# Patient Record
Sex: Male | Born: 1935 | Race: White | Hispanic: No | Smoking: Former smoker
Health system: Southern US, Community
[De-identification: ages and names within clinical notes are randomized; demographics above are authoritative.]

## PROBLEM LIST (undated history)

## (undated) DIAGNOSIS — R3129 Other microscopic hematuria: Secondary | ICD-10-CM

## (undated) DIAGNOSIS — B351 Tinea unguium: Secondary | ICD-10-CM

## (undated) DIAGNOSIS — N402 Nodular prostate without lower urinary tract symptoms: Secondary | ICD-10-CM

## (undated) DIAGNOSIS — N390 Urinary tract infection, site not specified: Secondary | ICD-10-CM

## (undated) DIAGNOSIS — I471 Supraventricular tachycardia, unspecified: Secondary | ICD-10-CM

## (undated) DIAGNOSIS — J189 Pneumonia, unspecified organism: Secondary | ICD-10-CM

## (undated) DIAGNOSIS — M1990 Unspecified osteoarthritis, unspecified site: Secondary | ICD-10-CM

## (undated) DIAGNOSIS — T300 Burn of unspecified body region, unspecified degree: Secondary | ICD-10-CM

## (undated) DIAGNOSIS — H16139 Photokeratitis, unspecified eye: Secondary | ICD-10-CM

## (undated) DIAGNOSIS — E538 Deficiency of other specified B group vitamins: Secondary | ICD-10-CM

## (undated) DIAGNOSIS — I739 Peripheral vascular disease, unspecified: Secondary | ICD-10-CM

## (undated) DIAGNOSIS — K5790 Diverticulosis of intestine, part unspecified, without perforation or abscess without bleeding: Secondary | ICD-10-CM

## (undated) DIAGNOSIS — N289 Disorder of kidney and ureter, unspecified: Secondary | ICD-10-CM

## (undated) DIAGNOSIS — I10 Essential (primary) hypertension: Secondary | ICD-10-CM

## (undated) DIAGNOSIS — I509 Heart failure, unspecified: Secondary | ICD-10-CM

## (undated) DIAGNOSIS — I2510 Atherosclerotic heart disease of native coronary artery without angina pectoris: Secondary | ICD-10-CM

## (undated) DIAGNOSIS — E559 Vitamin D deficiency, unspecified: Secondary | ICD-10-CM

## (undated) HISTORY — DX: Vitamin D deficiency, unspecified: E55.9

## (undated) HISTORY — PX: CORONARY ARTERY BYPASS GRAFT: SHX141

## (undated) HISTORY — DX: Nodular prostate without lower urinary tract symptoms: N40.2

## (undated) HISTORY — DX: Deficiency of other specified B group vitamins: E53.8

## (undated) HISTORY — DX: Supraventricular tachycardia, unspecified: I47.10

## (undated) HISTORY — DX: Diverticulosis of intestine, part unspecified, without perforation or abscess without bleeding: K57.90

## (undated) HISTORY — DX: Other microscopic hematuria: R31.29

## (undated) HISTORY — DX: Urinary tract infection, site not specified: N39.0

## (undated) HISTORY — PX: APPENDECTOMY: SHX54

## (undated) HISTORY — DX: Supraventricular tachycardia: I47.1

## (undated) HISTORY — DX: Tinea unguium: B35.1

## (undated) HISTORY — DX: Photokeratitis, unspecified eye: H16.139

## (undated) HISTORY — DX: Pneumonia, unspecified organism: J18.9

## (undated) HISTORY — DX: Peripheral vascular disease, unspecified: I73.9

## (undated) HISTORY — DX: Unspecified osteoarthritis, unspecified site: M19.90

---

## 1998-02-26 ENCOUNTER — Encounter (HOSPITAL_COMMUNITY): Admission: RE | Admit: 1998-02-26 | Discharge: 1998-05-27 | Payer: Self-pay | Admitting: Interventional Cardiology

## 2000-12-15 ENCOUNTER — Ambulatory Visit (HOSPITAL_COMMUNITY): Admission: RE | Admit: 2000-12-15 | Discharge: 2000-12-15 | Payer: Self-pay | Admitting: Interventional Cardiology

## 2001-03-24 ENCOUNTER — Ambulatory Visit (HOSPITAL_COMMUNITY): Admission: RE | Admit: 2001-03-24 | Discharge: 2001-03-24 | Payer: Self-pay | Admitting: Gastroenterology

## 2002-08-09 ENCOUNTER — Encounter: Payer: Self-pay | Admitting: Emergency Medicine

## 2002-08-09 ENCOUNTER — Emergency Department (HOSPITAL_COMMUNITY): Admission: EM | Admit: 2002-08-09 | Discharge: 2002-08-09 | Payer: Self-pay | Admitting: Emergency Medicine

## 2003-09-02 ENCOUNTER — Emergency Department (HOSPITAL_COMMUNITY): Admission: EM | Admit: 2003-09-02 | Discharge: 2003-09-02 | Payer: Self-pay | Admitting: Emergency Medicine

## 2004-09-15 ENCOUNTER — Emergency Department (HOSPITAL_COMMUNITY): Admission: EM | Admit: 2004-09-15 | Discharge: 2004-09-15 | Payer: Self-pay | Admitting: Family Medicine

## 2004-09-15 ENCOUNTER — Encounter (INDEPENDENT_AMBULATORY_CARE_PROVIDER_SITE_OTHER): Payer: Self-pay | Admitting: Specialist

## 2004-09-29 ENCOUNTER — Emergency Department (HOSPITAL_COMMUNITY): Admission: EM | Admit: 2004-09-29 | Discharge: 2004-09-29 | Payer: Self-pay | Admitting: Family Medicine

## 2005-05-28 ENCOUNTER — Emergency Department (HOSPITAL_COMMUNITY): Admission: EM | Admit: 2005-05-28 | Discharge: 2005-05-28 | Payer: Self-pay | Admitting: Emergency Medicine

## 2008-06-20 ENCOUNTER — Emergency Department (HOSPITAL_COMMUNITY): Admission: EM | Admit: 2008-06-20 | Discharge: 2008-06-20 | Payer: Self-pay | Admitting: Emergency Medicine

## 2008-06-28 ENCOUNTER — Ambulatory Visit (HOSPITAL_COMMUNITY): Admission: RE | Admit: 2008-06-28 | Discharge: 2008-06-28 | Payer: Self-pay | Admitting: Urology

## 2009-12-31 ENCOUNTER — Inpatient Hospital Stay (HOSPITAL_COMMUNITY): Admission: EM | Admit: 2009-12-31 | Discharge: 2010-01-03 | Payer: Self-pay | Admitting: Emergency Medicine

## 2010-01-01 ENCOUNTER — Encounter (INDEPENDENT_AMBULATORY_CARE_PROVIDER_SITE_OTHER): Payer: Self-pay | Admitting: Interventional Cardiology

## 2011-02-04 LAB — POCT I-STAT 3, VENOUS BLOOD GAS (G3P V)
Bicarbonate: 26.2 mEq/L — ABNORMAL HIGH (ref 20.0–24.0)
TCO2: 28 mmol/L (ref 0–100)
pCO2, Ven: 45.8 mmHg (ref 45.0–50.0)
pH, Ven: 7.364 — ABNORMAL HIGH (ref 7.250–7.300)

## 2011-02-04 LAB — COMPREHENSIVE METABOLIC PANEL
ALT: 27 U/L (ref 0–53)
ALT: 39 U/L (ref 0–53)
AST: 61 U/L — ABNORMAL HIGH (ref 0–37)
Albumin: 3 g/dL — ABNORMAL LOW (ref 3.5–5.2)
Albumin: 3.4 g/dL — ABNORMAL LOW (ref 3.5–5.2)
Alkaline Phosphatase: 44 U/L (ref 39–117)
Alkaline Phosphatase: 50 U/L (ref 39–117)
BUN: 19 mg/dL (ref 6–23)
Chloride: 104 mEq/L (ref 96–112)
Chloride: 104 mEq/L (ref 96–112)
Glucose, Bld: 95 mg/dL (ref 70–99)
Potassium: 3.1 mEq/L — ABNORMAL LOW (ref 3.5–5.1)
Potassium: 3.5 mEq/L (ref 3.5–5.1)
Sodium: 137 mEq/L (ref 135–145)
Sodium: 140 mEq/L (ref 135–145)
Total Bilirubin: 0.9 mg/dL (ref 0.3–1.2)
Total Bilirubin: 0.9 mg/dL (ref 0.3–1.2)
Total Protein: 5.7 g/dL — ABNORMAL LOW (ref 6.0–8.3)
Total Protein: 6.5 g/dL (ref 6.0–8.3)

## 2011-02-04 LAB — GLUCOSE, CAPILLARY
Glucose-Capillary: 115 mg/dL — ABNORMAL HIGH (ref 70–99)
Glucose-Capillary: 124 mg/dL — ABNORMAL HIGH (ref 70–99)
Glucose-Capillary: 127 mg/dL — ABNORMAL HIGH (ref 70–99)
Glucose-Capillary: 136 mg/dL — ABNORMAL HIGH (ref 70–99)
Glucose-Capillary: 146 mg/dL — ABNORMAL HIGH (ref 70–99)
Glucose-Capillary: 162 mg/dL — ABNORMAL HIGH (ref 70–99)
Glucose-Capillary: 99 mg/dL (ref 70–99)

## 2011-02-04 LAB — POCT I-STAT 3, ART BLOOD GAS (G3+)
Acid-Base Excess: 5 mmol/L — ABNORMAL HIGH (ref 0.0–2.0)
Bicarbonate: 30 mEq/L — ABNORMAL HIGH (ref 20.0–24.0)
O2 Saturation: 90 %
TCO2: 31 mmol/L (ref 0–100)
pCO2 arterial: 37.1 mmHg (ref 35.0–45.0)
pO2, Arterial: 57 mmHg — ABNORMAL LOW (ref 80.0–100.0)
pO2, Arterial: 81 mmHg (ref 80.0–100.0)

## 2011-02-04 LAB — CBC
HCT: 31.2 % — ABNORMAL LOW (ref 39.0–52.0)
HCT: 33.7 % — ABNORMAL LOW (ref 39.0–52.0)
HCT: 34.3 % — ABNORMAL LOW (ref 39.0–52.0)
Hemoglobin: 10.8 g/dL — ABNORMAL LOW (ref 13.0–17.0)
MCHC: 34.6 g/dL (ref 30.0–36.0)
MCV: 91.4 fL (ref 78.0–100.0)
Platelets: 176 10*3/uL (ref 150–400)
Platelets: 191 10*3/uL (ref 150–400)
RBC: 3.44 MIL/uL — ABNORMAL LOW (ref 4.22–5.81)
RDW: 13.5 % (ref 11.5–15.5)
WBC: 7.2 10*3/uL (ref 4.0–10.5)
WBC: 8.8 10*3/uL (ref 4.0–10.5)

## 2011-02-04 LAB — DIFFERENTIAL
Basophils Absolute: 0.1 10*3/uL (ref 0.0–0.1)
Basophils Relative: 1 % (ref 0–1)
Basophils Relative: 1 % (ref 0–1)
Eosinophils Absolute: 0 10*3/uL (ref 0.0–0.7)
Eosinophils Absolute: 0.1 10*3/uL (ref 0.0–0.7)
Eosinophils Relative: 1 % (ref 0–5)
Monocytes Absolute: 0.6 10*3/uL (ref 0.1–1.0)
Monocytes Absolute: 0.9 10*3/uL (ref 0.1–1.0)
Monocytes Relative: 12 % (ref 3–12)
Monocytes Relative: 8 % (ref 3–12)

## 2011-02-04 LAB — CK TOTAL AND CKMB (NOT AT ARMC)
CK, MB: 1 ng/mL (ref 0.3–4.0)
CK, MB: 1.3 ng/mL (ref 0.3–4.0)
Relative Index: INVALID (ref 0.0–2.5)
Total CK: 36 U/L (ref 7–232)
Total CK: 47 U/L (ref 7–232)

## 2011-02-04 LAB — LIPID PANEL
HDL: 44 mg/dL (ref >39)
Total CHOL/HDL Ratio: 2.5 RATIO
Triglycerides: 107 mg/dL (ref <150)
VLDL: 21 mg/dL (ref 0–40)

## 2011-02-04 LAB — BASIC METABOLIC PANEL WITH GFR
BUN: 21 mg/dL (ref 6–23)
CO2: 30 meq/L (ref 19–32)
Calcium: 8.9 mg/dL (ref 8.4–10.5)
Chloride: 104 meq/L (ref 96–112)
Creatinine, Ser: 1.03 mg/dL (ref 0.4–1.5)
GFR calc non Af Amer: >60 mL/min
Glucose, Bld: 122 mg/dL — ABNORMAL HIGH (ref 70–99)
Potassium: 4.7 meq/L (ref 3.5–5.1)
Sodium: 139 meq/L (ref 135–145)

## 2011-02-04 LAB — TROPONIN I: Troponin I: 0.02 ng/mL (ref 0.00–0.06)

## 2011-02-04 LAB — POCT CARDIAC MARKERS: CKMB, poc: <1 ng/mL — ABNORMAL LOW (ref 1.0–8.0)

## 2011-02-04 LAB — BASIC METABOLIC PANEL
BUN: 18 mg/dL (ref 6–23)
Chloride: 104 mEq/L (ref 96–112)
Glucose, Bld: 125 mg/dL — ABNORMAL HIGH (ref 70–99)
Potassium: 4.1 mEq/L (ref 3.5–5.1)

## 2011-02-04 LAB — BRAIN NATRIURETIC PEPTIDE: Pro B Natriuretic peptide (BNP): 438 pg/mL — ABNORMAL HIGH (ref 0.0–100.0)

## 2011-02-04 LAB — HEMOGLOBIN A1C: Hgb A1c MFr Bld: 6.5 % — ABNORMAL HIGH (ref 4.6–6.1)

## 2011-02-04 LAB — TSH: TSH: 1.177 u[IU]/mL (ref 0.350–4.500)

## 2011-04-03 NOTE — Procedures (Signed)
[place]  Patient:    Cole [name], Cole [name]                          MRN: [id number] Proc. Date: 03/24/01 Attending:  Mickeal Skinner, M.D. CC:         Henrine Screws, M.D., [facility]   Procedure Report  REFERRING PHYSICIAN:  Henrine Screws, M.D.  PROCEDURE:  Colonoscopy and polypectomy.  INDICATION:  Cole [name] (date of birth [birth year]) is a 77 year old  male.  He submitted six stool samples for hemoccult testing; two of six stool slides were positive for blood.  I discussed with Mr. Nivens the complications associated with colonoscopy and polypectomy including a 1:1000 risk of bleeding and 4:1000 risk of colon rupture requiring emergency surgery.  Mr. Vallier has signed the operative permit.  ENDOSCOPIST:  Mickeal Skinner, M.D.  PREMEDICATION:  Fentanyl 50 mg, Versed 5 mg.  ENDOSCOPE:  Olympus pediatric colonoscope.  DESCRIPTION OF PROCEDURE:  After obtaining informed consent, the patient was placed in the left lateral decubitus position.  I administered intravenous fentanyl and intravenous Versed to achieve conscious sedation for the procedure.  The patients blood pressure, oxygen saturation, and cardiac rhythm were monitored throughout the procedure and documented in the medical record.  Anal inspection was normal.  Digital rectal exam revealed a non-nodular prostate.  The Olympus pediatric colonoscope was introduced into the rectum and, under direct vision, advanced to the cecum.  Colonic preparation for the exam today was excellent.  Rectum: Normal.  Sigmoid Colon and Descending Colon: Left colonic diverticulosis.  Splenic Flexure: Normal.  Transverse Colon: Normal.  Hepatic Flexure: Normal.  Ascending Colon: Normal.  Cecum and Ileocecal Valve: Normal.  ASSESSMENT: 1. Left colonic diverticulosis. 2. Otherwise normal proctocolonoscopy to the cecum. DD:  03/24/01 TD:   03/24/01 Job: [id number]

## 2011-08-14 LAB — DIFFERENTIAL
Basophils Absolute: 0
Basophils Relative: 0
Eosinophils Absolute: 0.1
Eosinophils Relative: 0
Lymphocytes Relative: 6 — ABNORMAL LOW
Lymphs Abs: 0.9
Monocytes Absolute: 0.5
Monocytes Relative: 3
Neutro Abs: 14.9 — ABNORMAL HIGH
Neutrophils Relative %: 91 — ABNORMAL HIGH

## 2011-08-14 LAB — COMPREHENSIVE METABOLIC PANEL WITH GFR
ALT: 15
AST: 19
Albumin: 3.7
Alkaline Phosphatase: 45
Chloride: 102
GFR calc Af Amer: >60
Potassium: 3.9
Sodium: 137
Total Protein: 7.1

## 2011-08-14 LAB — URINALYSIS, ROUTINE W REFLEX MICROSCOPIC
Bilirubin Urine: NEGATIVE
Glucose, UA: 100 — AB
Ketones, ur: NEGATIVE
Leukocytes, UA: NEGATIVE
Nitrite: NEGATIVE
Protein, ur: NEGATIVE
Specific Gravity, Urine: 1.016
Urobilinogen, UA: 1
pH: 6.5

## 2011-08-14 LAB — COMPREHENSIVE METABOLIC PANEL
BUN: 21
CO2: 25
Calcium: 9
Creatinine, Ser: 0.97
GFR calc non Af Amer: >60
Glucose, Bld: 171 — ABNORMAL HIGH
Total Bilirubin: 1.1

## 2011-08-14 LAB — SAMPLE TO BLOOD BANK

## 2011-08-14 LAB — CBC
HCT: 39.9
Hemoglobin: 13.4
MCHC: 33.6
MCV: 89
Platelets: 206
RBC: 4.48
RDW: 13.5
WBC: 16.3 — ABNORMAL HIGH

## 2011-08-14 LAB — URINE CULTURE
Colony Count: NO GROWTH
Culture: NO GROWTH

## 2011-08-14 LAB — URINE MICROSCOPIC-ADD ON

## 2011-08-14 LAB — TROPONIN I: Troponin I: <0.01

## 2011-08-14 LAB — LIPASE, BLOOD: Lipase: 19

## 2012-01-19 ENCOUNTER — Emergency Department (HOSPITAL_COMMUNITY): Payer: No Typology Code available for payment source

## 2012-01-19 ENCOUNTER — Other Ambulatory Visit: Payer: Self-pay

## 2012-01-19 ENCOUNTER — Encounter (HOSPITAL_COMMUNITY): Payer: Self-pay | Admitting: Emergency Medicine

## 2012-01-19 ENCOUNTER — Emergency Department (HOSPITAL_COMMUNITY)
Admission: EM | Admit: 2012-01-19 | Discharge: 2012-01-20 | Disposition: A | Discharge status: Home / Self Care | Payer: No Typology Code available for payment source | Attending: Emergency Medicine | Admitting: Emergency Medicine

## 2012-01-19 DIAGNOSIS — I10 Essential (primary) hypertension: Secondary | ICD-10-CM | POA: Insufficient documentation

## 2012-01-19 DIAGNOSIS — E119 Type 2 diabetes mellitus without complications: Secondary | ICD-10-CM | POA: Insufficient documentation

## 2012-01-19 DIAGNOSIS — R51 Headache: Secondary | ICD-10-CM | POA: Insufficient documentation

## 2012-01-19 DIAGNOSIS — R002 Palpitations: Secondary | ICD-10-CM | POA: Insufficient documentation

## 2012-01-19 DIAGNOSIS — I2510 Atherosclerotic heart disease of native coronary artery without angina pectoris: Secondary | ICD-10-CM | POA: Insufficient documentation

## 2012-01-19 DIAGNOSIS — R42 Dizziness and giddiness: Secondary | ICD-10-CM | POA: Insufficient documentation

## 2012-01-19 DIAGNOSIS — G319 Degenerative disease of nervous system, unspecified: Secondary | ICD-10-CM | POA: Insufficient documentation

## 2012-01-19 DIAGNOSIS — Z79899 Other long term (current) drug therapy: Secondary | ICD-10-CM | POA: Insufficient documentation

## 2012-01-19 HISTORY — DX: Atherosclerotic heart disease of native coronary artery without angina pectoris: I25.10

## 2012-01-19 HISTORY — DX: Heart failure, unspecified: I50.9

## 2012-01-19 HISTORY — DX: Essential (primary) hypertension: I10

## 2012-01-19 LAB — DIFFERENTIAL
Basophils Relative: 1 % (ref 0–1)
Eosinophils Absolute: 0.1 10*3/uL (ref 0.0–0.7)
Eosinophils Relative: 1 % (ref 0–5)
Lymphs Abs: 1.8 10*3/uL (ref 0.7–4.0)
Monocytes Relative: 9 % (ref 3–12)

## 2012-01-19 LAB — BASIC METABOLIC PANEL
BUN: 20 mg/dL (ref 6–23)
Calcium: 9.5 mg/dL (ref 8.4–10.5)
GFR calc Af Amer: 67 mL/min — ABNORMAL LOW (ref >90)
GFR calc non Af Amer: 58 mL/min — ABNORMAL LOW (ref >90)
Glucose, Bld: 97 mg/dL (ref 70–99)

## 2012-01-19 LAB — CBC
Hemoglobin: 12.2 g/dL — ABNORMAL LOW (ref 13.0–17.0)
MCH: 29.5 pg (ref 26.0–34.0)
MCHC: 33.7 g/dL (ref 30.0–36.0)
MCV: 87.4 fL (ref 78.0–100.0)
Platelets: 217 10*3/uL (ref 150–400)
RBC: 4.14 MIL/uL — ABNORMAL LOW (ref 4.22–5.81)

## 2012-01-19 NOTE — ED Notes (Signed)
Patient returned from CT

## 2012-01-19 NOTE — ED Notes (Signed)
[id number] Expected date:01/19/12<BR> Expected time: 7:27 PM<BR> Means of arrival:Ambulance<BR> Comments:<BR> EMS 63 GC. 78 y/o male with only complaint of unusual feeling in head. He is c/a, vitals wnl. He is ambulatory

## 2012-01-19 NOTE — ED Notes (Signed)
Pt states his head feels better now. States he never had a headache, but was sort of dizzy. Pt denies, nausea, blurred vision, dizziness at present. Remains a/o x 3. Pt states he was afraid to drive earlier due to his symptoms.

## 2012-01-19 NOTE — ED Notes (Signed)
Pt states he has an abnormal sensation in head. Denies headache. Pt thinks his blood pressure may be high. Denies nausea and weakness. Pt ambulated into ED.

## 2012-01-19 NOTE — ED Provider Notes (Signed)
History     CSN: [id number]  Arrival date & time 01/19/12  1944   First MD Initiated Contact with Patient 01/19/12 2044      Chief Complaint  Patient presents with  . Headache    Abnormal sensation in head     HPI  History provided by the patient. Patient is a 78-year-old male with history of hypertension, diabetes, coronary artery disease and CHF who presents with complaints of vague headache and unusual feeling in head. Patient states he does have significant pains but feels a light headedness sensation in his head with unusual feeling. Patient also reports having a slight episode of shortness of breath and heart racing earlier today that lasted less than 30 minutes. Patient states his head symptoms have continued. He denies feeling dizziness or vertigo. He denies any vision changes, speech changes, confusion or focal neurologic deficits and extremities. He denies having similar symptoms previously. He denies any aggravating or alleviating symptoms. Symptoms are described as mild.     Past Medical History  Diagnosis Date  . Hypertension   . CHF (congestive heart failure)   . Diabetes mellitus   . Coronary artery disease     History reviewed. No pertinent past surgical history.  No family history on file.  History  Substance Use Topics  . Smoking status: Not on file  . Smokeless tobacco: Not on file  . Alcohol Use:       Review of Systems  Constitutional: Negative for fever, chills, diaphoresis, appetite change and fatigue.  HENT: Negative for tinnitus.   Respiratory: Negative for cough.   Cardiovascular: Positive for palpitations. Negative for chest pain.  Gastrointestinal: Negative for abdominal pain.  Neurological: Positive for light-headedness. Negative for dizziness, syncope, facial asymmetry, speech difficulty, weakness, numbness and headaches.  All other systems reviewed and are negative.    Allergies  Review of patient's allergies indicates no known  allergies.  Home Medications   Current Outpatient Rx  Name Route Sig Dispense Refill  . AMLODIPINE BESYLATE 5 MG PO TABS Oral Take 5 mg by mouth daily.    . ASPIRIN 325 MG PO TBEC Oral Take 325 mg by mouth daily.    Marland Kitchen VITAMIN D 1000 UNITS PO TABS Oral Take 1,000 Units by mouth daily.    Marland Kitchen EZETIMIBE-SIMVASTATIN 10-40 MG PO TABS Oral Take 1 tablet by mouth at bedtime.    . FUROSEMIDE 40 MG PO TABS Oral Take 40 mg by mouth daily.    . GLYBURIDE-METFORMIN 2.5-500 MG PO TABS Oral Take 1 tablet by mouth 2 (two) times daily with a meal.    . LATANOPROST 0.005 % OP SOLN Both Eyes Place 1 drop into both eyes at bedtime.    Marland Kitchen LISINOPRIL 10 MG PO TABS Oral Take 20 mg by mouth daily.    Marland Kitchen METOPROLOL TARTRATE 50 MG PO TABS Oral Take 50 mg by mouth 2 (two) times daily.    Marland Kitchen TAMSULOSIN HCL 0.4 MG PO CAPS Oral Take 0.4 mg by mouth at bedtime.    Marland Kitchen VITAMIN B-12 1000 MCG PO TABS Oral Take 1,000 mcg by mouth daily.      BP 153/71  Pulse 92  Temp(Src) 99.1 F (37.3 C) (Oral)  Resp 16  SpO2 97%  Physical Exam  Nursing note and vitals reviewed. Constitutional: He is oriented to person, place, and time. He appears well-developed and well-nourished. No distress.  HENT:  Head: Normocephalic and atraumatic.  Mouth/Throat: Oropharynx is clear and moist.  Eyes: Conjunctivae  and EOM are normal. Pupils are equal, round, and reactive to light.  Neck: Normal range of motion. Neck supple. No tracheal deviation present.  Cardiovascular: Normal rate and regular rhythm.   Pulmonary/Chest: Effort normal and breath sounds normal. No respiratory distress. He has no wheezes. He has no rales.  Abdominal: Soft. There is no tenderness. There is no rebound and no guarding.  Neurological: He is alert and oriented to person, place, and time. He has normal strength. No cranial nerve deficit or sensory deficit. Coordination and gait normal.  Skin: Skin is warm.  Psychiatric: He has a normal mood and affect. His behavior is  normal.    ED Course  Procedures   Results for orders placed during the hospital encounter of 01/19/12  CBC      Component Value Range   WBC 8.5  4.0 - 10.5 (K/uL)   RBC 4.14 (*) 4.22 - 5.81 (MIL/uL)   Hemoglobin 12.2 (*) 13.0 - 17.0 (g/dL)   HCT 36.2 (*) 39.0 - 52.0 (%)   MCV 87.4  78.0 - 100.0 (fL)   MCH 29.5  26.0 - 34.0 (pg)   MCHC 33.7  30.0 - 36.0 (g/dL)   RDW 13.0  11.5 - 15.5 (%)   Platelets 217  150 - 400 (K/uL)  DIFFERENTIAL      Component Value Range   Neutrophils Relative 68  43 - 77 (%)   Neutro Abs 5.8  1.7 - 7.7 (K/uL)   Lymphocytes Relative 22  12 - 46 (%)   Lymphs Abs 1.8  0.7 - 4.0 (K/uL)   Monocytes Relative 9  3 - 12 (%)   Monocytes Absolute 0.7  0.1 - 1.0 (K/uL)   Eosinophils Relative 1  0 - 5 (%)   Eosinophils Absolute 0.1  0.0 - 0.7 (K/uL)   Basophils Relative 1  0 - 1 (%)   Basophils Absolute 0.1  0.0 - 0.1 (K/uL)  BASIC METABOLIC PANEL      Component Value Range   Sodium 141  135 - 145 (mEq/L)   Potassium 3.5  3.5 - 5.1 (mEq/L)   Chloride 102  96 - 112 (mEq/L)   CO2 29  19 - 32 (mEq/L)   Glucose, Bld 97  70 - 99 (mg/dL)   BUN 20  6 - 23 (mg/dL)   Creatinine, Ser 1.09  0.50 - 1.35 (mg/dL)   Calcium 9.5  8.4 - 10.5 (mg/dL)   GFR calc non Af Amer 58 (*) >90 (mL/min)   GFR calc Af Amer 67 (*) >90 (mL/min)  GLUCOSE, CAPILLARY      Component Value Range   Glucose-Capillary 106 (*) 70 - 99 (mg/dL)   Comment 1 Notify RN     Comment 2 Documented in Chart    TROPONIN I      Component Value Range   Troponin I <0.30  <0.30 (ng/mL)      Ct Head Wo Contrast  01/19/2012  *RADIOLOGY REPORT*  Clinical Data: Headache, dizziness  CT HEAD WITHOUT CONTRAST  Technique:  Contiguous axial images were obtained from the base of the skull through the vertex without contrast.  Comparison: None.  Findings: Diffuse brain atrophy evident with periventricular white matter chronic ischemic changes throughout the cerebrum.  No acute intracranial hemorrhage, mass lesion,  definite infarction, focal edema, midline shift, herniation, hydrocephalus, or extra-axial fluid collection.  Gray-white matter differentiation maintained. Cisterns patent.  No cerebellar abnormality.  Orbits are symmetric. The mastoids and sinuses are clear.  IMPRESSION: Atrophy  and microvascular ischemic changes.  No acute finding by noncontrast CT.  Original Report Authenticated By: Jerilynn Mages. Daryll Brod, M.D.     1. Lightheadedness   2. Headache       MDM  8:45PM patient seen and evaluated. Patient in no acute distress. Patient is well-appearing.  Patient seen and discussed with attending physician. She agrees with workup and treatment plan. Plan to refer patient to cardiologist for EKG. She agrees with this plan.    Date: 01/20/2012  Rate: 88  Rhythm: Atrial fibrillation  QRS Axis: indeterminate  Intervals: normal  ST/T Wave abnormalities: normal  Conduction Disutrbances:none  Narrative Interpretation: LVH with secondary repolarization abnormality  Old EKG Reviewed: unchanged from 01/01/2010     Martie Lee, PA 01/20/12 [phone]

## 2012-01-20 NOTE — ED Provider Notes (Signed)
Medical screening examination/treatment/procedure(s) were conducted as a shared visit with non-physician practitioner(s) and myself.  I personally evaluated the patient during the encounter  Patient c/o vague lightheadedness "vacuum like sensation" in his head. No other complaints incl no slurred speech/numbness/tingling/weakness of extremities. Neuro exam unremarkable. Denies cp/sob. No known h/o afib. Rate controlled, Will refer to cardiology. Do not suspect stroke related sx related to afib/flutter. Given strict precautions for return.    Blair Heys, MD 01/20/12 1510

## 2012-01-20 NOTE — Discharge Instructions (Signed)
Your lab tests, x-rays and CAT scan have not shown any concerning findings to explain your symptoms. At this time your providers feel you're safe to return home and followup to primary care provider. If you develop any worsening symptoms please return to the emergency room.  Headache, General, Unknown Cause The specific cause of your headache may not have been found today. There are many causes and types of headache. A few common ones are:  Tension headache.   Migraine.   Infections (examples: dental and sinus infections).   Bone and/or joint problems in the neck or jaw.   Depression.   Eye problems.  These headaches are not life threatening.  Headaches can sometimes be diagnosed by a patient history and a physical exam. Sometimes, lab and imaging studies (such as x-ray and/or CT scan) are used to rule out more serious problems. In some cases, a spinal tap (lumbar puncture) may be requested. There are many times when your exam and tests may be normal on the first visit even when there is a serious problem causing your headaches. Because of that, it is very important to follow up with your doctor or local clinic for further evaluation. FINDING OUT THE RESULTS OF TESTS  If a radiology test was performed, a radiologist will review your results.   You will be contacted by the emergency department or your physician if any test results require a change in your treatment plan.   Not all test results may be available during your visit. If your test results are not back during the visit, make an appointment with your caregiver to find out the results. Do not assume everything is normal if you have not heard from your caregiver or the medical facility. It is important for you to follow up on all of your test results.  HOME CARE INSTRUCTIONS   Keep follow-up appointments with your caregiver, or any specialist referral.   Only take over-the-counter or prescription medicines for pain, discomfort, or  fever as directed by your caregiver.   Biofeedback, massage, or other relaxation techniques may be helpful.   Ice packs or heat applied to the head and neck can be used. Do this three to four times per day, or as needed.   Call your doctor if you have any questions or concerns.   If you smoke, you should quit.  SEEK MEDICAL CARE IF:   You develop problems with medications prescribed.   You do not respond to or obtain relief from medications.   You have a change from the usual headache.   You develop nausea or vomiting.  SEEK IMMEDIATE MEDICAL CARE IF:   If your headache becomes severe.   You have an unexplained oral temperature above 102 F (38.9 C), or as your caregiver suggests.   You have a stiff neck.   You have loss of vision.   You have muscular weakness.   You have loss of muscular control.   You develop severe symptoms different from your first symptoms.   You start losing your balance or have trouble walking.   You feel faint or pass out.  MAKE SURE YOU:   Understand these instructions.   Will watch your condition.   Will get help right away if you are not doing well or get worse.  Document Released: 11/02/2005 Document Revised: 10/22/2011 Document Reviewed: 06/21/2008 [facility] Patient Information 2012 [place].

## 2013-09-25 ENCOUNTER — Encounter: Payer: Self-pay | Admitting: Interventional Cardiology

## 2013-12-12 ENCOUNTER — Encounter: Payer: Self-pay | Admitting: *Deleted

## 2013-12-12 ENCOUNTER — Encounter: Payer: Self-pay | Admitting: Interventional Cardiology

## 2013-12-12 DIAGNOSIS — H16139 Photokeratitis, unspecified eye: Secondary | ICD-10-CM | POA: Insufficient documentation

## 2013-12-12 DIAGNOSIS — K5790 Diverticulosis of intestine, part unspecified, without perforation or abscess without bleeding: Secondary | ICD-10-CM | POA: Insufficient documentation

## 2013-12-12 DIAGNOSIS — I5042 Chronic combined systolic (congestive) and diastolic (congestive) heart failure: Secondary | ICD-10-CM | POA: Insufficient documentation

## 2013-12-12 DIAGNOSIS — E538 Deficiency of other specified B group vitamins: Secondary | ICD-10-CM | POA: Insufficient documentation

## 2013-12-12 DIAGNOSIS — M1990 Unspecified osteoarthritis, unspecified site: Secondary | ICD-10-CM | POA: Insufficient documentation

## 2013-12-12 DIAGNOSIS — I739 Peripheral vascular disease, unspecified: Secondary | ICD-10-CM | POA: Insufficient documentation

## 2013-12-12 DIAGNOSIS — I471 Supraventricular tachycardia: Secondary | ICD-10-CM | POA: Insufficient documentation

## 2013-12-12 DIAGNOSIS — N402 Nodular prostate without lower urinary tract symptoms: Secondary | ICD-10-CM | POA: Insufficient documentation

## 2013-12-12 DIAGNOSIS — E559 Vitamin D deficiency, unspecified: Secondary | ICD-10-CM | POA: Insufficient documentation

## 2013-12-12 DIAGNOSIS — B351 Tinea unguium: Secondary | ICD-10-CM | POA: Insufficient documentation

## 2013-12-12 DIAGNOSIS — R3129 Other microscopic hematuria: Secondary | ICD-10-CM | POA: Insufficient documentation

## 2013-12-12 DIAGNOSIS — I25709 Atherosclerosis of coronary artery bypass graft(s), unspecified, with unspecified angina pectoris: Secondary | ICD-10-CM | POA: Insufficient documentation

## 2013-12-20 ENCOUNTER — Ambulatory Visit: Payer: No Typology Code available for payment source | Admitting: Interventional Cardiology

## 2013-12-21 ENCOUNTER — Encounter: Payer: Self-pay | Admitting: Interventional Cardiology

## 2013-12-21 ENCOUNTER — Ambulatory Visit (INDEPENDENT_AMBULATORY_CARE_PROVIDER_SITE_OTHER): Payer: No Typology Code available for payment source | Admitting: Interventional Cardiology

## 2013-12-21 VITALS — BP 178/74 | HR 86 | Ht 69.5 in | Wt 190.0 lb

## 2013-12-21 DIAGNOSIS — I10 Essential (primary) hypertension: Secondary | ICD-10-CM | POA: Insufficient documentation

## 2013-12-21 DIAGNOSIS — I5032 Chronic diastolic (congestive) heart failure: Secondary | ICD-10-CM

## 2013-12-21 DIAGNOSIS — I2510 Atherosclerotic heart disease of native coronary artery without angina pectoris: Secondary | ICD-10-CM

## 2013-12-21 MED ORDER — ROSUVASTATIN CALCIUM 5 MG PO TABS: 5.0000 mg | ORAL_TABLET | ORAL | Status: DC

## 2013-12-21 NOTE — Patient Instructions (Signed)
Your physician recommends that you continue on your current medications as directed. Please refer to the Current Medication list given to you today.  Your physician wants you to follow-up in: 1 year. You will receive a reminder letter in the mail two months in advance. If you don't receive a letter, please call our office to schedule the follow-up appointment.  

## 2013-12-21 NOTE — Progress Notes (Signed)
Patient ID: ANA KLEBE, male   DOB: January 24, 1920, 80 y.o.   MRN: [id number]    [address] [place], [place]  [address] Phone: [phone] Fax:  [phone]  Date:  12/21/2013   ID:  Elwin, Hollands 1920-04-25, MRN [id number]  PCP:  No primary provider on file.   ASSESSMENT:  1. Coronary artery disease, stable without angina 2. Hypertension, controlled 3. Diastolic heart failure, controlled  PLAN:  1. clinical followup in one year 2. Currently taking Crestor 5 mg Monday Wednesday and Friday for cholesterol to   SUBJECTIVE: HILERY WALDBILLIG is a 80 y.o. male as orthopedic complaints but otherwise doing well. He has not had angina, palpitations, dyspnea, or syncope to   Wt Readings from Last 3 Encounters:  12/21/13 190 lb (86.183 kg)     Past Medical History  Diagnosis Date  . Hypertension     hypertensive syndrome with dyspnea -[facility], February, 2011 - EF 50% and cardiac bypass grafts all patent - responded to diuretics and blood pressure control - [name]  . CHF (congestive heart failure)   . Diabetes mellitus   . Coronary artery disease     status post CABG, 1999  . Vitamin D deficiency   . Actinic keratitis   . Onychomycosis   . Vitamin B12 deficiency   . Claudication   . Prostate nodule   . PSVT (paroxysmal supraventricular tachycardia)   . Microscopic hematuria   . Diverticulosis   . Prostate nodule   . DJD (degenerative joint disease)     Current Outpatient Prescriptions  Medication Sig Dispense Refill  . amLODipine (NORVASC) 5 MG tablet Take 5 mg by mouth daily.      Marland Kitchen aspirin 325 MG EC tablet Take 325 mg by mouth daily.      . cholecalciferol (VITAMIN D) 1000 UNITS tablet Take 1,000 Units by mouth daily.      Marland Kitchen ezetimibe-simvastatin (VYTORIN) 10-40 MG per tablet Take 1 tablet by mouth at bedtime.      . furosemide (LASIX) 40 MG tablet Take 40 mg by mouth daily.      Marland Kitchen glyBURIDE-metformin (GLUCOVANCE)  2.5-500 MG per tablet Take 1 tablet by mouth 2 (two) times daily with a meal.      . latanoprost (XALATAN) 0.005 % ophthalmic solution Place 1 drop into both eyes at bedtime.      Marland Kitchen lisinopril (PRINIVIL,ZESTRIL) 10 MG tablet Take 20 mg by mouth daily.      . metoprolol (LOPRESSOR) 50 MG tablet Take 50 mg by mouth 2 (two) times daily.      . Tamsulosin HCl (FLOMAX) 0.4 MG CAPS Take 0.4 mg by mouth at bedtime.      . vitamin B-12 (CYANOCOBALAMIN) 1000 MCG tablet Take 1,000 mcg by mouth daily.       No current facility-administered medications for this visit.    Allergies:   No Known Allergies  Social History:  The patient  reports that he has quit smoking. He does not have any smokeless tobacco history on file.   ROS:  Please see the history of present illness.      All other systems reviewed and negative.   OBJECTIVE: VS:  BP 178/74  Pulse 86  Ht 5' 9.5" (1.765 m)  Wt 190 lb (86.183 kg)  BMI 27.67 kg/m2 Well nourished, well developed, in no acute distress, elderly HEENT: normal Neck: JVD flat. Carotid bruit absent  Cardiac:  normal S1, S2; RRR; no murmur Lungs:  clear to auscultation bilaterally, no wheezing, rhonchi or rales Abd: soft, nontender, no hepatomegaly Ext: Edema absent. Pulses 2+ and symmetric Skin: warm and dry Neuro:  CNs 2-12 intact, no focal abnormalities noted  EKG:  Normal sinus rhythm with first-degree AV block and old inferior infarct       Signed, Illene Labrador III, MD 12/21/2013 1:30 PM

## 2014-01-19 ENCOUNTER — Other Ambulatory Visit: Payer: Self-pay

## 2014-01-19 MED ORDER — METOPROLOL TARTRATE 50 MG PO TABS: 50.0000 mg | ORAL_TABLET | Freq: Two times a day (BID) | ORAL | Status: DC

## 2014-03-30 ENCOUNTER — Emergency Department (HOSPITAL_COMMUNITY)
Admission: EM | Admit: 2014-03-30 | Discharge: 2014-03-30 | Disposition: A | Discharge status: Home / Self Care | Payer: Medicare Other | Source: Home / Self Care

## 2014-03-30 ENCOUNTER — Encounter (HOSPITAL_COMMUNITY): Payer: Self-pay | Admitting: Emergency Medicine

## 2014-03-30 DIAGNOSIS — N390 Urinary tract infection, site not specified: Secondary | ICD-10-CM

## 2014-03-30 LAB — POCT URINALYSIS DIP (DEVICE)
Bilirubin Urine: NEGATIVE
GLUCOSE, UA: NEGATIVE mg/dL
Ketones, ur: NEGATIVE mg/dL
NITRITE: POSITIVE — AB
PROTEIN: 100 mg/dL — AB
Specific Gravity, Urine: 1.02 (ref 1.005–1.030)
UROBILINOGEN UA: 0.2 mg/dL (ref 0.0–1.0)
pH: 5.5 (ref 5.0–8.0)

## 2014-03-30 MED ORDER — CEPHALEXIN 500 MG PO CAPS: 500.0000 mg | ORAL_CAPSULE | Freq: Four times a day (QID) | ORAL | Status: DC

## 2014-03-30 NOTE — Discharge Instructions (Signed)
Urinary Tract Infection Get AZO Standard at drug store Urinary tract infections (UTIs) can develop anywhere along your urinary tract. Your urinary tract is your body's drainage system for removing wastes and extra water. Your urinary tract includes two kidneys, two ureters, a bladder, and a urethra. Your kidneys are a pair of bean-shaped organs. Each kidney is about the size of your fist. They are located below your ribs, one on each side of your spine. CAUSES Infections are caused by microbes, which are microscopic organisms, including fungi, viruses, and bacteria. These organisms are so small that they can only be seen through a microscope. Bacteria are the microbes that most commonly cause UTIs. SYMPTOMS  Symptoms of UTIs may vary by age and gender of the patient and by the location of the infection. Symptoms in young women typically include a frequent and intense urge to urinate and a painful, burning feeling in the bladder or urethra during urination. Older women and men are more likely to be tired, shaky, and weak and have muscle aches and abdominal pain. A fever may mean the infection is in your kidneys. Other symptoms of a kidney infection include pain in your back or sides below the ribs, nausea, and vomiting. DIAGNOSIS To diagnose a UTI, your caregiver will ask you about your symptoms. Your caregiver also will ask to provide a urine sample. The urine sample will be tested for bacteria and white blood cells. White blood cells are made by your body to help fight infection. TREATMENT  Typically, UTIs can be treated with medication. Because most UTIs are caused by a bacterial infection, they usually can be treated with the use of antibiotics. The choice of antibiotic and length of treatment depend on your symptoms and the type of bacteria causing your infection. HOME CARE INSTRUCTIONS  If you were prescribed antibiotics, take them exactly as your caregiver instructs you. Finish the medication even  if you feel better after you have only taken some of the medication.  Drink enough water and fluids to keep your urine clear or pale yellow.  Avoid caffeine, tea, and carbonated beverages. They tend to irritate your bladder.  Empty your bladder often. Avoid holding urine for long periods of time.  Empty your bladder before and after sexual intercourse.  After a bowel movement, women should cleanse from front to back. Use each tissue only once. SEEK MEDICAL CARE IF:   You have back pain.  You develop a fever.  Your symptoms do not begin to resolve within 3 days. SEEK IMMEDIATE MEDICAL CARE IF:   You have severe back pain or lower abdominal pain.  You develop chills.  You have nausea or vomiting.  You have continued burning or discomfort with urination. MAKE SURE YOU:   Understand these instructions.  Will watch your condition.  Will get help right away if you are not doing well or get worse. Document Released: 08/12/2005 Document Revised: 05/03/2012 Document Reviewed: 12/11/2011 [facility] Patient Information 2014 [place].

## 2014-03-30 NOTE — ED Provider Notes (Signed)
CSN: [id number]     Arrival date & time 03/30/14  1250 History   First MD Initiated Contact with Patient 03/30/14 1445     Chief Complaint  Patient presents with  . Hematuria   (Consider location/radiation/quality/duration/timing/severity/associated sxs/prior Treatment) HPI Comments: 80 year old male complaining of trouble with urination. He endorses urinary urgency, leaking after voiding and gross hematuria.  Patient is a 80 y.o. male presenting with hematuria.  Hematuria    Past Medical History  Diagnosis Date  . Hypertension     hypertensive syndrome with dyspnea -[facility], February, 2011 - EF 50% and cardiac bypass grafts all patent - responded to diuretics and blood pressure control - [name]  . CHF (congestive heart failure)   . Diabetes mellitus   . Coronary artery disease     status post CABG, 1999  . Vitamin D deficiency   . Actinic keratitis   . Onychomycosis   . Vitamin B12 deficiency   . Claudication   . Prostate nodule   . PSVT (paroxysmal supraventricular tachycardia)   . Microscopic hematuria   . Diverticulosis   . Prostate nodule   . DJD (degenerative joint disease)    Past Surgical History  Procedure Laterality Date  . Coronary artery bypass graft    . Appendectomy     History reviewed. No pertinent family history. History  Substance Use Topics  . Smoking status: Former Research scientist (life sciences)  . Smokeless tobacco: Not on file  . Alcohol Use: No    Review of Systems  Constitutional: Negative.   Genitourinary: Positive for dysuria and hematuria. Negative for discharge, scrotal swelling and testicular pain.    Allergies  Review of patient's allergies indicates no known allergies.  Home Medications   Prior to Admission medications   Medication Sig Start Date End Date Taking? Authorizing Provider  amLODipine (NORVASC) 5 MG tablet Take 5 mg by mouth daily.   Yes Historical Provider, MD  aspirin 325 MG EC tablet Take 325 mg by mouth  daily.   Yes Historical Provider, MD  cholecalciferol (VITAMIN D) 1000 UNITS tablet Take 1,000 Units by mouth daily.   Yes Historical Provider, MD  ezetimibe-simvastatin (VYTORIN) 10-40 MG per tablet Take 1 tablet by mouth at bedtime.   Yes Historical Provider, MD  furosemide (LASIX) 40 MG tablet Take 40 mg by mouth daily.   Yes Historical Provider, MD  glyBURIDE-metformin (GLUCOVANCE) 2.5-500 MG per tablet Take 1 tablet by mouth 2 (two) times daily with a meal.   Yes Historical Provider, MD  latanoprost (XALATAN) 0.005 % ophthalmic solution Place 1 drop into both eyes at bedtime.   Yes Historical Provider, MD  lisinopril (PRINIVIL,ZESTRIL) 10 MG tablet Take 20 mg by mouth daily.   Yes Historical Provider, MD  metoprolol (LOPRESSOR) 50 MG tablet Take 1 tablet (50 mg total) by mouth 2 (two) times daily. 01/19/14  Yes Belva Crome III, MD  rosuvastatin (CRESTOR) 5 MG tablet Take 1 tablet (5 mg total) by mouth 3 (three) times a week. Take 1 tablet Mon, Wed, Fri 12/21/13  Yes Belva Crome III, MD  Tamsulosin HCl (FLOMAX) 0.4 MG CAPS Take 0.4 mg by mouth at bedtime.   Yes Historical Provider, MD  vitamin B-12 (CYANOCOBALAMIN) 1000 MCG tablet Take 1,000 mcg by mouth daily.   Yes Historical Provider, MD  cephALEXin (KEFLEX) 500 MG capsule Take 1 capsule (500 mg total) by mouth 4 (four) times daily. 03/30/14   Janne Napoleon, NP   BP 125/56  Pulse 68  Temp(Src) 98.1 F (36.7 C) (Oral)  Resp 12  SpO2 100% Physical Exam  Nursing note and vitals reviewed. Constitutional: He is oriented to person, place, and time. He appears well-developed and well-nourished. No distress.  Neck: Normal range of motion. Neck supple.  Pulmonary/Chest: He is in respiratory distress.  Neurological: He is alert and oriented to person, place, and time.  Skin: Skin is warm and dry.    ED Course  Procedures (including critical care time) Labs Review Labs Reviewed  POCT URINALYSIS DIP (DEVICE) - Abnormal; Notable for the  following:    Hgb urine dipstick MODERATE (*)    Protein, ur 100 (*)    Nitrite POSITIVE (*)    Leukocytes, UA MODERATE (*)    All other components within normal limits  URINE CULTURE    Imaging Review No results found.   MDM   1. UTI (lower urinary tract infection)    Keflex and pyridium Lots of water Urine cult pending    Janne Napoleon, NP 03/30/14 1557

## 2014-03-30 NOTE — ED Notes (Signed)
Reports hematuria x 2 days.  Bladder pressure.   Denies fever, n/v/d   No otc meds taken.

## 2014-04-01 NOTE — ED Provider Notes (Signed)
Medical screening examination/treatment/procedure(s) were performed by resident physician or non-physician practitioner and as supervising physician I was immediately available for consultation/collaboration.   Pauline Good MD.   Billy Fischer, MD 04/01/14 [phone]

## 2014-08-29 ENCOUNTER — Other Ambulatory Visit: Payer: Self-pay | Admitting: Interventional Cardiology

## 2014-11-16 DIAGNOSIS — T300 Burn of unspecified body region, unspecified degree: Secondary | ICD-10-CM

## 2014-11-16 HISTORY — DX: Burn of unspecified body region, unspecified degree: T30.0

## 2015-01-15 ENCOUNTER — Ambulatory Visit (INDEPENDENT_AMBULATORY_CARE_PROVIDER_SITE_OTHER): Payer: Medicare Other | Admitting: Interventional Cardiology

## 2015-01-15 ENCOUNTER — Encounter: Payer: Self-pay | Admitting: Interventional Cardiology

## 2015-01-15 VITALS — BP 152/76 | HR 85 | Ht 69.5 in | Wt 185.0 lb

## 2015-01-15 DIAGNOSIS — I25700 Atherosclerosis of coronary artery bypass graft(s), unspecified, with unstable angina pectoris: Secondary | ICD-10-CM

## 2015-01-15 DIAGNOSIS — I5032 Chronic diastolic (congestive) heart failure: Secondary | ICD-10-CM

## 2015-01-15 DIAGNOSIS — I471 Supraventricular tachycardia: Secondary | ICD-10-CM

## 2015-01-15 DIAGNOSIS — I10 Essential (primary) hypertension: Secondary | ICD-10-CM

## 2015-01-15 DIAGNOSIS — I739 Peripheral vascular disease, unspecified: Secondary | ICD-10-CM

## 2015-01-15 NOTE — Patient Instructions (Signed)
Your physician recommends that you continue on your current medications as directed. Please refer to the Current Medication list given to you today.  Your physician wants you to follow-up in: 1 year with Dr.Smith You will receive a reminder letter in the mail two months in advance. If you don't receive a letter, please call our office to schedule the follow-up appointment.  

## 2015-01-15 NOTE — Progress Notes (Signed)
Cardiology Office Note   Date:  01/15/2015   ID:  Cole [name], DOB May 22, 1920, MRN [id number]  PCP:  Henrine Screws, MD  Cardiologist:   Sinclair Grooms, MD   Chief Complaint  Patient presents with  . Coronary Artery Disease      History of Present Illness: Cole [name] is a 81 y.o. male who presents for CAD and CABG f/u. He denies palpitations and syncope. He has no orthopnea.    Past Medical History  Diagnosis Date  . Hypertension     hypertensive syndrome with dyspnea -[name], February, 2011 - EF 50% and cardiac bypass grafts all patent - responded to diuretics and blood pressure control - [name]  . CHF (congestive heart failure)   . Diabetes mellitus   . Coronary artery disease     status post CABG, 1999  . Vitamin D deficiency   . Actinic keratitis   . Onychomycosis   . Vitamin B12 deficiency   . Claudication   . Prostate nodule   . PSVT (paroxysmal supraventricular tachycardia)   . Microscopic hematuria   . Diverticulosis   . Prostate nodule   . DJD (degenerative joint disease)     Past Surgical History  Procedure Laterality Date  . Coronary artery bypass graft    . Appendectomy       Current Outpatient Prescriptions  Medication Sig Dispense Refill  . amLODipine (NORVASC) 5 MG tablet Take 5 mg by mouth daily.    [place] Kitchen aspirin 325 MG EC tablet Take 325 mg by mouth daily.    . cholecalciferol (VITAMIN D) 1000 UNITS tablet Take 1,000 Units by mouth daily.    [place] Kitchen ezetimibe-simvastatin (VYTORIN) 10-40 MG per tablet Take 1 tablet by mouth at bedtime.    . furosemide (LASIX) 40 MG tablet Take 40 mg by mouth daily.    [place] Kitchen glipiZIDE-metformin (METAGLIP) 2.5-500 MG per tablet Take 1 tablet by mouth 2 (two) times daily with a meal.   11  . latanoprost (XALATAN) 0.005 % ophthalmic solution Place 1 drop into both eyes at bedtime.    [place] Kitchen lisinopril (PRINIVIL,ZESTRIL) 10 MG tablet Take 20 mg by mouth daily.    . metoprolol  (LOPRESSOR) 50 MG tablet TAKE 1 TABLET (50 MG TOTAL) BY MOUTH 2 (TWO) TIMES DAILY. 60 tablet 11  . rosuvastatin (CRESTOR) 5 MG tablet Take 1 tablet (5 mg total) by mouth 3 (three) times a week. Take 1 tablet Mon, Wed, Fri    . Tamsulosin HCl (FLOMAX) 0.4 MG CAPS Take 0.4 mg by mouth at bedtime.    . vitamin B-12 (CYANOCOBALAMIN) 1000 MCG tablet Take 1,000 mcg by mouth daily.     No current facility-administered medications for this visit.    Allergies:   Review of patient's allergies indicates no known allergies.    Social History:  The patient  reports that he has quit smoking. He does not have any smokeless tobacco history on file. He reports that he does not drink alcohol.   Family History:  The patient's family history is not on file.    ROS:  Please see the history of present illness.   Otherwise, review of systems are positive for none.   All other systems are reviewed and negative.    PHYSICAL EXAM: VS:  BP 152/76 mmHg  Pulse 85  Ht 5' 9.5" (1.765 m)  Wt 185 lb (83.915 kg)  BMI 26.94 kg/m2 , BMI Body mass index is 26.94  kg/(m^2). 148/70 mmHg GEN: Well nourished, well developed, in no acute distress HEENT: normal Neck: no JVD, carotid bruits, or masses Cardiac: RRR; no murmurs, rubs, or gallops,no edema  Respiratory:  clear to auscultation bilaterally, normal work of breathing GI: soft, nontender, nondistended, + BS MS: no deformity or atrophy Skin: warm and dry, no rash Neuro:  Strength and sensation are intact Psych: euthymic mood, full affect   EKG:  EKG is ordered today. The ekg ordered today demonstrates normal sinus rhythm with inferior Q waves and nonspecific ST abnormality. No change compared to prior. First-degree AV block   Recent Labs: No results found for requested labs within last 365 days.    Lipid Panel    Component Value Date/Time   CHOL  01/01/2010 0346    111        ATP III CLASSIFICATION:  <200     mg/dL   Desirable  200-239  mg/dL    Borderline High  >=240    mg/dL   High          TRIG 107 01/01/2010 0346   HDL 44 01/01/2010 0346   CHOLHDL 2.5 01/01/2010 0346   VLDL 21 01/01/2010 0346   LDLCALC  01/01/2010 0346    46        Total Cholesterol/HDL:CHD Risk Coronary Heart Disease Risk Table                     Men   Women  1/2 Average Risk   3.4   3.3  Average Risk       5.0   4.4  2 X Average Risk   9.6   7.1  3 X Average Risk  23.4   11.0        Use the calculated Patient Ratio above and the CHD Risk Table to determine the patient's CHD Risk.        ATP III CLASSIFICATION (LDL):  <100     mg/dL   Optimal  100-129  mg/dL   Near or Above                    Optimal  130-159  mg/dL   Borderline  160-189  mg/dL   High  >190     mg/dL   Very High      Wt Readings from Last 3 Encounters:  01/15/15 185 lb (83.915 kg)  12/21/13 190 lb (86.183 kg)      Other studies Reviewed: Additional studies/ records that were reviewed today include.    ASSESSMENT AND PLAN:  Chronic diastolic heart failure: Chronic and stable. He has dyspnea on exertion at times.  Claudication: Denies claudication that limits activity.  PSVT (paroxysmal supraventricular tachycardia): Asymptomatic  Coronary artery disease involving coronary bypass graft of native heart with unstable angina pectoris: No angina or nitroglycerin use  Essential hypertension: Poor systolic control. Decrease salt in diet.     Current medicines are reviewed at length with the patient today.  The patient does not have concerns regarding medicines.  The following changes have been made:  no change  Labs/ tests ordered today include:  No orders of the defined types were placed in this encounter.     Disposition:   FU with Linard Millers in 1 Year   Signed, Sinclair Grooms, MD  01/15/2015 4:28 PM    [address], [place], [place]  [address] Phone: [phone]; Fax: [phone]

## 2015-07-28 ENCOUNTER — Encounter (HOSPITAL_COMMUNITY): Payer: Self-pay | Admitting: Emergency Medicine

## 2015-07-28 ENCOUNTER — Emergency Department (HOSPITAL_COMMUNITY)
Admission: EM | Admit: 2015-07-28 | Discharge: 2015-07-28 | Disposition: A | Discharge status: Home / Self Care | Payer: Medicare Other | Attending: Emergency Medicine | Admitting: Emergency Medicine

## 2015-07-28 DIAGNOSIS — Y998 Other external cause status: Secondary | ICD-10-CM | POA: Insufficient documentation

## 2015-07-28 DIAGNOSIS — X12XXXA Contact with other hot fluids, initial encounter: Secondary | ICD-10-CM | POA: Insufficient documentation

## 2015-07-28 DIAGNOSIS — Z23 Encounter for immunization: Secondary | ICD-10-CM | POA: Insufficient documentation

## 2015-07-28 DIAGNOSIS — Y9289 Other specified places as the place of occurrence of the external cause: Secondary | ICD-10-CM | POA: Insufficient documentation

## 2015-07-28 DIAGNOSIS — I2510 Atherosclerotic heart disease of native coronary artery without angina pectoris: Secondary | ICD-10-CM | POA: Insufficient documentation

## 2015-07-28 DIAGNOSIS — Y9389 Activity, other specified: Secondary | ICD-10-CM | POA: Insufficient documentation

## 2015-07-28 DIAGNOSIS — E119 Type 2 diabetes mellitus without complications: Secondary | ICD-10-CM | POA: Insufficient documentation

## 2015-07-28 DIAGNOSIS — T25222A Burn of second degree of left foot, initial encounter: Secondary | ICD-10-CM | POA: Diagnosis not present

## 2015-07-28 DIAGNOSIS — I10 Essential (primary) hypertension: Secondary | ICD-10-CM | POA: Diagnosis not present

## 2015-07-28 DIAGNOSIS — Z87891 Personal history of nicotine dependence: Secondary | ICD-10-CM | POA: Diagnosis not present

## 2015-07-28 DIAGNOSIS — T25221A Burn of second degree of right foot, initial encounter: Secondary | ICD-10-CM | POA: Diagnosis not present

## 2015-07-28 DIAGNOSIS — T25022A Burn of unspecified degree of left foot, initial encounter: Secondary | ICD-10-CM | POA: Diagnosis present

## 2015-07-28 DIAGNOSIS — I509 Heart failure, unspecified: Secondary | ICD-10-CM | POA: Insufficient documentation

## 2015-07-28 DIAGNOSIS — Z79899 Other long term (current) drug therapy: Secondary | ICD-10-CM | POA: Insufficient documentation

## 2015-07-28 DIAGNOSIS — Z7982 Long term (current) use of aspirin: Secondary | ICD-10-CM | POA: Insufficient documentation

## 2015-07-28 MED ORDER — TETANUS-DIPHTH-ACELL PERTUSSIS 5-2.5-18.5 LF-MCG/0.5 IM SUSP
0.5000 mL | Freq: Once | INTRAMUSCULAR | Status: AC
  Administered 2015-07-28: 0.5 mL via INTRAMUSCULAR
  Filled 2015-07-28: qty 0.5

## 2015-07-28 MED ORDER — SILVER SULFADIAZINE 1 % EX CREA: TOPICAL_CREAM | Freq: Every day | CUTANEOUS | Status: DC

## 2015-07-28 MED ORDER — SILVER SULFADIAZINE 1 % EX CREA
TOPICAL_CREAM | Freq: Once | CUTANEOUS | Status: AC
  Administered 2015-07-28: 13:00:00 via TOPICAL
  Filled 2015-07-28 (×2): qty 85

## 2015-07-28 MED ORDER — CEPHALEXIN 500 MG PO CAPS
500.0000 mg | ORAL_CAPSULE | Freq: Two times a day (BID) | ORAL | Status: DC
Start: 2015-07-28 — End: 2015-08-08
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
## 2015-07-28 NOTE — Discharge Instructions (Signed)
Burn Care Your skin is a natural barrier to infection. It is the largest organ of your body. Burns damage this natural protection. To help prevent infection, it is very important to follow your caregiver's instructions in the care of your burn. Burns are classified as:  First degree. There is only redness of the skin (erythema). No scarring is expected.  Second degree. There is blistering of the skin. Scarring may occur with deeper burns.  Third degree. All layers of the skin are injured, and scarring is expected. HOME CARE INSTRUCTIONS   Wash your hands well before changing your bandage.  Change your bandage as often as directed by your caregiver.  Remove the old bandage. If the bandage sticks, you may soak it off with cool, clean water.  Cleanse the burn thoroughly but gently with mild soap and water.  Pat the area dry with a clean, dry cloth.  Apply a thin layer of antibacterial cream to the burn.  Apply a clean bandage as instructed by your caregiver.  Keep the bandage as clean and dry as possible.  Elevate the affected area for the first 24 hours, then as instructed by your caregiver.  Only take over-the-counter or prescription medicines for pain, discomfort, or fever as directed by your caregiver. SEEK IMMEDIATE MEDICAL CARE IF:   You develop excessive pain.  You develop redness, tenderness, swelling, or red streaks near the burn.  The burned area develops yellowish-white fluid (pus) or a bad smell.  You have a fever. MAKE SURE YOU:   Understand these instructions.  Will watch your condition.  Will get help right away if you are not doing well or get worse. Document Released: 11/02/2005 Document Revised: 01/25/2012 Document Reviewed: 03/25/2011 ExitCare Patient Information 2015 [employer]. This information is not intended to replace advice given to you by your health care provider. Make sure you discuss any questions you have with your health care  provider.  

## 2015-07-28 NOTE — Care Management Note (Signed)
Case Management Note  Patient Details  Name: Cole [name] MRN: [id number] Date of Birth: 03/15/1920  Subjective/Objective:                    Action/Plan:   Expected Discharge Date:                  Expected Discharge Plan:     In-House Referral:     Discharge planning Services  CM Consult  Post Acute Care Choice:    Choice offered to:  Patient  DME Arranged:    DME Agency:     HH Arranged:    [place] Agency:     Status of Service:  Completed, signed off  Medicare Important Message Given:    Date Medicare IM Given:    Medicare IM give by:    Date Additional Medicare IM Given:    Additional Medicare Important Message give by:     If discussed at [place] of Stay Meetings, dates discussed:    Additional Comments:  Delrae Sawyers, RN 07/28/2015, 11:28 AM

## 2015-07-28 NOTE — Progress Notes (Signed)
MD and CM spoke with pt and insisted that pt choose between admission to [name] or allowing Cole [name] in to home on 07/29/15/ pt agreed to have [name] visit for dressing changes on front porch. Does not want to allow "anyone" into his home. Made Tiffany aware. She states she will have to clear this with home office.

## 2015-07-28 NOTE — ED Notes (Signed)
Pt's left foot red/raw looking over toes, with fluid filled blisters on side of foot and reddened area on sole of foot, right foot has peeled skin over toes , top of foot-- blisters present over sides of foot and bottom of foot.
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
## 2015-07-28 NOTE — ED Notes (Signed)
Spoke with pt at length about follow up care, does not want any home health care. Has agreed to come back here to get feet checked and bandage changed tomorrow morning. Has a pre arranged appt with Dr. Mertha Finders on Tuesday.
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
## 2015-07-29 ENCOUNTER — Emergency Department (HOSPITAL_COMMUNITY)
Admission: EM | Admit: 2015-07-29 | Discharge: 2015-07-29 | Disposition: A | Discharge status: Home / Self Care | Payer: Medicare Other | Source: Home / Self Care | Attending: Family Medicine | Admitting: Family Medicine

## 2015-07-29 ENCOUNTER — Encounter (HOSPITAL_COMMUNITY): Payer: Self-pay | Admitting: Emergency Medicine

## 2015-07-29 DIAGNOSIS — Z09 Encounter for follow-up examination after completed treatment for conditions other than malignant neoplasm: Secondary | ICD-10-CM | POA: Diagnosis not present
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
## 2015-07-29 NOTE — Discharge Instructions (Signed)
Leave bandages in place until seen by your doctor on tues.

## 2015-07-30 ENCOUNTER — Inpatient Hospital Stay (HOSPITAL_COMMUNITY): Admission: AD | Admit: 2015-07-30 | Payer: Medicare Other | Source: Ambulatory Visit | Admitting: Internal Medicine

## 2015-07-30 ENCOUNTER — Encounter (HOSPITAL_COMMUNITY): Payer: Self-pay | Admitting: Emergency Medicine

## 2015-07-30 ENCOUNTER — Emergency Department (HOSPITAL_COMMUNITY)
Admission: EM | Admit: 2015-07-30 | Discharge: 2015-07-30 | Disposition: A | Discharge status: Other Acute Inpatient Hospital | Payer: Medicare Other | Attending: Emergency Medicine | Admitting: Emergency Medicine

## 2015-07-30 DIAGNOSIS — E119 Type 2 diabetes mellitus without complications: Secondary | ICD-10-CM | POA: Diagnosis not present

## 2015-07-30 DIAGNOSIS — I10 Essential (primary) hypertension: Secondary | ICD-10-CM | POA: Insufficient documentation

## 2015-07-30 DIAGNOSIS — Z951 Presence of aortocoronary bypass graft: Secondary | ICD-10-CM | POA: Insufficient documentation

## 2015-07-30 DIAGNOSIS — E538 Deficiency of other specified B group vitamins: Secondary | ICD-10-CM | POA: Insufficient documentation

## 2015-07-30 DIAGNOSIS — Z8719 Personal history of other diseases of the digestive system: Secondary | ICD-10-CM | POA: Diagnosis not present

## 2015-07-30 DIAGNOSIS — Z8669 Personal history of other diseases of the nervous system and sense organs: Secondary | ICD-10-CM | POA: Diagnosis not present

## 2015-07-30 DIAGNOSIS — I509 Heart failure, unspecified: Secondary | ICD-10-CM | POA: Insufficient documentation

## 2015-07-30 DIAGNOSIS — T24232S Burn of second degree of left lower leg, sequela: Secondary | ICD-10-CM | POA: Diagnosis not present

## 2015-07-30 DIAGNOSIS — T24209S Burn of second degree of unspecified site of unspecified lower limb, except ankle and foot, sequela: Secondary | ICD-10-CM

## 2015-07-30 DIAGNOSIS — Z7982 Long term (current) use of aspirin: Secondary | ICD-10-CM | POA: Diagnosis not present

## 2015-07-30 DIAGNOSIS — Z87891 Personal history of nicotine dependence: Secondary | ICD-10-CM | POA: Insufficient documentation

## 2015-07-30 DIAGNOSIS — Z79899 Other long term (current) drug therapy: Secondary | ICD-10-CM | POA: Diagnosis not present

## 2015-07-30 DIAGNOSIS — T24231S Burn of second degree of right lower leg, sequela: Secondary | ICD-10-CM | POA: Diagnosis present

## 2015-07-30 DIAGNOSIS — I2510 Atherosclerotic heart disease of native coronary artery without angina pectoris: Secondary | ICD-10-CM | POA: Insufficient documentation

## 2015-07-30 DIAGNOSIS — X118XXS Contact with other hot tap-water, sequela: Secondary | ICD-10-CM | POA: Diagnosis not present

## 2015-07-30 LAB — I-STAT CHEM 8, ED
BUN: 40 mg/dL — AB (ref 6–20)
CHLORIDE: 101 mmol/L (ref 101–111)
Calcium, Ion: 1.08 mmol/L — ABNORMAL LOW (ref 1.13–1.30)
Creatinine, Ser: 1.5 mg/dL — ABNORMAL HIGH (ref 0.61–1.24)
GLUCOSE: 134 mg/dL — AB (ref 65–99)
HCT: 39 % (ref 39.0–52.0)
Hemoglobin: 13.3 g/dL (ref 13.0–17.0)
POTASSIUM: 3.7 mmol/L (ref 3.5–5.1)
SODIUM: 139 mmol/L (ref 135–145)
TCO2: 27 mmol/L (ref 0–100)

## 2015-07-30 LAB — CBC WITH DIFFERENTIAL/PLATELET
Basophils Absolute: 0 10*3/uL (ref 0.0–0.1)
Basophils Relative: 0 % (ref 0–1)
Eosinophils Absolute: 0 10*3/uL (ref 0.0–0.7)
Eosinophils Relative: 0 % (ref 0–5)
HEMATOCRIT: 35.7 % — AB (ref 39.0–52.0)
HEMOGLOBIN: 12.1 g/dL — AB (ref 13.0–17.0)
LYMPHS ABS: 1.5 10*3/uL (ref 0.7–4.0)
LYMPHS PCT: 10 % — AB (ref 12–46)
MCH: 30.2 pg (ref 26.0–34.0)
MCHC: 33.9 g/dL (ref 30.0–36.0)
MCV: 89 fL (ref 78.0–100.0)
MONOS PCT: 10 % (ref 3–12)
Monocytes Absolute: 1.6 10*3/uL — ABNORMAL HIGH (ref 0.1–1.0)
NEUTROS PCT: 80 % — AB (ref 43–77)
Neutro Abs: 12.4 10*3/uL — ABNORMAL HIGH (ref 1.7–7.7)
Platelets: 187 10*3/uL (ref 150–400)
RBC: 4.01 MIL/uL — AB (ref 4.22–5.81)
RDW: 13.2 % (ref 11.5–15.5)
WBC: 15.6 10*3/uL — AB (ref 4.0–10.5)

## 2015-07-30 LAB — BASIC METABOLIC PANEL
Anion gap: 11 (ref 5–15)
BUN: 33 mg/dL — AB (ref 6–20)
CHLORIDE: 102 mmol/L (ref 101–111)
CO2: 25 mmol/L (ref 22–32)
Calcium: 9 mg/dL (ref 8.9–10.3)
Creatinine, Ser: 1.52 mg/dL — ABNORMAL HIGH (ref 0.61–1.24)
GFR calc Af Amer: 44 mL/min — ABNORMAL LOW (ref >60)
GFR calc non Af Amer: 38 mL/min — ABNORMAL LOW (ref >60)
GLUCOSE: 140 mg/dL — AB (ref 65–99)
POTASSIUM: 3.5 mmol/L (ref 3.5–5.1)
Sodium: 138 mmol/L (ref 135–145)

## 2015-07-30 MED ORDER — CLINDAMYCIN PHOSPHATE 900 MG/50ML IV SOLN
900.0000 mg | Freq: Once | INTRAVENOUS | Status: AC
  Administered 2015-07-30: 900 mg via INTRAVENOUS
  Filled 2015-07-30: qty 50

## 2015-07-30 MED ORDER — SILVER SULFADIAZINE 1 % EX CREA
TOPICAL_CREAM | Freq: Two times a day (BID) | CUTANEOUS | Status: DC
  Administered 2015-07-30: 19:00:00 via TOPICAL
  Filled 2015-07-30: qty 85
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
## 2015-07-30 NOTE — ED Notes (Signed)
Called for heart healthy diet for patient.

## 2015-07-31 DIAGNOSIS — G8911 Acute pain due to trauma: Secondary | ICD-10-CM | POA: Insufficient documentation

## 2015-07-31 DIAGNOSIS — T310 Burns involving less than 10% of body surface: Secondary | ICD-10-CM | POA: Insufficient documentation

## 2015-08-05 DIAGNOSIS — E663 Overweight: Secondary | ICD-10-CM | POA: Insufficient documentation

## 2015-08-07 ENCOUNTER — Encounter: Payer: Self-pay | Admitting: Internal Medicine

## 2015-08-07 ENCOUNTER — Non-Acute Institutional Stay (SKILLED_NURSING_FACILITY): Payer: Medicare Other | Admitting: Internal Medicine

## 2015-08-07 DIAGNOSIS — G8911 Acute pain due to trauma: Secondary | ICD-10-CM

## 2015-08-07 DIAGNOSIS — I5032 Chronic diastolic (congestive) heart failure: Secondary | ICD-10-CM | POA: Diagnosis not present

## 2015-08-07 DIAGNOSIS — I25700 Atherosclerosis of coronary artery bypass graft(s), unspecified, with unstable angina pectoris: Secondary | ICD-10-CM

## 2015-08-07 DIAGNOSIS — E1169 Type 2 diabetes mellitus with other specified complication: Secondary | ICD-10-CM | POA: Diagnosis not present

## 2015-08-07 DIAGNOSIS — I10 Essential (primary) hypertension: Secondary | ICD-10-CM

## 2015-08-07 DIAGNOSIS — N400 Enlarged prostate without lower urinary tract symptoms: Secondary | ICD-10-CM | POA: Diagnosis not present

## 2015-08-07 DIAGNOSIS — E785 Hyperlipidemia, unspecified: Secondary | ICD-10-CM

## 2015-08-07 DIAGNOSIS — T310 Burns involving less than 10% of body surface: Secondary | ICD-10-CM
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
## 2015-08-08 DIAGNOSIS — E119 Type 2 diabetes mellitus without complications: Secondary | ICD-10-CM | POA: Insufficient documentation

## 2015-08-08 DIAGNOSIS — E785 Hyperlipidemia, unspecified: Secondary | ICD-10-CM | POA: Insufficient documentation

## 2015-08-08 DIAGNOSIS — N400 Enlarged prostate without lower urinary tract symptoms: Secondary | ICD-10-CM | POA: Insufficient documentation

## 2015-08-08 DIAGNOSIS — I2510 Atherosclerotic heart disease of native coronary artery without angina pectoris: Secondary | ICD-10-CM | POA: Insufficient documentation

## 2015-08-10 ENCOUNTER — Encounter (HOSPITAL_COMMUNITY): Payer: Self-pay

## 2015-08-10 ENCOUNTER — Emergency Department (HOSPITAL_COMMUNITY)
Admission: EM | Admit: 2015-08-10 | Discharge: 2015-08-11 | Disposition: A | Discharge status: Home / Self Care | Payer: Medicare Other | Attending: Emergency Medicine | Admitting: Emergency Medicine

## 2015-08-10 ENCOUNTER — Emergency Department (HOSPITAL_COMMUNITY): Payer: Medicare Other

## 2015-08-10 DIAGNOSIS — I509 Heart failure, unspecified: Secondary | ICD-10-CM | POA: Insufficient documentation

## 2015-08-10 DIAGNOSIS — I10 Essential (primary) hypertension: Secondary | ICD-10-CM | POA: Insufficient documentation

## 2015-08-10 DIAGNOSIS — R0789 Other chest pain: Secondary | ICD-10-CM | POA: Diagnosis not present

## 2015-08-10 DIAGNOSIS — E119 Type 2 diabetes mellitus without complications: Secondary | ICD-10-CM | POA: Diagnosis not present

## 2015-08-10 DIAGNOSIS — Z87891 Personal history of nicotine dependence: Secondary | ICD-10-CM | POA: Diagnosis not present

## 2015-08-10 DIAGNOSIS — Z79899 Other long term (current) drug therapy: Secondary | ICD-10-CM | POA: Insufficient documentation

## 2015-08-10 DIAGNOSIS — R079 Chest pain, unspecified: Secondary | ICD-10-CM | POA: Diagnosis present

## 2015-08-10 DIAGNOSIS — Z7982 Long term (current) use of aspirin: Secondary | ICD-10-CM | POA: Diagnosis not present

## 2015-08-10 LAB — BASIC METABOLIC PANEL
Anion gap: 13 (ref 5–15)
BUN: 38 mg/dL — AB (ref 6–20)
CALCIUM: 9.2 mg/dL (ref 8.9–10.3)
CO2: 24 mmol/L (ref 22–32)
CREATININE: 1.6 mg/dL — AB (ref 0.61–1.24)
Chloride: 95 mmol/L — ABNORMAL LOW (ref 101–111)
GFR calc Af Amer: 41 mL/min — ABNORMAL LOW (ref >60)
GFR, EST NON AFRICAN AMERICAN: 36 mL/min — AB (ref >60)
GLUCOSE: 250 mg/dL — AB (ref 65–99)
POTASSIUM: 4.4 mmol/L (ref 3.5–5.1)
SODIUM: 132 mmol/L — AB (ref 135–145)

## 2015-08-10 LAB — PROTIME-INR
INR: 1.01 (ref 0.00–1.49)
PROTHROMBIN TIME: 13.5 s (ref 11.6–15.2)

## 2015-08-10 LAB — CBC
HEMATOCRIT: 31.7 % — AB (ref 39.0–52.0)
Hemoglobin: 10.5 g/dL — ABNORMAL LOW (ref 13.0–17.0)
MCH: 28.9 pg (ref 26.0–34.0)
MCHC: 33.1 g/dL (ref 30.0–36.0)
MCV: 87.3 fL (ref 78.0–100.0)
PLATELETS: 385 10*3/uL (ref 150–400)
RBC: 3.63 MIL/uL — ABNORMAL LOW (ref 4.22–5.81)
RDW: 12.9 % (ref 11.5–15.5)
WBC: 15.1 10*3/uL — AB (ref 4.0–10.5)

## 2015-08-10 LAB — I-STAT TROPONIN, ED: TROPONIN I, POC: 0 ng/mL (ref 0.00–0.08)

## 2015-08-10 MED ORDER — COLLAGENASE 250 UNIT/GM EX OINT
TOPICAL_OINTMENT | Freq: Once | CUTANEOUS | Status: AC
  Administered 2015-08-10: 2 via TOPICAL
  Filled 2015-08-10: qty 30

## 2015-08-10 MED ORDER — ACETAMINOPHEN 325 MG PO TABS
650.0000 mg | ORAL_TABLET | Freq: Once | ORAL | Status: AC
  Administered 2015-08-10: 650 mg via ORAL
  Filled 2015-08-10: qty 2

## 2015-08-10 NOTE — ED Notes (Signed)
Pt given fluid to drink.
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
## 2015-08-12 ENCOUNTER — Other Ambulatory Visit: Payer: Self-pay | Admitting: Interventional Cardiology

## 2015-08-19 ENCOUNTER — Encounter: Payer: Self-pay | Admitting: Adult Health

## 2015-08-19 ENCOUNTER — Non-Acute Institutional Stay (SKILLED_NURSING_FACILITY): Payer: Medicare Other | Admitting: Adult Health

## 2015-08-19 DIAGNOSIS — I10 Essential (primary) hypertension: Secondary | ICD-10-CM | POA: Diagnosis not present

## 2015-08-19 DIAGNOSIS — T310 Burns involving less than 10% of body surface: Secondary | ICD-10-CM

## 2015-08-19 DIAGNOSIS — I5032 Chronic diastolic (congestive) heart failure: Secondary | ICD-10-CM | POA: Diagnosis not present
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
## 2015-09-23 ENCOUNTER — Encounter: Payer: Self-pay | Admitting: Internal Medicine

## 2015-09-23 ENCOUNTER — Non-Acute Institutional Stay (SKILLED_NURSING_FACILITY): Payer: Medicare Other | Admitting: Internal Medicine

## 2015-09-23 DIAGNOSIS — T310 Burns involving less than 10% of body surface: Secondary | ICD-10-CM | POA: Diagnosis not present

## 2015-09-23 DIAGNOSIS — E785 Hyperlipidemia, unspecified: Secondary | ICD-10-CM

## 2015-09-23 DIAGNOSIS — I5032 Chronic diastolic (congestive) heart failure: Secondary | ICD-10-CM | POA: Diagnosis not present

## 2015-09-23 DIAGNOSIS — I10 Essential (primary) hypertension: Secondary | ICD-10-CM | POA: Diagnosis not present

## 2015-09-23 DIAGNOSIS — I25700 Atherosclerosis of coronary artery bypass graft(s), unspecified, with unstable angina pectoris: Secondary | ICD-10-CM | POA: Diagnosis not present

## 2015-09-23 DIAGNOSIS — E1169 Type 2 diabetes mellitus with other specified complication: Secondary | ICD-10-CM
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
## 2015-10-14 ENCOUNTER — Encounter: Payer: Self-pay | Admitting: Internal Medicine

## 2015-10-14 ENCOUNTER — Non-Acute Institutional Stay (SKILLED_NURSING_FACILITY): Payer: Medicare Other | Admitting: Internal Medicine

## 2015-10-14 DIAGNOSIS — I5032 Chronic diastolic (congestive) heart failure: Secondary | ICD-10-CM

## 2015-10-14 DIAGNOSIS — N400 Enlarged prostate without lower urinary tract symptoms: Secondary | ICD-10-CM | POA: Diagnosis not present

## 2015-10-14 DIAGNOSIS — I25700 Atherosclerosis of coronary artery bypass graft(s), unspecified, with unstable angina pectoris: Secondary | ICD-10-CM | POA: Diagnosis not present

## 2015-10-14 DIAGNOSIS — E785 Hyperlipidemia, unspecified: Secondary | ICD-10-CM | POA: Diagnosis not present

## 2015-10-14 DIAGNOSIS — E1169 Type 2 diabetes mellitus with other specified complication: Secondary | ICD-10-CM | POA: Diagnosis not present

## 2015-10-14 DIAGNOSIS — I10 Essential (primary) hypertension: Secondary | ICD-10-CM | POA: Diagnosis not present

## 2015-10-14 DIAGNOSIS — T310 Burns involving less than 10% of body surface: Secondary | ICD-10-CM
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
## 2015-11-04 ENCOUNTER — Encounter: Payer: Self-pay | Admitting: Internal Medicine

## 2015-11-04 ENCOUNTER — Non-Acute Institutional Stay (SKILLED_NURSING_FACILITY): Payer: Medicare Other | Admitting: Internal Medicine

## 2015-11-04 DIAGNOSIS — E785 Hyperlipidemia, unspecified: Secondary | ICD-10-CM

## 2015-11-04 DIAGNOSIS — E1169 Type 2 diabetes mellitus with other specified complication: Secondary | ICD-10-CM

## 2015-11-04 DIAGNOSIS — I10 Essential (primary) hypertension: Secondary | ICD-10-CM | POA: Diagnosis not present

## 2015-11-04 DIAGNOSIS — I5032 Chronic diastolic (congestive) heart failure: Secondary | ICD-10-CM

## 2015-11-04 DIAGNOSIS — T310 Burns involving less than 10% of body surface: Secondary | ICD-10-CM | POA: Diagnosis not present

## 2015-11-04 MED ORDER — ALBUTEROL SULFATE (2.5 MG/3ML) 0.083% IN NEBU: 2.5000 mg | INHALATION_SOLUTION | Freq: Four times a day (QID) | RESPIRATORY_TRACT | Status: DC | PRN
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
## 2015-11-25 DIAGNOSIS — L299 Pruritus, unspecified: Secondary | ICD-10-CM | POA: Insufficient documentation

## 2015-11-25 DIAGNOSIS — T300 Burn of unspecified body region, unspecified degree: Secondary | ICD-10-CM | POA: Insufficient documentation

## 2015-12-06 ENCOUNTER — Encounter: Payer: Self-pay | Admitting: Nurse Practitioner

## 2015-12-06 ENCOUNTER — Non-Acute Institutional Stay: Payer: Medicare Other | Admitting: Nurse Practitioner

## 2015-12-06 DIAGNOSIS — E1169 Type 2 diabetes mellitus with other specified complication: Secondary | ICD-10-CM

## 2015-12-06 DIAGNOSIS — I10 Essential (primary) hypertension: Secondary | ICD-10-CM | POA: Diagnosis not present

## 2015-12-06 DIAGNOSIS — I5032 Chronic diastolic (congestive) heart failure: Secondary | ICD-10-CM

## 2015-12-06 DIAGNOSIS — I25700 Atherosclerosis of coronary artery bypass graft(s), unspecified, with unstable angina pectoris: Secondary | ICD-10-CM | POA: Diagnosis not present

## 2015-12-06 DIAGNOSIS — E785 Hyperlipidemia, unspecified: Secondary | ICD-10-CM

## 2015-12-06 DIAGNOSIS — T310 Burns involving less than 10% of body surface: Secondary | ICD-10-CM
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
## 2015-12-11 LAB — LIPID PANEL
CHOLESTEROL: 181 mg/dL (ref 0–200)
Cholesterol: 173 mg/dL (ref 0–200)
HDL: 47 mg/dL (ref 35–70)
HDL: 50 mg/dL (ref 35–70)
LDL CALC: 79 mg/dL
LDL CALC: 86 mg/dL
TRIGLYCERIDES: 259 mg/dL — AB (ref 40–160)
Triglycerides: 200 mg/dL — AB (ref 40–160)

## 2015-12-11 LAB — BASIC METABOLIC PANEL
BUN: 36 mg/dL — AB (ref 4–21)
BUN: 39 mg/dL — AB (ref 4–21)
Creatinine: 1.6 mg/dL — AB (ref 0.6–1.3)
Creatinine: 1.6 mg/dL — AB (ref 0.6–1.3)
GLUCOSE: 135 mg/dL
Glucose: 113 mg/dL
POTASSIUM: 4.5 mmol/L (ref 3.4–5.3)
Potassium: 4.3 mmol/L (ref 3.4–5.3)
Sodium: 136 mmol/L — AB (ref 137–147)
Sodium: 140 mmol/L (ref 137–147)

## 2015-12-11 LAB — HEMOGLOBIN A1C
HEMOGLOBIN A1C: 6.9
Hemoglobin A1C: 6.8

## 2015-12-11 LAB — CBC AND DIFFERENTIAL
HCT: 36 % — AB (ref 41–53)
HEMATOCRIT: 37 % — AB (ref 41–53)
Hemoglobin: 12.1 g/dL — AB (ref 13.5–17.5)
Hemoglobin: 12.1 g/dL — AB (ref 13.5–17.5)
PLATELETS: 242 10*3/uL (ref 150–399)
Platelets: 230 10*3/uL (ref 150–399)
WBC: 10.8 10^3/mL
WBC: 9.2 10^3/mL

## 2015-12-11 LAB — HEPATIC FUNCTION PANEL
ALT: 7 U/L — AB (ref 10–40)
AST: 11 U/L — AB (ref 14–40)
Alkaline Phosphatase: 57 U/L (ref 25–125)
Bilirubin, Total: 0.5 mg/dL

## 2015-12-11 LAB — MICROALBUMIN, URINE: MICROALB UR: 5.88

## 2015-12-20 ENCOUNTER — Non-Acute Institutional Stay: Payer: Medicare Other | Admitting: Nurse Practitioner

## 2015-12-20 ENCOUNTER — Encounter: Payer: Self-pay | Admitting: Nurse Practitioner

## 2015-12-20 DIAGNOSIS — T310 Burns involving less than 10% of body surface: Secondary | ICD-10-CM | POA: Diagnosis not present

## 2015-12-20 DIAGNOSIS — E785 Hyperlipidemia, unspecified: Secondary | ICD-10-CM

## 2015-12-20 DIAGNOSIS — I25700 Atherosclerosis of coronary artery bypass graft(s), unspecified, with unstable angina pectoris: Secondary | ICD-10-CM

## 2015-12-20 DIAGNOSIS — E1169 Type 2 diabetes mellitus with other specified complication: Secondary | ICD-10-CM | POA: Diagnosis not present

## 2015-12-20 DIAGNOSIS — I5032 Chronic diastolic (congestive) heart failure: Secondary | ICD-10-CM | POA: Diagnosis not present

## 2015-12-20 DIAGNOSIS — I10 Essential (primary) hypertension: Secondary | ICD-10-CM | POA: Diagnosis not present
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
## 2016-01-17 ENCOUNTER — Non-Acute Institutional Stay: Payer: Medicare Other | Admitting: Nurse Practitioner

## 2016-01-17 ENCOUNTER — Encounter: Payer: Self-pay | Admitting: Nurse Practitioner

## 2016-01-17 DIAGNOSIS — T310 Burns involving less than 10% of body surface: Secondary | ICD-10-CM | POA: Diagnosis not present

## 2016-01-17 DIAGNOSIS — E1169 Type 2 diabetes mellitus with other specified complication: Secondary | ICD-10-CM | POA: Diagnosis not present

## 2016-01-17 DIAGNOSIS — I10 Essential (primary) hypertension: Secondary | ICD-10-CM | POA: Diagnosis not present

## 2016-01-17 DIAGNOSIS — I5032 Chronic diastolic (congestive) heart failure: Secondary | ICD-10-CM | POA: Diagnosis not present
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
## 2016-01-21 LAB — BASIC METABOLIC PANEL
BUN: 38 mg/dL — AB (ref 4–21)
CREATININE: 1.6 mg/dL — AB (ref 0.6–1.3)
GLUCOSE: 187 mg/dL
Potassium: 3.7 mmol/L (ref 3.4–5.3)
Sodium: 140 mmol/L (ref 137–147)

## 2016-02-26 ENCOUNTER — Non-Acute Institutional Stay: Payer: Medicare Other | Admitting: Adult Health

## 2016-02-26 ENCOUNTER — Encounter: Payer: Self-pay | Admitting: Adult Health

## 2016-02-26 DIAGNOSIS — T310 Burns involving less than 10% of body surface: Secondary | ICD-10-CM

## 2016-02-26 DIAGNOSIS — I10 Essential (primary) hypertension: Secondary | ICD-10-CM | POA: Diagnosis not present

## 2016-02-26 DIAGNOSIS — I5032 Chronic diastolic (congestive) heart failure: Secondary | ICD-10-CM

## 2016-02-26 DIAGNOSIS — E538 Deficiency of other specified B group vitamins: Secondary | ICD-10-CM | POA: Diagnosis not present

## 2016-02-26 DIAGNOSIS — E1169 Type 2 diabetes mellitus with other specified complication: Secondary | ICD-10-CM | POA: Diagnosis not present
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
## 2016-03-06 LAB — HM DIABETES FOOT EXAM

## 2016-03-24 LAB — HEPATIC FUNCTION PANEL
ALK PHOS: 40 U/L (ref 25–125)
ALT: 6 U/L — AB (ref 10–40)
AST: 10 U/L — AB (ref 14–40)
BILIRUBIN DIRECT: 0.1 mg/dL (ref 0.01–0.4)
Bilirubin, Total: 0.5 mg/dL

## 2016-03-24 LAB — HEMOGLOBIN A1C: Hemoglobin A1C: 6.7

## 2016-03-24 LAB — CBC AND DIFFERENTIAL
HCT: 32 % — AB (ref 41–53)
Hemoglobin: 10.9 g/dL — AB (ref 13.5–17.5)
PLATELETS: 204 10*3/uL (ref 150–399)
WBC: 9.3 10*3/mL

## 2016-03-24 LAB — BASIC METABOLIC PANEL
BUN: 38 mg/dL — AB (ref 4–21)
Creatinine: 1.6 mg/dL — AB (ref 0.6–1.3)
Glucose: 87 mg/dL
POTASSIUM: 4 mmol/L (ref 3.4–5.3)
SODIUM: 137 mmol/L (ref 137–147)

## 2016-03-24 LAB — LIPID PANEL
Cholesterol: 137 mg/dL (ref 0–200)
HDL: 47 mg/dL (ref 35–70)
LDL Cholesterol: 72 mg/dL
Triglycerides: 89 mg/dL (ref 40–160)

## 2016-04-02 ENCOUNTER — Non-Acute Institutional Stay: Payer: Medicare Other | Admitting: Adult Health

## 2016-04-02 ENCOUNTER — Encounter: Payer: Self-pay | Admitting: Adult Health

## 2016-04-02 DIAGNOSIS — I70209 Unspecified atherosclerosis of native arteries of extremities, unspecified extremity: Secondary | ICD-10-CM

## 2016-04-02 DIAGNOSIS — E1169 Type 2 diabetes mellitus with other specified complication: Secondary | ICD-10-CM | POA: Diagnosis not present

## 2016-04-02 DIAGNOSIS — E785 Hyperlipidemia, unspecified: Secondary | ICD-10-CM | POA: Diagnosis not present

## 2016-04-02 DIAGNOSIS — I110 Hypertensive heart disease with heart failure: Secondary | ICD-10-CM | POA: Insufficient documentation

## 2016-04-02 DIAGNOSIS — N400 Enlarged prostate without lower urinary tract symptoms: Secondary | ICD-10-CM

## 2016-04-02 DIAGNOSIS — I471 Supraventricular tachycardia: Secondary | ICD-10-CM

## 2016-04-02 DIAGNOSIS — I5032 Chronic diastolic (congestive) heart failure: Secondary | ICD-10-CM

## 2016-04-02 DIAGNOSIS — E1151 Type 2 diabetes mellitus with diabetic peripheral angiopathy without gangrene: Secondary | ICD-10-CM | POA: Insufficient documentation

## 2016-04-02 DIAGNOSIS — T310 Burns involving less than 10% of body surface: Secondary | ICD-10-CM

## 2016-04-02 DIAGNOSIS — E1159 Type 2 diabetes mellitus with other circulatory complications: Secondary | ICD-10-CM | POA: Diagnosis not present
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
## 2016-05-06 ENCOUNTER — Non-Acute Institutional Stay: Payer: Medicare Other | Admitting: Nurse Practitioner

## 2016-05-06 DIAGNOSIS — E1169 Type 2 diabetes mellitus with other specified complication: Secondary | ICD-10-CM | POA: Diagnosis not present

## 2016-05-06 DIAGNOSIS — I110 Hypertensive heart disease with heart failure: Secondary | ICD-10-CM | POA: Diagnosis not present

## 2016-05-06 DIAGNOSIS — T310 Burns involving less than 10% of body surface: Secondary | ICD-10-CM

## 2016-05-06 DIAGNOSIS — E785 Hyperlipidemia, unspecified: Secondary | ICD-10-CM | POA: Diagnosis not present

## 2016-05-06 DIAGNOSIS — I5032 Chronic diastolic (congestive) heart failure: Secondary | ICD-10-CM
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
## 2016-06-12 ENCOUNTER — Non-Acute Institutional Stay: Payer: Medicare Other | Admitting: Nurse Practitioner

## 2016-06-12 ENCOUNTER — Encounter: Payer: Self-pay | Admitting: Nurse Practitioner

## 2016-06-12 DIAGNOSIS — I2510 Atherosclerotic heart disease of native coronary artery without angina pectoris: Secondary | ICD-10-CM | POA: Diagnosis not present

## 2016-06-12 DIAGNOSIS — E785 Hyperlipidemia, unspecified: Secondary | ICD-10-CM | POA: Diagnosis not present

## 2016-06-12 DIAGNOSIS — T310 Burns involving less than 10% of body surface: Secondary | ICD-10-CM | POA: Diagnosis not present

## 2016-06-12 DIAGNOSIS — E1159 Type 2 diabetes mellitus with other circulatory complications: Secondary | ICD-10-CM

## 2016-06-12 DIAGNOSIS — I5032 Chronic diastolic (congestive) heart failure: Secondary | ICD-10-CM

## 2016-06-12 DIAGNOSIS — J309 Allergic rhinitis, unspecified: Secondary | ICD-10-CM

## 2016-06-12 DIAGNOSIS — I10 Essential (primary) hypertension: Secondary | ICD-10-CM | POA: Diagnosis not present
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
## 2016-06-25 ENCOUNTER — Encounter: Payer: Self-pay | Admitting: Internal Medicine

## 2016-06-25 ENCOUNTER — Non-Acute Institutional Stay (SKILLED_NURSING_FACILITY): Payer: Medicare Other | Admitting: Internal Medicine

## 2016-06-25 DIAGNOSIS — E1122 Type 2 diabetes mellitus with diabetic chronic kidney disease: Secondary | ICD-10-CM | POA: Diagnosis not present

## 2016-06-25 DIAGNOSIS — N184 Chronic kidney disease, stage 4 (severe): Secondary | ICD-10-CM

## 2016-06-25 DIAGNOSIS — I70209 Unspecified atherosclerosis of native arteries of extremities, unspecified extremity: Secondary | ICD-10-CM | POA: Diagnosis not present

## 2016-06-25 DIAGNOSIS — T310 Burns involving less than 10% of body surface: Secondary | ICD-10-CM | POA: Diagnosis not present

## 2016-06-25 NOTE — Assessment & Plan Note (Signed)
Continue present therapy Follow-up appointment October 2017 with Dr. Vertell Limber
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
## 2016-06-25 NOTE — Patient Instructions (Signed)
New orders for [facility] entry 8/14. BMET  A1c

## 2016-06-25 NOTE — Assessment & Plan Note (Signed)
Recheck A1c and BMET to assess present dual diabetic therapy

## 2016-06-30 LAB — BASIC METABOLIC PANEL
BUN: 31 mg/dL — AB (ref 4–21)
CREATININE: 1.6 mg/dL — AB (ref 0.6–1.3)
Glucose: 85 mg/dL
POTASSIUM: 3.8 mmol/L (ref 3.4–5.3)
SODIUM: 140 mmol/L (ref 137–147)

## 2016-06-30 LAB — HEMOGLOBIN A1C
HEMOGLOBIN A1C: 8.7
Hemoglobin A1C: 6.2

## 2016-07-01 LAB — HEMOGLOBIN A1C: Hemoglobin A1C: 6.2

## 2016-07-08 ENCOUNTER — Encounter: Payer: Self-pay | Admitting: Nurse Practitioner

## 2016-07-08 ENCOUNTER — Non-Acute Institutional Stay: Payer: Medicare Other | Admitting: Nurse Practitioner

## 2016-07-08 DIAGNOSIS — T310 Burns involving less than 10% of body surface: Secondary | ICD-10-CM

## 2016-07-08 DIAGNOSIS — N400 Enlarged prostate without lower urinary tract symptoms: Secondary | ICD-10-CM

## 2016-07-08 DIAGNOSIS — E1122 Type 2 diabetes mellitus with diabetic chronic kidney disease: Secondary | ICD-10-CM | POA: Diagnosis not present

## 2016-07-08 DIAGNOSIS — I5032 Chronic diastolic (congestive) heart failure: Secondary | ICD-10-CM

## 2016-07-08 DIAGNOSIS — I25810 Atherosclerosis of coronary artery bypass graft(s) without angina pectoris: Secondary | ICD-10-CM

## 2016-07-08 DIAGNOSIS — I10 Essential (primary) hypertension: Secondary | ICD-10-CM
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
## 2016-07-29 ENCOUNTER — Encounter: Payer: Self-pay | Admitting: Nurse Practitioner

## 2016-07-29 ENCOUNTER — Non-Acute Institutional Stay: Payer: Medicare Other | Admitting: Nurse Practitioner

## 2016-07-29 DIAGNOSIS — E559 Vitamin D deficiency, unspecified: Secondary | ICD-10-CM

## 2016-07-29 DIAGNOSIS — I10 Essential (primary) hypertension: Secondary | ICD-10-CM | POA: Diagnosis not present

## 2016-07-29 DIAGNOSIS — T310 Burns involving less than 10% of body surface: Secondary | ICD-10-CM

## 2016-07-29 DIAGNOSIS — I5032 Chronic diastolic (congestive) heart failure: Secondary | ICD-10-CM | POA: Diagnosis not present

## 2016-07-29 DIAGNOSIS — I70209 Unspecified atherosclerosis of native arteries of extremities, unspecified extremity: Secondary | ICD-10-CM | POA: Diagnosis not present
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
## 2016-08-03 ENCOUNTER — Encounter: Payer: Self-pay | Admitting: Internal Medicine

## 2016-08-04 ENCOUNTER — Non-Acute Institutional Stay: Payer: Medicare Other | Admitting: Internal Medicine

## 2016-08-04 ENCOUNTER — Encounter: Payer: Self-pay | Admitting: Internal Medicine

## 2016-08-04 DIAGNOSIS — R938 Abnormal findings on diagnostic imaging of other specified body structures: Secondary | ICD-10-CM | POA: Diagnosis not present

## 2016-08-04 DIAGNOSIS — R9389 Abnormal findings on diagnostic imaging of other specified body structures: Secondary | ICD-10-CM

## 2016-08-04 DIAGNOSIS — R0600 Dyspnea, unspecified: Secondary | ICD-10-CM | POA: Diagnosis not present

## 2016-08-04 NOTE — Patient Instructions (Addendum)
08/10/15 [facility] portable chest x-ray compared to the 08/03/18 images   08/10/15: Increased interstitial markings and cardiomegaly. No active cardio pulmonary disease 08/03/16 marked increase in the interstitial markings extending to the lateral thorax bilaterally. Markings most strikingly increased in the left lower lobe retrocardiac area. Lateral film suggests possible lingular syndrome. There is no costophrenic angle blunting or definite effusions. Interstitial pneumonitis versus vascular accentuation. BNP will be performed D/C Levaquin

## 2016-08-04 NOTE — Progress Notes (Signed)
   This is a nursing facility follow up for specific acute issue of dyspnea with abnormal x-ray suggesting congestive heart failure and possible bilateral multifocal bronchopneumonia.  Interim medical record and care since last [facility] visit was updated with review of diagnostic studies and change in clinical status since last visit were documented.  HPI: X-ray was performed 08/03/16 for shortness of breath. He had some dyspnea at rest which he felt was related to some abdominal distention. He denies any other active GI symptoms. He denied any upper or lower respiratory tract infection symptoms.  He also had some mild exertional dyspnea. [facility] mobile imaging performed portable x-ray at the request of the on call physician; congestive heart failure and possible bilateral multifocal pneumonia was reported. The patient was placed on Levaquin 7 days.  Past history includes hypertension, diabetes, coronary artery disease and heart failure. He has had coronary artery bypass grafting  Review of systems: Constitutional: No fever,significant weight change, fatigue  Eyes: No redness, discharge, pain, vision change ENT/mouth: No nasal congestion,  purulent discharge, earache,change in hearing ,sore throat  Cardiovascular: No chest pain, palpitations,paroxysmal nocturnal dyspnea, claudication, edema  Respiratory: No cough, sputum production,hemoptysis, significant snoring,apnea   Gastrointestinal: No heartburn,dysphagia,abdominal pain, nausea / vomiting,rectal bleeding, melena,change in bowels Genitourinary: No dysuria,hematuria, pyuria,  incontinence, nocturia Musculoskeletal: No joint stiffness, joint swelling, weakness,pain Dermatologic: No new rash, pruritus, change in appearance of skin Endocrine: No change in hair/skin/ nails, excessive thirst, excessive hunger, excessive urination  Hematologic/lymphatic: No significant bruising, lymphadenopathy,abnormal bleeding Allergy/immunology:  No itchy/ watery eyes, significant sneezing, urticaria, angioedema  Physical exam:  Pertinent or positive findings: At the time of exam he was sitting comfortably on the edge of the bed completing a crossword puzzle. He has pattern alopecia. Dense arcus senilis present. He is completely edentulous. Heart rhythm and rate are regular has minimal symmetrical rales with no increased work of breathing. Abdomen is protuberant. He has one half-1+ edema of the right lower extremity. There is faint erythematous hyperpigmentation of the left lower extremity below the knee. The ankle is wrapped. Trace LLE edema. Pedal pulses are decreased. He has onycholytic scarring and pitting of the nails of the left hand. Cystic structures over the right index DIP and PIP joints. The proximal SQ lesion is slightly bluish . OA joint changes in the hands are present.  General appearance:Adequately nourished; no acute distress , increased work of breathing is present.   Lymphatic: No lymphadenopathy about the head, neck, axilla . Eyes: No conjunctival inflammation or lid edema is present. There is no scleral icterus. Ears:  External ear exam shows no significant lesions or deformities.   Nose:  External nasal examination shows no deformity or inflammation. Nasal mucosa are pink and moist without lesions ,exudates Oral exam: lips and gums are healthy appearing.There is no oropharyngeal erythema or exudate . Neck:  No thyromegaly, masses, tenderness noted.    Heart:  No gallop, murmur, click, rub .  Abdomen:Bowel sounds are normal. Abdomen is soft and nontender with no organomegaly, hernias,masses. GU: deferred  Extremities:  No cyanosis, clubbing  Neurologic exam : Strength equal  in upper & lower extremities Skin: Warm & dry w/o tenting. No significant rash.   #1 dyspnea, improved #2 abnormal chest x-ray suggesting congestive heart failure and bilateral multifocal bronchopneumonia . The history and physical are not  suggestive of bronchopneumonia.  Plan: Attempt will be made to visualize the actual images performed by [facility].

## 2016-08-07 ENCOUNTER — Encounter: Payer: Self-pay | Admitting: Nurse Practitioner

## 2016-08-07 ENCOUNTER — Non-Acute Institutional Stay: Payer: Medicare Other | Admitting: Nurse Practitioner

## 2016-08-07 DIAGNOSIS — I5032 Chronic diastolic (congestive) heart failure: Secondary | ICD-10-CM | POA: Diagnosis not present

## 2016-08-07 DIAGNOSIS — I10 Essential (primary) hypertension: Secondary | ICD-10-CM | POA: Diagnosis not present

## 2016-08-07 DIAGNOSIS — R600 Localized edema: Secondary | ICD-10-CM | POA: Diagnosis not present

## 2016-08-07 NOTE — Progress Notes (Signed)
This encounter was created in error - please disregard.
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
## 2016-08-08 ENCOUNTER — Emergency Department (HOSPITAL_COMMUNITY)
Admission: EM | Admit: 2016-08-08 | Discharge: 2016-08-08 | Discharge status: Absent without leave | Payer: Medicare Other

## 2016-08-08 ENCOUNTER — Other Ambulatory Visit: Payer: Self-pay | Admitting: Nurse Practitioner

## 2016-08-08 ENCOUNTER — Ambulatory Visit (HOSPITAL_COMMUNITY)
Admission: RE | Admit: 2016-08-08 | Discharge: 2016-08-08 | Disposition: A | Discharge status: Home / Self Care | Payer: Medicare Other | Source: Ambulatory Visit | Attending: Nurse Practitioner | Admitting: Nurse Practitioner

## 2016-08-08 ENCOUNTER — Ambulatory Visit (HOSPITAL_COMMUNITY)
Admission: RE | Admit: 2016-08-08 | Discharge: 2016-08-08 | Disposition: A | Discharge status: Home / Self Care | Payer: Medicare Other | Source: Ambulatory Visit | Attending: Radiology | Admitting: Radiology

## 2016-08-08 DIAGNOSIS — R0602 Shortness of breath: Secondary | ICD-10-CM

## 2016-08-08 DIAGNOSIS — I700 Atherosclerosis of aorta: Secondary | ICD-10-CM | POA: Diagnosis not present

## 2016-08-08 DIAGNOSIS — R918 Other nonspecific abnormal finding of lung field: Secondary | ICD-10-CM | POA: Diagnosis not present

## 2016-08-11 ENCOUNTER — Encounter: Payer: Self-pay | Admitting: Interventional Cardiology

## 2016-08-11 ENCOUNTER — Ambulatory Visit (INDEPENDENT_AMBULATORY_CARE_PROVIDER_SITE_OTHER): Payer: Medicare Other | Admitting: Interventional Cardiology

## 2016-08-11 VITALS — BP 174/70 | HR 75 | Ht 68.0 in | Wt 193.6 lb

## 2016-08-11 DIAGNOSIS — I25701 Atherosclerosis of coronary artery bypass graft(s), unspecified, with angina pectoris with documented spasm: Secondary | ICD-10-CM | POA: Diagnosis not present

## 2016-08-11 DIAGNOSIS — I10 Essential (primary) hypertension: Secondary | ICD-10-CM

## 2016-08-11 DIAGNOSIS — I480 Paroxysmal atrial fibrillation: Secondary | ICD-10-CM

## 2016-08-11 DIAGNOSIS — I70209 Unspecified atherosclerosis of native arteries of extremities, unspecified extremity: Secondary | ICD-10-CM

## 2016-08-11 DIAGNOSIS — I5032 Chronic diastolic (congestive) heart failure: Secondary | ICD-10-CM | POA: Diagnosis not present

## 2016-08-11 MED ORDER — ASPIRIN 81 MG PO TBEC: 81.0000 mg | DELAYED_RELEASE_TABLET | Freq: Every day | ORAL | Status: DC
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
## 2016-08-11 NOTE — Patient Instructions (Signed)
Medication Instructions:  Your physician has recommended you make the following change in your medication:  REDUCE Aspirin to 81mg  daily   Labwork: None ordered  Testing/Procedures: None ordered  Follow-Up: Your physician wants you to follow-up in: 1 year or as needed with Dr.Smith You will receive a reminder letter in the mail two months in advance. If you don't receive a letter, please call our office to schedule the follow-up appointment.   Any Other Special Instructions Will Be Listed Below (If Applicable).     If you need a refill on your cardiac medications before your next appointment, please call your pharmacy.

## 2016-08-24 ENCOUNTER — Non-Acute Institutional Stay: Payer: Medicare Other | Admitting: Nurse Practitioner

## 2016-08-24 ENCOUNTER — Encounter: Payer: Self-pay | Admitting: Nurse Practitioner

## 2016-08-24 DIAGNOSIS — R600 Localized edema: Secondary | ICD-10-CM | POA: Diagnosis not present

## 2016-08-24 DIAGNOSIS — I5032 Chronic diastolic (congestive) heart failure: Secondary | ICD-10-CM | POA: Diagnosis not present

## 2016-08-24 DIAGNOSIS — R350 Frequency of micturition: Secondary | ICD-10-CM | POA: Diagnosis not present

## 2016-08-24 NOTE — Progress Notes (Signed)
Patient ID: Cole [name], male   DOB: 03-06-1920, 82 y.o.   MRN: [id number]    Nursing Home Location:  [facility]  Place of Service: SNF (31)  PCP: Henrine Screws, MD  No Known Allergies  Chief Complaint  Patient presents with  . Acute Visit    Refusing lasix    HPI:  Patient is a 82 y.o. male seen today at [facility] at the request of nursing due to pt not taking lasix.  Pt with hx of htn, dm, CAD, CHF, bph. Recently BNP was obtained and found to be elevated. Discussed with pt compliance with diet and medication. Pt reports he has increase in urination and that is why he does not take diuretic. Denies shortness of breath, chest pains, increase edema and weight has been stable. Pt reports he has been cutting them in half sometimes as well.   Review of Systems:  Review of Systems  Constitutional: Negative for appetite change and fatigue.  HENT: Negative for congestion.   Respiratory: Negative for cough, chest tightness and shortness of breath.   Cardiovascular: Positive for leg swelling. Negative for chest pain and palpitations.  Gastrointestinal: Negative for abdominal pain, constipation, diarrhea and nausea.  Genitourinary: Positive for frequency (with incontience). Negative for difficulty urinating, dysuria and hematuria.  Skin:       Ongoing treatment to right foot due to burn  Neurological: Negative for dizziness.    Past Medical History:  Diagnosis Date  . Actinic keratitis   . CHF (congestive heart failure) ([place])   . Claudication ([place])   . Coronary artery disease    status post CABG, 1999  . Diabetes mellitus   . Diverticulosis   . DJD (degenerative joint disease)   . Hypertension    hypertensive syndrome with dyspnea -[facility], February, 2011 - EF 50% and cardiac bypass grafts all patent - responded to diuretics and blood pressure control - [name]  . Microscopic hematuria   . Onychomycosis   . Prostate nodule   .  PSVT (paroxysmal supraventricular tachycardia) ([place])   . Vitamin B12 deficiency   . Vitamin D deficiency    Past Surgical History:  Procedure Laterality Date  . APPENDECTOMY    . CORONARY ARTERY BYPASS GRAFT     Social History:   reports that he has quit smoking. His smoking use included Cigars. He has never used smokeless tobacco. He reports that he does not drink alcohol or use drugs.  Family History  Problem Relation Age of Onset  . Family history unknown: Yes    Medications: Patient's Medications  New Prescriptions   No medications on file  Previous Medications   ACETAMINOPHEN (TYLENOL) 325 MG TABLET    Take 650 mg by mouth every 6 (six) hours as needed for mild pain.    AMINO ACIDS-PROTEIN HYDROLYS (FEEDING SUPPLEMENT, PRO-STAT SUGAR FREE 64,) LIQD    Take 30 mLs by mouth 2 (two) times daily with a meal.   AMLODIPINE (NORVASC) 5 MG TABLET    Take 5 mg by mouth daily.    ASPIRIN 81 MG EC TABLET    Take 1 tablet (81 mg total) by mouth daily.   CHOLECALCIFEROL (VITAMIN D) 1000 UNITS TABLET    Take 1,000 Units by mouth daily.   FLUTICASONE (FLONASE) 50 MCG/ACT NASAL SPRAY    Place 1 spray into both nostrils daily.   FUROSEMIDE (LASIX) 80 MG TABLET    Take 80 mg by mouth every morning.  GLIPIZIDE-METFORMIN (METAGLIP) 2.5-500 MG TABLET    Take 1 tablet by mouth daily after breakfast.    LATANOPROST (XALATAN) 0.005 % OPHTHALMIC SOLUTION    Place 1 drop into both eyes at bedtime.   LISINOPRIL (PRINIVIL,ZESTRIL) 20 MG TABLET    Take 20 mg by mouth daily. Reported on 05/06/2016   LORATADINE (CLARITIN) 10 MG TABLET    Take 10 mg by mouth daily as needed for allergies.   METOPROLOL (LOPRESSOR) 50 MG TABLET    TAKE 1 TABLET (50 MG TOTAL) BY MOUTH 2 (TWO) TIMES DAILY.   MULTIPLE VITAMIN (MULTIVITAMIN) TABLET    Take 1 tablet by mouth daily.   ROSUVASTATIN (CRESTOR) 5 MG TABLET    Take 1 tablet (5 mg total) by mouth 3 (three) times a week. Take 1 tablet Mon, Wed, Fri   TAMSULOSIN (FLOMAX)  0.4 MG CAPS CAPSULE    Take 0.4 mg by mouth at bedtime.    VITAMIN B-12 (CYANOCOBALAMIN) 1000 MCG TABLET    Take 1,000 mcg by mouth daily.   VITAMIN C (ASCORBIC ACID) 500 MG TABLET    Take 500 mg by mouth 2 (two) times daily.   Modified Medications   No medications on file  Discontinued Medications   No medications on file     Physical Exam: Vitals:   08/24/16 0941  BP: 138/70  Pulse: 73  Resp: 18  Temp: 97.5 F (36.4 C)  SpO2: 98%  Weight: 193 lb (87.5 kg)  Height: 5\' 8"  (1.727 m)    Physical Exam  Constitutional: He is oriented to person, place, and time. He appears well-developed and well-nourished.  HENT:  Head: Normocephalic and atraumatic.  Mouth/Throat: Oropharynx is clear and moist.  Eyes: Pupils are equal, round, and reactive to light.  Neck: Neck supple. Carotid bruit is not present.  Cardiovascular: Normal rate, regular rhythm and normal heart sounds.   Pulmonary/Chest: Effort normal and breath sounds normal.  Abdominal: Soft. Bowel sounds are normal. He exhibits no abdominal bruit and no pulsatile midline mass.  Musculoskeletal: He exhibits edema (1+ bilaterally). He exhibits no tenderness.  Neurological: He is alert and oriented to person, place, and time.  Skin: Skin is warm and dry.  Dressing to right foot, CDI, no erythema or increase in drainage   Psychiatric: He has a normal mood and affect.    Labs reviewed: Basic Metabolic Panel:  Recent Labs  01/21/16 03/24/16 06/30/16  NA 140 137 140  K 3.7 4.0 3.8  BUN 38* 38* 31*  CREATININE 1.6* 1.6* 1.6*   Liver Function Tests:  Recent Labs  12/11/15 03/24/16  AST 11* 10*  ALT 7* 6*  ALKPHOS 57 40   No results for input(s): LIPASE, AMYLASE in the last 8760 hours. No results for input(s): AMMONIA in the last 8760 hours. CBC:  Recent Labs  12/11/15 03/24/16  WBC 9.2  10.8 9.3  HGB 12.1*  12.1* 10.9*  HCT 36*  37* 32*  PLT 230  242 204   TSH: No results for input(s): TSH in the last 8760  hours. A1C: Lab Results  Component Value Date   HGBA1C 8.7 06/30/2016   Lipid Panel:  Recent Labs  12/11/15 03/24/16  CHOL 173  181 137  HDL 47  50 47  LDLCALC 86  79 72  TRIG 200*  259* 89   BNP 08/06/16: 375.4  Assessment/Plan 1. Chronic diastolic heart failure ([place]) Education provided to pt on need to take diuretic due to heart failure and importance  of not having too much fluid. Pt reports major concern is wetting himself. Discussed risk of hospitalization, worsening heart failure, and death with pt due to non-complaince of medication and diet, pt voices understanding Pt reports he will start being complaint with medication, will follow up bmp in 2 weeks   2. Frequent urination Due to diuretics, talked to pt able using urinal at night and wearing depends to help keep clothes clean Also to use bathroom schedule.   3. Bilateral edema of lower extremity Remains stable, to cont diuretic   Sheresa Cullop K. [name]  [facility] [phone] 8 am - 5 pm) [phone] (after hours)

## 2016-09-09 ENCOUNTER — Encounter: Payer: Self-pay | Admitting: Nurse Practitioner

## 2016-09-09 ENCOUNTER — Non-Acute Institutional Stay: Payer: Medicare Other | Admitting: Nurse Practitioner

## 2016-09-09 DIAGNOSIS — T310 Burns involving less than 10% of body surface: Secondary | ICD-10-CM | POA: Diagnosis not present

## 2016-09-09 DIAGNOSIS — R600 Localized edema: Secondary | ICD-10-CM | POA: Diagnosis not present

## 2016-09-09 DIAGNOSIS — K5900 Constipation, unspecified: Secondary | ICD-10-CM | POA: Diagnosis not present

## 2016-09-09 DIAGNOSIS — E1151 Type 2 diabetes mellitus with diabetic peripheral angiopathy without gangrene: Secondary | ICD-10-CM

## 2016-09-09 DIAGNOSIS — I5032 Chronic diastolic (congestive) heart failure: Secondary | ICD-10-CM

## 2016-09-09 DIAGNOSIS — I70209 Unspecified atherosclerosis of native arteries of extremities, unspecified extremity: Secondary | ICD-10-CM | POA: Diagnosis not present
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
## 2016-09-10 LAB — BASIC METABOLIC PANEL
BUN: 29 mg/dL — AB (ref 4–21)
CREATININE: 1.4 mg/dL — AB (ref 0.6–1.3)
Glucose: 142 mg/dL
POTASSIUM: 3.8 mmol/L (ref 3.4–5.3)
SODIUM: 136 mmol/L — AB (ref 137–147)

## 2016-09-16 LAB — BASIC METABOLIC PANEL
BUN: 21 mg/dL (ref 4–21)
Creatinine: 1.4 mg/dL — AB (ref 0.6–1.3)
GLUCOSE: 122 mg/dL
POTASSIUM: 3.9 mmol/L (ref 3.4–5.3)
SODIUM: 137 mmol/L (ref 137–147)

## 2016-09-17 ENCOUNTER — Encounter: Payer: Self-pay | Admitting: *Deleted

## 2016-09-17 NOTE — Progress Notes (Unsigned)
Labs Abstracted from [facility]

## 2016-09-23 ENCOUNTER — Non-Acute Institutional Stay: Payer: Medicare Other | Admitting: Internal Medicine

## 2016-09-23 ENCOUNTER — Encounter: Payer: Self-pay | Admitting: Internal Medicine

## 2016-09-23 DIAGNOSIS — I5032 Chronic diastolic (congestive) heart failure: Secondary | ICD-10-CM | POA: Diagnosis not present

## 2016-09-23 DIAGNOSIS — I471 Supraventricular tachycardia: Secondary | ICD-10-CM

## 2016-09-23 DIAGNOSIS — R0600 Dyspnea, unspecified: Secondary | ICD-10-CM

## 2016-09-23 DIAGNOSIS — R0609 Other forms of dyspnea: Secondary | ICD-10-CM

## 2016-09-23 DIAGNOSIS — R0602 Shortness of breath: Secondary | ICD-10-CM | POA: Insufficient documentation

## 2016-09-23 NOTE — Assessment & Plan Note (Signed)
No evidence of decompensation at this time If symptoms persist,BNP will be checked
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
## 2016-09-23 NOTE — Patient Instructions (Signed)
See Current Assessment & Plan in Problem List under specific Diagnosis 

## 2016-09-23 NOTE — Assessment & Plan Note (Signed)
Symptoms intermittent and not active at this time. Imaging and labs will be performed if symptoms persist or progress.

## 2016-09-25 ENCOUNTER — Non-Acute Institutional Stay: Payer: Medicare Other | Admitting: Nurse Practitioner

## 2016-09-25 ENCOUNTER — Encounter: Payer: Self-pay | Admitting: Nurse Practitioner

## 2016-09-25 DIAGNOSIS — N400 Enlarged prostate without lower urinary tract symptoms: Secondary | ICD-10-CM

## 2016-09-25 DIAGNOSIS — R0602 Shortness of breath: Secondary | ICD-10-CM | POA: Diagnosis not present

## 2016-09-25 DIAGNOSIS — I5032 Chronic diastolic (congestive) heart failure: Secondary | ICD-10-CM | POA: Diagnosis not present

## 2016-09-25 DIAGNOSIS — K5900 Constipation, unspecified: Secondary | ICD-10-CM | POA: Diagnosis not present

## 2016-09-25 DIAGNOSIS — I10 Essential (primary) hypertension: Secondary | ICD-10-CM | POA: Diagnosis not present

## 2016-09-25 DIAGNOSIS — D649 Anemia, unspecified: Secondary | ICD-10-CM | POA: Diagnosis not present
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
## 2016-09-29 LAB — BASIC METABOLIC PANEL
BUN: 27 mg/dL — AB (ref 4–21)
Creatinine: 1.4 mg/dL — AB (ref 0.6–1.3)
Glucose: 125 mg/dL
POTASSIUM: 4.3 mmol/L (ref 3.4–5.3)
SODIUM: 136 mmol/L — AB (ref 137–147)

## 2016-09-29 LAB — CBC AND DIFFERENTIAL
HEMATOCRIT: 31 % — AB (ref 41–53)
Hemoglobin: 10.2 g/dL — AB (ref 13.5–17.5)
Neutrophils Absolute: 3312 /uL
Platelets: 194 10*3/uL (ref 150–399)
WBC: 4.8 10^3/mL

## 2016-10-05 ENCOUNTER — Emergency Department (HOSPITAL_COMMUNITY)
Admission: EM | Admit: 2016-10-05 | Discharge: 2016-10-05 | Disposition: A | Discharge status: Home / Self Care | Payer: Medicare Other | Attending: Emergency Medicine | Admitting: Emergency Medicine

## 2016-10-05 ENCOUNTER — Emergency Department (HOSPITAL_COMMUNITY): Payer: Medicare Other

## 2016-10-05 ENCOUNTER — Encounter (HOSPITAL_COMMUNITY): Payer: Self-pay | Admitting: Emergency Medicine

## 2016-10-05 DIAGNOSIS — I110 Hypertensive heart disease with heart failure: Secondary | ICD-10-CM | POA: Diagnosis not present

## 2016-10-05 DIAGNOSIS — Z7982 Long term (current) use of aspirin: Secondary | ICD-10-CM | POA: Diagnosis not present

## 2016-10-05 DIAGNOSIS — R0602 Shortness of breath: Secondary | ICD-10-CM | POA: Diagnosis present

## 2016-10-05 DIAGNOSIS — I2510 Atherosclerotic heart disease of native coronary artery without angina pectoris: Secondary | ICD-10-CM | POA: Insufficient documentation

## 2016-10-05 DIAGNOSIS — Z951 Presence of aortocoronary bypass graft: Secondary | ICD-10-CM | POA: Insufficient documentation

## 2016-10-05 DIAGNOSIS — E119 Type 2 diabetes mellitus without complications: Secondary | ICD-10-CM | POA: Diagnosis not present

## 2016-10-05 DIAGNOSIS — Z87891 Personal history of nicotine dependence: Secondary | ICD-10-CM | POA: Diagnosis not present

## 2016-10-05 DIAGNOSIS — I501 Left ventricular failure: Secondary | ICD-10-CM | POA: Insufficient documentation

## 2016-10-05 DIAGNOSIS — Z79899 Other long term (current) drug therapy: Secondary | ICD-10-CM | POA: Diagnosis not present

## 2016-10-05 DIAGNOSIS — J189 Pneumonia, unspecified organism: Secondary | ICD-10-CM | POA: Insufficient documentation

## 2016-10-05 LAB — BASIC METABOLIC PANEL
ANION GAP: 9 (ref 5–15)
BUN: 34 mg/dL — ABNORMAL HIGH (ref 6–20)
CALCIUM: 9.3 mg/dL (ref 8.9–10.3)
CO2: 24 mmol/L (ref 22–32)
Chloride: 103 mmol/L (ref 101–111)
Creatinine, Ser: 1.69 mg/dL — ABNORMAL HIGH (ref 0.61–1.24)
GFR, EST AFRICAN AMERICAN: 38 mL/min — AB (ref >60)
GFR, EST NON AFRICAN AMERICAN: 33 mL/min — AB (ref >60)
GLUCOSE: 241 mg/dL — AB (ref 65–99)
Potassium: 3.9 mmol/L (ref 3.5–5.1)
SODIUM: 136 mmol/L (ref 135–145)

## 2016-10-05 LAB — CBC WITH DIFFERENTIAL/PLATELET
BASOS ABS: 0.1 10*3/uL (ref 0.0–0.1)
BASOS PCT: 0 %
EOS ABS: 0.1 10*3/uL (ref 0.0–0.7)
EOS PCT: 0 %
HCT: 36.6 % — ABNORMAL LOW (ref 39.0–52.0)
Hemoglobin: 12.1 g/dL — ABNORMAL LOW (ref 13.0–17.0)
Lymphocytes Relative: 7 %
Lymphs Abs: 1.3 10*3/uL (ref 0.7–4.0)
MCH: 29.2 pg (ref 26.0–34.0)
MCHC: 33.1 g/dL (ref 30.0–36.0)
MCV: 88.2 fL (ref 78.0–100.0)
MONO ABS: 1.5 10*3/uL — AB (ref 0.1–1.0)
Monocytes Relative: 8 %
Neutro Abs: 16.7 10*3/uL — ABNORMAL HIGH (ref 1.7–7.7)
Neutrophils Relative %: 85 %
PLATELETS: 214 10*3/uL (ref 150–400)
RBC: 4.15 MIL/uL — ABNORMAL LOW (ref 4.22–5.81)
RDW: 13.3 % (ref 11.5–15.5)
WBC: 19.6 10*3/uL — ABNORMAL HIGH (ref 4.0–10.5)

## 2016-10-05 LAB — I-STAT TROPONIN, ED
Troponin i, poc: 0.01 ng/mL (ref 0.00–0.08)
Troponin i, poc: 0.01 ng/mL (ref 0.00–0.08)

## 2016-10-05 LAB — BRAIN NATRIURETIC PEPTIDE: B NATRIURETIC PEPTIDE 5: 568.6 pg/mL — AB (ref 0.0–100.0)

## 2016-10-05 MED ORDER — LEVOFLOXACIN 500 MG PO TABS: 500.0000 mg | ORAL_TABLET | Freq: Every day | ORAL | 0 refills | Status: DC

## 2016-10-05 MED ORDER — FUROSEMIDE 40 MG PO TABS: 40.0000 mg | ORAL_TABLET | Freq: Two times a day (BID) | ORAL | 0 refills | Status: DC

## 2016-10-05 NOTE — ED Notes (Signed)
sats went up while walking. Was between 94-96.

## 2016-10-05 NOTE — ED Triage Notes (Signed)
Pt transported by EMS from [facility] for c/o [facility].  Pt received Albuterol neb x 1 and Duoneb x 1. Unable to gain IV access. Pt received Lasix 80mg  IM before leaving facility. Pt states he ate dinner @ 1730 and became [facility] shortly after that. Audible exp wheezing on arrival.  Pt states he feels much better after neb tx

## 2016-10-05 NOTE — Discharge Instructions (Signed)
You were seen today for shortness of breath. This is likely a combination of your heart failure and early pneumonia. You do have an increase in your white blood cell count which could indicate infection. Your chest x-ray shows some evidence of edema. You were given 80 mg of Lasix prior to arrival and had improved upon my evaluation. Your workup is otherwise reassuring. You were able to ambulate and maintain oxygen saturations without supplemental oxygen. He will be discharged home with antibiotics. Increase in Lasix 2 days and follow-up with primary physician.

## 2016-10-05 NOTE — ED Notes (Signed)
Pt returned from xray, assisted to stand at bedside to use urinal

## 2016-10-05 NOTE — ED Notes (Signed)
Report called to [place] at [place].  [facility] called for transport back to SNF
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
## 2016-10-05 NOTE — ED Notes (Signed)
Pt transported to radiology via stretcher 

## 2016-10-19 ENCOUNTER — Inpatient Hospital Stay (HOSPITAL_COMMUNITY)
Admission: EM | Admit: 2016-10-19 | Discharge: 2016-10-21 | DRG: 291 | Disposition: A | Discharge status: Skilled Nursing Facility | Payer: Medicare Other | Attending: Internal Medicine | Admitting: Internal Medicine

## 2016-10-19 DIAGNOSIS — K810 Acute cholecystitis: Secondary | ICD-10-CM | POA: Diagnosis present

## 2016-10-19 DIAGNOSIS — E1122 Type 2 diabetes mellitus with diabetic chronic kidney disease: Secondary | ICD-10-CM | POA: Diagnosis present

## 2016-10-19 DIAGNOSIS — Z66 Do not resuscitate: Secondary | ICD-10-CM | POA: Diagnosis present

## 2016-10-19 DIAGNOSIS — J40 Bronchitis, not specified as acute or chronic: Secondary | ICD-10-CM | POA: Diagnosis present

## 2016-10-19 DIAGNOSIS — Z825 Family history of asthma and other chronic lower respiratory diseases: Secondary | ICD-10-CM

## 2016-10-19 DIAGNOSIS — R932 Abnormal findings on diagnostic imaging of liver and biliary tract: Secondary | ICD-10-CM | POA: Diagnosis present

## 2016-10-19 DIAGNOSIS — I25709 Atherosclerosis of coronary artery bypass graft(s), unspecified, with unspecified angina pectoris: Secondary | ICD-10-CM | POA: Diagnosis present

## 2016-10-19 DIAGNOSIS — X12XXXS Contact with other hot fluids, sequela: Secondary | ICD-10-CM | POA: Diagnosis present

## 2016-10-19 DIAGNOSIS — N184 Chronic kidney disease, stage 4 (severe): Secondary | ICD-10-CM | POA: Diagnosis present

## 2016-10-19 DIAGNOSIS — I5033 Acute on chronic diastolic (congestive) heart failure: Secondary | ICD-10-CM | POA: Diagnosis present

## 2016-10-19 DIAGNOSIS — I2510 Atherosclerotic heart disease of native coronary artery without angina pectoris: Secondary | ICD-10-CM | POA: Diagnosis not present

## 2016-10-19 DIAGNOSIS — D649 Anemia, unspecified: Secondary | ICD-10-CM | POA: Diagnosis present

## 2016-10-19 DIAGNOSIS — E559 Vitamin D deficiency, unspecified: Secondary | ICD-10-CM | POA: Diagnosis present

## 2016-10-19 DIAGNOSIS — Z7984 Long term (current) use of oral hypoglycemic drugs: Secondary | ICD-10-CM

## 2016-10-19 DIAGNOSIS — I10 Essential (primary) hypertension: Secondary | ICD-10-CM | POA: Diagnosis present

## 2016-10-19 DIAGNOSIS — E1151 Type 2 diabetes mellitus with diabetic peripheral angiopathy without gangrene: Secondary | ICD-10-CM | POA: Diagnosis present

## 2016-10-19 DIAGNOSIS — Z951 Presence of aortocoronary bypass graft: Secondary | ICD-10-CM

## 2016-10-19 DIAGNOSIS — Y92019 Unspecified place in single-family (private) house as the place of occurrence of the external cause: Secondary | ICD-10-CM

## 2016-10-19 DIAGNOSIS — R1011 Right upper quadrant pain: Secondary | ICD-10-CM

## 2016-10-19 DIAGNOSIS — I471 Supraventricular tachycardia: Secondary | ICD-10-CM | POA: Diagnosis present

## 2016-10-19 DIAGNOSIS — I130 Hypertensive heart and chronic kidney disease with heart failure and stage 1 through stage 4 chronic kidney disease, or unspecified chronic kidney disease: Principal | ICD-10-CM | POA: Diagnosis present

## 2016-10-19 DIAGNOSIS — Z9114 Patient's other noncompliance with medication regimen: Secondary | ICD-10-CM

## 2016-10-19 DIAGNOSIS — T310 Burns involving less than 10% of body surface: Secondary | ICD-10-CM | POA: Diagnosis present

## 2016-10-19 DIAGNOSIS — I509 Heart failure, unspecified: Secondary | ICD-10-CM

## 2016-10-19 DIAGNOSIS — R0602 Shortness of breath: Secondary | ICD-10-CM | POA: Diagnosis not present

## 2016-10-19 DIAGNOSIS — T24002A Burn of unspecified degree of unspecified site of left lower limb, except ankle and foot, initial encounter: Secondary | ICD-10-CM | POA: Diagnosis present

## 2016-10-19 DIAGNOSIS — E785 Hyperlipidemia, unspecified: Secondary | ICD-10-CM | POA: Diagnosis present

## 2016-10-19 DIAGNOSIS — Z7982 Long term (current) use of aspirin: Secondary | ICD-10-CM

## 2016-10-19 DIAGNOSIS — N400 Enlarged prostate without lower urinary tract symptoms: Secondary | ICD-10-CM | POA: Diagnosis present

## 2016-10-19 DIAGNOSIS — Z79899 Other long term (current) drug therapy: Secondary | ICD-10-CM

## 2016-10-19 DIAGNOSIS — K5790 Diverticulosis of intestine, part unspecified, without perforation or abscess without bleeding: Secondary | ICD-10-CM | POA: Diagnosis present

## 2016-10-19 DIAGNOSIS — Z87891 Personal history of nicotine dependence: Secondary | ICD-10-CM

## 2016-10-19 DIAGNOSIS — E538 Deficiency of other specified B group vitamins: Secondary | ICD-10-CM | POA: Diagnosis present

## 2016-10-20 ENCOUNTER — Emergency Department (HOSPITAL_COMMUNITY): Payer: Medicare Other

## 2016-10-20 ENCOUNTER — Inpatient Hospital Stay (HOSPITAL_COMMUNITY): Payer: Medicare Other

## 2016-10-20 ENCOUNTER — Encounter (HOSPITAL_COMMUNITY): Payer: Self-pay | Admitting: Internal Medicine

## 2016-10-20 DIAGNOSIS — Y92019 Unspecified place in single-family (private) house as the place of occurrence of the external cause: Secondary | ICD-10-CM | POA: Diagnosis not present

## 2016-10-20 DIAGNOSIS — I509 Heart failure, unspecified: Secondary | ICD-10-CM | POA: Diagnosis not present

## 2016-10-20 DIAGNOSIS — K5790 Diverticulosis of intestine, part unspecified, without perforation or abscess without bleeding: Secondary | ICD-10-CM | POA: Diagnosis present

## 2016-10-20 DIAGNOSIS — E0821 Diabetes mellitus due to underlying condition with diabetic nephropathy: Secondary | ICD-10-CM | POA: Diagnosis not present

## 2016-10-20 DIAGNOSIS — T24002A Burn of unspecified degree of unspecified site of left lower limb, except ankle and foot, initial encounter: Secondary | ICD-10-CM | POA: Diagnosis present

## 2016-10-20 DIAGNOSIS — I25701 Atherosclerosis of coronary artery bypass graft(s), unspecified, with angina pectoris with documented spasm: Secondary | ICD-10-CM | POA: Diagnosis not present

## 2016-10-20 DIAGNOSIS — Z7982 Long term (current) use of aspirin: Secondary | ICD-10-CM | POA: Diagnosis not present

## 2016-10-20 DIAGNOSIS — N400 Enlarged prostate without lower urinary tract symptoms: Secondary | ICD-10-CM | POA: Diagnosis present

## 2016-10-20 DIAGNOSIS — Z87891 Personal history of nicotine dependence: Secondary | ICD-10-CM | POA: Diagnosis not present

## 2016-10-20 DIAGNOSIS — J40 Bronchitis, not specified as acute or chronic: Secondary | ICD-10-CM | POA: Diagnosis present

## 2016-10-20 DIAGNOSIS — E785 Hyperlipidemia, unspecified: Secondary | ICD-10-CM | POA: Diagnosis present

## 2016-10-20 DIAGNOSIS — I471 Supraventricular tachycardia: Secondary | ICD-10-CM | POA: Diagnosis present

## 2016-10-20 DIAGNOSIS — R0602 Shortness of breath: Secondary | ICD-10-CM | POA: Diagnosis present

## 2016-10-20 DIAGNOSIS — I10 Essential (primary) hypertension: Secondary | ICD-10-CM

## 2016-10-20 DIAGNOSIS — I130 Hypertensive heart and chronic kidney disease with heart failure and stage 1 through stage 4 chronic kidney disease, or unspecified chronic kidney disease: Secondary | ICD-10-CM | POA: Diagnosis present

## 2016-10-20 DIAGNOSIS — I2510 Atherosclerotic heart disease of native coronary artery without angina pectoris: Secondary | ICD-10-CM | POA: Diagnosis present

## 2016-10-20 DIAGNOSIS — D649 Anemia, unspecified: Secondary | ICD-10-CM | POA: Diagnosis present

## 2016-10-20 DIAGNOSIS — Z951 Presence of aortocoronary bypass graft: Secondary | ICD-10-CM | POA: Diagnosis not present

## 2016-10-20 DIAGNOSIS — K810 Acute cholecystitis: Secondary | ICD-10-CM | POA: Diagnosis present

## 2016-10-20 DIAGNOSIS — N184 Chronic kidney disease, stage 4 (severe): Secondary | ICD-10-CM | POA: Diagnosis present

## 2016-10-20 DIAGNOSIS — Z7984 Long term (current) use of oral hypoglycemic drugs: Secondary | ICD-10-CM | POA: Diagnosis not present

## 2016-10-20 DIAGNOSIS — I5033 Acute on chronic diastolic (congestive) heart failure: Secondary | ICD-10-CM | POA: Diagnosis not present

## 2016-10-20 DIAGNOSIS — Z66 Do not resuscitate: Secondary | ICD-10-CM | POA: Diagnosis present

## 2016-10-20 DIAGNOSIS — R932 Abnormal findings on diagnostic imaging of liver and biliary tract: Secondary | ICD-10-CM | POA: Diagnosis present

## 2016-10-20 DIAGNOSIS — X12XXXS Contact with other hot fluids, sequela: Secondary | ICD-10-CM | POA: Diagnosis present

## 2016-10-20 DIAGNOSIS — Z9114 Patient's other noncompliance with medication regimen: Secondary | ICD-10-CM | POA: Diagnosis not present

## 2016-10-20 DIAGNOSIS — E1122 Type 2 diabetes mellitus with diabetic chronic kidney disease: Secondary | ICD-10-CM | POA: Diagnosis present

## 2016-10-20 DIAGNOSIS — T310 Burns involving less than 10% of body surface: Secondary | ICD-10-CM | POA: Diagnosis present

## 2016-10-20 DIAGNOSIS — Z825 Family history of asthma and other chronic lower respiratory diseases: Secondary | ICD-10-CM | POA: Diagnosis not present

## 2016-10-20 DIAGNOSIS — E1151 Type 2 diabetes mellitus with diabetic peripheral angiopathy without gangrene: Secondary | ICD-10-CM | POA: Diagnosis present

## 2016-10-20 DIAGNOSIS — Z79899 Other long term (current) drug therapy: Secondary | ICD-10-CM | POA: Diagnosis not present

## 2016-10-20 LAB — CBC WITH DIFFERENTIAL/PLATELET
BASOS ABS: 0 10*3/uL (ref 0.0–0.1)
BASOS ABS: 0 10*3/uL (ref 0.0–0.1)
BASOS PCT: 0 %
BASOS PCT: 0 %
EOS ABS: 0 10*3/uL (ref 0.0–0.7)
EOS PCT: 0 %
EOS PCT: 0 %
Eosinophils Absolute: 0 10*3/uL (ref 0.0–0.7)
HCT: 30.6 % — ABNORMAL LOW (ref 39.0–52.0)
HCT: 33.3 % — ABNORMAL LOW (ref 39.0–52.0)
Hemoglobin: 11.1 g/dL — ABNORMAL LOW (ref 13.0–17.0)
Hemoglobin: 9.9 g/dL — ABNORMAL LOW (ref 13.0–17.0)
LYMPHS PCT: 6 %
LYMPHS PCT: 9 %
Lymphs Abs: 1.2 10*3/uL (ref 0.7–4.0)
Lymphs Abs: 1.4 10*3/uL (ref 0.7–4.0)
MCH: 28.3 pg (ref 26.0–34.0)
MCH: 29.3 pg (ref 26.0–34.0)
MCHC: 32.4 g/dL (ref 30.0–36.0)
MCHC: 33.3 g/dL (ref 30.0–36.0)
MCV: 87.4 fL (ref 78.0–100.0)
MCV: 87.9 fL (ref 78.0–100.0)
MONO ABS: 1.2 10*3/uL — AB (ref 0.1–1.0)
MONO ABS: 1.4 10*3/uL — AB (ref 0.1–1.0)
MONOS PCT: 6 %
Monocytes Relative: 9 %
Neutro Abs: 11.4 10*3/uL — ABNORMAL HIGH (ref 1.7–7.7)
Neutro Abs: 18.6 10*3/uL — ABNORMAL HIGH (ref 1.7–7.7)
Neutrophils Relative %: 82 %
Neutrophils Relative %: 88 %
PLATELETS: 158 10*3/uL (ref 150–400)
PLATELETS: 181 10*3/uL (ref 150–400)
RBC: 3.5 MIL/uL — AB (ref 4.22–5.81)
RBC: 3.79 MIL/uL — ABNORMAL LOW (ref 4.22–5.81)
RDW: 13.2 % (ref 11.5–15.5)
RDW: 13.6 % (ref 11.5–15.5)
WBC: 13.9 10*3/uL — AB (ref 4.0–10.5)
WBC: 21.5 10*3/uL — ABNORMAL HIGH (ref 4.0–10.5)

## 2016-10-20 LAB — I-STAT TROPONIN, ED: TROPONIN I, POC: 0.01 ng/mL (ref 0.00–0.08)

## 2016-10-20 LAB — GLUCOSE, CAPILLARY
GLUCOSE-CAPILLARY: 232 mg/dL — AB (ref 65–99)
GLUCOSE-CAPILLARY: 213 mg/dL — AB (ref 65–99)
Glucose-Capillary: 239 mg/dL — ABNORMAL HIGH (ref 65–99)
Glucose-Capillary: 146 mg/dL — ABNORMAL HIGH (ref 65–99)

## 2016-10-20 LAB — HEPATIC FUNCTION PANEL
ALK PHOS: 77 U/L (ref 38–126)
ALT: 39 U/L (ref 17–63)
AST: 73 U/L — ABNORMAL HIGH (ref 15–41)
Albumin: 3.5 g/dL (ref 3.5–5.0)
BILIRUBIN DIRECT: 0.3 mg/dL (ref 0.1–0.5)
BILIRUBIN INDIRECT: 0.7 mg/dL (ref 0.3–0.9)
BILIRUBIN TOTAL: 1 mg/dL (ref 0.3–1.2)
Total Protein: 7.2 g/dL (ref 6.5–8.1)

## 2016-10-20 LAB — BRAIN NATRIURETIC PEPTIDE: B Natriuretic Peptide: 658.4 pg/mL — ABNORMAL HIGH (ref 0.0–100.0)

## 2016-10-20 LAB — COMPREHENSIVE METABOLIC PANEL
ALBUMIN: 3.3 g/dL — AB (ref 3.5–5.0)
ALK PHOS: 64 U/L (ref 38–126)
ALT: 33 U/L (ref 17–63)
AST: 41 U/L (ref 15–41)
Anion gap: 11 (ref 5–15)
BILIRUBIN TOTAL: 1.3 mg/dL — AB (ref 0.3–1.2)
BUN: 29 mg/dL — AB (ref 6–20)
CALCIUM: 8.8 mg/dL — AB (ref 8.9–10.3)
CO2: 26 mmol/L (ref 22–32)
CREATININE: 1.65 mg/dL — AB (ref 0.61–1.24)
Chloride: 99 mmol/L — ABNORMAL LOW (ref 101–111)
GFR calc Af Amer: 39 mL/min — ABNORMAL LOW (ref >60)
GFR, EST NON AFRICAN AMERICAN: 34 mL/min — AB (ref >60)
GLUCOSE: 231 mg/dL — AB (ref 65–99)
POTASSIUM: 3.7 mmol/L (ref 3.5–5.1)
Sodium: 136 mmol/L (ref 135–145)
TOTAL PROTEIN: 6.5 g/dL (ref 6.5–8.1)

## 2016-10-20 LAB — BASIC METABOLIC PANEL
Anion gap: 10 (ref 5–15)
BUN: 28 mg/dL — AB (ref 6–20)
CALCIUM: 9.1 mg/dL (ref 8.9–10.3)
CHLORIDE: 103 mmol/L (ref 101–111)
CO2: 23 mmol/L (ref 22–32)
CREATININE: 1.56 mg/dL — AB (ref 0.61–1.24)
GFR calc non Af Amer: 36 mL/min — ABNORMAL LOW (ref >60)
GFR, EST AFRICAN AMERICAN: 42 mL/min — AB (ref >60)
GLUCOSE: 233 mg/dL — AB (ref 65–99)
Potassium: 4.2 mmol/L (ref 3.5–5.1)
Sodium: 136 mmol/L (ref 135–145)

## 2016-10-20 LAB — ECHOCARDIOGRAM COMPLETE
Height: 68 in
WEIGHTICAEL: 3079.39 [oz_av]

## 2016-10-20 LAB — I-STAT CHEM 8, ED
BUN: 36 mg/dL — ABNORMAL HIGH (ref 6–20)
CALCIUM ION: 1.16 mmol/L (ref 1.15–1.40)
Chloride: 101 mmol/L (ref 101–111)
Creatinine, Ser: 1.6 mg/dL — ABNORMAL HIGH (ref 0.61–1.24)
Glucose, Bld: 240 mg/dL — ABNORMAL HIGH (ref 65–99)
HEMATOCRIT: 33 % — AB (ref 39.0–52.0)
HEMOGLOBIN: 11.2 g/dL — AB (ref 13.0–17.0)
Potassium: 4.2 mmol/L (ref 3.5–5.1)
SODIUM: 138 mmol/L (ref 135–145)
TCO2: 25 mmol/L (ref 0–100)

## 2016-10-20 LAB — TROPONIN I
TROPONIN I: 0.03 ng/mL — AB (ref <0.03)
TROPONIN I: 0.04 ng/mL — AB (ref <0.03)

## 2016-10-20 LAB — I-STAT CG4 LACTIC ACID, ED: Lactic Acid, Venous: 1.61 mmol/L (ref 0.5–1.9)

## 2016-10-20 LAB — MAGNESIUM: Magnesium: 1.8 mg/dL (ref 1.7–2.4)

## 2016-10-20 LAB — MRSA PCR SCREENING: MRSA BY PCR: POSITIVE — AB

## 2016-10-20 MED ORDER — FLUTICASONE PROPIONATE 50 MCG/ACT NA SUSP
1.0000 | Freq: Two times a day (BID) | NASAL | Status: DC
  Administered 2016-10-20 – 2016-10-21 (×3): 1 via NASAL
  Filled 2016-10-20: qty 16

## 2016-10-20 MED ORDER — VITAMIN B-12 1000 MCG PO TABS
1000.0000 ug | ORAL_TABLET | Freq: Every day | ORAL | Status: DC
  Administered 2016-10-20 – 2016-10-21 (×2): 1000 ug via ORAL
  Filled 2016-10-20 (×2): qty 1

## 2016-10-20 MED ORDER — NITROGLYCERIN 0.4 MG SL SUBL: 0.4000 mg | SUBLINGUAL_TABLET | SUBLINGUAL | Status: DC | PRN

## 2016-10-20 MED ORDER — FUROSEMIDE 10 MG/ML IJ SOLN
40.0000 mg | Freq: Every day | INTRAMUSCULAR | Status: DC
  Administered 2016-10-20: 40 mg via INTRAVENOUS
  Filled 2016-10-20: qty 4

## 2016-10-20 MED ORDER — NITROGLYCERIN 2 % TD OINT
0.5000 [in_us] | TOPICAL_OINTMENT | Freq: Four times a day (QID) | TRANSDERMAL | Status: DC
  Administered 2016-10-20 – 2016-10-21 (×4): 0.5 [in_us] via TOPICAL
  Filled 2016-10-20: qty 30

## 2016-10-20 MED ORDER — ASPIRIN EC 81 MG PO TBEC
81.0000 mg | DELAYED_RELEASE_TABLET | Freq: Every day | ORAL | Status: DC
  Administered 2016-10-20 – 2016-10-21 (×2): 81 mg via ORAL
  Filled 2016-10-20 (×2): qty 1

## 2016-10-20 MED ORDER — POTASSIUM CHLORIDE CRYS ER 20 MEQ PO TBCR
40.0000 meq | EXTENDED_RELEASE_TABLET | Freq: Once | ORAL | Status: AC
  Administered 2016-10-20: 40 meq via ORAL
  Filled 2016-10-20: qty 2

## 2016-10-20 MED ORDER — MUPIROCIN 2 % EX OINT
1.0000 "application " | TOPICAL_OINTMENT | Freq: Two times a day (BID) | CUTANEOUS | Status: DC
  Administered 2016-10-20 – 2016-10-21 (×2): 1 via NASAL
  Filled 2016-10-20: qty 22

## 2016-10-20 MED ORDER — MAGNESIUM SULFATE IN D5W 1-5 GM/100ML-% IV SOLN
1.0000 g | Freq: Once | INTRAVENOUS | Status: AC
  Administered 2016-10-20: 1 g via INTRAVENOUS
  Filled 2016-10-20: qty 100

## 2016-10-20 MED ORDER — ACETAMINOPHEN 650 MG RE SUPP
650.0000 mg | Freq: Four times a day (QID) | RECTAL | Status: DC | PRN
Start: 2016-10-20 — End: 2016-10-21

## 2016-10-20 MED ORDER — LATANOPROST 0.005 % OP SOLN
1.0000 [drp] | Freq: Every day | OPHTHALMIC | Status: DC
  Administered 2016-10-20: 1 [drp] via OPHTHALMIC
  Filled 2016-10-20: qty 2.5

## 2016-10-20 MED ORDER — PIPERACILLIN-TAZOBACTAM 3.375 G IVPB 30 MIN
3.3750 g | Freq: Once | INTRAVENOUS | Status: AC
  Administered 2016-10-20: 3.375 g via INTRAVENOUS
  Filled 2016-10-20: qty 50

## 2016-10-20 MED ORDER — METOPROLOL TARTRATE 50 MG PO TABS
50.0000 mg | ORAL_TABLET | Freq: Two times a day (BID) | ORAL | Status: DC
  Administered 2016-10-20 – 2016-10-21 (×3): 50 mg via ORAL
  Filled 2016-10-20 (×3): qty 1

## 2016-10-20 MED ORDER — LISINOPRIL 10 MG PO TABS
20.0000 mg | ORAL_TABLET | Freq: Every day | ORAL | Status: DC
  Administered 2016-10-20 – 2016-10-21 (×2): 20 mg via ORAL
  Filled 2016-10-20 (×2): qty 2

## 2016-10-20 MED ORDER — LORATADINE 10 MG PO TABS
10.0000 mg | ORAL_TABLET | Freq: Every day | ORAL | Status: DC
  Administered 2016-10-20: 10 mg via ORAL
  Filled 2016-10-20: qty 1

## 2016-10-20 MED ORDER — NOREPINEPHRINE BITARTRATE 1 MG/ML IV SOLN: 2.0000 ug/min | INTRAVENOUS | Status: DC

## 2016-10-20 MED ORDER — INSULIN ASPART 100 UNIT/ML ~~LOC~~ SOLN
0.0000 [IU] | Freq: Three times a day (TID) | SUBCUTANEOUS | Status: DC
Start: 2016-10-20 — End: 2016-10-21
  Administered 2016-10-20: 1 [IU] via SUBCUTANEOUS
  Administered 2016-10-20 (×2): 3 [IU] via SUBCUTANEOUS
  Administered 2016-10-21: 2 [IU] via SUBCUTANEOUS
  Administered 2016-10-21: 3 [IU] via SUBCUTANEOUS

## 2016-10-20 MED ORDER — TECHNETIUM TC 99M MEBROFENIN IV KIT
5.0000 | PACK | Freq: Once | INTRAVENOUS | Status: AC | PRN
  Administered 2016-10-20: 5 via INTRAVENOUS

## 2016-10-20 MED ORDER — ROSUVASTATIN CALCIUM 10 MG PO TABS: 5.0000 mg | ORAL_TABLET | ORAL | Status: DC

## 2016-10-20 MED ORDER — LORATADINE 10 MG PO TABS: 10.0000 mg | ORAL_TABLET | Freq: Every day | ORAL | Status: DC | PRN

## 2016-10-20 MED ORDER — PIPERACILLIN-TAZOBACTAM 3.375 G IVPB
3.3750 g | Freq: Three times a day (TID) | INTRAVENOUS | Status: DC
  Administered 2016-10-20: 3.375 g via INTRAVENOUS
  Filled 2016-10-20 (×2): qty 50

## 2016-10-20 MED ORDER — CHLORHEXIDINE GLUCONATE CLOTH 2 % EX PADS
6.0000 | MEDICATED_PAD | Freq: Every day | CUTANEOUS | Status: DC
  Administered 2016-10-21: 6 via TOPICAL

## 2016-10-20 MED ORDER — TAMSULOSIN HCL 0.4 MG PO CAPS
0.4000 mg | ORAL_CAPSULE | Freq: Every day | ORAL | Status: DC
  Administered 2016-10-20: 0.4 mg via ORAL
  Filled 2016-10-20: qty 1

## 2016-10-20 MED ORDER — HEPARIN SODIUM (PORCINE) 5000 UNIT/ML IJ SOLN
5000.0000 [IU] | Freq: Three times a day (TID) | INTRAMUSCULAR | Status: DC
  Administered 2016-10-20 – 2016-10-21 (×3): 5000 [IU] via SUBCUTANEOUS
  Filled 2016-10-20 (×3): qty 1

## 2016-10-20 MED ORDER — FUROSEMIDE 10 MG/ML IJ SOLN
80.0000 mg | Freq: Once | INTRAMUSCULAR | Status: AC
  Administered 2016-10-20: 80 mg via INTRAVENOUS
  Filled 2016-10-20: qty 8

## 2016-10-20 MED ORDER — SODIUM CHLORIDE 0.9 % IV BOLUS (SEPSIS): 1000.0000 mL | Freq: Once | INTRAVENOUS | Status: DC

## 2016-10-20 MED ORDER — ONDANSETRON HCL 4 MG/2ML IJ SOLN: 4.0000 mg | Freq: Four times a day (QID) | INTRAMUSCULAR | Status: DC | PRN

## 2016-10-20 MED ORDER — LEVOFLOXACIN 500 MG PO TABS
500.0000 mg | ORAL_TABLET | Freq: Every day | ORAL | Status: DC
  Administered 2016-10-20: 500 mg via ORAL
  Filled 2016-10-20: qty 1

## 2016-10-20 MED ORDER — AMLODIPINE BESYLATE 5 MG PO TABS
5.0000 mg | ORAL_TABLET | Freq: Every day | ORAL | Status: DC
  Administered 2016-10-20 – 2016-10-21 (×2): 5 mg via ORAL
  Filled 2016-10-20 (×2): qty 1

## 2016-10-20 MED ORDER — ACETAMINOPHEN 325 MG PO TABS
650.0000 mg | ORAL_TABLET | Freq: Four times a day (QID) | ORAL | Status: DC | PRN
Start: 2016-10-20 — End: 2016-10-21

## 2016-10-20 MED ORDER — ONDANSETRON HCL 4 MG PO TABS: 4.0000 mg | ORAL_TABLET | Freq: Four times a day (QID) | ORAL | Status: DC | PRN

## 2016-10-20 MED ORDER — FUROSEMIDE 10 MG/ML IJ SOLN
40.0000 mg | Freq: Two times a day (BID) | INTRAMUSCULAR | Status: DC
  Administered 2016-10-20 – 2016-10-21 (×2): 40 mg via INTRAVENOUS
  Filled 2016-10-20 (×2): qty 4

## 2016-10-20 NOTE — Progress Notes (Signed)
Pharmacy Antibiotic Note  Cole [name] is a 82 y.o. male admitted on 10/19/2016 with CHF exacerbation and possible acute cholecystitis.  Pharmacy has been consulted for Zosyn dosing.  Plan: Zosyn 3.375g IV q8h (4 hour infusion).  Height: 5\' 8"  (172.7 cm) Weight: 193 lb (87.5 kg) IBW/kg (Calculated) : 68.4  Temp (24hrs), Avg:98.4 F (36.9 C), Min:98.4 F (36.9 C), Max:98.4 F (36.9 C)   Recent Labs Lab 10/20/16 0007 10/20/16 0046 10/20/16 0047  WBC 21.5*  --   --   CREATININE 1.56* 1.60*  --   LATICACIDVEN  --   --  1.61    Estimated Creatinine Clearance: 30.3 mL/min (by C-G formula based on SCr of 1.6 mg/dL (H)).    No Known Allergies   Thank you for allowing pharmacy to be a part of this patient's care.  Wynona Neat, PharmD, BCPS  10/20/2016 6:12 AM
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
## 2016-10-20 NOTE — ED Triage Notes (Signed)
Pt from [place] via [place]. Pt c/o SHOB that started earlier tonight. Per EMS pt was in respiratory distress when they got to him. They stated he had diminished lung sounds. Pt was given 10 mg Albuterol 0.5mg  of Atrovent. Pt has hx of CHF. Denies pain @ this time.
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
## 2016-10-20 NOTE — Care Management Note (Addendum)
Case Management Note Marvetta Gibbons RN, BSN Unit 2W-Case Manager [phone]  Patient Details  Name: Cole [name] MRN: [id number] Date of Birth: 10/19/1920  Subjective/Objective:   Pt admitted with acute on chronic HF                 Action/Plan: PTA pt was at [place] consulted- pt has appointment at [facility] wound center for specialized wound care- will need to d/c as soon as possible in order to get to [facility] for appointment.   Expected Discharge Date:  10/21/16               Expected Discharge Plan:  Skilled Nursing Facility  In-House Referral:  Clinical Social Work  Discharge planning Services  CM Consult  Post Acute Care Choice:    Choice offered to:     DME Arranged:    DME Agency:     HH Arranged:    [place] Agency:     Status of Service:  In process, will continue to follow  If discussed at Long Length of Stay Meetings, dates discussed:    Additional Comments:  Dawayne Patricia, RN 10/20/2016, 11:04 AM
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
## 2016-10-20 NOTE — Progress Notes (Signed)
Pt arrived to 2w07 via stretcher. Pt AOx4, does not complain of pain of SOB at this time. Tele box placed, CCMD notified, verified x2. Pt updated on plan of care. Oriented to room. Call bell and phone within reach.  Jaymes Graff, RN

## 2016-10-20 NOTE — Progress Notes (Signed)
Pharmacy Antibiotic Note  Cole [name] is a 82 y.o. male admitted on 10/19/2016.  Pharmacy has been consulted for levaquin dosing for CAP.  WBC 13.9, creat 1.69. AF. Creat 1.65, Creat cl 33 ml/min. He has gotten 2 doses of zosyn.  Plan: levaquin 500 mg po q24 Pharmacy to sign off  Height: 5\' 8"  (172.7 cm) Weight: 192 lb 7.4 oz (87.3 kg) IBW/kg (Calculated) : 68.4  Temp (24hrs), Avg:98.6 F (37 C), Min:98.2 F (36.8 C), Max:99 F (37.2 C)   Recent Labs Lab 10/20/16 0007 10/20/16 0046 10/20/16 0047 10/20/16 0717  WBC 21.5*  --   --  13.9*  CREATININE 1.56* 1.60*  --  1.65*  LATICACIDVEN  --   --  1.61  --     Estimated Creatinine Clearance: 29.4 mL/min (by C-G formula based on SCr of 1.65 mg/dL (H)).    No Known Allergies   Antimicrobials this admission:  Zosyn 12/5 >>12/5 Lvq 12/5>>  Dose adjustments this admission:  N/A  Microbiology results:  12/5 BCx x2 -  Eudelia Bunch, Pharm.D. [phone] 10/20/2016 1:56 PM
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
## 2016-10-20 NOTE — Progress Notes (Signed)
CRITICAL VALUE ALERT  Critical value received:  Troponin 0.04  Date of notification:  10/20/16  Time of notification:  2000  Critical value read back:Yes.    Nurse who received alert:  [place]  MD notified (1st page):  [place]  Time of first page:  2004  MD notified (2nd page): [place]  Time of second page: 2104  Responding MD:    Time MD responded:

## 2016-10-20 NOTE — Progress Notes (Signed)
CRITICAL VALUE ALERT  Critical value received:  1630  Date of notification:  10/20/2016   Time of notification:  3174  Critical value read back yes  Nurse who received alert:  Kristin Bruins RN   MD notified (1st page):  Ronnie Derby MD  Time of first page:  1640  MD notified (2nd page):

## 2016-10-20 NOTE — Progress Notes (Signed)
CRITICAL VALUE ALERT  Critical value received:  troponin  Date of notification:  10/20/2016  Time of notification:  0830  Critical value read back: yes  Nurse who received alert:  Kristin Bruins RN  MD notified (1st page):  Ronnie Derby  Time of first page:  [phone]
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
## 2016-10-20 NOTE — Progress Notes (Signed)
  Echocardiogram 2D Echocardiogram has been performed.  Cole [name] L Androw 10/20/2016, 12:16 PM
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
## 2016-10-21 LAB — BASIC METABOLIC PANEL
ANION GAP: 9 (ref 5–15)
BUN: 30 mg/dL — ABNORMAL HIGH (ref 6–20)
CALCIUM: 8.6 mg/dL — AB (ref 8.9–10.3)
CO2: 28 mmol/L (ref 22–32)
Chloride: 101 mmol/L (ref 101–111)
Creatinine, Ser: 1.73 mg/dL — ABNORMAL HIGH (ref 0.61–1.24)
GFR, EST AFRICAN AMERICAN: 37 mL/min — AB (ref >60)
GFR, EST NON AFRICAN AMERICAN: 32 mL/min — AB (ref >60)
Glucose, Bld: 132 mg/dL — ABNORMAL HIGH (ref 65–99)
POTASSIUM: 4 mmol/L (ref 3.5–5.1)
SODIUM: 138 mmol/L (ref 135–145)

## 2016-10-21 LAB — CBC
HEMATOCRIT: 27.1 % — AB (ref 39.0–52.0)
HEMOGLOBIN: 8.9 g/dL — AB (ref 13.0–17.0)
MCH: 28.8 pg (ref 26.0–34.0)
MCHC: 32.8 g/dL (ref 30.0–36.0)
MCV: 87.7 fL (ref 78.0–100.0)
Platelets: 173 10*3/uL (ref 150–400)
RBC: 3.09 MIL/uL — ABNORMAL LOW (ref 4.22–5.81)
RDW: 13.6 % (ref 11.5–15.5)
WBC: 9 10*3/uL (ref 4.0–10.5)

## 2016-10-21 LAB — GLUCOSE, CAPILLARY
GLUCOSE-CAPILLARY: 160 mg/dL — AB (ref 65–99)
GLUCOSE-CAPILLARY: 245 mg/dL — AB (ref 65–99)

## 2016-10-21 LAB — MAGNESIUM: MAGNESIUM: 2.1 mg/dL (ref 1.7–2.4)

## 2016-10-21 MED ORDER — HYDRALAZINE HCL 10 MG PO TABS: 10.0000 mg | ORAL_TABLET | Freq: Two times a day (BID) | ORAL | Status: DC

## 2016-10-21 MED ORDER — FUROSEMIDE 40 MG PO TABS: 80.0000 mg | ORAL_TABLET | Freq: Every day | ORAL | 0 refills | Status: DC

## 2016-10-21 MED ORDER — LEVOFLOXACIN 500 MG PO TABS: 500.0000 mg | ORAL_TABLET | Freq: Every day | ORAL | 0 refills | Status: DC

## 2016-10-21 MED ORDER — HYDRALAZINE HCL 10 MG PO TABS: 10.0000 mg | ORAL_TABLET | Freq: Two times a day (BID) | ORAL | 0 refills | Status: DC

## 2016-10-21 MED ORDER — GLIPIZIDE 5 MG PO TABS: 2.5000 mg | ORAL_TABLET | Freq: Every day | ORAL | 0 refills | Status: DC
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
## 2016-10-21 NOTE — Consult Note (Signed)
WOC note in chart from 10/20/16. WOC contacted by hospitalist today for follow up on the TCC on the LLE.  I have explained to the MD the rationale for not removing the TCC, [facility] nurses have no saw and are not training to remove the cast.  I have contacted Orthopedic tech and [facility] to question ability to remove, they are not able to remove (ortho tech not trained). [place] does not provide inpatient services.  Met with MD on the department, contacted the ED MD's to see if they have ever removed TCC and Dr. Regenia Skeeter reports they are not, but to ask orthopedic techs who I have discussed with already.  Contacted the [facility] to notify them of the patient's DC today and they have worked patient in for an appt. Tomorrow at 12:45pm. Met with patient and notified him of the same and then met with his nephew to explain this as well.  Nephew verbalized appreciation for WOC's work on this.  MD has also contacted orthopedic service about removal however after patient and nephew notified of appointment with [facility] tomorrow they seem to be satisfied with current POC. MD will not ask for any further assistance at this time from orthopedic service.  [place] notified CM of appointment with [facility] and [facility] tomorrow at 12:45pm, this should be passed on to the SNF.  Nephew BW Malen Gauze is also aware of appointment and reports he will communicate with SNF and with the patient's son about the appointment and will make sure the patient gets to this appointment.  Discussed POC with patient and bedside nurse.  Re consult if needed, will not follow at this time. Thanks  Fani Rotondo [employer], RN,CWOCN, CNS [phone])

## 2016-10-21 NOTE — Progress Notes (Addendum)
RN page to MD about pt abdomen, abdomen rounded and slightly taunt. A little soft to touch.  LBM today, this morning, pt denies pain when Rn touches Abdmonen. MD called, MD to come assess

## 2016-10-21 NOTE — Progress Notes (Signed)
Clinical Social Worker met patient at bedside. Patient stated that he lives at [facility] and [facility] and would like to return home. Patient is concern about the cast on his left leg and would like it removed. CSW explained to patient unfortunately cast would have to stay on until patient would return to [facility]. Case manager Steffanie Dunn scheduled an earlier appointment for patient to return to [facility]. Appointment is scheduled for tomorrow 10/22/16 at 12:45pm. Helene Kelp is made aware of patients D/C and of him coming back as well as his appointment for [facility] MSW,  [place]

## 2016-10-21 NOTE — Progress Notes (Signed)
Report called to Gillermina Phy, LPN at [facility]. She was informed of patients appt tomorrow to get cast taken off and patient sent with 5 prescriptions, prescriptions given to family
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
## 2016-10-21 NOTE — Care Management Note (Signed)
Case Management Note Marvetta Gibbons RN, BSN Unit 2W-Case Manager [phone]  Patient Details  Name: Cole [name] MRN: [id number] Date of Birth: 1920/05/18  Subjective/Objective:   Pt admitted with acute on chronic HF                 Action/Plan: PTA pt was at [facility] consulted- pt has appointment at [facility] wound center for specialized wound care- will need to d/c as soon as possible in order to get to [facility] for appointment.   Expected Discharge Date:  10/21/16               Expected Discharge Plan:  Skilled Nursing Facility  In-House Referral:  Clinical Social Work  Discharge planning Services  CM Consult  Post Acute Care Choice:    Choice offered to:     DME Arranged:    DME Agency:     HH Arranged:    [place] Agency:     Status of Service:  Completed, signed off  If discussed at [employer], dates discussed:    Discharge Disposition: return to skilled facility   Additional Comments:  10/21/16- 1200- Marvetta Gibbons RN, CM- notified by MD that pt ready for d/c back to [facility] today- request to schedule pt an earlier appointment at [facility]- (pt has an appointment for 12/13)- call made to [facility] at [phone]- msg left for them regarding need for earlier appointment as pt missed his appointment this week on 12/5- received call from [facility]- regarding wound appointment- she was able to reach [facility] yesterday and made an appointment for 10/22/16 at 12:45- have placed this on AVS and alerted CSW to let SNF know of appointment.   Dawayne Patricia, RN 10/21/2016, 2:07 PM

## 2016-10-21 NOTE — Progress Notes (Signed)
Clinical Social Worker facilitated patient discharge including contacting patient family and facility to confirm patient discharge plans.  Clinical information faxed to facility and family agreeable with plan.  Patient stated to CSW that nephew B.W will drive him back to [phone]).  RN Ciara to call report prior to discharge.  Clinical Social Worker will sign off for now as social work intervention is no longer needed. Please consult [place] again if new need arises.  Rhea Pink, MSW, [place]

## 2016-10-22 ENCOUNTER — Non-Acute Institutional Stay: Payer: Medicare Other | Admitting: Internal Medicine

## 2016-10-22 ENCOUNTER — Encounter: Payer: Self-pay | Admitting: Internal Medicine

## 2016-10-22 DIAGNOSIS — R932 Abnormal findings on diagnostic imaging of liver and biliary tract: Secondary | ICD-10-CM | POA: Diagnosis not present

## 2016-10-22 DIAGNOSIS — I5032 Chronic diastolic (congestive) heart failure: Secondary | ICD-10-CM | POA: Diagnosis not present

## 2016-10-22 DIAGNOSIS — E0821 Diabetes mellitus due to underlying condition with diabetic nephropathy: Secondary | ICD-10-CM | POA: Diagnosis not present

## 2016-10-22 NOTE — Assessment & Plan Note (Addendum)
An  A1c of 8% or less  is the safest goal; it is critical to prevent hypoglycemia. Recheck A1c & BMET off metformin
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
## 2016-10-22 NOTE — Assessment & Plan Note (Signed)
Avoid high fat intake, discussed with patient

## 2016-10-22 NOTE — Patient Instructions (Signed)
See Current Assessment & Plan in Problem List under specific Diagnosis 

## 2016-10-22 NOTE — Assessment & Plan Note (Addendum)
The high-dose furosemide will be continued. Need for this dose of medication was explained to the patient. In the past he has requested that it be held. This is not an option with his diastolic failure. Additionally he had two supplies of table salt at the bedside. He maintains he is not eating salt, but he refused to have these removed from his room. I explained the role of sodium intake in exacerbation of heart failure. I asked him to give the salt to one of his visitors who does not have cardiac issues. No Salt substitute recommended

## 2016-10-25 LAB — CULTURE, BLOOD (ROUTINE X 2)
Culture: NO GROWTH
Culture: NO GROWTH

## 2016-10-26 LAB — CBC AND DIFFERENTIAL
HEMATOCRIT: 31 % — AB (ref 41–53)
HEMOGLOBIN: 10.2 g/dL — AB (ref 13.5–17.5)
Platelets: 213 10*3/uL (ref 150–399)
WBC: 8.9 10^3/mL

## 2016-10-26 LAB — HEMOGLOBIN A1C: HEMOGLOBIN A1C: 8

## 2016-10-26 LAB — BASIC METABOLIC PANEL
BUN: 35 mg/dL — AB (ref 4–21)
Creatinine: 1.6 mg/dL — AB (ref 0.6–1.3)
Glucose: 150 mg/dL
POTASSIUM: 4.3 mmol/L (ref 3.4–5.3)
SODIUM: 141 mmol/L (ref 137–147)

## 2016-10-30 ENCOUNTER — Ambulatory Visit: Payer: Medicare Other | Admitting: Cardiology

## 2016-11-04 ENCOUNTER — Encounter: Payer: Self-pay | Admitting: Physician Assistant

## 2016-11-04 ENCOUNTER — Ambulatory Visit (INDEPENDENT_AMBULATORY_CARE_PROVIDER_SITE_OTHER): Payer: Medicare Other | Admitting: Physician Assistant

## 2016-11-04 VITALS — BP 142/54 | HR 70 | Ht 67.0 in | Wt 196.8 lb

## 2016-11-04 DIAGNOSIS — I5032 Chronic diastolic (congestive) heart failure: Secondary | ICD-10-CM | POA: Diagnosis not present

## 2016-11-04 DIAGNOSIS — I2510 Atherosclerotic heart disease of native coronary artery without angina pectoris: Secondary | ICD-10-CM

## 2016-11-04 DIAGNOSIS — I480 Paroxysmal atrial fibrillation: Secondary | ICD-10-CM

## 2016-11-04 DIAGNOSIS — E7800 Pure hypercholesterolemia, unspecified: Secondary | ICD-10-CM | POA: Insufficient documentation

## 2016-11-04 DIAGNOSIS — I110 Hypertensive heart disease with heart failure: Secondary | ICD-10-CM | POA: Diagnosis not present

## 2016-11-04 MED ORDER — APIXABAN 2.5 MG PO TABS: 2.5000 mg | ORAL_TABLET | Freq: Two times a day (BID) | ORAL | Status: DC
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
## 2016-11-04 NOTE — Patient Instructions (Addendum)
Medication Instructions:  1. STOP ASPIRIN 2. START 11/05/16 ELIQUIS 2.5 MG DAILY 3. INCREASE LASIX TO 80 MG IN THE MORNING AND 40 MG IN THE PM FOR 1 WEEK THEN CHANGE TO LASIX 80 MG DAILY  Labwork: IN 1 WEEK YOU WILL NEED LAB WORK (BMET); PLEASE HAVE RESULTS FAXED TO [place], [place]  Testing/Procedures: NONE  Follow-Up: 12/07/16 @ 3:15 WITH SCOTT WEAVER, PAC   Any Other Special Instructions Will Be Listed Below (If Applicable).  If you need a refill on your cardiac medications before your next appointment, please call your pharmacy.

## 2016-11-11 LAB — BASIC METABOLIC PANEL
BUN: 40 mg/dL — AB (ref 4–21)
Creatinine: 1.8 mg/dL — AB (ref 0.6–1.3)
Glucose: 167 mg/dL
Potassium: 4.3 mmol/L (ref 3.4–5.3)
SODIUM: 141 mmol/L (ref 137–147)

## 2016-11-12 ENCOUNTER — Encounter: Payer: Self-pay | Admitting: *Deleted

## 2016-11-17 ENCOUNTER — Non-Acute Institutional Stay: Payer: Medicare Other | Admitting: Internal Medicine

## 2016-11-17 ENCOUNTER — Encounter: Payer: Self-pay | Admitting: Internal Medicine

## 2016-11-17 DIAGNOSIS — L97521 Non-pressure chronic ulcer of other part of left foot limited to breakdown of skin: Secondary | ICD-10-CM | POA: Diagnosis not present

## 2016-11-17 DIAGNOSIS — M86172 Other acute osteomyelitis, left ankle and foot: Secondary | ICD-10-CM | POA: Diagnosis not present

## 2016-11-17 DIAGNOSIS — R938 Abnormal findings on diagnostic imaging of other specified body structures: Secondary | ICD-10-CM

## 2016-11-17 DIAGNOSIS — I25810 Atherosclerosis of coronary artery bypass graft(s) without angina pectoris: Secondary | ICD-10-CM | POA: Diagnosis not present

## 2016-11-17 DIAGNOSIS — I5032 Chronic diastolic (congestive) heart failure: Secondary | ICD-10-CM | POA: Diagnosis not present

## 2016-11-17 DIAGNOSIS — R9389 Abnormal findings on diagnostic imaging of other specified body structures: Secondary | ICD-10-CM

## 2016-11-17 DIAGNOSIS — E11621 Type 2 diabetes mellitus with foot ulcer: Secondary | ICD-10-CM | POA: Diagnosis not present

## 2016-11-17 DIAGNOSIS — L97422 Non-pressure chronic ulcer of left heel and midfoot with fat layer exposed: Secondary | ICD-10-CM | POA: Diagnosis not present

## 2016-11-17 NOTE — Assessment & Plan Note (Signed)
See 11/17/16 chest pain resolved with nitroglycerin

## 2016-11-17 NOTE — Assessment & Plan Note (Signed)
Risk of sodium intake exacerbating heart failure discussed

## 2016-11-17 NOTE — Patient Instructions (Signed)
Please  blowup at least 5  balloons a day to enhance inflation of the lungs and prevent atelectasis as we discussed.

## 2016-11-17 NOTE — Progress Notes (Signed)
This is a nursing facility follow up for specific acute issues of chest pain and abnormal chest x-ray.  Interim medical record and care since last [place] visit was updated with review of diagnostic studies and change in clinical status since last visit were documented.  HPI: On 11/13/16 nitroglycerin every 5  minutes 3 doses and chest x-ray were ordered for chest pain. Multiple imaging suggested mild patchy left basilar density compatible with pneumonia or atelectasis ,new compared to prior x-ray .Also "mild prominence of heart size" was reported. Avelox 400 mg daily for 5 days was ordered along with Florastor. Also he was to receive DuoNeb nebs 3 times a day for 5 days. The films were personally reviewed. There is increased interstitial markings diffusely especially in the left lower lobe. The left costophrenic angle is slightly indistinct. A patchy pneumonitis cannot be ruled out. He can give me today's date but he does not remember when or how many nitroglycerin he took. He said the chest pain did resolve. Has occasional intermittent diffuse pain across the chest. It does not radiate and is not associated with nausea or diaphoresis. He describes chronic swelling in the lower extremities which is basically unchanged. He has no paroxysmal nocturnal dyspnea. The edema is worse when he is standing for a period of time and better when supine. He's had some gray sputum but is unable to quantitate the amount. He does believe that the neb treatments have been of some benefit.  He maintains that he is not ingesting salt, but he has 3 separate containers assault able.  Review of systems: He is followed for LLE wound care at it [facility]. On 11/07/16 his Bactrim was discontinued. Constitutional: No fever,significant weight change, fatigue  Eyes: No redness, discharge, pain, vision change ENT/mouth: No nasal congestion,  purulent discharge, earache,change in hearing ,sore throat   Cardiovascular: No palpitations,paroxysmal nocturnal dyspnea, claudication, edema  Respiratory: No hemoptysis, DOE  Dermatologic: No rash, pruritus, change in appearance of skin Hematologic/lymphatic: No significant bruising, lymphadenopathy,abnormal bleeding Allergy/immunology: No itchy/ watery eyes, significant sneezing, urticaria, angioedema  Physical exam:  Pertinent or positive findings: Pattern alopecia is present. He is completely edentulous. He has symmetric crackles and rales at the bases.His abdomen is markedly protuberant. Left lower extremity is in a boot. Wound VAC is present. Pedal pulses are not palpable. He has scattered deformities and pitting of the fingernails. Osteoarthritic changes are present in the hands.  General appearance:Adequately nourished; no acute distress , increased work of breathing is present.   Lymphatic: No lymphadenopathy about the head, neck, axilla . Eyes: No conjunctival inflammation or lid edema is present. There is no scleral icterus. Ears:  External ear exam shows no significant lesions or deformities. TMs normal  Nose:  External nasal examination shows no deformity or inflammation. Nasal mucosa are dry without lesions ,exudates Oral exam: lips and gums are healthy appearing.There is no oropharyngeal erythema or exudate . Neck:  No thyromegaly, masses, tenderness noted.    Heart:  Normal rate and regular rhythm. S1 and S2 normal without gallop, murmur, click, rub .  Abdomen:Bowel sounds are normal. Abdomen is soft and nontender with no organomegaly, hernias,masses. GU: deferred  Extremities:  No cyanosis, clubbing  Skin: Warm & dry w/o tenting. No significant lesions or rash.   #1 chest pain, resolved  #2 patchy pneumonia versus atelectasis left lower lobe. Clinically it would appear to be atelectasis. I discussed modified incentive spirometry with him.  #3 cardiomegaly. I discussed  the risk of exacerbating heart failure with sodium  intake

## 2016-11-20 DIAGNOSIS — E11621 Type 2 diabetes mellitus with foot ulcer: Secondary | ICD-10-CM | POA: Diagnosis not present

## 2016-11-20 DIAGNOSIS — I10 Essential (primary) hypertension: Secondary | ICD-10-CM | POA: Diagnosis not present

## 2016-11-20 DIAGNOSIS — Z951 Presence of aortocoronary bypass graft: Secondary | ICD-10-CM | POA: Diagnosis not present

## 2016-11-20 DIAGNOSIS — I739 Peripheral vascular disease, unspecified: Secondary | ICD-10-CM | POA: Diagnosis not present

## 2016-11-20 DIAGNOSIS — B9562 Methicillin resistant Staphylococcus aureus infection as the cause of diseases classified elsewhere: Secondary | ICD-10-CM | POA: Diagnosis not present

## 2016-11-20 DIAGNOSIS — I2510 Atherosclerotic heart disease of native coronary artery without angina pectoris: Secondary | ICD-10-CM | POA: Diagnosis not present

## 2016-11-20 DIAGNOSIS — E785 Hyperlipidemia, unspecified: Secondary | ICD-10-CM | POA: Diagnosis not present

## 2016-11-20 DIAGNOSIS — Z7982 Long term (current) use of aspirin: Secondary | ICD-10-CM | POA: Diagnosis not present

## 2016-11-20 DIAGNOSIS — E1151 Type 2 diabetes mellitus with diabetic peripheral angiopathy without gangrene: Secondary | ICD-10-CM | POA: Diagnosis not present

## 2016-11-20 DIAGNOSIS — L97528 Non-pressure chronic ulcer of other part of left foot with other specified severity: Secondary | ICD-10-CM | POA: Diagnosis not present

## 2016-11-20 DIAGNOSIS — T814XXA Infection following a procedure, initial encounter: Secondary | ICD-10-CM | POA: Diagnosis not present

## 2016-11-20 DIAGNOSIS — M869 Osteomyelitis, unspecified: Secondary | ICD-10-CM | POA: Diagnosis not present

## 2016-11-20 DIAGNOSIS — I25810 Atherosclerosis of coronary artery bypass graft(s) without angina pectoris: Secondary | ICD-10-CM | POA: Diagnosis not present

## 2016-11-20 DIAGNOSIS — E1169 Type 2 diabetes mellitus with other specified complication: Secondary | ICD-10-CM | POA: Diagnosis not present

## 2016-11-24 DIAGNOSIS — I739 Peripheral vascular disease, unspecified: Secondary | ICD-10-CM | POA: Diagnosis not present

## 2016-11-24 DIAGNOSIS — I2510 Atherosclerotic heart disease of native coronary artery without angina pectoris: Secondary | ICD-10-CM | POA: Diagnosis not present

## 2016-11-24 DIAGNOSIS — E1169 Type 2 diabetes mellitus with other specified complication: Secondary | ICD-10-CM | POA: Diagnosis not present

## 2016-11-24 DIAGNOSIS — I10 Essential (primary) hypertension: Secondary | ICD-10-CM | POA: Diagnosis not present

## 2016-11-24 DIAGNOSIS — L97422 Non-pressure chronic ulcer of left heel and midfoot with fat layer exposed: Secondary | ICD-10-CM | POA: Diagnosis not present

## 2016-11-24 DIAGNOSIS — E11621 Type 2 diabetes mellitus with foot ulcer: Secondary | ICD-10-CM | POA: Diagnosis not present

## 2016-11-24 DIAGNOSIS — M86172 Other acute osteomyelitis, left ankle and foot: Secondary | ICD-10-CM | POA: Diagnosis not present

## 2016-11-24 DIAGNOSIS — L97509 Non-pressure chronic ulcer of other part of unspecified foot with unspecified severity: Secondary | ICD-10-CM | POA: Diagnosis not present

## 2016-12-03 DIAGNOSIS — I10 Essential (primary) hypertension: Secondary | ICD-10-CM | POA: Diagnosis not present

## 2016-12-03 DIAGNOSIS — E1169 Type 2 diabetes mellitus with other specified complication: Secondary | ICD-10-CM | POA: Diagnosis not present

## 2016-12-03 DIAGNOSIS — I739 Peripheral vascular disease, unspecified: Secondary | ICD-10-CM | POA: Diagnosis not present

## 2016-12-03 DIAGNOSIS — E11621 Type 2 diabetes mellitus with foot ulcer: Secondary | ICD-10-CM | POA: Diagnosis not present

## 2016-12-03 DIAGNOSIS — I2510 Atherosclerotic heart disease of native coronary artery without angina pectoris: Secondary | ICD-10-CM | POA: Diagnosis not present

## 2016-12-03 DIAGNOSIS — L97522 Non-pressure chronic ulcer of other part of left foot with fat layer exposed: Secondary | ICD-10-CM | POA: Diagnosis not present

## 2016-12-03 DIAGNOSIS — M869 Osteomyelitis, unspecified: Secondary | ICD-10-CM | POA: Diagnosis not present

## 2016-12-03 DIAGNOSIS — M86172 Other acute osteomyelitis, left ankle and foot: Secondary | ICD-10-CM | POA: Diagnosis not present

## 2016-12-04 ENCOUNTER — Non-Acute Institutional Stay (SKILLED_NURSING_FACILITY): Payer: Medicare HMO | Admitting: Nurse Practitioner

## 2016-12-04 DIAGNOSIS — R112 Nausea with vomiting, unspecified: Secondary | ICD-10-CM | POA: Diagnosis not present

## 2016-12-04 DIAGNOSIS — I25810 Atherosclerosis of coronary artery bypass graft(s) without angina pectoris: Secondary | ICD-10-CM

## 2016-12-04 DIAGNOSIS — I5032 Chronic diastolic (congestive) heart failure: Secondary | ICD-10-CM | POA: Diagnosis not present

## 2016-12-04 DIAGNOSIS — R197 Diarrhea, unspecified: Secondary | ICD-10-CM | POA: Diagnosis not present

## 2016-12-04 DIAGNOSIS — R079 Chest pain, unspecified: Secondary | ICD-10-CM | POA: Diagnosis not present

## 2016-12-04 DIAGNOSIS — E0821 Diabetes mellitus due to underlying condition with diabetic nephropathy: Secondary | ICD-10-CM

## 2016-12-04 DIAGNOSIS — I509 Heart failure, unspecified: Secondary | ICD-10-CM | POA: Diagnosis not present

## 2016-12-04 LAB — CBC AND DIFFERENTIAL
HCT: 29 % — AB (ref 41–53)
Hemoglobin: 9.4 g/dL — AB (ref 13.5–17.5)
Platelets: 102 10*3/uL — AB (ref 150–399)
WBC: 6.8 10*3/mL

## 2016-12-04 LAB — BASIC METABOLIC PANEL
BUN: 48 mg/dL — AB (ref 4–21)
Creatinine: 1.7 mg/dL — AB (ref 0.6–1.3)
Glucose: 164 mg/dL
POTASSIUM: 4.7 mmol/L (ref 3.4–5.3)
Sodium: 141 mmol/L (ref 137–147)

## 2016-12-04 LAB — HEPATIC FUNCTION PANEL
ALT: 28 U/L (ref 10–40)
AST: 23 U/L (ref 14–40)
Alkaline Phosphatase: 44 U/L (ref 25–125)
Bilirubin, Total: 0.4 mg/dL
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
## 2016-12-07 ENCOUNTER — Ambulatory Visit: Payer: Medicare HMO | Admitting: Physician Assistant

## 2016-12-07 DIAGNOSIS — I4819 Other persistent atrial fibrillation: Secondary | ICD-10-CM | POA: Insufficient documentation
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
## 2016-12-10 DIAGNOSIS — E11621 Type 2 diabetes mellitus with foot ulcer: Secondary | ICD-10-CM | POA: Diagnosis not present

## 2016-12-10 DIAGNOSIS — I10 Essential (primary) hypertension: Secondary | ICD-10-CM | POA: Diagnosis not present

## 2016-12-10 DIAGNOSIS — L97529 Non-pressure chronic ulcer of other part of left foot with unspecified severity: Secondary | ICD-10-CM | POA: Diagnosis not present

## 2016-12-10 DIAGNOSIS — L089 Local infection of the skin and subcutaneous tissue, unspecified: Secondary | ICD-10-CM | POA: Diagnosis not present

## 2016-12-10 DIAGNOSIS — L97809 Non-pressure chronic ulcer of other part of unspecified lower leg with unspecified severity: Secondary | ICD-10-CM | POA: Diagnosis not present

## 2016-12-10 DIAGNOSIS — Z792 Long term (current) use of antibiotics: Secondary | ICD-10-CM | POA: Diagnosis not present

## 2016-12-10 DIAGNOSIS — T148XXA Other injury of unspecified body region, initial encounter: Secondary | ICD-10-CM | POA: Diagnosis not present

## 2016-12-10 DIAGNOSIS — X58XXXA Exposure to other specified factors, initial encounter: Secondary | ICD-10-CM | POA: Diagnosis not present

## 2016-12-10 DIAGNOSIS — I2510 Atherosclerotic heart disease of native coronary artery without angina pectoris: Secondary | ICD-10-CM | POA: Diagnosis not present

## 2016-12-10 DIAGNOSIS — Z0000 Encounter for general adult medical examination without abnormal findings: Secondary | ICD-10-CM | POA: Diagnosis not present

## 2016-12-10 DIAGNOSIS — Z5189 Encounter for other specified aftercare: Secondary | ICD-10-CM | POA: Diagnosis not present

## 2016-12-10 DIAGNOSIS — L97522 Non-pressure chronic ulcer of other part of left foot with fat layer exposed: Secondary | ICD-10-CM | POA: Diagnosis not present

## 2016-12-10 DIAGNOSIS — E785 Hyperlipidemia, unspecified: Secondary | ICD-10-CM | POA: Diagnosis not present

## 2016-12-10 DIAGNOSIS — E1169 Type 2 diabetes mellitus with other specified complication: Secondary | ICD-10-CM | POA: Diagnosis not present

## 2016-12-10 DIAGNOSIS — Z951 Presence of aortocoronary bypass graft: Secondary | ICD-10-CM | POA: Diagnosis not present

## 2016-12-10 DIAGNOSIS — I739 Peripheral vascular disease, unspecified: Secondary | ICD-10-CM | POA: Diagnosis not present

## 2016-12-10 DIAGNOSIS — I25810 Atherosclerosis of coronary artery bypass graft(s) without angina pectoris: Secondary | ICD-10-CM | POA: Diagnosis not present

## 2016-12-11 ENCOUNTER — Encounter: Payer: Self-pay | Admitting: Internal Medicine

## 2016-12-11 DIAGNOSIS — M869 Osteomyelitis, unspecified: Secondary | ICD-10-CM | POA: Insufficient documentation

## 2016-12-16 DIAGNOSIS — I2510 Atherosclerotic heart disease of native coronary artery without angina pectoris: Secondary | ICD-10-CM | POA: Diagnosis not present

## 2016-12-16 DIAGNOSIS — I739 Peripheral vascular disease, unspecified: Secondary | ICD-10-CM | POA: Diagnosis not present

## 2016-12-16 DIAGNOSIS — E11621 Type 2 diabetes mellitus with foot ulcer: Secondary | ICD-10-CM | POA: Diagnosis not present

## 2016-12-16 DIAGNOSIS — M869 Osteomyelitis, unspecified: Secondary | ICD-10-CM | POA: Diagnosis not present

## 2016-12-16 DIAGNOSIS — L97509 Non-pressure chronic ulcer of other part of unspecified foot with unspecified severity: Secondary | ICD-10-CM | POA: Diagnosis not present

## 2016-12-16 DIAGNOSIS — I10 Essential (primary) hypertension: Secondary | ICD-10-CM | POA: Diagnosis not present

## 2016-12-16 DIAGNOSIS — E1169 Type 2 diabetes mellitus with other specified complication: Secondary | ICD-10-CM | POA: Diagnosis not present

## 2016-12-18 DIAGNOSIS — D649 Anemia, unspecified: Secondary | ICD-10-CM | POA: Diagnosis not present

## 2016-12-18 LAB — CBC AND DIFFERENTIAL
HCT: 23 % — AB (ref 41–53)
HEMOGLOBIN: 7.3 g/dL — AB (ref 13.5–17.5)
PLATELETS: 143 10*3/uL — AB (ref 150–399)
WBC: 10.4 10^3/mL

## 2016-12-18 LAB — BASIC METABOLIC PANEL
BUN: 45 mg/dL — AB (ref 4–21)
Creatinine: 1.7 mg/dL — AB (ref 0.6–1.3)

## 2016-12-18 LAB — POCT ERYTHROCYTE SEDIMENTATION RATE, NON-AUTOMATED: SED RATE: 62 mm

## 2016-12-21 DIAGNOSIS — I509 Heart failure, unspecified: Secondary | ICD-10-CM | POA: Diagnosis not present

## 2016-12-21 DIAGNOSIS — D649 Anemia, unspecified: Secondary | ICD-10-CM | POA: Diagnosis not present

## 2016-12-21 DIAGNOSIS — I10 Essential (primary) hypertension: Secondary | ICD-10-CM | POA: Diagnosis not present

## 2016-12-21 DIAGNOSIS — E119 Type 2 diabetes mellitus without complications: Secondary | ICD-10-CM | POA: Diagnosis not present

## 2016-12-21 LAB — FECAL OCCULT BLOOD, GUAIAC: Fecal Occult Blood: POSITIVE

## 2016-12-21 LAB — CBC AND DIFFERENTIAL
HCT: 20 % — AB (ref 41–53)
Hemoglobin: 6.9 g/dL — AB (ref 13.5–17.5)
Platelets: 154 10*3/uL (ref 150–399)
WBC: 11.6 10*3/mL

## 2016-12-22 ENCOUNTER — Non-Acute Institutional Stay: Payer: Medicare Other | Admitting: Internal Medicine

## 2016-12-22 ENCOUNTER — Encounter: Payer: Self-pay | Admitting: Internal Medicine

## 2016-12-22 DIAGNOSIS — M86472 Chronic osteomyelitis with draining sinus, left ankle and foot: Secondary | ICD-10-CM

## 2016-12-22 DIAGNOSIS — I481 Persistent atrial fibrillation: Secondary | ICD-10-CM

## 2016-12-22 DIAGNOSIS — E1151 Type 2 diabetes mellitus with diabetic peripheral angiopathy without gangrene: Secondary | ICD-10-CM | POA: Diagnosis not present

## 2016-12-22 DIAGNOSIS — D649 Anemia, unspecified: Secondary | ICD-10-CM | POA: Diagnosis not present

## 2016-12-22 DIAGNOSIS — I10 Essential (primary) hypertension: Secondary | ICD-10-CM | POA: Diagnosis not present

## 2016-12-22 DIAGNOSIS — E119 Type 2 diabetes mellitus without complications: Secondary | ICD-10-CM | POA: Diagnosis not present

## 2016-12-22 DIAGNOSIS — I4819 Other persistent atrial fibrillation: Secondary | ICD-10-CM

## 2016-12-22 DIAGNOSIS — I509 Heart failure, unspecified: Secondary | ICD-10-CM | POA: Diagnosis not present

## 2016-12-22 DIAGNOSIS — I70209 Unspecified atherosclerosis of native arteries of extremities, unspecified extremity: Secondary | ICD-10-CM

## 2016-12-22 LAB — CBC AND DIFFERENTIAL
HEMATOCRIT: 23 % — AB (ref 41–53)
HEMOGLOBIN: 7.7 g/dL — AB (ref 13.5–17.5)
Platelets: 186 10*3/uL (ref 150–399)
WBC: 10.6 10*3/mL

## 2016-12-22 NOTE — Assessment & Plan Note (Signed)
12/22/16 rate is adequately controlled, risk of continuing the Eliquis with a documented positive FOB and progressive anemia outweighs  thrombotic risk with a fib. He has no history of CVA, DVT, or PTE.

## 2016-12-22 NOTE — Assessment & Plan Note (Addendum)
White blood count is not elevated and he is not febrile. Sedimentation rate is 62. Labs will be faxed to Dr Zigmund Daniel

## 2016-12-22 NOTE — Assessment & Plan Note (Signed)
12/22/16 dietary interventions discussed with the patient especially in reference to food/liquids containing HFCS sugar

## 2016-12-22 NOTE — Assessment & Plan Note (Addendum)
12/22/16 aggressive normochromic, normocytic anemia in context of positive FOB and history of B12 deficiency and chronic renal insufficiency. No B12 levels present in Epic Monitor H&H ;check B12 level

## 2016-12-22 NOTE — Progress Notes (Signed)
Facility Location: [facility]  Room Number: 754  Code Status: Full Code   This is a nursing facility follow up for specific acute issue of progressive anemia. Interim medical record and care since last [place] visit was updated with review of diagnostic studies and change in clinical status since last visit were documented.  HPI: Serial CBCs have demonstrated progressive anemia. On 01/18/17 hemoglobin was 6.9. He is on low-dose Eliquis for atrial fibrillation. He is on no aspirin or other nonsteroidal anti-inflammatory agents. He denies any bleeding dyscrasias except for self-limited epistaxis 2/4, in context of intranasal steroids He states he "doesn't feel right". He also describes exertional dyspnea. He denies any history of stroke or pulmonary thromboemboli. He has had "7 bypasses". The chart lists diverticulosis, but he denies any memory of EGD or colonoscopy. He is being treated at [facility] for osteomyelitis of left foot.Dr Zigmund Daniel Rxed doxycycline 100 milligram twice a day starting 2/3 to be continued for one month. Because of the risk of doxycycline affecting the gastroesophageal sphincter pressure in exacerbating reflux, PPI was initiated twice a day.  Review of systems:  Constitutional: No fever,significant weight change  Eyes: No redness, discharge, pain, vision change ENT/mouth: No nasal congestion,  purulent discharge, earache,change in hearing ,sore throat  Cardiovascular: No chest pain, palpitations,paroxysmal nocturnal dyspnea, claudication, edema  Respiratory: No cough, sputum production,hemoptysis, significant snoring,apnea   Gastrointestinal: No heartburn,dysphagia,abdominal pain, nausea / vomiting,rectal bleeding, melena,change in bowels Genitourinary: No dysuria,hematuria, pyuria,  incontinence, nocturia Dermatologic: No rash, pruritus, change in appearance of skin Neurologic: No dizziness,headache,syncope, seizures,  numbness , tingling Psychiatric: No significant anxiety , depression, insomnia, anorexia Endocrine: No change in hair/skin/ nails, excessive thirst, excessive hunger, excessive urination  Hematologic/lymphatic: No significant bruising, lymphadenopathy,abnormal bleeding Allergy/immunology: No itchy/ watery eyes, significant sneezing, urticaria, angioedema  Physical exam:  Pertinent or positive findings: pattern alopecia is present. He has arcus senilis. Conjunctiva are pale.He is edentulous. There is dried blood over the right nasal septum;nares are boggy. Heart sounds are distant and irregular. Rate is slow. Minor scattered rales. There is no increased work of breathing. Abdomen is protuberant. Bowel sounds are active. He has 1.5+ edema of the right lower extremity. Pedal pulses are decreased on the right. The left lower extremity is dressed. Hemovac is present @ LLE. DIP deformities are present. The nails of the left hand are markedly deformed and thickened.  He has a subcutaneous, movable lesion over the right index finger, possible lipoma vs venous anomaly.  Despite his history of chronic heart failure & diabetes, on the bedside table are cookies, sweet cola, salted peanut butter crackers, and multiple salt containers.   General appearance:Adequately nourished; no acute distress , increased work of breathing is present.   Lymphatic: No lymphadenopathy about the head, neck, axilla . Eyes: No conjunctival inflammation or lid edema is present. There is no scleral icterus. Ears:  External ear exam shows no significant lesions or deformities.   Nose:  External nasal examination shows no deformity or inflammation.  Oral exam: lips and gums are healthy appearing.There is no oropharyngeal erythema or exudate . Neck:  No thyromegaly, masses, tenderness noted.    Heart:  No gallop, murmur, click, rub .  Abdomen: Abdomen is soft and nontender with no organomegaly, hernias,masses. GU: deferred    Extremities:  No cyanosis Neurologic exam : Strength equal  in upper & lower extremities Balance,Rhomberg,finger to nose testing could not be completed due to clinical state Skin: Warm &  dry w/o tenting. No significant rash.  See summary under each active problem in the Problem List with associated updated therapeutic plan

## 2016-12-22 NOTE — Patient Instructions (Signed)
See assessment and plan under each diagnosis in the problem list and acutely for this visit 

## 2016-12-23 DIAGNOSIS — I10 Essential (primary) hypertension: Secondary | ICD-10-CM | POA: Diagnosis not present

## 2016-12-23 DIAGNOSIS — I509 Heart failure, unspecified: Secondary | ICD-10-CM | POA: Diagnosis not present

## 2016-12-23 DIAGNOSIS — E119 Type 2 diabetes mellitus without complications: Secondary | ICD-10-CM | POA: Diagnosis not present

## 2016-12-23 DIAGNOSIS — D649 Anemia, unspecified: Secondary | ICD-10-CM | POA: Diagnosis not present

## 2016-12-23 LAB — CBC AND DIFFERENTIAL
HCT: 23 % — AB (ref 41–53)
HCT: 23 % — AB (ref 41–53)
HEMOGLOBIN: 7.4 g/dL — AB (ref 13.5–17.5)

## 2016-12-23 LAB — VITAMIN B12: VITAMIN B 12: 1868

## 2016-12-23 LAB — HEMOGLOBIN A1C: HEMOGLOBIN A1C: 7.4

## 2016-12-24 ENCOUNTER — Encounter: Payer: Self-pay | Admitting: *Deleted

## 2016-12-24 DIAGNOSIS — E11621 Type 2 diabetes mellitus with foot ulcer: Secondary | ICD-10-CM | POA: Diagnosis not present

## 2016-12-24 DIAGNOSIS — I2510 Atherosclerotic heart disease of native coronary artery without angina pectoris: Secondary | ICD-10-CM | POA: Diagnosis not present

## 2016-12-24 DIAGNOSIS — S91101D Unspecified open wound of right great toe without damage to nail, subsequent encounter: Secondary | ICD-10-CM | POA: Diagnosis not present

## 2016-12-24 DIAGNOSIS — I872 Venous insufficiency (chronic) (peripheral): Secondary | ICD-10-CM | POA: Diagnosis not present

## 2016-12-24 DIAGNOSIS — E1151 Type 2 diabetes mellitus with diabetic peripheral angiopathy without gangrene: Secondary | ICD-10-CM | POA: Diagnosis not present

## 2016-12-24 DIAGNOSIS — L97422 Non-pressure chronic ulcer of left heel and midfoot with fat layer exposed: Secondary | ICD-10-CM | POA: Diagnosis not present

## 2016-12-24 DIAGNOSIS — I10 Essential (primary) hypertension: Secondary | ICD-10-CM | POA: Diagnosis not present

## 2016-12-24 DIAGNOSIS — X58XXXD Exposure to other specified factors, subsequent encounter: Secondary | ICD-10-CM | POA: Diagnosis not present

## 2016-12-28 LAB — BASIC METABOLIC PANEL
BUN: 52 — AB (ref 4–21)
Creatinine: 2.2 — AB (ref 0.6–1.3)
GLUCOSE: 162
Potassium: 4.5 (ref 3.4–5.3)
Sodium: 140 (ref 137–147)

## 2016-12-31 DIAGNOSIS — I739 Peripheral vascular disease, unspecified: Secondary | ICD-10-CM | POA: Diagnosis not present

## 2016-12-31 DIAGNOSIS — M868X7 Other osteomyelitis, ankle and foot: Secondary | ICD-10-CM | POA: Diagnosis not present

## 2016-12-31 DIAGNOSIS — E1151 Type 2 diabetes mellitus with diabetic peripheral angiopathy without gangrene: Secondary | ICD-10-CM | POA: Diagnosis not present

## 2016-12-31 DIAGNOSIS — L97521 Non-pressure chronic ulcer of other part of left foot limited to breakdown of skin: Secondary | ICD-10-CM | POA: Diagnosis not present

## 2016-12-31 DIAGNOSIS — I10 Essential (primary) hypertension: Secondary | ICD-10-CM | POA: Diagnosis not present

## 2016-12-31 DIAGNOSIS — I2510 Atherosclerotic heart disease of native coronary artery without angina pectoris: Secondary | ICD-10-CM | POA: Diagnosis not present

## 2016-12-31 DIAGNOSIS — E11621 Type 2 diabetes mellitus with foot ulcer: Secondary | ICD-10-CM | POA: Diagnosis not present

## 2017-01-05 ENCOUNTER — Non-Acute Institutional Stay (SKILLED_NURSING_FACILITY): Payer: Medicare HMO | Admitting: Nurse Practitioner

## 2017-01-05 DIAGNOSIS — I70209 Unspecified atherosclerosis of native arteries of extremities, unspecified extremity: Secondary | ICD-10-CM | POA: Diagnosis not present

## 2017-01-05 DIAGNOSIS — M86472 Chronic osteomyelitis with draining sinus, left ankle and foot: Secondary | ICD-10-CM | POA: Diagnosis not present

## 2017-01-05 DIAGNOSIS — D649 Anemia, unspecified: Secondary | ICD-10-CM

## 2017-01-05 DIAGNOSIS — E1151 Type 2 diabetes mellitus with diabetic peripheral angiopathy without gangrene: Secondary | ICD-10-CM

## 2017-01-05 DIAGNOSIS — I481 Persistent atrial fibrillation: Secondary | ICD-10-CM

## 2017-01-05 DIAGNOSIS — I4819 Other persistent atrial fibrillation: Secondary | ICD-10-CM

## 2017-01-05 DIAGNOSIS — I5032 Chronic diastolic (congestive) heart failure: Secondary | ICD-10-CM | POA: Diagnosis not present

## 2017-01-05 NOTE — Progress Notes (Signed)
Patient ID: Cole [name], male   DOB: July 23, 1920, 83 y.o.   MRN: [id number]    Nursing Home Location:  [facility]  Place of Service: SNF (31)  PCP: Henrine Screws, MD  No Known Allergies  Chief Complaint  Patient presents with  . Medical Management of Chronic Issues    Routine Visit    HPI:  Patient is a 83 y.o. male seen today at [facility] for routine visit and weight gain . Pt with hx of htn, dm, CAD, CHF, bph.   Pt currently being followed by wound care at [facility] due to nonhealing burn to left foot. Currently on minocycline until 2/25 for osteomyelitis.  Pt with severe anemia and was on eliquis due to afib, pt was also noted to be hem positive therefore eliquis has been stopped at this time. Cont on iron.  Blood sugars ranging between 102-225 Pt with increased LE edema, reports more easily fatigued and short of breath.  Denies adding salt to food yet still has it on his bedside table. Reports this is more about him keeping it there because he can.    Review of Systems:  Review of Systems  Constitutional: Negative for activity change, appetite change, fatigue and unexpected weight change.  HENT: Negative for congestion and hearing loss.   Eyes: Negative.   Respiratory: Positive for shortness of breath (with minimal activity). Negative for cough.   Cardiovascular: Positive for leg swelling. Negative for chest pain and palpitations.  Gastrointestinal: Negative for abdominal pain, constipation and diarrhea.  Genitourinary: Negative for difficulty urinating and dysuria.  Musculoskeletal: Negative for arthralgias and myalgias.  Skin: Positive for wound. Negative for color change.  Neurological: Negative for dizziness and weakness.  Psychiatric/Behavioral: Negative for agitation, behavioral problems and confusion.    Past Medical History:  Diagnosis Date  . Actinic keratitis   . CHF (congestive heart failure) ([place])   . Claudication ([place])   .  Coronary artery disease    status post CABG, 1999  . Diabetes mellitus   . Diverticulosis   . DJD (degenerative joint disease)   . Hypertension    hypertensive syndrome with dyspnea -[facility], February, 2011 - EF 50% and cardiac bypass grafts all patent - responded to diuretics and blood pressure control - [name]  . Microscopic hematuria   . Onychomycosis   . Prostate nodule   . PSVT (paroxysmal supraventricular tachycardia) ([place])   . Vitamin B12 deficiency   . Vitamin D deficiency    Past Surgical History:  Procedure Laterality Date  . APPENDECTOMY    . CORONARY ARTERY BYPASS GRAFT     Social History:   reports that he has quit smoking. His smoking use included Cigars. He has never used smokeless tobacco. He reports that he does not drink alcohol or use drugs.  Family History  Problem Relation Age of Onset  . Asthma Neg Hx   . COPD Neg Hx     Medications: Patient's Medications  New Prescriptions   No medications on file  Previous Medications   ACETAMINOPHEN (TYLENOL) 325 MG TABLET    Take 650 mg by mouth every 6 (six) hours as needed for mild pain.   AMINO ACIDS-PROTEIN HYDROLYS (FEEDING SUPPLEMENT, PRO-STAT SUGAR FREE 64,) LIQD    Take 30 mLs by mouth 2 (two) times daily with a meal.   AMLODIPINE (NORVASC) 5 MG TABLET    Take 5 mg by mouth daily.    BISACODYL (DULCOLAX) 10  MG SUPPOSITORY    Place 10 mg rectally as needed for moderate constipation.   CHOLECALCIFEROL (VITAMIN D) 1000 UNITS TABLET    Take 1,000 Units by mouth daily.   DOCUSATE SODIUM (COLACE) 100 MG CAPSULE    Take 100 mg by mouth daily.   FERROUS SULFATE 325 (65 FE) MG EC TABLET    Take 325 mg by mouth 2 (two) times daily after a meal.   FLUTICASONE (FLONASE) 50 MCG/ACT NASAL SPRAY    Place 1 spray into both nostrils 2 (two) times daily as needed.    FUROSEMIDE (LASIX) 80 MG TABLET    1 by mouth daily   GLIPIZIDE (GLUCOTROL) 5 MG TABLET    Take 2.5 mg by mouth daily before  breakfast.   HYDRALAZINE (APRESOLINE) 10 MG TABLET    Take 10 mg by mouth 2 (two) times daily.   ISOSORBIDE MONONITRATE (IMDUR) 30 MG 24 HR TABLET    Take 30 mg by mouth daily.   LATANOPROST (XALATAN) 0.005 % OPHTHALMIC SOLUTION    Place 1 drop into both eyes at bedtime.   LORATADINE (ALLERGY) 10 MG TABLET    Take 10 mg by mouth at bedtime. And as needed during the day   MAGNESIUM HYDROXIDE (MILK OF MAGNESIA) 400 MG/5ML SUSPENSION    Take 30 mLs by mouth daily as needed for mild constipation.   METOPROLOL (LOPRESSOR) 50 MG TABLET    TAKE 1 TABLET (50 MG TOTAL) BY MOUTH 2 (TWO) TIMES DAILY.   MINOCYCLINE (MINOCIN,DYNACIN) 100 MG CAPSULE    Take 100 mg by mouth 2 (two) times daily.   MULTIPLE VITAMIN (MULTIVITAMIN) TABLET    Take 1 tablet by mouth daily.   NITROGLYCERIN (NITROSTAT) 0.4 MG SL TABLET    Place 0.4 mg under the tongue every 5 (five) minutes as needed for chest pain. Up to 3 doses   PANTOPRAZOLE (PROTONIX) 40 MG TABLET    Take 40 mg by mouth 2 (two) times daily.   ROSUVASTATIN (CRESTOR) 5 MG TABLET    Take 1 tablet (5 mg total) by mouth 3 (three) times a week. Take 1 tablet Mon, Wed, Fri   SODIUM PHOSPHATES (RA SALINE ENEMA) 19-7 GM/118ML ENEM    Place 1 each rectally as needed (for constipation).   TAMSULOSIN (FLOMAX) 0.4 MG CAPS CAPSULE    Take 0.4 mg by mouth at bedtime.    VITAMIN B-12 (CYANOCOBALAMIN) 1000 MCG TABLET    Take 1,000 mcg by mouth daily.   VITAMIN C (ASCORBIC ACID) 500 MG TABLET    Take 500 mg by mouth 2 (two) times daily.   Modified Medications   No medications on file  Discontinued Medications   APIXABAN (ELIQUIS) 2.5 MG TABS TABLET    Take 1 tablet (2.5 mg total) by mouth 2 (two) times daily.   IPRATROPIUM-ALBUTEROL (DUONEB) 0.5-2.5 (3) MG/3ML SOLN    Take 3 mLs by nebulization 3 (three) times daily. End 11/20/16     Physical Exam: Vitals:   01/05/17 1421  BP: 140/66  Pulse: 80  Resp: 20  Temp: (!) 96.9 F (36.1 C)  SpO2: 98%  Weight: 189 lb (85.7 kg)    Height: 5\' 7"  (1.702 m)    Physical Exam  Constitutional: He is oriented to person, place, and time. He appears well-developed and well-nourished.  HENT:  Head: Normocephalic and atraumatic.  Mouth/Throat: Oropharynx is clear and moist.  Eyes: Pupils are equal, round, and reactive to light.  Neck: Neck supple. Carotid bruit is not  present.  Cardiovascular: Normal rate, regular rhythm and normal heart sounds.   Pulmonary/Chest: Effort normal.  diminished breath sounds  Abdominal: Soft. Bowel sounds are normal. He exhibits no distension, no abdominal bruit and no pulsatile midline mass. There is no tenderness. There is no rebound.  Musculoskeletal: He exhibits edema (2+). He exhibits no tenderness.  Neurological: He is alert and oriented to person, place, and time.  Skin: Skin is warm and dry.  Dressing to right foot, CDI, no erythema or increase in drainage   Psychiatric: He has a normal mood and affect.    Labs reviewed: Basic Metabolic Panel:  Recent Labs  10/20/16 0007 10/20/16 0046 10/20/16 0717 10/21/16 0316 10/26/16 11/11/16 12/04/16 12/18/16  NA 136 138 136 138 141 141 141  --   K 4.2 4.2 3.7 4.0 4.3 4.3 4.7  --   CL 103 101 99* 101  --   --   --   --   CO2 23  --  26 28  --   --   --   --   GLUCOSE 233* 240* 231* 132*  --   --   --   --   BUN 28* 36* 29* 30* 35* 40* 48* 45*  CREATININE 1.56* 1.60* 1.65* 1.73* 1.6* 1.8* 1.7* 1.7*  CALCIUM 9.1  --  8.8* 8.6*  --   --   --   --   MG 1.8  --   --  2.1  --   --   --   --    Liver Function Tests:  Recent Labs  10/20/16 0007 10/20/16 0717 12/04/16  AST 73* 41 23  ALT 39 33 28  ALKPHOS 77 64 44  BILITOT 1.0 1.3*  --   PROT 7.2 6.5  --   ALBUMIN 3.5 3.3*  --    No results for input(s): LIPASE, AMYLASE in the last 8760 hours. No results for input(s): AMMONIA in the last 8760 hours. CBC:  Recent Labs  10/05/16 0037 10/20/16 0007  10/20/16 0717 10/21/16 0316  12/04/16 12/18/16 12/21/16 12/23/16  WBC 19.6*  21.5*  --  13.9* 9.0  < > 6.8 10.4 11.6  --   NEUTROABS 16.7* 18.6*  --  11.4*  --   --   --   --   --   --   HGB 12.1* 11.1*  < > 9.9* 8.9*  < > 9.4* 7.3* 6.9*  --   HCT 36.6* 33.3*  < > 30.6* 27.1*  < > 29* 23* 20* 23*  MCV 88.2 87.9  --  87.4 87.7  --   --   --   --   --   PLT 214 158  --  181 173  < > 102* 143* 154  --   < > = values in this interval not displayed. TSH: No results for input(s): TSH in the last 8760 hours. A1C: Lab Results  Component Value Date   HGBA1C 7.4 12/23/2016    Lipid Panel:  Recent Labs  03/24/16  CHOL 137  HDL 47  LDLCALC 72  TRIG 89   BNP 08/06/16: 375.4  Assessment/Plan 1. Normochromic normocytic anemia Improved on recent labs, conts iron supplement. Will follow up CBC  2. Persistent atrial fibrillation (HCC) Rate controlled, Off eliquis due to hem positive stool with severe anemia.   3. Chronic osteomyelitis of left foot with draining sinus (HCC) conts to follow up with wound care, currently on minocycline until 01/10/17  4. 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 Will monitor  5. Chronic diastolic heart failure (HCC) Worsening heart failure at this time with edema, fatigue and DOE, will change lasix to demadex 40 mg daily with zaroxlyn 5 mg  X1. Will also start KCL 20 meq x 3 days and follow up BMP  Madysen Faircloth K. [name]  [facility] [phone] 8 am - 5 pm) [phone] (after hours)

## 2017-01-06 DIAGNOSIS — M6281 Muscle weakness (generalized): Secondary | ICD-10-CM | POA: Diagnosis not present

## 2017-01-06 LAB — BASIC METABOLIC PANEL
BUN: 28 mg/dL — AB (ref 4–21)
Creatinine: 1.4 mg/dL — AB (ref 0.6–1.3)
GLUCOSE: 130 mg/dL
POTASSIUM: 4.1 mmol/L (ref 3.4–5.3)
SODIUM: 139 mmol/L (ref 137–147)

## 2017-01-06 LAB — CBC AND DIFFERENTIAL
HCT: 32 % — AB (ref 41–53)
HEMOGLOBIN: 9.6 g/dL — AB (ref 13.5–17.5)
Platelets: 172 10*3/uL (ref 150–399)
WBC: 7.6 10^3/mL

## 2017-01-07 DIAGNOSIS — I2510 Atherosclerotic heart disease of native coronary artery without angina pectoris: Secondary | ICD-10-CM | POA: Diagnosis not present

## 2017-01-07 DIAGNOSIS — L97526 Non-pressure chronic ulcer of other part of left foot with bone involvement without evidence of necrosis: Secondary | ICD-10-CM | POA: Diagnosis not present

## 2017-01-07 DIAGNOSIS — L97511 Non-pressure chronic ulcer of other part of right foot limited to breakdown of skin: Secondary | ICD-10-CM | POA: Diagnosis not present

## 2017-01-07 DIAGNOSIS — Z4789 Encounter for other orthopedic aftercare: Secondary | ICD-10-CM | POA: Diagnosis not present

## 2017-01-07 DIAGNOSIS — L97529 Non-pressure chronic ulcer of other part of left foot with unspecified severity: Secondary | ICD-10-CM | POA: Diagnosis not present

## 2017-01-07 DIAGNOSIS — S91301D Unspecified open wound, right foot, subsequent encounter: Secondary | ICD-10-CM | POA: Diagnosis not present

## 2017-01-07 DIAGNOSIS — I10 Essential (primary) hypertension: Secondary | ICD-10-CM | POA: Diagnosis not present

## 2017-01-07 DIAGNOSIS — L97519 Non-pressure chronic ulcer of other part of right foot with unspecified severity: Secondary | ICD-10-CM | POA: Diagnosis not present

## 2017-01-07 DIAGNOSIS — E1151 Type 2 diabetes mellitus with diabetic peripheral angiopathy without gangrene: Secondary | ICD-10-CM | POA: Diagnosis not present

## 2017-01-07 DIAGNOSIS — L97522 Non-pressure chronic ulcer of other part of left foot with fat layer exposed: Secondary | ICD-10-CM | POA: Diagnosis not present

## 2017-01-07 DIAGNOSIS — L97422 Non-pressure chronic ulcer of left heel and midfoot with fat layer exposed: Secondary | ICD-10-CM | POA: Diagnosis not present

## 2017-01-07 DIAGNOSIS — E11621 Type 2 diabetes mellitus with foot ulcer: Secondary | ICD-10-CM | POA: Diagnosis not present

## 2017-01-13 DIAGNOSIS — I10 Essential (primary) hypertension: Secondary | ICD-10-CM | POA: Diagnosis not present

## 2017-01-14 DIAGNOSIS — L97526 Non-pressure chronic ulcer of other part of left foot with bone involvement without evidence of necrosis: Secondary | ICD-10-CM | POA: Diagnosis not present

## 2017-01-14 DIAGNOSIS — L97511 Non-pressure chronic ulcer of other part of right foot limited to breakdown of skin: Secondary | ICD-10-CM | POA: Diagnosis not present

## 2017-01-14 DIAGNOSIS — E11621 Type 2 diabetes mellitus with foot ulcer: Secondary | ICD-10-CM | POA: Diagnosis not present

## 2017-01-18 DIAGNOSIS — E119 Type 2 diabetes mellitus without complications: Secondary | ICD-10-CM | POA: Diagnosis not present

## 2017-01-18 DIAGNOSIS — D649 Anemia, unspecified: Secondary | ICD-10-CM | POA: Diagnosis not present

## 2017-01-18 DIAGNOSIS — I10 Essential (primary) hypertension: Secondary | ICD-10-CM | POA: Diagnosis not present

## 2017-01-18 DIAGNOSIS — I509 Heart failure, unspecified: Secondary | ICD-10-CM | POA: Diagnosis not present

## 2017-01-18 LAB — BASIC METABOLIC PANEL
BUN: 46 mg/dL — AB (ref 4–21)
Creatinine: 1.5 mg/dL — AB (ref 0.6–1.3)
Glucose: 239 mg/dL
POTASSIUM: 4.1 mmol/L (ref 3.4–5.3)
Sodium: 137 mmol/L (ref 137–147)

## 2017-01-20 ENCOUNTER — Non-Acute Institutional Stay (SKILLED_NURSING_FACILITY): Payer: Medicare HMO | Admitting: Nurse Practitioner

## 2017-01-20 ENCOUNTER — Encounter: Payer: Self-pay | Admitting: Nurse Practitioner

## 2017-01-20 DIAGNOSIS — I5042 Chronic combined systolic (congestive) and diastolic (congestive) heart failure: Secondary | ICD-10-CM | POA: Diagnosis not present

## 2017-01-20 NOTE — Progress Notes (Signed)
Patient ID: Cole [name], male   DOB: Sep 25, 1920, 83 y.o.   MRN: [id number]    Nursing Home Location:  [facility]  Place of Service: SNF (31)  PCP: Henrine Screws, MD  No Known Allergies  Chief Complaint  Patient presents with  . Follow-up    Resident is being seen for follow up on weight gain/CHF    HPI:  Patient is a 84 y.o. male seen today at [facility] for folow-up visit for mild CHF exacerbation, increased swelling, and noncompliance from 01/05/17 . Pt with hx of htn, dm, CAD, CHF, bph.pt was seen last week due to increased edema and weight gain. Lasix changed to Demadex however weight still cont to rise so zaroxlyn was added.  Pt reports breathing and swelling are much better than previous visit. He reports no increased SOB with exertion and decreased lower leg swelling today. Continues to keep salt at bedside table, however denies using this on his food. Pt discontent, no acute distress. Most recent weight is 184.2 which remains elevated.   Review of Systems:  Review of Systems  Constitutional: Negative for appetite change and fatigue.  HENT: Negative for congestion.   Respiratory: Negative for cough, chest tightness, shortness of breath and wheezing.   Cardiovascular: Negative for palpitations and leg swelling.  Genitourinary: Negative for difficulty urinating, dysuria and frequency.  Skin:       Ongoing treatment to right foot due to burn  Neurological: Negative for dizziness, weakness and light-headedness.    Past Medical History:  Diagnosis Date  . Actinic keratitis   . CHF (congestive heart failure) ([place])   . Claudication ([place])   . Coronary artery disease    status post CABG, 1999  . Diabetes mellitus   . Diverticulosis   . DJD (degenerative joint disease)   . Hypertension    hypertensive syndrome with dyspnea -[facility], February, 2011 - EF 50% and cardiac bypass grafts all patent - responded to diuretics and blood  pressure control - [name]  . Microscopic hematuria   . Onychomycosis   . Prostate nodule   . PSVT (paroxysmal supraventricular tachycardia) ([place])   . Vitamin B12 deficiency   . Vitamin D deficiency    Past Surgical History:  Procedure Laterality Date  . APPENDECTOMY    . CORONARY ARTERY BYPASS GRAFT     Social History:   reports that he has quit smoking. His smoking use included Cigars. He has never used smokeless tobacco. He reports that he does not drink alcohol or use drugs.  Family History  Problem Relation Age of Onset  . Asthma Neg Hx   . COPD Neg Hx     Medications: Patient's Medications  New Prescriptions   No medications on file  Previous Medications   ACETAMINOPHEN (TYLENOL) 325 MG TABLET    Take 650 mg by mouth every 6 (six) hours as needed for mild pain.   AMINO ACIDS-PROTEIN HYDROLYS (FEEDING SUPPLEMENT, PRO-STAT SUGAR FREE 64,) LIQD    Take 30 mLs by mouth 2 (two) times daily with a meal.   AMLODIPINE (NORVASC) 5 MG TABLET    Take 5 mg by mouth daily.    BISACODYL (DULCOLAX) 10 MG SUPPOSITORY    Place 10 mg rectally as needed for moderate constipation.   CHOLECALCIFEROL (VITAMIN D) 1000 UNITS TABLET    Take 1,000 Units by mouth daily.   DOCUSATE SODIUM (COLACE) 100 MG CAPSULE    Take 100 mg by mouth daily.  FERROUS SULFATE 325 (65 FE) MG EC TABLET    Take 325 mg by mouth 2 (two) times daily after a meal.   FLUTICASONE (FLONASE) 50 MCG/ACT NASAL SPRAY    Place 1 spray into both nostrils 2 (two) times daily as needed.    GLIPIZIDE (GLUCOTROL) 5 MG TABLET    Take 2.5 mg by mouth daily before breakfast.   HYDRALAZINE (APRESOLINE) 10 MG TABLET    Take 10 mg by mouth 2 (two) times daily.   ISOSORBIDE MONONITRATE (IMDUR) 30 MG 24 HR TABLET    Take 30 mg by mouth daily.   LATANOPROST (XALATAN) 0.005 % OPHTHALMIC SOLUTION    Place 1 drop into both eyes at bedtime.   MAGNESIUM HYDROXIDE (MILK OF MAGNESIA) 400 MG/5ML SUSPENSION    Take 30 mLs by mouth daily as  needed for mild constipation.   METOPROLOL (LOPRESSOR) 50 MG TABLET    TAKE 1 TABLET (50 MG TOTAL) BY MOUTH 2 (TWO) TIMES DAILY.   MULTIPLE VITAMIN (MULTIVITAMIN) TABLET    Take 1 tablet by mouth daily.   NITROGLYCERIN (NITROSTAT) 0.4 MG SL TABLET    Place 0.4 mg under the tongue every 5 (five) minutes as needed for chest pain. Up to 3 doses   PANTOPRAZOLE (PROTONIX) 40 MG TABLET    Take 40 mg by mouth 2 (two) times daily.   ROSUVASTATIN (CRESTOR) 5 MG TABLET    Take 1 tablet (5 mg total) by mouth 3 (three) times a week. Take 1 tablet Mon, Wed, Fri   SODIUM PHOSPHATES (RA SALINE ENEMA) 19-7 GM/118ML ENEM    Place 1 each rectally as needed (for constipation).   TAMSULOSIN (FLOMAX) 0.4 MG CAPS CAPSULE    Take 0.4 mg by mouth at bedtime.    VITAMIN B-12 (CYANOCOBALAMIN) 1000 MCG TABLET    Take 1,000 mcg by mouth daily.   VITAMIN C (ASCORBIC ACID) 500 MG TABLET    Take 500 mg by mouth 2 (two) times daily.   Modified Medications   No medications on file  Discontinued Medications   FUROSEMIDE (LASIX) 80 MG TABLET    1 by mouth daily   LORATADINE (ALLERGY) 10 MG TABLET    Take 10 mg by mouth at bedtime. And as needed during the day     Physical Exam: Vitals:   01/20/17 0909  BP: 113/60  Pulse: 60  Resp: 20  Temp: 98 F (36.7 C)  SpO2: 98%  Weight: 189 lb (85.7 kg)  Height: 5\' 7"  (1.702 m)    Physical Exam  Constitutional: He is oriented to person, place, and time. He appears well-developed and well-nourished. No distress.  HENT:  Head: Normocephalic and atraumatic.  Mouth/Throat: Oropharynx is clear and moist.  Eyes: Pupils are equal, round, and reactive to light.  Neck: Neck supple. No hepatojugular reflux and no JVD present. Carotid bruit is not present. Edema (1+ lower extremity edema in right leg. Pt has compression stocking in place) present.  Cardiovascular: Normal rate, regular rhythm, S1 normal, S2 normal, normal heart sounds and intact distal pulses.  Exam reveals decreased  pulses.   Pulses:      Popliteal pulses are 1+ on the right side.       Dorsalis pedis pulses are 1+ on the right side.  Pulmonary/Chest: Effort normal and breath sounds normal. No accessory muscle usage. No tachypnea. No respiratory distress.  Abdominal: Soft. Bowel sounds are normal. He exhibits no distension, no abdominal bruit and no pulsatile midline mass. There is  no tenderness. There is no rebound.  Musculoskeletal: He exhibits edema. He exhibits no tenderness.  1+ lower right leg edema.   Neurological: He is alert and oriented to person, place, and time.  Skin: Skin is warm and dry.  Dressing to right foot, CDI, no erythema or increase in drainage   Psychiatric: He has a normal mood and affect.    Labs reviewed: Basic Metabolic Panel:  Recent Labs  10/20/16 0007 10/20/16 0046 10/20/16 0717 10/21/16 0316  12/04/16 12/18/16 01/06/17 01/18/17  NA 136 138 136 138  < > 141  --  139 137  K 4.2 4.2 3.7 4.0  < > 4.7  --  4.1 4.1  CL 103 101 99* 101  --   --   --   --   --   CO2 23  --  26 28  --   --   --   --   --   GLUCOSE 233* 240* 231* 132*  --   --   --   --   --   BUN 28* 36* 29* 30*  < > 48* 45* 28* 46*  CREATININE 1.56* 1.60* 1.65* 1.73*  < > 1.7* 1.7* 1.4* 1.5*  CALCIUM 9.1  --  8.8* 8.6*  --   --   --   --   --   MG 1.8  --   --  2.1  --   --   --   --   --   < > = values in this interval not displayed. Liver Function Tests:  Recent Labs  10/20/16 0007 10/20/16 0717 12/04/16  AST 73* 41 23  ALT 39 33 28  ALKPHOS 77 64 44  BILITOT 1.0 1.3*  --   PROT 7.2 6.5  --   ALBUMIN 3.5 3.3*  --    No results for input(s): LIPASE, AMYLASE in the last 8760 hours. No results for input(s): AMMONIA in the last 8760 hours. CBC:  Recent Labs  10/05/16 0037 10/20/16 0007  10/20/16 0717 10/21/16 0316  12/21/16 12/22/16 12/23/16 01/06/17  WBC 19.6* 21.5*  --  13.9* 9.0  < > 11.6 10.6  --  7.6  NEUTROABS 16.7* 18.6*  --  11.4*  --   --   --   --   --   --   HGB 12.1*  11.1*  < > 9.9* 8.9*  < > 6.9* 7.7* 7.4* 9.6*  HCT 36.6* 33.3*  < > 30.6* 27.1*  < > 20* 23* 23*  23* 32*  MCV 88.2 87.9  --  87.4 87.7  --   --   --   --   --   PLT 214 158  --  181 173  < > 154 186  --  172  < > = values in this interval not displayed. TSH: No results for input(s): TSH in the last 8760 hours. A1C: Lab Results  Component Value Date   HGBA1C 7.4 12/23/2016    Lipid Panel:  Recent Labs  03/24/16  CHOL 137  HDL 47  LDLCALC 72  TRIG 89   BNP 08/06/16: 375.4  Assessment/Plan 1. Chronic combined systolic and diastolic CHF (congestive heart failure) (HCC) - Pt currently taking Demadex 20mg  PO Qd, Zaroxolyn 5 mg PO Qd. Swelling and SOB better.  -Weight cont to be up. 3/7-184.2, 3/2-179 -Will continue zaroxolyn x4 more days and increase demadex to 40 mg daily. -Will get BMP today -BMET 3/5 stable, BUN=46, Cr=1.5 as of  3/5. Previous results= 2/2 BUN=45, Cr=1.7 -K+=4.1  Ryin Ambrosius K. [name]  [facility] & [facility] [phone] 8 am - 5 pm) [phone] (after hours)

## 2017-01-21 DIAGNOSIS — I10 Essential (primary) hypertension: Secondary | ICD-10-CM | POA: Diagnosis not present

## 2017-01-21 DIAGNOSIS — L97422 Non-pressure chronic ulcer of left heel and midfoot with fat layer exposed: Secondary | ICD-10-CM | POA: Diagnosis not present

## 2017-01-21 DIAGNOSIS — E1169 Type 2 diabetes mellitus with other specified complication: Secondary | ICD-10-CM | POA: Diagnosis not present

## 2017-01-21 DIAGNOSIS — L97522 Non-pressure chronic ulcer of other part of left foot with fat layer exposed: Secondary | ICD-10-CM | POA: Diagnosis not present

## 2017-01-21 DIAGNOSIS — L97521 Non-pressure chronic ulcer of other part of left foot limited to breakdown of skin: Secondary | ICD-10-CM | POA: Diagnosis not present

## 2017-01-21 DIAGNOSIS — I2510 Atherosclerotic heart disease of native coronary artery without angina pectoris: Secondary | ICD-10-CM | POA: Diagnosis not present

## 2017-01-21 DIAGNOSIS — M869 Osteomyelitis, unspecified: Secondary | ICD-10-CM | POA: Diagnosis not present

## 2017-01-21 DIAGNOSIS — E1151 Type 2 diabetes mellitus with diabetic peripheral angiopathy without gangrene: Secondary | ICD-10-CM | POA: Diagnosis not present

## 2017-01-21 DIAGNOSIS — E11621 Type 2 diabetes mellitus with foot ulcer: Secondary | ICD-10-CM | POA: Diagnosis not present

## 2017-01-22 DIAGNOSIS — I10 Essential (primary) hypertension: Secondary | ICD-10-CM | POA: Diagnosis not present

## 2017-01-22 LAB — BASIC METABOLIC PANEL
BUN: 41 mg/dL — AB (ref 4–21)
Creatinine: 1.5 mg/dL — AB (ref 0.6–1.3)
Glucose: 174 mg/dL
Potassium: 4.2 mmol/L (ref 3.4–5.3)
SODIUM: 142 mmol/L (ref 137–147)

## 2017-01-28 DIAGNOSIS — L97511 Non-pressure chronic ulcer of other part of right foot limited to breakdown of skin: Secondary | ICD-10-CM | POA: Diagnosis not present

## 2017-01-28 DIAGNOSIS — L97522 Non-pressure chronic ulcer of other part of left foot with fat layer exposed: Secondary | ICD-10-CM | POA: Diagnosis not present

## 2017-01-28 DIAGNOSIS — I10 Essential (primary) hypertension: Secondary | ICD-10-CM | POA: Diagnosis not present

## 2017-01-28 DIAGNOSIS — I739 Peripheral vascular disease, unspecified: Secondary | ICD-10-CM | POA: Diagnosis not present

## 2017-01-28 DIAGNOSIS — E11621 Type 2 diabetes mellitus with foot ulcer: Secondary | ICD-10-CM | POA: Diagnosis not present

## 2017-02-04 DIAGNOSIS — E1169 Type 2 diabetes mellitus with other specified complication: Secondary | ICD-10-CM | POA: Diagnosis not present

## 2017-02-04 DIAGNOSIS — M869 Osteomyelitis, unspecified: Secondary | ICD-10-CM | POA: Diagnosis not present

## 2017-02-04 DIAGNOSIS — E11621 Type 2 diabetes mellitus with foot ulcer: Secondary | ICD-10-CM | POA: Diagnosis not present

## 2017-02-04 DIAGNOSIS — L97422 Non-pressure chronic ulcer of left heel and midfoot with fat layer exposed: Secondary | ICD-10-CM | POA: Diagnosis not present

## 2017-02-04 DIAGNOSIS — L97509 Non-pressure chronic ulcer of other part of unspecified foot with unspecified severity: Secondary | ICD-10-CM | POA: Diagnosis not present

## 2017-02-05 ENCOUNTER — Encounter: Payer: Self-pay | Admitting: Nurse Practitioner

## 2017-02-05 ENCOUNTER — Non-Acute Institutional Stay (SKILLED_NURSING_FACILITY): Payer: Medicare HMO | Admitting: Nurse Practitioner

## 2017-02-05 DIAGNOSIS — I4819 Other persistent atrial fibrillation: Secondary | ICD-10-CM

## 2017-02-05 DIAGNOSIS — E0821 Diabetes mellitus due to underlying condition with diabetic nephropathy: Secondary | ICD-10-CM

## 2017-02-05 DIAGNOSIS — M86472 Chronic osteomyelitis with draining sinus, left ankle and foot: Secondary | ICD-10-CM

## 2017-02-05 DIAGNOSIS — I10 Essential (primary) hypertension: Secondary | ICD-10-CM | POA: Diagnosis not present

## 2017-02-05 DIAGNOSIS — I481 Persistent atrial fibrillation: Secondary | ICD-10-CM | POA: Diagnosis not present

## 2017-02-05 DIAGNOSIS — I5042 Chronic combined systolic (congestive) and diastolic (congestive) heart failure: Secondary | ICD-10-CM
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
## 2017-02-09 ENCOUNTER — Non-Acute Institutional Stay (SKILLED_NURSING_FACILITY): Payer: Medicare HMO | Admitting: Internal Medicine

## 2017-02-09 ENCOUNTER — Encounter: Payer: Self-pay | Admitting: Internal Medicine

## 2017-02-09 DIAGNOSIS — I5042 Chronic combined systolic (congestive) and diastolic (congestive) heart failure: Secondary | ICD-10-CM

## 2017-02-09 DIAGNOSIS — Z91199 Patient's noncompliance with other medical treatment and regimen due to unspecified reason: Secondary | ICD-10-CM | POA: Insufficient documentation

## 2017-02-09 DIAGNOSIS — Z9119 Patient's noncompliance with other medical treatment and regimen: Secondary | ICD-10-CM | POA: Diagnosis not present

## 2017-02-09 LAB — BASIC METABOLIC PANEL
BUN: 43 — AB (ref 4–21)
Creatinine: 1.8 — AB (ref 0.6–1.3)
Glucose: 159
Potassium: 4.2 (ref 3.4–5.3)
Sodium: 138 (ref 137–147)

## 2017-02-09 NOTE — Patient Instructions (Signed)
See assessment and plan under each diagnosis acutely for this visit  

## 2017-02-09 NOTE — Progress Notes (Signed)
Facility Location: [facility]  Room Number: 326  Code Status: Full Code   This is a nursing facility follow up for specific acute issue of weight gain in the context of history of congestive heart failure.  Interim medical record and care since last [place] visit was updated with review of diagnostic studies and change in clinical status since last visit were documented.  HPI: From 3/21-3/27/18 the patient has gained from 175.8 pounds to 181.4. He denies any active cardio vascular symptoms He denies adding salt to his food, but he has several salt containers at the bedside and refuses to have them removed. He denies eating salted snacks such as nuts or crackers. He denies friends bringing in processed foods. Staff reports that he refuses his diuretic over the weekends and therefore will have elevated weights each Monday. When I showed him the 5.8 weight gain he began to discuss weight loss when he had diarrhea and his weight when he was admitted to the SNF. He has not seen Dr. Tamala Julian, his cardiologist for a prolonged period time. He states it's because he has difficulty getting to the office. That cardiologist's office is approximately 2  blocks down the street. He does travel to [facility] for his wound care ; his nephew or son will come from out of town and take him. He states it would be inconvenient for them to take him to the cardiologist's office.  Review of systems:  Constitutional: No fever, fatigue  Cardiovascular: No chest pain, palpitations,paroxysmal nocturnal dyspnea, claudication, edema  Respiratory: No cough, sputum production,hemoptysis, DOE , significant snoring,apnea  Gastrointestinal: No heartburn,dysphagia,abdominal pain, nausea / vomiting,rectal bleeding, melena,change in bowels Genitourinary: No dysuria,hematuria, pyuria,  incontinence, nocturia Musculoskeletal: No joint stiffness, joint swelling,  weakness,pain Dermatologic: No new rash, pruritus, change in appearance of skin Neurologic: No dizziness,headache,syncope, seizures, numbness , tingling Psychiatric: No significant anxiety , depression, insomnia, anorexia Endocrine: No change in hair/skin/ nails, excessive thirst, excessive hunger, excessive urination  Hematologic/lymphatic: No significant bruising, lymphadenopathy,abnormal bleeding Allergy/immunology: No itchy/ watery eyes, significant sneezing, urticaria, angioedema  Physical exam:  Pertinent or positive findings: He becomes very argumentative and defensive when sodium intake is discussed. Pattern alopecia is present. He is edentulous. He has minor rales at the bases. Breath sounds are distant. Heart sounds are markedly distant and irregular. Abdomen is protuberant. He has tense edema of the right lower extremity despite compression device. Pedal pulses are not palpable. He has extensive dressing of the left lower extremity with a wound VAC. He has osteoarthritic changes of the hands. Nails of the left hand are thickened and deformed.  General appearance:Adequately nourished; no acute distress , increased work of breathing is present.   Lymphatic: No lymphadenopathy about the head, neck, axilla . Eyes: No conjunctival inflammation or lid edema is present. There is no scleral icterus. Ears:  External ear exam shows no significant lesions or deformities.   Nose:  External nasal examination shows no deformity or inflammation. Nasal mucosa are pink and moist without lesions ,exudates Oral exam: lips and gums are healthy appearing.There is no oropharyngeal erythema or exudate . Neck:  No thyromegaly, masses, tenderness noted.    Heart:  No definite gallop, murmur, click, rub .  Abdomen:Bowel sounds are normal. Abdomen is soft and nontender with no organomegaly, hernias,masses. GU: deferred  Extremities:  No cyanosis, clubbing Skin: Warm & dry w/o tenting. No significant   rash.  See summary under each active problem in the Problem List  with associated updated therapeutic plan

## 2017-02-09 NOTE — Assessment & Plan Note (Addendum)
02/09/17 Metolazone  2.5 mg X 1 Risk of salt intake in reference to his congestive heart failure discussed Cardiology follow-up appointment

## 2017-02-09 NOTE — Assessment & Plan Note (Addendum)
Follow-up with Dr. Tamala Julian, cardiologist Verify with his cardiologist specific recommended sodium and fluid restrictions

## 2017-02-11 DIAGNOSIS — Z130 Encounter for screening for diseases of the blood and blood-forming organs and certain disorders involving the immune mechanism: Secondary | ICD-10-CM | POA: Diagnosis not present

## 2017-02-11 DIAGNOSIS — L97523 Non-pressure chronic ulcer of other part of left foot with necrosis of muscle: Secondary | ICD-10-CM | POA: Diagnosis not present

## 2017-02-11 DIAGNOSIS — E1169 Type 2 diabetes mellitus with other specified complication: Secondary | ICD-10-CM | POA: Diagnosis not present

## 2017-02-11 DIAGNOSIS — E11621 Type 2 diabetes mellitus with foot ulcer: Secondary | ICD-10-CM | POA: Diagnosis not present

## 2017-02-11 DIAGNOSIS — M869 Osteomyelitis, unspecified: Secondary | ICD-10-CM | POA: Diagnosis not present

## 2017-02-11 DIAGNOSIS — R6889 Other general symptoms and signs: Secondary | ICD-10-CM | POA: Diagnosis not present

## 2017-02-11 DIAGNOSIS — I10 Essential (primary) hypertension: Secondary | ICD-10-CM | POA: Diagnosis not present

## 2017-02-11 DIAGNOSIS — D649 Anemia, unspecified: Secondary | ICD-10-CM | POA: Diagnosis not present

## 2017-02-11 DIAGNOSIS — L97511 Non-pressure chronic ulcer of other part of right foot limited to breakdown of skin: Secondary | ICD-10-CM | POA: Diagnosis not present

## 2017-02-11 DIAGNOSIS — E039 Hypothyroidism, unspecified: Secondary | ICD-10-CM | POA: Diagnosis not present

## 2017-02-11 DIAGNOSIS — L97509 Non-pressure chronic ulcer of other part of unspecified foot with unspecified severity: Secondary | ICD-10-CM | POA: Diagnosis not present

## 2017-02-11 LAB — BASIC METABOLIC PANEL
BUN: 41 mg/dL — AB (ref 4–21)
CREATININE: 1.5 mg/dL — AB (ref 0.6–1.3)
Glucose: 174 mg/dL
Potassium: 3.9 mmol/L (ref 3.4–5.3)
Sodium: 138 mmol/L (ref 137–147)

## 2017-02-11 LAB — CBC AND DIFFERENTIAL
HCT: 37 % — AB (ref 41–53)
Hemoglobin: 12 g/dL — AB (ref 13.5–17.5)
Platelets: 161 10*3/uL (ref 150–399)
WBC: 10 10^3/mL

## 2017-02-11 LAB — TSH: TSH: 2.52 u[IU]/mL (ref 0.41–5.90)

## 2017-02-12 ENCOUNTER — Telehealth: Payer: Self-pay | Admitting: Interventional Cardiology

## 2017-02-12 ENCOUNTER — Encounter: Payer: Self-pay | Admitting: *Deleted

## 2017-02-12 NOTE — Telephone Encounter (Signed)
Faxed received from [facility].  Faxed over most recent weights.  Pt's weight on 02/05/17 was 175.8lbs.  Weight on 02/11/17 was 186.4lbs.  Will forward to Dr. Tamala Julian for review and advisement.  Fax states to see EPIC note from 02/09/17 with Dr. Linna Darner.  Per Dr. Clayborn Heron note, also requesting specific recommendations on sodium and fluid restrictions.

## 2017-02-13 NOTE — Telephone Encounter (Signed)
Increase Demadex to 40 mg BID until weight 176 lbs then back to 40 mg daily.

## 2017-02-16 NOTE — Telephone Encounter (Signed)
Spoke with Nira Conn at [facility] and made her aware of new orders per Dr. Tamala Julian.  Heather verbalized understanding.

## 2017-02-18 DIAGNOSIS — I2510 Atherosclerotic heart disease of native coronary artery without angina pectoris: Secondary | ICD-10-CM | POA: Diagnosis not present

## 2017-02-18 DIAGNOSIS — E1169 Type 2 diabetes mellitus with other specified complication: Secondary | ICD-10-CM | POA: Diagnosis not present

## 2017-02-18 DIAGNOSIS — L97522 Non-pressure chronic ulcer of other part of left foot with fat layer exposed: Secondary | ICD-10-CM | POA: Diagnosis not present

## 2017-02-18 DIAGNOSIS — E11621 Type 2 diabetes mellitus with foot ulcer: Secondary | ICD-10-CM | POA: Diagnosis not present

## 2017-02-18 DIAGNOSIS — M86172 Other acute osteomyelitis, left ankle and foot: Secondary | ICD-10-CM | POA: Diagnosis not present

## 2017-02-18 DIAGNOSIS — I10 Essential (primary) hypertension: Secondary | ICD-10-CM | POA: Diagnosis not present

## 2017-02-18 DIAGNOSIS — I739 Peripheral vascular disease, unspecified: Secondary | ICD-10-CM | POA: Diagnosis not present

## 2017-02-25 DIAGNOSIS — L97422 Non-pressure chronic ulcer of left heel and midfoot with fat layer exposed: Secondary | ICD-10-CM | POA: Diagnosis not present

## 2017-02-25 DIAGNOSIS — L97423 Non-pressure chronic ulcer of left heel and midfoot with necrosis of muscle: Secondary | ICD-10-CM | POA: Diagnosis not present

## 2017-02-25 DIAGNOSIS — I10 Essential (primary) hypertension: Secondary | ICD-10-CM | POA: Diagnosis not present

## 2017-02-25 DIAGNOSIS — E11621 Type 2 diabetes mellitus with foot ulcer: Secondary | ICD-10-CM | POA: Diagnosis not present

## 2017-02-25 DIAGNOSIS — E1151 Type 2 diabetes mellitus with diabetic peripheral angiopathy without gangrene: Secondary | ICD-10-CM | POA: Diagnosis not present

## 2017-02-25 DIAGNOSIS — I2510 Atherosclerotic heart disease of native coronary artery without angina pectoris: Secondary | ICD-10-CM | POA: Diagnosis not present
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
## 2017-03-01 ENCOUNTER — Encounter (INDEPENDENT_AMBULATORY_CARE_PROVIDER_SITE_OTHER): Payer: Self-pay

## 2017-03-01 ENCOUNTER — Ambulatory Visit (INDEPENDENT_AMBULATORY_CARE_PROVIDER_SITE_OTHER): Payer: Medicare HMO | Admitting: Interventional Cardiology

## 2017-03-01 ENCOUNTER — Encounter: Payer: Self-pay | Admitting: Interventional Cardiology

## 2017-03-01 VITALS — BP 132/82 | HR 72 | Ht 67.0 in | Wt 191.8 lb

## 2017-03-01 DIAGNOSIS — I70209 Unspecified atherosclerosis of native arteries of extremities, unspecified extremity: Secondary | ICD-10-CM

## 2017-03-01 DIAGNOSIS — I481 Persistent atrial fibrillation: Secondary | ICD-10-CM | POA: Diagnosis not present

## 2017-03-01 DIAGNOSIS — I25709 Atherosclerosis of coronary artery bypass graft(s), unspecified, with unspecified angina pectoris: Secondary | ICD-10-CM

## 2017-03-01 DIAGNOSIS — I5042 Chronic combined systolic (congestive) and diastolic (congestive) heart failure: Secondary | ICD-10-CM | POA: Diagnosis not present

## 2017-03-01 DIAGNOSIS — I4819 Other persistent atrial fibrillation: Secondary | ICD-10-CM

## 2017-03-01 NOTE — Patient Instructions (Signed)
Medication Instructions:  None  Labwork: None  Testing/Procedures: None  Follow-Up: Your physician wants you to follow-up in: 6-9 months with Dr. Tamala Julian.  You will receive a reminder letter in the mail two months in advance. If you don't receive a letter, please call our office to schedule the follow-up appointment.   Any Other Special Instructions Will Be Listed Below (If Applicable).     If you need a refill on your cardiac medications before your next appointment, please call your pharmacy.

## 2017-03-03 ENCOUNTER — Encounter: Payer: Self-pay | Admitting: Nurse Practitioner

## 2017-03-03 ENCOUNTER — Non-Acute Institutional Stay (SKILLED_NURSING_FACILITY): Payer: Medicare HMO | Admitting: Nurse Practitioner

## 2017-03-03 DIAGNOSIS — D649 Anemia, unspecified: Secondary | ICD-10-CM

## 2017-03-03 DIAGNOSIS — E0821 Diabetes mellitus due to underlying condition with diabetic nephropathy: Secondary | ICD-10-CM

## 2017-03-03 DIAGNOSIS — I481 Persistent atrial fibrillation: Secondary | ICD-10-CM

## 2017-03-03 DIAGNOSIS — I4819 Other persistent atrial fibrillation: Secondary | ICD-10-CM

## 2017-03-03 DIAGNOSIS — I5042 Chronic combined systolic (congestive) and diastolic (congestive) heart failure: Secondary | ICD-10-CM

## 2017-03-03 DIAGNOSIS — I10 Essential (primary) hypertension: Secondary | ICD-10-CM

## 2017-03-03 DIAGNOSIS — M86472 Chronic osteomyelitis with draining sinus, left ankle and foot: Secondary | ICD-10-CM
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
## 2017-03-04 DIAGNOSIS — I10 Essential (primary) hypertension: Secondary | ICD-10-CM | POA: Diagnosis not present

## 2017-03-04 DIAGNOSIS — L97422 Non-pressure chronic ulcer of left heel and midfoot with fat layer exposed: Secondary | ICD-10-CM | POA: Diagnosis not present

## 2017-03-04 DIAGNOSIS — E1169 Type 2 diabetes mellitus with other specified complication: Secondary | ICD-10-CM | POA: Diagnosis not present

## 2017-03-04 DIAGNOSIS — L97421 Non-pressure chronic ulcer of left heel and midfoot limited to breakdown of skin: Secondary | ICD-10-CM | POA: Diagnosis not present

## 2017-03-04 DIAGNOSIS — M869 Osteomyelitis, unspecified: Secondary | ICD-10-CM | POA: Diagnosis not present

## 2017-03-04 DIAGNOSIS — I2510 Atherosclerotic heart disease of native coronary artery without angina pectoris: Secondary | ICD-10-CM | POA: Diagnosis not present

## 2017-03-04 DIAGNOSIS — L97511 Non-pressure chronic ulcer of other part of right foot limited to breakdown of skin: Secondary | ICD-10-CM | POA: Diagnosis not present

## 2017-03-04 DIAGNOSIS — E11621 Type 2 diabetes mellitus with foot ulcer: Secondary | ICD-10-CM | POA: Diagnosis not present

## 2017-03-04 DIAGNOSIS — I739 Peripheral vascular disease, unspecified: Secondary | ICD-10-CM | POA: Diagnosis not present

## 2017-03-04 DIAGNOSIS — E1151 Type 2 diabetes mellitus with diabetic peripheral angiopathy without gangrene: Secondary | ICD-10-CM | POA: Diagnosis not present

## 2017-03-05 DIAGNOSIS — D649 Anemia, unspecified: Secondary | ICD-10-CM | POA: Diagnosis not present

## 2017-03-05 DIAGNOSIS — R6889 Other general symptoms and signs: Secondary | ICD-10-CM | POA: Diagnosis not present

## 2017-03-11 DIAGNOSIS — L97523 Non-pressure chronic ulcer of other part of left foot with necrosis of muscle: Secondary | ICD-10-CM | POA: Diagnosis not present

## 2017-03-11 DIAGNOSIS — E11621 Type 2 diabetes mellitus with foot ulcer: Secondary | ICD-10-CM | POA: Diagnosis not present

## 2017-03-18 DIAGNOSIS — E1142 Type 2 diabetes mellitus with diabetic polyneuropathy: Secondary | ICD-10-CM | POA: Insufficient documentation

## 2017-03-18 DIAGNOSIS — E1140 Type 2 diabetes mellitus with diabetic neuropathy, unspecified: Secondary | ICD-10-CM | POA: Diagnosis not present

## 2017-03-18 DIAGNOSIS — Z9889 Other specified postprocedural states: Secondary | ICD-10-CM | POA: Diagnosis not present

## 2017-03-18 DIAGNOSIS — E11621 Type 2 diabetes mellitus with foot ulcer: Secondary | ICD-10-CM | POA: Diagnosis not present

## 2017-03-18 DIAGNOSIS — I2510 Atherosclerotic heart disease of native coronary artery without angina pectoris: Secondary | ICD-10-CM | POA: Diagnosis not present

## 2017-03-18 DIAGNOSIS — E1151 Type 2 diabetes mellitus with diabetic peripheral angiopathy without gangrene: Secondary | ICD-10-CM | POA: Diagnosis not present

## 2017-03-18 DIAGNOSIS — L97511 Non-pressure chronic ulcer of other part of right foot limited to breakdown of skin: Secondary | ICD-10-CM | POA: Diagnosis not present

## 2017-03-18 DIAGNOSIS — L84 Corns and callosities: Secondary | ICD-10-CM | POA: Insufficient documentation

## 2017-03-18 DIAGNOSIS — L97523 Non-pressure chronic ulcer of other part of left foot with necrosis of muscle: Secondary | ICD-10-CM | POA: Diagnosis not present

## 2017-03-18 DIAGNOSIS — M21962 Unspecified acquired deformity of left lower leg: Secondary | ICD-10-CM | POA: Diagnosis not present

## 2017-03-18 DIAGNOSIS — I739 Peripheral vascular disease, unspecified: Secondary | ICD-10-CM | POA: Diagnosis not present

## 2017-03-18 DIAGNOSIS — I10 Essential (primary) hypertension: Secondary | ICD-10-CM | POA: Diagnosis not present

## 2017-03-22 DIAGNOSIS — I509 Heart failure, unspecified: Secondary | ICD-10-CM | POA: Diagnosis not present

## 2017-03-22 DIAGNOSIS — D649 Anemia, unspecified: Secondary | ICD-10-CM | POA: Diagnosis not present

## 2017-03-22 DIAGNOSIS — I10 Essential (primary) hypertension: Secondary | ICD-10-CM | POA: Diagnosis not present

## 2017-03-22 DIAGNOSIS — E119 Type 2 diabetes mellitus without complications: Secondary | ICD-10-CM | POA: Diagnosis not present

## 2017-03-22 LAB — HEMOGLOBIN A1C: Hemoglobin A1C: 9.2

## 2017-03-23 DIAGNOSIS — R062 Wheezing: Secondary | ICD-10-CM | POA: Diagnosis not present

## 2017-03-23 DIAGNOSIS — R0989 Other specified symptoms and signs involving the circulatory and respiratory systems: Secondary | ICD-10-CM | POA: Diagnosis not present

## 2017-03-25 DIAGNOSIS — I10 Essential (primary) hypertension: Secondary | ICD-10-CM | POA: Diagnosis not present

## 2017-03-25 DIAGNOSIS — E1142 Type 2 diabetes mellitus with diabetic polyneuropathy: Secondary | ICD-10-CM | POA: Diagnosis not present

## 2017-03-25 DIAGNOSIS — E1140 Type 2 diabetes mellitus with diabetic neuropathy, unspecified: Secondary | ICD-10-CM | POA: Diagnosis not present

## 2017-03-25 DIAGNOSIS — L97511 Non-pressure chronic ulcer of other part of right foot limited to breakdown of skin: Secondary | ICD-10-CM | POA: Diagnosis not present

## 2017-03-25 DIAGNOSIS — E11621 Type 2 diabetes mellitus with foot ulcer: Secondary | ICD-10-CM | POA: Diagnosis not present

## 2017-03-25 DIAGNOSIS — I2510 Atherosclerotic heart disease of native coronary artery without angina pectoris: Secondary | ICD-10-CM | POA: Diagnosis not present

## 2017-03-25 DIAGNOSIS — E1151 Type 2 diabetes mellitus with diabetic peripheral angiopathy without gangrene: Secondary | ICD-10-CM | POA: Diagnosis not present

## 2017-03-25 DIAGNOSIS — M21962 Unspecified acquired deformity of left lower leg: Secondary | ICD-10-CM | POA: Diagnosis not present

## 2017-03-25 DIAGNOSIS — Z9889 Other specified postprocedural states: Secondary | ICD-10-CM | POA: Diagnosis not present

## 2017-03-25 DIAGNOSIS — L97523 Non-pressure chronic ulcer of other part of left foot with necrosis of muscle: Secondary | ICD-10-CM | POA: Diagnosis not present

## 2017-03-25 DIAGNOSIS — I739 Peripheral vascular disease, unspecified: Secondary | ICD-10-CM | POA: Diagnosis not present

## 2017-04-05 DIAGNOSIS — I739 Peripheral vascular disease, unspecified: Secondary | ICD-10-CM | POA: Diagnosis not present

## 2017-04-05 DIAGNOSIS — L97519 Non-pressure chronic ulcer of other part of right foot with unspecified severity: Secondary | ICD-10-CM | POA: Diagnosis not present

## 2017-04-05 DIAGNOSIS — E1142 Type 2 diabetes mellitus with diabetic polyneuropathy: Secondary | ICD-10-CM | POA: Diagnosis not present

## 2017-04-05 DIAGNOSIS — L97422 Non-pressure chronic ulcer of left heel and midfoot with fat layer exposed: Secondary | ICD-10-CM | POA: Diagnosis not present

## 2017-04-05 DIAGNOSIS — L97511 Non-pressure chronic ulcer of other part of right foot limited to breakdown of skin: Secondary | ICD-10-CM | POA: Diagnosis not present

## 2017-04-05 DIAGNOSIS — I10 Essential (primary) hypertension: Secondary | ICD-10-CM | POA: Diagnosis not present

## 2017-04-05 DIAGNOSIS — L97421 Non-pressure chronic ulcer of left heel and midfoot limited to breakdown of skin: Secondary | ICD-10-CM | POA: Diagnosis not present

## 2017-04-05 DIAGNOSIS — E11621 Type 2 diabetes mellitus with foot ulcer: Secondary | ICD-10-CM | POA: Diagnosis not present

## 2017-04-15 ENCOUNTER — Encounter: Payer: Self-pay | Admitting: *Deleted

## 2017-04-15 DIAGNOSIS — E1142 Type 2 diabetes mellitus with diabetic polyneuropathy: Secondary | ICD-10-CM | POA: Diagnosis not present

## 2017-04-15 DIAGNOSIS — L84 Corns and callosities: Secondary | ICD-10-CM | POA: Diagnosis not present

## 2017-04-15 DIAGNOSIS — E559 Vitamin D deficiency, unspecified: Secondary | ICD-10-CM | POA: Diagnosis not present

## 2017-04-15 DIAGNOSIS — M21962 Unspecified acquired deformity of left lower leg: Secondary | ICD-10-CM | POA: Diagnosis not present

## 2017-04-15 DIAGNOSIS — E119 Type 2 diabetes mellitus without complications: Secondary | ICD-10-CM | POA: Diagnosis not present

## 2017-04-15 DIAGNOSIS — Z130 Encounter for screening for diseases of the blood and blood-forming organs and certain disorders involving the immune mechanism: Secondary | ICD-10-CM | POA: Diagnosis not present

## 2017-04-15 LAB — VITAMIN D 25 HYDROXY (VIT D DEFICIENCY, FRACTURES): Vit D, 25-Hydroxy: >60

## 2017-04-15 LAB — HM DIABETES FOOT EXAM

## 2017-04-16 ENCOUNTER — Encounter: Payer: Self-pay | Admitting: Nurse Practitioner

## 2017-04-16 ENCOUNTER — Non-Acute Institutional Stay (SKILLED_NURSING_FACILITY): Payer: Medicare HMO | Admitting: Nurse Practitioner

## 2017-04-16 DIAGNOSIS — M86472 Chronic osteomyelitis with draining sinus, left ankle and foot: Secondary | ICD-10-CM

## 2017-04-16 DIAGNOSIS — I5042 Chronic combined systolic (congestive) and diastolic (congestive) heart failure: Secondary | ICD-10-CM | POA: Diagnosis not present

## 2017-04-16 DIAGNOSIS — E785 Hyperlipidemia, unspecified: Secondary | ICD-10-CM | POA: Diagnosis not present

## 2017-04-16 DIAGNOSIS — E0821 Diabetes mellitus due to underlying condition with diabetic nephropathy: Secondary | ICD-10-CM

## 2017-04-16 DIAGNOSIS — I25709 Atherosclerosis of coronary artery bypass graft(s), unspecified, with unspecified angina pectoris: Secondary | ICD-10-CM | POA: Diagnosis not present

## 2017-04-16 NOTE — Progress Notes (Signed)
Patient ID: Cole [name], male   DOB: Mar 12, 1920, 83 y.o.   MRN: [id number]    Nursing Home Location:  [facility] Room: [place] of Service: SNF (31)  PCP: Josetta Huddle, MD  No Known Allergies  Chief Complaint  Patient presents with  . Medical Management of Chronic Issues    Resident is being seen for a routine visit.     HPI:  Patient is a 83 y.o. male seen today at [facility] for routine follow up . Pt with hx of htn, dm, CAD, CHF, bph. Pt has been doing well in the last month. No acute issues noted. A1c came back elevated and lantus added. No hypoglycemic episodes noted.  Pt was also discharged from wound center due to improvement in wound to left foot.  Pt denies increase in edema, shortness of breath. Denies use of sodium even though he has multiple salt containers to his bedside table.    Review of Systems:  Review of Systems  Constitutional: Negative for activity change, appetite change, fatigue and unexpected weight change.  HENT: Negative for congestion and hearing loss.   Eyes: Negative.   Respiratory: Negative for cough, chest tightness, shortness of breath and wheezing.   Cardiovascular: Positive for leg swelling. Negative for chest pain and palpitations.  Gastrointestinal: Negative for abdominal pain, constipation and diarrhea.  Genitourinary: Negative for difficulty urinating, dysuria and frequency.  Musculoskeletal: Negative for arthralgias and myalgias.  Skin: Positive for wound. Negative for color change.  Neurological: Negative for dizziness, weakness and light-headedness.  Psychiatric/Behavioral: Negative for agitation, behavioral problems and confusion.    Past Medical History:  Diagnosis Date  . Actinic keratitis   . CHF (congestive heart failure) ([place])   . Claudication ([place])   . Coronary artery disease    status post CABG, 1999  . Diabetes mellitus   . Diverticulosis   . DJD (degenerative joint disease)   .  Hypertension    hypertensive syndrome with dyspnea -[facility], February, 2011 - EF 50% and cardiac bypass grafts all patent - responded to diuretics and blood pressure control - [name]  . Microscopic hematuria   . Onychomycosis   . Prostate nodule   . PSVT (paroxysmal supraventricular tachycardia) ([place])   . Vitamin B12 deficiency   . Vitamin D deficiency    Past Surgical History:  Procedure Laterality Date  . APPENDECTOMY    . CORONARY ARTERY BYPASS GRAFT     Social History:   reports that he has quit smoking. His smoking use included Cigars. He has never used smokeless tobacco. He reports that he does not drink alcohol or use drugs.  Family History  Problem Relation Age of Onset  . Asthma Neg Hx   . COPD Neg Hx     Medications: Patient's Medications  New Prescriptions   No medications on file  Previous Medications   ACETAMINOPHEN (TYLENOL) 325 MG TABLET    Take 650 mg by mouth every 6 (six) hours as needed for mild pain.   AMLODIPINE (NORVASC) 5 MG TABLET    Take 5 mg by mouth daily.   CHOLECALCIFEROL (VITAMIN D) 1000 UNITS TABLET    Take 1,000 Units by mouth daily.   DOCUSATE SODIUM (COLACE) 100 MG CAPSULE    Take 100 mg by mouth daily.   FERROUS SULFATE 325 (65 FE) MG EC TABLET    Take 325 mg by mouth 2 (two) times daily after a meal.   FLUTICASONE (  FLONASE) 50 MCG/ACT NASAL SPRAY    Place 1 spray into both nostrils 2 (two) times daily as needed.    HYDRALAZINE (APRESOLINE) 10 MG TABLET    Take 10 mg by mouth 2 (two) times daily.   INSULIN GLARGINE (LANTUS SOLOSTAR) 100 UNIT/ML SOLOSTAR PEN    Inject 12 Units into the skin at bedtime.    ISOSORBIDE MONONITRATE (IMDUR) 30 MG 24 HR TABLET    Take 30 mg by mouth daily.   LATANOPROST (XALATAN) 0.005 % OPHTHALMIC SOLUTION    Place 1 drop into both eyes at bedtime.   LORATADINE (CLARITIN) 10 MG TABLET    Take 10 mg by mouth daily.   METOPROLOL (LOPRESSOR) 50 MG TABLET    TAKE 1 TABLET (50 MG TOTAL) BY  MOUTH 2 (TWO) TIMES DAILY.   MULTIPLE VITAMIN (MULTIVITAMIN) TABLET    Take 1 tablet by mouth daily.   NITROGLYCERIN (NITROSTAT) 0.4 MG SL TABLET    Place 0.4 mg under the tongue every 5 (five) minutes as needed for chest pain. Up to 3 doses   PANTOPRAZOLE (PROTONIX) 40 MG TABLET    Take 40 mg by mouth 2 (two) times daily.   POTASSIUM CHLORIDE (KLOR-CON) 20 MEQ PACKET    Take 20 mEq by mouth daily.   ROSUVASTATIN (CRESTOR) 5 MG TABLET    Take 1 tablet (5 mg total) by mouth 3 (three) times a week. Take 1 tablet Mon, Wed, Fri   TAMSULOSIN (FLOMAX) 0.4 MG CAPS CAPSULE    Take 0.4 mg by mouth at bedtime.    TORSEMIDE (DEMADEX) 20 MG TABLET    Take 40 mg by mouth daily.   VITAMIN B-12 (CYANOCOBALAMIN) 1000 MCG TABLET    Take 1,000 mcg by mouth daily.    VITAMIN C (ASCORBIC ACID) 500 MG TABLET    Take 500 mg by mouth 2 (two) times daily.   Modified Medications   No medications on file  Discontinued Medications   GLIPIZIDE (GLUCOTROL) 5 MG TABLET    Take 5 mg by mouth daily before breakfast.      Physical Exam: Vitals:   04/16/17 1427  BP: 128/63  Pulse: 65  Resp: 18  Temp: 97 F (36.1 C)  SpO2: 96%  Weight: 186 lb 12.8 oz (84.7 kg)  Height: 5\' 7"  (1.702 m)    Physical Exam  Constitutional: He is oriented to person, place, and time. He appears well-developed and well-nourished. No distress.  HENT:  Head: Normocephalic and atraumatic.  Mouth/Throat: Oropharynx is clear and moist.  Eyes: Pupils are equal, round, and reactive to light.  Neck: Neck supple. No hepatojugular reflux and no JVD present. Carotid bruit is not present.  Cardiovascular: Normal rate, regular rhythm, S1 normal, S2 normal, normal heart sounds and intact distal pulses.  Exam reveals decreased pulses.   Pulses:      Popliteal pulses are 1+ on the right side, and 1+ on the left side.       Dorsalis pedis pulses are 1+ on the right side, and 1+ on the left side.  Pulmonary/Chest: Effort normal and breath sounds  normal. No accessory muscle usage. No tachypnea. No respiratory distress.  Abdominal: Soft. Bowel sounds are normal. He exhibits no distension, no abdominal bruit and no pulsatile midline mass. There is no tenderness. There is no rebound.  Musculoskeletal: He exhibits edema (2+). He exhibits no tenderness.  Neurological: He is alert and oriented to person, place, and time.  Skin: Skin is warm and dry.  Eschar to plantar surface of base of left great toe  Psychiatric: He has a normal mood and affect.    Labs reviewed: Basic Metabolic Panel:  Recent Labs  10/20/16 0007 10/20/16 0046 10/20/16 0717 10/21/16 0316  01/18/17 01/22/17 02/11/17  NA 136 138 136 138  < > 137 142 138  K 4.2 4.2 3.7 4.0  < > 4.1 4.2 3.9  CL 103 101 99* 101  --   --   --   --   CO2 23  --  26 28  --   --   --   --   GLUCOSE 233* 240* 231* 132*  --   --   --   --   BUN 28* 36* 29* 30*  < > 46* 41* 41*  CREATININE 1.56* 1.60* 1.65* 1.73*  < > 1.5* 1.5* 1.5*  CALCIUM 9.1  --  8.8* 8.6*  --   --   --   --   MG 1.8  --   --  2.1  --   --   --   --   < > = values in this interval not displayed. Liver Function Tests:  Recent Labs  10/20/16 0007 10/20/16 0717 12/04/16  AST 73* 41 23  ALT 39 33 28  ALKPHOS 77 64 44  BILITOT 1.0 1.3*  --   PROT 7.2 6.5  --   ALBUMIN 3.5 3.3*  --    No results for input(s): LIPASE, AMYLASE in the last 8760 hours. No results for input(s): AMMONIA in the last 8760 hours. CBC:  Recent Labs  10/05/16 0037 10/20/16 0007  10/20/16 0717 10/21/16 0316  12/22/16 12/23/16 01/06/17 02/11/17  WBC 19.6* 21.5*  --  13.9* 9.0  < > 10.6  --  7.6 10.0  NEUTROABS 16.7* 18.6*  --  11.4*  --   --   --   --   --   --   HGB 12.1* 11.1*  < > 9.9* 8.9*  < > 7.7* 7.4* 9.6* 12.0*  HCT 36.6* 33.3*  < > 30.6* 27.1*  < > 23* 23*  23* 32* 37*  MCV 88.2 87.9  --  87.4 87.7  --   --   --   --   --   PLT 214 158  --  181 173  < > 186  --  172 161  < > = values in this interval not  displayed. TSH:  Recent Labs  02/11/17  TSH 2.52   A1C: Lab Results  Component Value Date   HGBA1C 9.2 03/22/2017    Lipid Panel: No results for input(s): CHOL, HDL, LDLCALC, TRIG, CHOLHDL, LDLDIRECT in the last 8760 hours. BNP 08/06/16: 375.4  Assessment/Plan 1. Chronic combined systolic and diastolic CHF (congestive heart failure) (HCC) Stable at this time. Again encouraged diet compliance. conts on Demadex and potassium supplement  2. Coronary artery disease involving coronary bypass graft of native heart with angina pectoris (HCC) Stable, no chest pain noted. conts on imdur  3. Diabetes mellitus due to underlying condition, controlled, with diabetic nephropathy, without long-term current use of insulin (HCC) A1c worse on recent labs, lantus started. Will cont to monitor blood sugars, may need adjustment of insulin.  Will get urine for micro,ophthalmology for diabetic eye exam and podiatry consult.    4. Chronic osteomyelitis of left foot with draining sinus (HCC) Improved, discharge from wound care clinic, to follow with facility wound care team  5. Dyslipidemia Will follow up fasting  lipids, currently on crestor 3 times weekly   Ressie Slevin K. [name]  [facility] & [facility] [phone] 8 am - 5 pm) [phone] (after hours)

## 2017-04-17 LAB — LIPID PANEL
CHOLESTEROL: 221 mg/dL — AB (ref 0–200)
HDL: 51 mg/dL (ref 35–70)
LDL Cholesterol: 135 mg/dL
LDl/HDL Ratio: 4.3
Triglycerides: 174 mg/dL — AB (ref 40–160)

## 2017-04-27 ENCOUNTER — Non-Acute Institutional Stay (SKILLED_NURSING_FACILITY): Payer: Medicare HMO | Admitting: Internal Medicine

## 2017-04-27 ENCOUNTER — Encounter: Payer: Self-pay | Admitting: Internal Medicine

## 2017-04-27 DIAGNOSIS — M86472 Chronic osteomyelitis with draining sinus, left ankle and foot: Secondary | ICD-10-CM

## 2017-04-27 DIAGNOSIS — I70209 Unspecified atherosclerosis of native arteries of extremities, unspecified extremity: Secondary | ICD-10-CM

## 2017-04-27 DIAGNOSIS — E1151 Type 2 diabetes mellitus with diabetic peripheral angiopathy without gangrene: Secondary | ICD-10-CM

## 2017-04-27 NOTE — Patient Instructions (Signed)
See assessment and plan under each diagnosis in the problem list and acutely for this visit 

## 2017-04-27 NOTE — Assessment & Plan Note (Signed)
04/27/17 released from [place] Wound care monitor at [facility]

## 2017-04-27 NOTE — Assessment & Plan Note (Addendum)
Poorly controlled diabetes with A1c of 9.2 %; risk of secondary complications, particularly infection and poor wound healing discussed with the patient Adjust basal insulin to 15 units  Ophthalmologic follow-up at next SNF visit Read all labels ; avoid foods & drinks with  High Fructose Corn Syrup as #1, 2 , 3 or # 4 on label Note : dividing the "grams of sugar" on label by 4 gives "teaspoons of sugar" content of food or drink. For example a 22 oz Coke has 68 grams of sugar or 17 tsp of sugar).

## 2017-04-27 NOTE — Progress Notes (Signed)
NURSING HOME LOCATION:  [facility] ROOM NUMBER:  [id number]  CODE STATUS:  Full Code  PCP:  Josetta Huddle, MD [address] [place] [address]  This is a nursing facility follow up for specific acute issue of uncontrolled glucoses.  Interim medical record and care since last [place] visit was updated with review of diagnostic studies and change in clinical status since last visit were documented.  HPI: Glucose has been noted to be as high as 371; basal insulin was initiated last month because of a hemoglobin A1c of 9.2%. He does describe frequency, polyuria, and nocturia. He is unable to define how may times he has nocturia. He has no other symptoms related to the diabetes.  Wound care at  [facility] has released him. Wound care nurse at SNF continues to follow him. He has noted no associated signs of cellulitis or abscess related to the leg wounds. If he stands he has progressive edema of lower extremities. It does resolve when he is supine. He is concerned as he has gained significant amount of weight centrally. He relates this to not being able to exercise. He states that having the cast on the left lower extremity has resulted in atrophy of those muscles with associated weakness.  Review of systems is also positive for occasional small stool but no other bowel changes.  Review of systems:  Constitutional: No fever, fatigue  Eyes: No redness, discharge, pain, vision change ENT/mouth: No nasal congestion,  purulent discharge, earache,change in hearing ,sore throat  Cardiovascular: No chest pain, palpitations,paroxysmal nocturnal dyspnea, claudication Respiratory: No cough, sputum production,hemoptysis, DOE , significant snoring,apnea  Gastrointestinal: No heartburn,dysphagia,abdominal pain, nausea / vomiting,rectal bleeding, melena,change in bowels Genitourinary: No dysuria,hematuria, pyuria,  incontinence Musculoskeletal: No joint stiffness, joint swelling,  pain Dermatologic: No new rash, pruritus, change in appearance of skin Neurologic: No dizziness,headache,syncope, seizures, numbness , tingling Psychiatric: No significant anxiety , depression, insomnia, anorexia Endocrine: No change in hair/skin/ nails, excessive thirst, excessive hunger Hematologic/lymphatic: No significant bruising, lymphadenopathy,abnormal bleeding Allergy/immunology: No itchy/ watery eyes, significant sneezing, urticaria, angioedema  Physical exam:  Pertinent or positive findings: pattern alopecia is present. Pupils are small. Dense arcus senilis is present. He is edentulous. Chest is surprisingly clear. Heart sounds are irregular and distant. Abdomen is massive. Pedal pulses are decreased. He has tense nonpitting edema of the left lower extremity. There is no cellulitis noted over the shin. He has 1+ edema at the right sock line.   degenerative joint changes in the hands are present. He has pitting of the nails of the left hand. Keratotic lesions are present particularly over the scalp and shin.   General appearance:Adequately nourished; no acute distress , increased work of breathing is present.   Lymphatic: No lymphadenopathy about the head, neck, axilla . Eyes: No conjunctival inflammation or lid edema is present. There is no scleral icterus. Ears:  External ear exam shows no significant lesions or deformities.   Nose:  External nasal examination shows no deformity or inflammation. Nasal mucosa are pink and moist without lesions ,exudates Oral exam: lips and gums are healthy appearing.There is no oropharyngeal erythema or exudate . Neck:  No thyromegaly, masses, tenderness noted.    Heart:  No gallop, murmur, click, rub .  Lungs: without wheezes, rhonchi,rales , rubs. Abdomen:Bowel sounds are normal. Abdomen is soft and nontender with no organomegaly, hernias,masses. GU: deferred  Extremities:  No cyanosis, clubbing  Neurologic exam : Strength equal  in upper &  lower  extremities Balance,Rhomberg,finger to nose testing could not be completed due to clinical state Skin: Warm & dry w/o tenting. No significant  rash.  See summary under each active problem in the Problem List with associated updated therapeutic plan

## 2017-04-28 ENCOUNTER — Encounter: Payer: Self-pay | Admitting: Internal Medicine

## 2017-05-04 ENCOUNTER — Non-Acute Institutional Stay (SKILLED_NURSING_FACILITY): Payer: Medicare HMO | Admitting: Adult Health

## 2017-05-04 ENCOUNTER — Encounter: Payer: Self-pay | Admitting: Adult Health

## 2017-05-04 DIAGNOSIS — K5901 Slow transit constipation: Secondary | ICD-10-CM

## 2017-05-04 DIAGNOSIS — E1151 Type 2 diabetes mellitus with diabetic peripheral angiopathy without gangrene: Secondary | ICD-10-CM | POA: Diagnosis not present

## 2017-05-04 DIAGNOSIS — N183 Chronic kidney disease, stage 3 unspecified: Secondary | ICD-10-CM

## 2017-05-04 DIAGNOSIS — D638 Anemia in other chronic diseases classified elsewhere: Secondary | ICD-10-CM

## 2017-05-04 DIAGNOSIS — I70209 Unspecified atherosclerosis of native arteries of extremities, unspecified extremity: Secondary | ICD-10-CM | POA: Diagnosis not present

## 2017-05-04 DIAGNOSIS — I5032 Chronic diastolic (congestive) heart failure: Secondary | ICD-10-CM

## 2017-05-04 NOTE — Progress Notes (Addendum)
DATE:  05/04/2017   MRN:  [id number]  BIRTHDAY: 1920/05/23  Facility:  Nursing Home Location:  [facility] Room Number: 983-J  LEVEL OF CARE:  SNF (31)  Contact Information    Name Relation Home Work Mobile   [place] Denman George [phone]     [place] [phone]         Code Status History    Date Active Date Inactive Code Status Order ID Comments User Context   10/20/2016  6:10 AM 10/21/2016  5:48 PM DNR [id number]  Rise Patience, MD Inpatient    Questions for Most Recent Historical Code Status (Order [id number])    Question Answer Comment   In the event of cardiac or respiratory ARREST Do not call a "code blue"    In the event of cardiac or respiratory ARREST Do not perform Intubation, CPR, defibrillation or ACLS    In the event of cardiac or respiratory ARREST Use medication by any route, position, wound care, and other measures to relive pain and suffering. May use oxygen, suction and manual treatment of airway obstruction as needed for comfort.        Chief Complaint  Patient presents with  . Acute Visit    Diabetes management    HISTORY OF PRESENT ILLNESS:  This is a 81-YO male seen for an acute visit for diabetes management. CBG reviewed and were noted to be consistently high -  271, 310, 296, 394, 244. Last hgbA1c is 9.2% . He has PMH of CHF, DM, CAD S/P CABG and HTN. He was seen in his room today. Left foot noted to have dry scab and 2+ edema. He complained of constipation X 2 days. Latest hgb is 12.0 and currently on FeSO4 BID.    PAST MEDICAL HISTORY:  Past Medical History:  Diagnosis Date  . Actinic keratitis   . CHF (congestive heart failure) ([place])   . Claudication ([place])   . Coronary artery disease    status post CABG, 1999  . Diabetes mellitus   . Diverticulosis   . DJD (degenerative joint disease)   . Hypertension    hypertensive syndrome with dyspnea -[facility], February, 2011 - EF 50%  and cardiac bypass grafts all patent - responded to diuretics and blood pressure control - [name]  . Microscopic hematuria   . Onychomycosis   . Prostate nodule   . PSVT (paroxysmal supraventricular tachycardia) ([place])   . Vitamin B12 deficiency   . Vitamin D deficiency      CURRENT MEDICATIONS: Reviewed  Patient's Medications  New Prescriptions   No medications on file  Previous Medications   ACETAMINOPHEN (TYLENOL) 325 MG TABLET    Take 650 mg by mouth every 6 (six) hours as needed for mild pain.   AMINO ACIDS-PROTEIN HYDROLYS (FEEDING SUPPLEMENT, PRO-STAT SUGAR FREE 64,) LIQD    Take 30 mLs by mouth 3 (three) times daily.   AMLODIPINE (NORVASC) 5 MG TABLET    Take 5 mg by mouth daily.   CHOLECALCIFEROL (VITAMIN D) 1000 UNITS TABLET    Take 1,000 Units by mouth daily.   DOCUSATE SODIUM (COLACE) 100 MG CAPSULE    Take 100 mg by mouth daily.   FERROUS SULFATE 325 (65 FE) MG EC TABLET    Take 325 mg by mouth 2 (two) times daily after a meal.   FLUTICASONE (FLONASE) 50 MCG/ACT NASAL SPRAY    Place 1 spray into both nostrils 2 (two) times  daily as needed.    HYDRALAZINE (APRESOLINE) 10 MG TABLET    Take 10 mg by mouth 2 (two) times daily.   INSULIN GLARGINE (LANTUS SOLOSTAR) 100 UNIT/ML SOLOSTAR PEN    Inject 15 Units into the skin at bedtime.    ISOSORBIDE MONONITRATE (IMDUR) 30 MG 24 HR TABLET    Take 30 mg by mouth daily.    LATANOPROST (XALATAN) 0.005 % OPHTHALMIC SOLUTION    Place 1 drop into both eyes at bedtime.    LORATADINE (CLARITIN) 10 MG TABLET    Take 10 mg by mouth daily.    METOPROLOL (LOPRESSOR) 50 MG TABLET    TAKE 1 TABLET (50 MG TOTAL) BY MOUTH 2 (TWO) TIMES DAILY.   MULTIPLE VITAMIN (MULTIVITAMIN) TABLET    Take 1 tablet by mouth daily.   NITROGLYCERIN (NITROSTAT) 0.4 MG SL TABLET    Place 0.4 mg under the tongue every 5 (five) minutes as needed for chest pain. Up to 3 doses   PANTOPRAZOLE (PROTONIX) 40 MG TABLET    Take 40 mg by mouth 2 (two) times daily.    POTASSIUM CHLORIDE (KLOR-CON) 20 MEQ PACKET    Take 20 mEq by mouth daily.   ROSUVASTATIN (CRESTOR) 5 MG TABLET    Take 1 tablet (5 mg total) by mouth 3 (three) times a week. Take 1 tablet Mon, Wed, Fri   TAMSULOSIN (FLOMAX) 0.4 MG CAPS CAPSULE    Take 0.4 mg by mouth at bedtime.    TORSEMIDE (DEMADEX) 20 MG TABLET    Take 40 mg by mouth daily. Take 2 tablets to = 40 mg BID until weight is 176 lbs and then decrease to 40 mg once a day   VITAMIN B-12 (CYANOCOBALAMIN) 1000 MCG TABLET    Take 1,000 mcg by mouth daily.    VITAMIN C (ASCORBIC ACID) 500 MG TABLET    Take 500 mg by mouth 2 (two) times daily.   Modified Medications   No medications on file  Discontinued Medications   No medications on file     No Known Allergies   REVIEW OF SYSTEMS:  GENERAL: no change in appetite, no fatigue, no weight changes, no fever, chills or weakness EYES: Denies change in vision, dry eyes, eye pain, itching or discharge EARS: Denies change in hearing, ringing in ears, or earache NOSE: Denies nasal congestion or epistaxis MOUTH and THROAT: Denies oral discomfort, gingival pain or bleeding, pain from teeth or hoarseness   RESPIRATORY: no cough, SOB, DOE, wheezing, hemoptysis CARDIAC: no chest pain, edema or palpitations GI: no abdominal pain, diarrhea, heart burn, nausea or vomiting, +constipation GU: Denies dysuria, frequency, hematuria, incontinence, or discharge PSYCHIATRIC: Denies feeling of depression or anxiety. No report of hallucinations, insomnia, paranoia, or agitation   PHYSICAL EXAMINATION  GENERAL APPEARANCE: Well nourished. In no acute distress. Normal body habitus SKIN:  Left foot with dry scab, no redness HEAD: Normal in size and contour. No evidence of trauma EYES: Lids open and close normally. No blepharitis, entropion or ectropion. PERRL. Conjunctivae are clear and sclerae are white. Lenses are without opacity EARS: Pinnae are normal. Patient hears normal voice tunes of the  examiner MOUTH and THROAT: Lips are without lesions. Oral mucosa is moist and without lesions. Tongue is normal in shape, size, and color and without lesions NECK: supple, trachea midline, no neck masses, no thyroid tenderness, no thyromegaly LYMPHATICS: no LAN in the neck, no supraclavicular LAN RESPIRATORY: breathing is even & unlabored, BS CTAB CARDIAC: RRR, no murmur,no  extra heart sounds, no edema GI: abdomen soft, normal BS, no masses, no tenderness, no hepatomegaly, no splenomegaly EXTREMITIES:  Able to move X 4 extremities PSYCHIATRIC: Alert and oriented X 3. Affect and behavior are appropriate  LABS/RADIOLOGY: Labs reviewed: Basic Metabolic Panel:  Recent Labs  10/20/16 0007 10/20/16 0046 10/20/16 0717 10/21/16 0316  01/18/17 01/22/17 02/11/17  NA 136 138 136 138  < > 137 142 138  K 4.2 4.2 3.7 4.0  < > 4.1 4.2 3.9  CL 103 101 99* 101  --   --   --   --   CO2 23  --  26 28  --   --   --   --   GLUCOSE 233* 240* 231* 132*  --   --   --   --   BUN 28* 36* 29* 30*  < > 46* 41* 41*  CREATININE 1.56* 1.60* 1.65* 1.73*  < > 1.5* 1.5* 1.5*  CALCIUM 9.1  --  8.8* 8.6*  --   --   --   --   MG 1.8  --   --  2.1  --   --   --   --   < > = values in this interval not displayed. Liver Function Tests:  Recent Labs  10/20/16 0007 10/20/16 0717 12/04/16  AST 73* 41 23  ALT 39 33 28  ALKPHOS 77 64 44  BILITOT 1.0 1.3*  --   PROT 7.2 6.5  --   ALBUMIN 3.5 3.3*  --    CBC:  Recent Labs  10/05/16 0037 10/20/16 0007  10/20/16 0717 10/21/16 0316  12/22/16 12/23/16 01/06/17 02/11/17  WBC 19.6* 21.5*  --  13.9* 9.0  < > 10.6  --  7.6 10.0  NEUTROABS 16.7* 18.6*  --  11.4*  --   --   --   --   --   --   HGB 12.1* 11.1*  < > 9.9* 8.9*  < > 7.7* 7.4* 9.6* 12.0*  HCT 36.6* 33.3*  < > 30.6* 27.1*  < > 23* 23*  23* 32* 37*  MCV 88.2 87.9  --  87.4 87.7  --   --   --   --   --   PLT 214 158  --  181 173  < > 186  --  172 161  < > = values in this interval not displayed. Lipid  Panel:  Recent Labs  04/17/17  HDL 51   Cardiac Enzymes:  Recent Labs  10/20/16 0717 10/20/16 1219 10/20/16 1825  TROPONINI 0.03* <0.03 0.04*   CBG:  Recent Labs  10/20/16 2108 10/21/16 0616 10/21/16 1138  GLUCAP 213* 160* 245*     ASSESSMENT/PLAN:  1. 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 Slow transit constipation - start senna S 8.6-50 mg 2 tabs by mouth daily at bedtime   3. Chronic diastolic heart failure (HCC) - no SOB; continue torsemide 20 mg daily 2 tabs = 40 mg by mouth twice a day until weight is 107. 6 lbs then decrease to 40 mg daily, current weight is 189.2 lbs   4. Chronic kidney disease, stage III (moderate) - GFR 40.15, re-check BMP Lab Results  Component Value Date   CREATININE 1.5 (A) 02/11/2017  5. Anemia of chronic disease - discontinue FeSO4; re-check CBC in 1 month     Donice Alperin C. [place] - NP    [employer] [phone]

## 2017-05-05 DIAGNOSIS — D649 Anemia, unspecified: Secondary | ICD-10-CM | POA: Diagnosis not present

## 2017-05-05 DIAGNOSIS — E119 Type 2 diabetes mellitus without complications: Secondary | ICD-10-CM | POA: Diagnosis not present

## 2017-05-05 DIAGNOSIS — I10 Essential (primary) hypertension: Secondary | ICD-10-CM | POA: Diagnosis not present

## 2017-05-05 LAB — BASIC METABOLIC PANEL
BUN: 49 — AB (ref 4–21)
CREATININE: 1.8 — AB (ref 0.6–1.3)
GLUCOSE: 255
POTASSIUM: 4.2 (ref 3.4–5.3)
SODIUM: 138 (ref 137–147)

## 2017-05-11 DIAGNOSIS — D649 Anemia, unspecified: Secondary | ICD-10-CM | POA: Diagnosis not present

## 2017-05-11 DIAGNOSIS — I10 Essential (primary) hypertension: Secondary | ICD-10-CM | POA: Diagnosis not present

## 2017-05-11 LAB — BASIC METABOLIC PANEL
BUN: 59 — AB (ref 4–21)
Creatinine: 2 — AB (ref 0.6–1.3)
Glucose: 295
Potassium: 3.6 (ref 3.4–5.3)
SODIUM: 135 — AB (ref 137–147)

## 2017-05-16 DIAGNOSIS — J189 Pneumonia, unspecified organism: Secondary | ICD-10-CM

## 2017-05-16 HISTORY — DX: Pneumonia, unspecified organism: J18.9

## 2017-05-17 ENCOUNTER — Non-Acute Institutional Stay (SKILLED_NURSING_FACILITY): Payer: Medicare HMO | Admitting: Adult Health

## 2017-05-17 ENCOUNTER — Encounter: Payer: Self-pay | Admitting: Adult Health

## 2017-05-17 DIAGNOSIS — K5900 Constipation, unspecified: Secondary | ICD-10-CM | POA: Diagnosis not present

## 2017-05-17 DIAGNOSIS — H409 Unspecified glaucoma: Secondary | ICD-10-CM

## 2017-05-17 DIAGNOSIS — N184 Chronic kidney disease, stage 4 (severe): Secondary | ICD-10-CM

## 2017-05-17 DIAGNOSIS — N400 Enlarged prostate without lower urinary tract symptoms: Secondary | ICD-10-CM | POA: Diagnosis not present

## 2017-05-17 DIAGNOSIS — E876 Hypokalemia: Secondary | ICD-10-CM | POA: Diagnosis not present

## 2017-05-17 DIAGNOSIS — I5032 Chronic diastolic (congestive) heart failure: Secondary | ICD-10-CM

## 2017-05-17 DIAGNOSIS — I10 Essential (primary) hypertension: Secondary | ICD-10-CM | POA: Diagnosis not present

## 2017-05-17 DIAGNOSIS — Z794 Long term (current) use of insulin: Secondary | ICD-10-CM

## 2017-05-17 DIAGNOSIS — E1122 Type 2 diabetes mellitus with diabetic chronic kidney disease: Secondary | ICD-10-CM | POA: Diagnosis not present

## 2017-05-17 DIAGNOSIS — J309 Allergic rhinitis, unspecified: Secondary | ICD-10-CM

## 2017-05-17 DIAGNOSIS — I25810 Atherosclerosis of coronary artery bypass graft(s) without angina pectoris: Secondary | ICD-10-CM | POA: Diagnosis not present

## 2017-05-17 DIAGNOSIS — E785 Hyperlipidemia, unspecified: Secondary | ICD-10-CM | POA: Diagnosis not present

## 2017-05-17 NOTE — Progress Notes (Signed)
DATE:  05/17/2017 *-  MRN:  [id number]  BIRTHDAY: 1920-02-21  Facility:  Nursing Home Location:  [facility] Room Number: [id number]  LEVEL OF CARE:  SNF (31)  Contact Information    Name Relation Home Work Mobile   [place] Denman George [phone]     [place] [phone]         Code Status History    Date Active Date Inactive Code Status Order ID Comments User Context   10/20/2016  6:10 AM 10/21/2016  5:48 PM DNR [id number]  Rise Patience, MD Inpatient    Questions for Most Recent Historical Code Status (Order [id number])    Question Answer Comment   In the event of cardiac or respiratory ARREST Do not call a "code blue"    In the event of cardiac or respiratory ARREST Do not perform Intubation, CPR, defibrillation or ACLS    In the event of cardiac or respiratory ARREST Use medication by any route, position, wound care, and other measures to relive pain and suffering. May use oxygen, suction and manual treatment of airway obstruction as needed for comfort.        Chief Complaint  Patient presents with  . Medical Management of Chronic Issues    RV    HISTORY OF PRESENT ILLNESS:  This is a 70-YO male seen for a routine visit.  He is a long-term care resident of [facility].  Lantus dosage was recently increased and referred to endocrinology due to uncontrolled diabetes mellitus. He has PMH of coronary artery disease S/P CABG. Hypertension, diabetes mellitus and hyperlipidemia.   PAST MEDICAL HISTORY:  Past Medical History:  Diagnosis Date  . Actinic keratitis   . CHF (congestive heart failure) ([place])   . Claudication ([place])   . Coronary artery disease    status post CABG, 1999  . Diabetes mellitus   . Diverticulosis   . DJD (degenerative joint disease)   . Hypertension    hypertensive syndrome with dyspnea -[facility], February, 2011 - EF 50% and cardiac bypass grafts all patent - responded  to diuretics and blood pressure control - [name]  . Microscopic hematuria   . Onychomycosis   . Prostate nodule   . PSVT (paroxysmal supraventricular tachycardia) ([place])   . Vitamin B12 deficiency   . Vitamin D deficiency      CURRENT MEDICATIONS: Reviewed  Patient's Medications  New Prescriptions   No medications on file  Previous Medications   ACETAMINOPHEN (TYLENOL) 325 MG TABLET    Take 650 mg by mouth every 6 (six) hours as needed for mild pain.   AMINO ACIDS-PROTEIN HYDROLYS (FEEDING SUPPLEMENT, PRO-STAT SUGAR FREE 64,) LIQD    Take 30 mLs by mouth 3 (three) times daily.   AMLODIPINE (NORVASC) 5 MG TABLET    Take 5 mg by mouth daily.   CHOLECALCIFEROL (VITAMIN D) 1000 UNITS TABLET    Take 1,000 Units by mouth daily.   DOCUSATE SODIUM (COLACE) 100 MG CAPSULE    Take 100 mg by mouth daily.   FLUTICASONE (FLONASE) 50 MCG/ACT NASAL SPRAY    Place 1 spray into both nostrils 2 (two) times daily as needed.    HYDRALAZINE (APRESOLINE) 10 MG TABLET    Take 10 mg by mouth 2 (two) times daily.   INSULIN ASPART (NOVOLOG) 100 UNIT/ML INJECTION    Inject 5 Units into the skin daily as needed for high blood sugar (if blood glucose >150).  INSULIN GLARGINE (LANTUS SOLOSTAR) 100 UNIT/ML SOLOSTAR PEN    Inject 34 Units into the skin at bedtime.    ISOSORBIDE MONONITRATE (IMDUR) 30 MG 24 HR TABLET    Take 30 mg by mouth daily.    LATANOPROST (XALATAN) 0.005 % OPHTHALMIC SOLUTION    Place 1 drop into both eyes at bedtime.    LORATADINE (CLARITIN) 10 MG TABLET    Take 10 mg by mouth daily.    METOPROLOL (LOPRESSOR) 50 MG TABLET    TAKE 1 TABLET (50 MG TOTAL) BY MOUTH 2 (TWO) TIMES DAILY.   MULTIPLE VITAMIN (MULTIVITAMIN) TABLET    Take 1 tablet by mouth daily.   NITROGLYCERIN (NITROSTAT) 0.4 MG SL TABLET    Place 0.4 mg under the tongue every 5 (five) minutes as needed for chest pain. Up to 3 doses   PANTOPRAZOLE (PROTONIX) 40 MG TABLET    Take 40 mg by mouth 2 (two) times daily.   POTASSIUM  CHLORIDE (KLOR-CON) 20 MEQ PACKET    Take 20 mEq by mouth daily.   ROSUVASTATIN (CRESTOR) 5 MG TABLET    Take 1 tablet (5 mg total) by mouth 3 (three) times a week. Take 1 tablet Mon, Wed, Fri   SENNOSIDES-DOCUSATE SODIUM (SENOKOT-S) 8.6-50 MG TABLET    Take 2 tablets by mouth at bedtime.   TAMSULOSIN (FLOMAX) 0.4 MG CAPS CAPSULE    Take 0.4 mg by mouth at bedtime.    TORSEMIDE (DEMADEX) 20 MG TABLET    Take 40 mg by mouth daily. Take 2 tablets to = 40 mg BID until weight is 176 lbs and then decrease to 40 mg once a day   VITAMIN B-12 (CYANOCOBALAMIN) 1000 MCG TABLET    Take 1,000 mcg by mouth daily.    VITAMIN C (ASCORBIC ACID) 500 MG TABLET    Take 500 mg by mouth 2 (two) times daily.   Modified Medications   No medications on file  Discontinued Medications   FERROUS SULFATE 325 (65 FE) MG EC TABLET    Take 325 mg by mouth 2 (two) times daily after a meal.     No Known Allergies   REVIEW OF SYSTEMS:  GENERAL: no change in appetite, no fatigue, no weight changes, no fever, chills or weakness EYES: Denies change in vision, dry eyes, eye pain, itching or discharge EARS: Denies change in hearing, ringing in ears, or earache NOSE: Denies nasal congestion or epistaxis MOUTH and THROAT: Denies oral discomfort, gingival pain or bleeding, pain from teeth or hoarseness   RESPIRATORY: no cough, SOB, DOE, wheezing, hemoptysis CARDIAC: no chest pain or palpitations GI: no abdominal pain, diarrhea, constipation, heart burn, nausea or vomiting GU: Denies dysuria, frequency, hematuria, incontinence, or discharge PSYCHIATRIC: Denies feeling of depression or anxiety. No report of hallucinations, insomnia, paranoia, or agitation    PHYSICAL EXAMINATION  GENERAL APPEARANCE: Well nourished. In no acute distress. Normal body habitus SKIN:  Skin is warm and dry.  HEAD: Normal in size and contour. No evidence of trauma EYES: Lids open and close normally. No blepharitis, entropion or ectropion. PERRL.  Conjunctivae are clear and sclerae are white. Lenses are without opacity EARS: Pinnae are normal. Patient hears normal voice tunes of the examiner MOUTH and THROAT: Lips are without lesions. Oral mucosa is moist and without lesions. Tongue is normal in shape, size, and color and without lesions NECK: supple, trachea midline, no neck masses, no thyroid tenderness, no thyromegaly LYMPHATICS: no LAN in the neck, no supraclavicular LAN RESPIRATORY:  breathing is even & unlabored, BS CTAB CARDIAC: RRR, no murmur,no extra heart sounds, BLE 2+ edema GI: abdomen soft, normal BS, no masses, no tenderness, no hepatomegaly, no splenomegaly EXTREMITIES:  Able to move X 4 extremities PSYCHIATRIC: Alert and oriented X 3. Affect and behavior are appropriate   LABS/RADIOLOGY: Labs reviewed: Basic Metabolic Panel:  Recent Labs  10/20/16 0007 10/20/16 0046 10/20/16 0717 10/21/16 0316  02/11/17 05/05/17 05/11/17  NA 136 138 136 138  < > 138 138 135*  K 4.2 4.2 3.7 4.0  < > 3.9 4.2 3.6  CL 103 101 99* 101  --   --   --   --   CO2 23  --  26 28  --   --   --   --   GLUCOSE 233* 240* 231* 132*  --   --   --   --   BUN 28* 36* 29* 30*  < > 41* 49* 59*  CREATININE 1.56* 1.60* 1.65* 1.73*  < > 1.5* 1.8* 2.0*  CALCIUM 9.1  --  8.8* 8.6*  --   --   --   --   MG 1.8  --   --  2.1  --   --   --   --   < > = values in this interval not displayed. Liver Function Tests:  Recent Labs  10/20/16 0007 10/20/16 0717 12/04/16  AST 73* 41 23  ALT 39 33 28  ALKPHOS 77 64 44  BILITOT 1.0 1.3*  --   PROT 7.2 6.5  --   ALBUMIN 3.5 3.3*  --    CBC:  Recent Labs  10/05/16 0037 10/20/16 0007  10/20/16 0717 10/21/16 0316  12/22/16 12/23/16 01/06/17 02/11/17  WBC 19.6* 21.5*  --  13.9* 9.0  < > 10.6  --  7.6 10.0  NEUTROABS 16.7* 18.6*  --  11.4*  --   --   --   --   --   --   HGB 12.1* 11.1*  < > 9.9* 8.9*  < > 7.7* 7.4* 9.6* 12.0*  HCT 36.6* 33.3*  < > 30.6* 27.1*  < > 23* 23*  23* 32* 37*  MCV 88.2 87.9   --  87.4 87.7  --   --   --   --   --   PLT 214 158  --  181 173  < > 186  --  172 161  < > = values in this interval not displayed. Lipid Panel:  Recent Labs  04/17/17  HDL 51   Cardiac Enzymes:  Recent Labs  10/20/16 0717 10/20/16 1219 10/20/16 1825  TROPONINI 0.03* <0.03 0.04*   CBG:  Recent Labs  10/20/16 2108 10/21/16 0616 10/21/16 1138  GLUCAP 213* 160* 245*    ASSESSMENT/PLAN:  1. Coronary artery disease involving coronary bypass graft of native heart, angina presence unspecified - no complaints of chest pains, continue NTG when necessary and Isosorbide MN ER 30 mg 1 tab PO Q D   2. Chronic diastolic heart failure (HCC) - no SOB; continue Hydralazine 10 mg 1 tab PO BID, Metoprolol tartrate 50 mg 1 tab PO BID and Torsemide 20 mg 2 tabs = 40 mg PO Q D; weigh Q MWF, referred to [name], cardiology   3. Type 2 diabetes mellitus with stage 4 chronic kidney disease (HCC) - continue Lantus 100 units/ml 34 units SQ Q HS and Novolog 100 units/ml give 5 units SQ Q ACHS for CBG >  150; nephrology consult Lab Results  Component Value Date   HGBA1C 9.2 03/22/2017     4. Allergic rhinitis, unspecified seasonality, unspecified trigger - stable; continue fluticasone 50 g 1 spray in each nostril twice a day and Allergy 10 mg 1 tab PO Q D   5. Essential hypertension - well controlled; continue hydralazine 10 mg 1 tab by mouth twice a day, Hydralazine 10 mg 1 tab PO BID and metoprolol tartrate 50 mg 1 tab by mouth twice a day   6. Hypokalemia - continue KCl ER 20 meq 1 tab by mouth daily Lab Results  Component Value Date   K 3.6 05/11/2017     7. Constipation, unspecified constipation type - continue Colace 100 mg 1 capsule PO Q D and Senexon-S 2 tabs PO Q HS   8. Glaucoma of both eyes, unspecified glaucoma type - no complaints of eye pain; continue Latanoprost 0.005% instill 1 gtt in each eye Q evening   9. Dyslipidemia - continue Rosuvastatin 5 mg 1 tab PO  3X/week (MWF) Lab Results  Component Value Date   CHOL 221 (A) 04/17/2017   HDL 51 04/17/2017   LDLCALC 135 04/17/2017   TRIG 174 (A) 04/17/2017   CHOLHDL 2.5 01/01/2010     10. Benign prostatic hyperplasia, unspecified whether lower urinary tract symptoms present - denies difficulty in urinating, continue Flomax 0.4 mg 1 capsule PO Q HS     Goals of care:  Long-term care    Monina C. Toom[place] - NP    [employer] [phone]

## 2017-05-20 ENCOUNTER — Encounter: Payer: Self-pay | Admitting: Adult Health

## 2017-05-20 ENCOUNTER — Non-Acute Institutional Stay (SKILLED_NURSING_FACILITY): Payer: Medicare HMO | Admitting: Adult Health

## 2017-05-20 DIAGNOSIS — R600 Localized edema: Secondary | ICD-10-CM | POA: Diagnosis not present

## 2017-05-20 NOTE — Progress Notes (Signed)
DATE:  05/20/2017   MRN:  [id number]  BIRTHDAY: Mar 18, 1920  Facility:  Nursing Home Location:  [facility] Room Number: 536-R  LEVEL OF CARE:  SNF (31)  Contact Information    Name Relation Home Work Mobile   [place] Denman George [phone]     [place] [phone]         Code Status History    Date Active Date Inactive Code Status Order ID Comments User Context   10/20/2016  6:10 AM 10/21/2016  5:48 PM DNR [id number]  Rise Patience, MD Inpatient    Questions for Most Recent Historical Code Status (Order [id number])    Question Answer Comment   In the event of cardiac or respiratory ARREST Do not call a "code blue"    In the event of cardiac or respiratory ARREST Do not perform Intubation, CPR, defibrillation or ACLS    In the event of cardiac or respiratory ARREST Use medication by any route, position, wound care, and other measures to relive pain and suffering. May use oxygen, suction and manual treatment of airway obstruction as needed for comfort.        Chief Complaint  Patient presents with  . Acute Visit    Bilateral lower extremity edema    HISTORY OF PRESENT ILLNESS:  This is a 73-YO male seen for an acute visit secondary to bilateral lower extremity edema. He was seen in his room today. He reports not being able to use his new orthopedic shoes due to his feet being swollen. He has never worn his new orthopedic shoes @ all. He has BLE edema 2+, no erythema noted. He has PMH of coronary artery disease S/P CABG. Hypertension, diabetes mellitus and hyperlipidemia.   PAST MEDICAL HISTORY:  Past Medical History:  Diagnosis Date  . Actinic keratitis   . CHF (congestive heart failure) ([place])   . Claudication ([place])   . Coronary artery disease    status post CABG, 1999  . Diabetes mellitus   . Diverticulosis   . DJD (degenerative joint disease)   . Hypertension    hypertensive syndrome with dyspnea -[facility], February, 2011 - EF 50% and cardiac bypass grafts all patent - responded to diuretics and blood pressure control - [name]  . Microscopic hematuria   . Onychomycosis   . Prostate nodule   . PSVT (paroxysmal supraventricular tachycardia) ([place])   . Vitamin B12 deficiency   . Vitamin D deficiency      CURRENT MEDICATIONS: Reviewed  Patient's Medications  New Prescriptions   No medications on file  Previous Medications   ACETAMINOPHEN (TYLENOL) 325 MG TABLET    Take 650 mg by mouth every 6 (six) hours as needed for mild pain.   AMINO ACIDS-PROTEIN HYDROLYS (FEEDING SUPPLEMENT, PRO-STAT SUGAR FREE 64,) LIQD    Take 30 mLs by mouth 3 (three) times daily.   AMLODIPINE (NORVASC) 5 MG TABLET    Take 5 mg by mouth daily.   CHOLECALCIFEROL (VITAMIN D) 1000 UNITS TABLET    Take 1,000 Units by mouth daily.   DOCUSATE SODIUM (COLACE) 100 MG CAPSULE    Take 100 mg by mouth daily.   FLUTICASONE (FLONASE) 50 MCG/ACT NASAL SPRAY    Place 1 spray into both nostrils 2 (two) times daily as needed.    HYDRALAZINE (APRESOLINE) 10 MG TABLET    Take 10 mg by mouth 2 (two) times daily.   INSULIN ASPART (NOVOLOG) 100 UNIT/ML  INJECTION    Inject 5 Units into the skin daily as needed for high blood sugar (if blood glucose >150).   INSULIN GLARGINE (LANTUS SOLOSTAR) 100 UNIT/ML SOLOSTAR PEN    Inject 34 Units into the skin at bedtime.    ISOSORBIDE MONONITRATE (IMDUR) 30 MG 24 HR TABLET    Take 30 mg by mouth daily.    LATANOPROST (XALATAN) 0.005 % OPHTHALMIC SOLUTION    Place 1 drop into both eyes at bedtime.    LORATADINE (CLARITIN) 10 MG TABLET    Take 10 mg by mouth daily.    METOPROLOL (LOPRESSOR) 50 MG TABLET    TAKE 1 TABLET (50 MG TOTAL) BY MOUTH 2 (TWO) TIMES DAILY.   MULTIPLE VITAMIN (MULTIVITAMIN) TABLET    Take 1 tablet by mouth daily.   NITROGLYCERIN (NITROSTAT) 0.4 MG SL TABLET    Place 0.4 mg under the tongue every 5 (five) minutes as needed for chest pain. Up to 3 doses    PANTOPRAZOLE (PROTONIX) 40 MG TABLET    Take 40 mg by mouth 2 (two) times daily.   POTASSIUM CHLORIDE (KLOR-CON) 20 MEQ PACKET    Take 20 mEq by mouth daily.   ROSUVASTATIN (CRESTOR) 5 MG TABLET    Take 1 tablet (5 mg total) by mouth 3 (three) times a week. Take 1 tablet Mon, Wed, Fri   SENNOSIDES-DOCUSATE SODIUM (SENOKOT-S) 8.6-50 MG TABLET    Take 2 tablets by mouth at bedtime.   TAMSULOSIN (FLOMAX) 0.4 MG CAPS CAPSULE    Take 0.4 mg by mouth at bedtime.    TORSEMIDE (DEMADEX) 20 MG TABLET    Take 40 mg by mouth daily. Take 2 tablets to = 40 mg BID until weight is 176 lbs and then decrease to 40 mg once a day   VITAMIN B-12 (CYANOCOBALAMIN) 1000 MCG TABLET    Take 1,000 mcg by mouth daily.    VITAMIN C (ASCORBIC ACID) 500 MG TABLET    Take 500 mg by mouth 2 (two) times daily.   Modified Medications   No medications on file  Discontinued Medications   No medications on file     No Known Allergies   REVIEW OF SYSTEMS:  GENERAL: no change in appetite, no fatigue, no weight changes, no fever, chills or weakness EYES: Denies change in vision, dry eyes, eye pain, itching or discharge EARS: Denies change in hearing, ringing in ears, or earache NOSE: Denies nasal congestion or epistaxis MOUTH and THROAT: Denies oral discomfort, gingival pain or bleeding, pain from teeth or hoarseness   RESPIRATORY: no cough, SOB, DOE, wheezing, hemoptysis CARDIAC: no chest pain or palpitations GI: no abdominal pain, diarrhea, constipation, heart burn, nausea or vomiting GU: Denies dysuria, frequency, hematuria, incontinence, or discharge PSYCHIATRIC: Denies feeling of depression or anxiety. No report of hallucinations, insomnia, paranoia, or agitation   PHYSICAL EXAMINATION  GENERAL APPEARANCE: Well nourished. In no acute distress. Normal body habitus SKIN:  Skin is warm and dry. HEAD: Normal in size and contour. No evidence of trauma EYES: Lids open and close normally. No blepharitis, entropion or  ectropion. PERRL. Conjunctivae are clear and sclerae are white. Lenses are without opacity EARS: Pinnae are normal. Patient hears normal voice tunes of the examiner MOUTH and THROAT: Lips are without lesions. Oral mucosa is moist and without lesions. Tongue is normal in shape, size, and color and without lesions NECK: supple, trachea midline, no neck masses, no thyroid tenderness, no thyromegaly LYMPHATICS: no LAN in the neck, no supraclavicular  LAN RESPIRATORY: breathing is even & unlabored, BS CTAB CARDIAC: RRR, no murmur,no extra heart sounds, BLE 2+ edema GI: abdomen soft, normal BS, no masses, no tenderness, no hepatomegaly, no splenomegaly EXTREMITIES:  Able to move X 4 extremities PSYCHIATRIC: Alert and oriented X 3. Affect and behavior are appropriate   LABS/RADIOLOGY: Labs reviewed: Basic Metabolic Panel:  Recent Labs  10/20/16 0007 10/20/16 0046 10/20/16 0717 10/21/16 0316  02/11/17 05/05/17 05/11/17  NA 136 138 136 138  < > 138 138 135*  K 4.2 4.2 3.7 4.0  < > 3.9 4.2 3.6  CL 103 101 99* 101  --   --   --   --   CO2 23  --  26 28  --   --   --   --   GLUCOSE 233* 240* 231* 132*  --   --   --   --   BUN 28* 36* 29* 30*  < > 41* 49* 59*  CREATININE 1.56* 1.60* 1.65* 1.73*  < > 1.5* 1.8* 2.0*  CALCIUM 9.1  --  8.8* 8.6*  --   --   --   --   MG 1.8  --   --  2.1  --   --   --   --   < > = values in this interval not displayed. Liver Function Tests:  Recent Labs  10/20/16 0007 10/20/16 0717 12/04/16  AST 73* 41 23  ALT 39 33 28  ALKPHOS 77 64 44  BILITOT 1.0 1.3*  --   PROT 7.2 6.5  --   ALBUMIN 3.5 3.3*  --    CBC:  Recent Labs  10/05/16 0037 10/20/16 0007  10/20/16 0717 10/21/16 0316  12/22/16 12/23/16 01/06/17 02/11/17  WBC 19.6* 21.5*  --  13.9* 9.0  < > 10.6  --  7.6 10.0  NEUTROABS 16.7* 18.6*  --  11.4*  --   --   --   --   --   --   HGB 12.1* 11.1*  < > 9.9* 8.9*  < > 7.7* 7.4* 9.6* 12.0*  HCT 36.6* 33.3*  < > 30.6* 27.1*  < > 23* 23*  23* 32*  37*  MCV 88.2 87.9  --  87.4 87.7  --   --   --   --   --   PLT 214 158  --  181 173  < > 186  --  172 161  < > = values in this interval not displayed. Lipid Panel:  Recent Labs  04/17/17  HDL 51   Cardiac Enzymes:  Recent Labs  10/20/16 0717 10/20/16 1219 10/20/16 1825  TROPONINI 0.03* <0.03 0.04*   CBG:  Recent Labs  10/20/16 2108 10/21/16 0616 10/21/16 1138  GLUCAP 213* 160* 245*     ASSESSMENT/PLAN:  1. Edema of left lower extremity -  No SOB; Increase torsemide 20 mg give 2 tabs = 40 mg by mouth twice a day and KCl ER 20 meq 1 tab by mouth twice a day; check BMP in 1 week    Airyana Sprunger C. [place] - NP    [employer] [phone]

## 2017-05-25 DIAGNOSIS — I10 Essential (primary) hypertension: Secondary | ICD-10-CM | POA: Diagnosis not present

## 2017-05-25 DIAGNOSIS — D649 Anemia, unspecified: Secondary | ICD-10-CM | POA: Diagnosis not present

## 2017-05-25 LAB — BASIC METABOLIC PANEL
BUN: 78 — AB (ref 4–21)
CREATININE: 2.5 — AB (ref 0.6–1.3)
GLUCOSE: 293
POTASSIUM: 3.7 (ref 3.4–5.3)
Sodium: 134 — AB (ref 137–147)

## 2017-05-27 DIAGNOSIS — D649 Anemia, unspecified: Secondary | ICD-10-CM | POA: Diagnosis not present

## 2017-05-27 DIAGNOSIS — I10 Essential (primary) hypertension: Secondary | ICD-10-CM | POA: Diagnosis not present

## 2017-05-31 DIAGNOSIS — E559 Vitamin D deficiency, unspecified: Secondary | ICD-10-CM | POA: Diagnosis not present

## 2017-05-31 DIAGNOSIS — I10 Essential (primary) hypertension: Secondary | ICD-10-CM | POA: Diagnosis not present

## 2017-05-31 DIAGNOSIS — D649 Anemia, unspecified: Secondary | ICD-10-CM | POA: Diagnosis not present

## 2017-05-31 DIAGNOSIS — E119 Type 2 diabetes mellitus without complications: Secondary | ICD-10-CM | POA: Diagnosis not present

## 2017-05-31 LAB — BASIC METABOLIC PANEL
BUN: 79 — AB (ref 4–21)
CREATININE: 2.3 — AB (ref 0.6–1.3)
Glucose: 194
Potassium: 3.4 (ref 3.4–5.3)
Sodium: 138 (ref 137–147)

## 2017-06-04 DIAGNOSIS — D649 Anemia, unspecified: Secondary | ICD-10-CM | POA: Diagnosis not present

## 2017-06-09 ENCOUNTER — Non-Acute Institutional Stay: Payer: Medicare HMO | Admitting: Adult Health

## 2017-06-09 ENCOUNTER — Encounter: Payer: Self-pay | Admitting: Adult Health

## 2017-06-09 DIAGNOSIS — E1165 Type 2 diabetes mellitus with hyperglycemia: Secondary | ICD-10-CM

## 2017-06-09 DIAGNOSIS — N184 Chronic kidney disease, stage 4 (severe): Secondary | ICD-10-CM | POA: Diagnosis not present

## 2017-06-09 DIAGNOSIS — I5032 Chronic diastolic (congestive) heart failure: Secondary | ICD-10-CM | POA: Diagnosis not present

## 2017-06-09 DIAGNOSIS — E1122 Type 2 diabetes mellitus with diabetic chronic kidney disease: Secondary | ICD-10-CM | POA: Diagnosis not present

## 2017-06-09 DIAGNOSIS — N179 Acute kidney failure, unspecified: Secondary | ICD-10-CM

## 2017-06-09 DIAGNOSIS — IMO0002 Reserved for concepts with insufficient information to code with codable children: Secondary | ICD-10-CM

## 2017-06-09 DIAGNOSIS — Z794 Long term (current) use of insulin: Secondary | ICD-10-CM

## 2017-06-09 DIAGNOSIS — N189 Chronic kidney disease, unspecified: Secondary | ICD-10-CM | POA: Diagnosis not present

## 2017-06-09 DIAGNOSIS — D649 Anemia, unspecified: Secondary | ICD-10-CM | POA: Diagnosis not present

## 2017-06-09 DIAGNOSIS — I10 Essential (primary) hypertension: Secondary | ICD-10-CM | POA: Diagnosis not present

## 2017-06-09 LAB — BASIC METABOLIC PANEL
BUN: 86 — AB (ref 4–21)
CREATININE: 2.6 — AB (ref 0.6–1.3)
GLUCOSE: 170
Potassium: 3.4 (ref 3.4–5.3)
Sodium: 136 — AB (ref 137–147)

## 2017-06-09 NOTE — Telephone Encounter (Signed)
This encounter was created in error - please disregard.

## 2017-06-09 NOTE — Progress Notes (Signed)
DATE:  06/09/2017   MRN:  [id number]  BIRTHDAY: Apr 19, 1920  Facility:  Nursing Home Location:  [facility] Room Number: 893-T  LEVEL OF CARE:  SNF (31)  Contact Information    Name Relation Home Work Mobile   [place] Denman George [phone]     [place] [phone]         Code Status History    Date Active Date Inactive Code Status Order ID Comments User Context   10/20/2016  6:10 AM 10/21/2016  5:48 PM DNR [id number]  Rise Patience, MD Inpatient    Questions for Most Recent Historical Code Status (Order [id number])    Question Answer Comment   In the event of cardiac or respiratory ARREST Do not call a "code blue"    In the event of cardiac or respiratory ARREST Do not perform Intubation, CPR, defibrillation or ACLS    In the event of cardiac or respiratory ARREST Use medication by any route, position, wound care, and other measures to relive pain and suffering. May use oxygen, suction and manual treatment of airway obstruction as needed for comfort.        Chief Complaint  Patient presents with  . Acute Visit    Elevated creatinine, elevated blood glucoses secondary to dietary noncompliance    HISTORY OF PRESENT ILLNESS:  This is a 38-YO male seen for acute visit secondary to elevated creatinine and elevated blood glucoses secondary.  He is a long-term care resident at [facility].  He has a PMH of coronary artery disease S/P CABG,hypertension, diabetes mellitus and hyperlipidemia. He was seen in the room today. Latest creatinine 2.63, BUN 86.4 and GFR 19.83 . CBG @ lunch was high. He has been noncompliant with his diet. Nephew was in the room and noted to have brought in snacks consisting of several peanut butter crackers. Updated nephew regarding CBGs and rising creatinine. He is currently on Torsemide 40 mg BID for Chronic diastolic CHF . Latest weight 182.6 lbs  from 188 lbs 5 days ago. No reported SOB. He  has PMH of coronary artery disease S/P CABG.Hypertension, diabetes mellitus and hyperlipidemia.    PAST MEDICAL HISTORY:  Past Medical History:  Diagnosis Date  . Actinic keratitis   . CHF (congestive heart failure) ([place])   . Claudication ([place])   . Coronary artery disease    status post CABG, 1999  . Diabetes mellitus    Uncontrolled secondary to dietary noncompliance  . Diverticulosis   . DJD (degenerative joint disease)   . Hypertension    hypertensive syndrome with dyspnea -[facility], February, 2011 - EF 50% and cardiac bypass grafts all patent - responded to diuretics and blood pressure control - [name]  . Microscopic hematuria   . Onychomycosis   . Prostate nodule   . PSVT (paroxysmal supraventricular tachycardia) ([place])   . Vitamin B12 deficiency   . Vitamin D deficiency      CURRENT MEDICATIONS: Reviewed  Patient's Medications  New Prescriptions   No medications on file  Previous Medications   ACETAMINOPHEN (TYLENOL) 325 MG TABLET    Take 650 mg by mouth every 6 (six) hours as needed for mild pain.   AMINO ACIDS-PROTEIN HYDROLYS (FEEDING SUPPLEMENT, PRO-STAT SUGAR FREE 64,) LIQD    Take 30 mLs by mouth 3 (three) times daily.   AMLODIPINE (NORVASC) 5 MG TABLET    Take 5 mg by mouth daily.   CHOLECALCIFEROL (VITAMIN D)  1000 UNITS TABLET    Take 1,000 Units by mouth daily.   DOCUSATE SODIUM (COLACE) 100 MG CAPSULE    Take 100 mg by mouth daily.   FLUTICASONE (FLONASE) 50 MCG/ACT NASAL SPRAY    Place 1 spray into both nostrils 2 (two) times daily as needed.    HYDRALAZINE (APRESOLINE) 10 MG TABLET    Take 10 mg by mouth 2 (two) times daily.   INSULIN ASPART (NOVOLOG) 100 UNIT/ML INJECTION    Inject 8 Units into the skin daily as needed for high blood sugar (if blood glucose >150).    INSULIN GLARGINE (LANTUS SOLOSTAR) 100 UNIT/ML SOLOSTAR PEN    Inject 40 Units into the skin at bedtime.    ISOSORBIDE MONONITRATE (IMDUR) 30 MG 24 HR TABLET     Take 30 mg by mouth daily.    LATANOPROST (XALATAN) 0.005 % OPHTHALMIC SOLUTION    Place 1 drop into both eyes at bedtime.    LORATADINE (CLARITIN) 10 MG TABLET    Take 10 mg by mouth daily.    METOPROLOL (LOPRESSOR) 50 MG TABLET    TAKE 1 TABLET (50 MG TOTAL) BY MOUTH 2 (TWO) TIMES DAILY.   MULTIPLE VITAMIN (MULTIVITAMIN) TABLET    Take 1 tablet by mouth daily.   NITROGLYCERIN (NITROSTAT) 0.4 MG SL TABLET    Place 0.4 mg under the tongue every 5 (five) minutes as needed for chest pain. Up to 3 doses   PANTOPRAZOLE (PROTONIX) 40 MG TABLET    Take 40 mg by mouth 2 (two) times daily.   POTASSIUM CHLORIDE (KLOR-CON) 20 MEQ PACKET    Take 20 mEq by mouth 2 (two) times daily.    ROSUVASTATIN (CRESTOR) 5 MG TABLET    Take 1 tablet (5 mg total) by mouth 3 (three) times a week. Take 1 tablet Mon, Wed, Fri   SENNOSIDES-DOCUSATE SODIUM (SENOKOT-S) 8.6-50 MG TABLET    Take 2 tablets by mouth at bedtime.   TAMSULOSIN (FLOMAX) 0.4 MG CAPS CAPSULE    Take 0.4 mg by mouth at bedtime.    TORSEMIDE (DEMADEX) 20 MG TABLET    Take 40 mg by mouth daily. Take 2 tablets to = 40 mg BID until weight is 176 lbs and then decrease to 40 mg once a day   VITAMIN B-12 (CYANOCOBALAMIN) 1000 MCG TABLET    Take 1,000 mcg by mouth daily.    VITAMIN C (ASCORBIC ACID) 500 MG TABLET    Take 500 mg by mouth 2 (two) times daily.   Modified Medications   No medications on file  Discontinued Medications   No medications on file     No Known Allergies   REVIEW OF SYSTEMS:  GENERAL: no change in appetite, no fatigue, no weight changes, no fever, chills or weakness EYES: Denies change in vision, dry eyes, eye pain, itching or discharge EARS: Denies change in hearing, ringing in ears, or earache NOSE: Denies nasal congestion or epistaxis MOUTH and THROAT: Denies oral discomfort, gingival pain or bleeding, pain from teeth or hoarseness   RESPIRATORY: no cough, SOB, DOE, wheezing, hemoptysis CARDIAC: no chest pain or  palpitations GI: no abdominal pain, diarrhea, constipation, heart burn, nausea or vomiting GU: Denies dysuria, frequency, hematuria, incontinence, or discharge PSYCHIATRIC: Denies feeling of depression or anxiety. No report of hallucinations, insomnia, paranoia, or agitation   PHYSICAL EXAMINATION  GENERAL APPEARANCE: Well nourished. In no acute distress. Normal body habitus SKIN:  Skin is warm and dry.  HEAD: Normal in size and  contour. No evidence of trauma EYES: Lids open and close normally. No blepharitis, entropion or ectropion. PERRL. Conjunctivae are clear and sclerae are white. Lenses are without opacity EARS: Pinnae are normal. Patient hears normal voice tunes of the examiner MOUTH and THROAT: Lips are without lesions. Oral mucosa is moist and without lesions. Tongue is normal in shape, size, and color and without lesions NECK: supple, trachea midline, no neck masses, no thyroid tenderness, no thyromegaly LYMPHATICS: no LAN in the neck, no supraclavicular LAN RESPIRATORY: breathing is even & unlabored, BS CTAB CARDIAC: RRR, no murmur,no extra heart sounds, BLE 1+ edema GI: abdomen soft, normal BS, no masses, no tenderness, no hepatomegaly, no splenomegaly EXTREMITIES:  Able to move X 4 extremities PSYCHIATRIC: Alert and oriented X 3. Affect and behavior are appropriate    LABS/RADIOLOGY: Labs reviewed: Basic Metabolic Panel:  Recent Labs  10/20/16 0007 10/20/16 0046 10/20/16 0717 10/21/16 0316  05/25/17 05/31/17 06/09/17  NA 136 138 136 138  < > 134* 138 136*  K 4.2 4.2 3.7 4.0  < > 3.7 3.4 3.4  CL 103 101 99* 101  --   --   --   --   CO2 23  --  26 28  --   --   --   --   GLUCOSE 233* 240* 231* 132*  --   --   --   --   BUN 28* 36* 29* 30*  < > 78* 79* 86*  CREATININE 1.56* 1.60* 1.65* 1.73*  < > 2.5* 2.3* 2.6*  CALCIUM 9.1  --  8.8* 8.6*  --   --   --   --   MG 1.8  --   --  2.1  --   --   --   --   < > = values in this interval not displayed. Liver Function  Tests:  Recent Labs  10/20/16 0007 10/20/16 0717 12/04/16  AST 73* 41 23  ALT 39 33 28  ALKPHOS 77 64 44  BILITOT 1.0 1.3*  --   PROT 7.2 6.5  --   ALBUMIN 3.5 3.3*  --    CBC:  Recent Labs  10/05/16 0037 10/20/16 0007  10/20/16 0717 10/21/16 0316  12/22/16 12/23/16 01/06/17 02/11/17  WBC 19.6* 21.5*  --  13.9* 9.0  < > 10.6  --  7.6 10.0  NEUTROABS 16.7* 18.6*  --  11.4*  --   --   --   --   --   --   HGB 12.1* 11.1*  < > 9.9* 8.9*  < > 7.7* 7.4* 9.6* 12.0*  HCT 36.6* 33.3*  < > 30.6* 27.1*  < > 23* 23*  23* 32* 37*  MCV 88.2 87.9  --  87.4 87.7  --   --   --   --   --   PLT 214 158  --  181 173  < > 186  --  172 161  < > = values in this interval not displayed. Lipid Panel:  Recent Labs  04/17/17  HDL 51   Cardiac Enzymes:  Recent Labs  10/20/16 0717 10/20/16 1219 10/20/16 1825  TROPONINI 0.03* <0.03 0.04*   CBG:  Recent Labs  10/20/16 2108 10/21/16 0616 10/21/16 1138  GLUCAP 213* 160* 245*    ASSESSMENT/PLAN:  1. Acute kidney injury superimposed on chronic kidney disease (HCC) - will decrease Torsemide from 40 mg BID to Q D and KCL ER 20 meq 1 tab PO Q D,  check BMP in 1 week; referred to nephrology Lab Results  Component Value Date   CREATININE 2.6 (A) 06/09/2017    2. Chronic diastolic heart failure (HCC) - no SOB, latest weight 182.6 lbs, down by 5.4 lbs from 5 days ago, will decrease Torsemide to 40 mg PO Q D and continue Metoprolol tartrate 50 mg 1 tab PO BID   3. Uncontrolled type 2 diabetes mellitus with stage 4 chronic kidney disease, with long-term current use of insulin ([place]) -due to noncompliance with diet, updated nephew about elevated CBGs and creatinine, nephew agreed not to bring peanut butter crackers,  will add Novolog 100 units/ml inject 12 units SQ TIDac, continue Novolog 8 units SQ ACHS and Lantus 40 units SQ Q HS; referred to endocrinology Lab Results  Component Value Date   HGBA1C 9.2 03/22/2017       Dymon Summerhill C.  [place] - NP    [employer] [phone]

## 2017-06-11 ENCOUNTER — Emergency Department (HOSPITAL_COMMUNITY): Payer: Medicare HMO

## 2017-06-11 ENCOUNTER — Encounter (HOSPITAL_COMMUNITY): Payer: Self-pay

## 2017-06-11 ENCOUNTER — Telehealth: Payer: Self-pay | Admitting: Internal Medicine

## 2017-06-11 ENCOUNTER — Non-Acute Institutional Stay: Payer: Medicare HMO | Admitting: Adult Health

## 2017-06-11 ENCOUNTER — Encounter: Payer: Self-pay | Admitting: Adult Health

## 2017-06-11 ENCOUNTER — Observation Stay (HOSPITAL_COMMUNITY)
Admission: EM | Admit: 2017-06-11 | Discharge: 2017-06-13 | Disposition: A | Discharge status: Home / Self Care | Payer: Medicare HMO | Attending: Nephrology | Admitting: Nephrology

## 2017-06-11 DIAGNOSIS — N189 Chronic kidney disease, unspecified: Secondary | ICD-10-CM | POA: Diagnosis not present

## 2017-06-11 DIAGNOSIS — I5032 Chronic diastolic (congestive) heart failure: Secondary | ICD-10-CM | POA: Diagnosis not present

## 2017-06-11 DIAGNOSIS — E0821 Diabetes mellitus due to underlying condition with diabetic nephropathy: Secondary | ICD-10-CM | POA: Diagnosis not present

## 2017-06-11 DIAGNOSIS — N179 Acute kidney failure, unspecified: Principal | ICD-10-CM | POA: Diagnosis present

## 2017-06-11 DIAGNOSIS — I25709 Atherosclerosis of coronary artery bypass graft(s), unspecified, with unspecified angina pectoris: Secondary | ICD-10-CM | POA: Diagnosis not present

## 2017-06-11 DIAGNOSIS — I471 Supraventricular tachycardia, unspecified: Secondary | ICD-10-CM | POA: Diagnosis present

## 2017-06-11 DIAGNOSIS — R062 Wheezing: Secondary | ICD-10-CM | POA: Diagnosis not present

## 2017-06-11 DIAGNOSIS — E871 Hypo-osmolality and hyponatremia: Secondary | ICD-10-CM | POA: Diagnosis not present

## 2017-06-11 DIAGNOSIS — Z87891 Personal history of nicotine dependence: Secondary | ICD-10-CM | POA: Insufficient documentation

## 2017-06-11 DIAGNOSIS — N184 Chronic kidney disease, stage 4 (severe): Secondary | ICD-10-CM

## 2017-06-11 DIAGNOSIS — I130 Hypertensive heart and chronic kidney disease with heart failure and stage 1 through stage 4 chronic kidney disease, or unspecified chronic kidney disease: Secondary | ICD-10-CM | POA: Diagnosis not present

## 2017-06-11 DIAGNOSIS — Z7984 Long term (current) use of oral hypoglycemic drugs: Secondary | ICD-10-CM | POA: Diagnosis not present

## 2017-06-11 DIAGNOSIS — K219 Gastro-esophageal reflux disease without esophagitis: Secondary | ICD-10-CM | POA: Diagnosis not present

## 2017-06-11 DIAGNOSIS — I2510 Atherosclerotic heart disease of native coronary artery without angina pectoris: Secondary | ICD-10-CM | POA: Insufficient documentation

## 2017-06-11 DIAGNOSIS — R05 Cough: Secondary | ICD-10-CM | POA: Diagnosis not present

## 2017-06-11 DIAGNOSIS — E7800 Pure hypercholesterolemia, unspecified: Secondary | ICD-10-CM | POA: Diagnosis not present

## 2017-06-11 DIAGNOSIS — D649 Anemia, unspecified: Secondary | ICD-10-CM | POA: Diagnosis not present

## 2017-06-11 DIAGNOSIS — N183 Chronic kidney disease, stage 3 unspecified: Secondary | ICD-10-CM | POA: Diagnosis present

## 2017-06-11 DIAGNOSIS — N400 Enlarged prostate without lower urinary tract symptoms: Secondary | ICD-10-CM | POA: Diagnosis present

## 2017-06-11 DIAGNOSIS — E1122 Type 2 diabetes mellitus with diabetic chronic kidney disease: Secondary | ICD-10-CM | POA: Diagnosis not present

## 2017-06-11 DIAGNOSIS — E119 Type 2 diabetes mellitus without complications: Secondary | ICD-10-CM | POA: Diagnosis not present

## 2017-06-11 DIAGNOSIS — Z79899 Other long term (current) drug therapy: Secondary | ICD-10-CM | POA: Insufficient documentation

## 2017-06-11 DIAGNOSIS — J189 Pneumonia, unspecified organism: Secondary | ICD-10-CM

## 2017-06-11 DIAGNOSIS — R69 Illness, unspecified: Secondary | ICD-10-CM | POA: Diagnosis not present

## 2017-06-11 DIAGNOSIS — R0602 Shortness of breath: Secondary | ICD-10-CM | POA: Diagnosis not present

## 2017-06-11 DIAGNOSIS — I5042 Chronic combined systolic (congestive) and diastolic (congestive) heart failure: Secondary | ICD-10-CM | POA: Diagnosis present

## 2017-06-11 DIAGNOSIS — R944 Abnormal results of kidney function studies: Secondary | ICD-10-CM | POA: Diagnosis present

## 2017-06-11 HISTORY — DX: Disorder of kidney and ureter, unspecified: N28.9

## 2017-06-11 LAB — COMPREHENSIVE METABOLIC PANEL
ALT: 11 U/L — ABNORMAL LOW (ref 17–63)
AST: 17 U/L (ref 15–41)
Albumin: 3.6 g/dL (ref 3.5–5.0)
Alkaline Phosphatase: 48 U/L (ref 38–126)
Anion gap: 12 (ref 5–15)
BUN: 127 mg/dL — ABNORMAL HIGH (ref 6–20)
CO2: 22 mmol/L (ref 22–32)
Calcium: 8.9 mg/dL (ref 8.9–10.3)
Chloride: 93 mmol/L — ABNORMAL LOW (ref 101–111)
Creatinine, Ser: 4.14 mg/dL — ABNORMAL HIGH (ref 0.61–1.24)
GFR calc Af Amer: 13 mL/min — ABNORMAL LOW (ref >60)
GFR calc non Af Amer: 11 mL/min — ABNORMAL LOW (ref >60)
Glucose, Bld: 275 mg/dL — ABNORMAL HIGH (ref 65–99)
Potassium: 5 mmol/L (ref 3.5–5.1)
Sodium: 127 mmol/L — ABNORMAL LOW (ref 135–145)
Total Bilirubin: 0.7 mg/dL (ref 0.3–1.2)
Total Protein: 7 g/dL (ref 6.5–8.1)

## 2017-06-11 LAB — CBC WITH DIFFERENTIAL/PLATELET
Basophils Absolute: 0 10*3/uL (ref 0.0–0.1)
Basophils Relative: 0 %
EOS ABS: 0.1 10*3/uL (ref 0.0–0.7)
Eosinophils Relative: 1 %
HCT: 33.7 % — ABNORMAL LOW (ref 39.0–52.0)
HEMOGLOBIN: 11.4 g/dL — AB (ref 13.0–17.0)
LYMPHS ABS: 1.7 10*3/uL (ref 0.7–4.0)
LYMPHS PCT: 19 %
MCH: 29.3 pg (ref 26.0–34.0)
MCHC: 33.8 g/dL (ref 30.0–36.0)
MCV: 86.6 fL (ref 78.0–100.0)
MONOS PCT: 9 %
Monocytes Absolute: 0.8 10*3/uL (ref 0.1–1.0)
NEUTROS PCT: 71 %
Neutro Abs: 6.3 10*3/uL (ref 1.7–7.7)
Platelets: 183 10*3/uL (ref 150–400)
RBC: 3.89 MIL/uL — AB (ref 4.22–5.81)
RDW: 14.9 % (ref 11.5–15.5)
WBC: 8.9 10*3/uL (ref 4.0–10.5)

## 2017-06-11 LAB — I-STAT CG4 LACTIC ACID, ED: Lactic Acid, Venous: 1.22 mmol/L (ref 0.5–1.9)

## 2017-06-11 LAB — GLUCOSE, CAPILLARY: GLUCOSE-CAPILLARY: 201 mg/dL — AB (ref 65–99)

## 2017-06-11 LAB — I-STAT TROPONIN, ED: Troponin i, poc: 0.02 ng/mL (ref 0.00–0.08)

## 2017-06-11 LAB — BRAIN NATRIURETIC PEPTIDE: B NATRIURETIC PEPTIDE 5: 452.4 pg/mL — AB (ref 0.0–100.0)

## 2017-06-11 LAB — MAGNESIUM: Magnesium: 1.7 mg/dL (ref 1.7–2.4)

## 2017-06-11 MED ORDER — NITROGLYCERIN 0.4 MG SL SUBL: 0.4000 mg | SUBLINGUAL_TABLET | SUBLINGUAL | Status: DC | PRN

## 2017-06-11 MED ORDER — SODIUM CHLORIDE 0.9 % IV SOLN
INTRAVENOUS | Status: DC
  Administered 2017-06-11 – 2017-06-12 (×3): via INTRAVENOUS

## 2017-06-11 MED ORDER — IPRATROPIUM-ALBUTEROL 0.5-2.5 (3) MG/3ML IN SOLN: 3.0000 mL | Freq: Four times a day (QID) | RESPIRATORY_TRACT | Status: DC | PRN

## 2017-06-11 MED ORDER — SENNOSIDES-DOCUSATE SODIUM 8.6-50 MG PO TABS
2.0000 | ORAL_TABLET | Freq: Every day | ORAL | Status: DC
  Administered 2017-06-12 (×2): 2 via ORAL
  Filled 2017-06-11 (×2): qty 2

## 2017-06-11 MED ORDER — INSULIN GLARGINE 100 UNIT/ML ~~LOC~~ SOLN
30.0000 [IU] | Freq: Every day | SUBCUTANEOUS | Status: DC
  Administered 2017-06-12 (×2): 30 [IU] via SUBCUTANEOUS
  Filled 2017-06-11 (×4): qty 0.3

## 2017-06-11 MED ORDER — VITAMIN B-12 1000 MCG PO TABS
1000.0000 ug | ORAL_TABLET | Freq: Every day | ORAL | Status: DC
  Administered 2017-06-12 – 2017-06-13 (×2): 1000 ug via ORAL
  Filled 2017-06-11 (×2): qty 1

## 2017-06-11 MED ORDER — ONDANSETRON HCL 4 MG PO TABS: 4.0000 mg | ORAL_TABLET | Freq: Four times a day (QID) | ORAL | Status: DC | PRN

## 2017-06-11 MED ORDER — ADULT MULTIVITAMIN W/MINERALS CH
1.0000 | ORAL_TABLET | Freq: Every day | ORAL | Status: DC
  Administered 2017-06-12 – 2017-06-13 (×2): 1 via ORAL
  Filled 2017-06-11 (×2): qty 1

## 2017-06-11 MED ORDER — ZOLPIDEM TARTRATE 5 MG PO TABS: 5.0000 mg | ORAL_TABLET | Freq: Every evening | ORAL | Status: DC | PRN

## 2017-06-11 MED ORDER — VITAMIN D 1000 UNITS PO TABS
1000.0000 [IU] | ORAL_TABLET | Freq: Every day | ORAL | Status: DC
  Administered 2017-06-12 – 2017-06-13 (×2): 1000 [IU] via ORAL
  Filled 2017-06-11 (×2): qty 1

## 2017-06-11 MED ORDER — ISOSORBIDE MONONITRATE ER 30 MG PO TB24
30.0000 mg | ORAL_TABLET | Freq: Every day | ORAL | Status: DC
  Administered 2017-06-12 – 2017-06-13 (×2): 30 mg via ORAL
  Filled 2017-06-11 (×2): qty 1

## 2017-06-11 MED ORDER — MAGNESIUM HYDROXIDE 400 MG/5ML PO SUSP: 30.0000 mL | Freq: Once | ORAL | Status: DC | PRN

## 2017-06-11 MED ORDER — ONDANSETRON HCL 4 MG/2ML IJ SOLN: 4.0000 mg | Freq: Four times a day (QID) | INTRAMUSCULAR | Status: DC | PRN

## 2017-06-11 MED ORDER — ACETAMINOPHEN 650 MG RE SUPP: 650.0000 mg | Freq: Four times a day (QID) | RECTAL | Status: DC | PRN

## 2017-06-11 MED ORDER — BISACODYL 10 MG RE SUPP: 10.0000 mg | Freq: Once | RECTAL | Status: DC | PRN

## 2017-06-11 MED ORDER — AMLODIPINE BESYLATE 5 MG PO TABS
5.0000 mg | ORAL_TABLET | Freq: Every day | ORAL | Status: DC
  Administered 2017-06-12 – 2017-06-13 (×2): 5 mg via ORAL
  Filled 2017-06-11 (×2): qty 1

## 2017-06-11 MED ORDER — ENOXAPARIN SODIUM 40 MG/0.4ML ~~LOC~~ SOLN: 40.0000 mg | SUBCUTANEOUS | Status: DC

## 2017-06-11 MED ORDER — HYDRALAZINE HCL 20 MG/ML IJ SOLN: 5.0000 mg | INTRAMUSCULAR | Status: DC | PRN

## 2017-06-11 MED ORDER — ROSUVASTATIN CALCIUM 10 MG PO TABS: 5.0000 mg | ORAL_TABLET | ORAL | Status: DC

## 2017-06-11 MED ORDER — METOPROLOL TARTRATE 50 MG PO TABS
50.0000 mg | ORAL_TABLET | Freq: Two times a day (BID) | ORAL | Status: DC
Start: 2017-06-11 — End: 2017-06-13
  Administered 2017-06-12 – 2017-06-13 (×4): 50 mg via ORAL
  Filled 2017-06-11 (×4): qty 1

## 2017-06-11 MED ORDER — FLEET ENEMA 7-19 GM/118ML RE ENEM: 1.0000 | ENEMA | Freq: Once | RECTAL | Status: DC | PRN

## 2017-06-11 MED ORDER — LATANOPROST 0.005 % OP SOLN
1.0000 [drp] | Freq: Every evening | OPHTHALMIC | Status: DC
  Administered 2017-06-12 (×2): 1 [drp] via OPHTHALMIC
  Filled 2017-06-11: qty 2.5

## 2017-06-11 MED ORDER — LORATADINE 10 MG PO TABS
10.0000 mg | ORAL_TABLET | Freq: Every day | ORAL | Status: DC
  Administered 2017-06-12 – 2017-06-13 (×2): 10 mg via ORAL
  Filled 2017-06-11 (×2): qty 1

## 2017-06-11 MED ORDER — INSULIN ASPART 100 UNIT/ML ~~LOC~~ SOLN
0.0000 [IU] | Freq: Three times a day (TID) | SUBCUTANEOUS | Status: DC
  Administered 2017-06-12: 2 [IU] via SUBCUTANEOUS
  Administered 2017-06-12: 1 [IU] via SUBCUTANEOUS
  Administered 2017-06-12 – 2017-06-13 (×2): 2 [IU] via SUBCUTANEOUS

## 2017-06-11 MED ORDER — FLUTICASONE PROPIONATE 50 MCG/ACT NA SUSP
1.0000 | Freq: Two times a day (BID) | NASAL | Status: DC
  Administered 2017-06-12 – 2017-06-13 (×4): 1 via NASAL
  Filled 2017-06-11: qty 16

## 2017-06-11 MED ORDER — TAMSULOSIN HCL 0.4 MG PO CAPS
0.4000 mg | ORAL_CAPSULE | Freq: Every day | ORAL | Status: DC
  Administered 2017-06-12: 0.4 mg via ORAL
  Filled 2017-06-11: qty 1

## 2017-06-11 MED ORDER — HYDRALAZINE HCL 10 MG PO TABS
10.0000 mg | ORAL_TABLET | Freq: Two times a day (BID) | ORAL | Status: DC
  Administered 2017-06-12 – 2017-06-13 (×4): 10 mg via ORAL
  Filled 2017-06-11 (×5): qty 1

## 2017-06-11 MED ORDER — VITAMIN C 500 MG PO TABS
500.0000 mg | ORAL_TABLET | Freq: Two times a day (BID) | ORAL | Status: DC
  Administered 2017-06-12 – 2017-06-13 (×3): 500 mg via ORAL
  Filled 2017-06-11 (×3): qty 1

## 2017-06-11 MED ORDER — ACETAMINOPHEN 325 MG PO TABS: 650.0000 mg | ORAL_TABLET | Freq: Four times a day (QID) | ORAL | Status: DC | PRN

## 2017-06-11 MED ORDER — PRO-STAT SUGAR FREE PO LIQD
30.0000 mL | Freq: Three times a day (TID) | ORAL | Status: DC
  Administered 2017-06-12 – 2017-06-13 (×5): 30 mL via ORAL
  Filled 2017-06-11 (×5): qty 30

## 2017-06-11 MED ORDER — HEPARIN SODIUM (PORCINE) 5000 UNIT/ML IJ SOLN
5000.0000 [IU] | Freq: Three times a day (TID) | INTRAMUSCULAR | Status: DC
  Administered 2017-06-12 – 2017-06-13 (×3): 5000 [IU] via SUBCUTANEOUS
  Filled 2017-06-11 (×3): qty 1

## 2017-06-11 MED ORDER — PANTOPRAZOLE SODIUM 40 MG PO TBEC
40.0000 mg | DELAYED_RELEASE_TABLET | Freq: Two times a day (BID) | ORAL | Status: DC
  Administered 2017-06-12 – 2017-06-13 (×3): 40 mg via ORAL
  Filled 2017-06-11 (×4): qty 1

## 2017-06-11 MED ORDER — DOCUSATE SODIUM 100 MG PO CAPS
100.0000 mg | ORAL_CAPSULE | Freq: Every day | ORAL | Status: DC
  Administered 2017-06-12 – 2017-06-13 (×2): 100 mg via ORAL
  Filled 2017-06-11 (×2): qty 1

## 2017-06-11 NOTE — H&P (Addendum)
History and Physical    ORMAND [name] RWE:[id number] DOB: 04-Jun-1920 DOA: 06/11/2017  Referring MD/NP/PA:   PCP: [name], MD   Patient coming from:  The patient is coming from SNF.  At baseline, pt is dependent for most of ADL.  Chief Complaint: Abnormal lab  HPI: Cole [name] is a 83 y.o. male with medical history significant of hypertension, hyperlipidemia, diabetes mellitus, GERD, PSVT, chronic combined systolic and diastolic CHF, BPH, CAD, s/p of CABG, CKD-3, who presents with abnormal lab.  The patient reportedly has known chronic diastolic heart failure. He has recently been gaining weight at his facility. His PCP increased the dose of his torsemide. However, over the last several visits, he has had increasing creatinine so he recently decreased the dosage of torsemide. However, the patient reportedly had recent lab work drawn and has significant acute on chronic kidney injury, therefore was sent to ED for further evaluation and treatment. Patient is asymptomatic. No symptoms of UTI. He denies chest pain, shortness breath, cough, fever, chills, nausea, vomiting, diarrhea, abdominal pain, unilateral weakness.  ED Course: pt was found to have creatinine 4.14 (baseline creatinine 2.0-2.5), sodium 129, BUN 127, WBC 8.8, BNP 452, pending urinalysis, temperature normal, bradycardia, oxygen saturation 92-98% on room air, negative chest x-ray. Patient isn't placed on telemetry bed for observation.  Review of Systems:   General: no fevers, chills, no changes in body weight HEENT: no blurry vision, hearing changes or sore throat Respiratory: no dyspnea, coughing, wheezing CV: no chest pain, no palpitations GI: no nausea, vomiting, abdominal pain, diarrhea, constipation GU: no dysuria, burning on urination, increased urinary frequency, hematuria  Ext: has mild leg edema Neuro: no unilateral weakness, numbness, or tingling, no vision change or hearing loss Skin: no rash, no skin  tear. MSK: No muscle spasm, no deformity, no limitation of range of movement in spin Heme: No easy bruising.  Travel history: No recent long distant travel.  Allergy: No Known Allergies  Past Medical History:  Diagnosis Date  . Actinic keratitis   . CHF (congestive heart failure) ([place])   . Claudication ([place])   . Coronary artery disease    status post CABG, 1999  . Diabetes mellitus    Uncontrolled secondary to dietary noncompliance  . Diverticulosis   . DJD (degenerative joint disease)   . Hypertension    hypertensive syndrome with dyspnea -Cole [name], February, 2011 - EF 50% and cardiac bypass grafts all patent - responded to diuretics and blood pressure control - [name]  . Microscopic hematuria   . Onychomycosis   . Pneumonia 05/2017  . Prostate nodule   . PSVT (paroxysmal supraventricular tachycardia) ([place])   . Renal disorder   . Vitamin B12 deficiency   . Vitamin D deficiency     Past Surgical History:  Procedure Laterality Date  . APPENDECTOMY    . CORONARY ARTERY BYPASS GRAFT      Social History:  reports that he has quit smoking. His smoking use included Cigars. He has never used smokeless tobacco. He reports that he does not drink alcohol or use drugs.  Family History:  Family History  Problem Relation Age of Onset  . Asthma Neg Hx   . COPD Neg Hx      Prior to Admission medications   Medication Sig Start Date End Date Taking? Authorizing Provider  acetaminophen (TYLENOL) 325 MG tablet Take 650 mg by mouth every 6 (six) hours as needed for mild pain.  [provider]  Amino Acids-Protein Hydrolys (FEEDING SUPPLEMENT, PRO-STAT SUGAR FREE 64,) LIQD Take 30 mLs by mouth 3 (three) times daily.    [provider]  amLODipine (NORVASC) 5 MG tablet Take 5 mg by mouth daily.    [provider]  cholecalciferol (VITAMIN D) 1000 UNITS tablet Take 1,000 Units by mouth daily.    [provider]  docusate  sodium (COLACE) 100 MG capsule Take 100 mg by mouth daily.    [provider]  fluticasone (FLONASE) 50 MCG/ACT nasal spray Place 1 spray into both nostrils 2 (two) times daily as needed.     [provider]  hydrALAZINE (APRESOLINE) 10 MG tablet Take 10 mg by mouth 2 (two) times daily.    [provider]  insulin aspart (NOVOLOG) 100 UNIT/ML injection Inject 8 Units into the skin daily as needed for high blood sugar (if blood glucose >150).     [provider]  Insulin Glargine (LANTUS SOLOSTAR) 100 UNIT/ML Solostar Pen Inject 40 Units into the skin at bedtime.  03/24/17   [provider]  isosorbide mononitrate (IMDUR) 30 MG 24 hr tablet Take 30 mg by mouth daily.     [provider]  latanoprost (XALATAN) 0.005 % ophthalmic solution Place 1 drop into both eyes at bedtime.     [provider]  loratadine (CLARITIN) 10 MG tablet Take 10 mg by mouth daily.     [provider]  metoprolol (LOPRESSOR) 50 MG tablet TAKE 1 TABLET (50 MG TOTAL) BY MOUTH 2 (TWO) TIMES DAILY. 08/12/15   Cole Crome, MD  Multiple Vitamin (MULTIVITAMIN) tablet Take 1 tablet by mouth daily.    [provider]  nitroGLYCERIN (NITROSTAT) 0.4 MG SL tablet Place 0.4 mg under the tongue every 5 (five) minutes as needed for chest pain. Up to 3 doses    [provider]  pantoprazole (PROTONIX) 40 MG tablet Take 40 mg by mouth 2 (two) times daily.    [provider]  potassium chloride (KLOR-CON) 20 MEQ packet Take 20 mEq by mouth daily.     [provider]  rosuvastatin (CRESTOR) 5 MG tablet Take 1 tablet (5 mg total) by mouth 3 (three) times a week. Take 1 tablet Mon, Wed, Fri 12/21/13   Cole Crome, MD  sennosides-docusate sodium (SENOKOT-S) 8.6-50 MG tablet Take 2 tablets by mouth at bedtime.    [provider]  tamsulosin (FLOMAX) 0.4 MG CAPS capsule Take 0.4 mg by mouth at bedtime.     [provider]    torsemide (DEMADEX) 20 MG tablet Take 40 mg by mouth. Take 2 tablets to = 40 mg    [provider]  vitamin B-12 (CYANOCOBALAMIN) 1000 MCG tablet Take 1,000 mcg by mouth daily.     [provider]  vitamin C (ASCORBIC ACID) 500 MG tablet Take 500 mg by mouth 2 (two) times daily.  08/06/15   [provider]    Physical Exam: Vitals:   06/11/17 2130 06/11/17 2145 06/11/17 2200 06/11/17 2230  BP: 125/64 126/63 126/62 (!) 117/55  Pulse: 70 72 72 (!) 56  Resp: (!) 22 18 19 19   Temp:      TempSrc:      SpO2: 98% 98% 98% 97%  Weight:      Height:       General: Not in acute distress HEENT:       Eyes: PERRL, EOMI, no scleral icterus.  ENT: No discharge from the ears and nose, no pharynx injection, no tonsillar enlargement.        Neck: No JVD, no bruit, no mass felt. Heme: No neck lymph node enlargement. Cardiac: S1/S2, RRR, No murmurs, No gallops or rubs. Respiratory: No rales, wheezing, rhonchi or rubs. GI: Soft, nondistended, nontender, no rebound pain, no organomegaly, BS present. GU: No hematuria Ext: Trace  leg edema bilaterally. 2+DP/PT pulse bilaterally. Musculoskeletal: No joint deformities, No joint redness or warmth, no limitation of ROM in spin. Skin: No rashes.  Neuro: Alert, oriented X3, cranial nerves II-XII grossly intact, moves all extremities normally. Psych: Patient is not psychotic, no suicidal or hemocidal ideation.  Labs on Admission: I have personally reviewed following labs and imaging studies  CBC:  Recent Labs Lab 06/11/17 1907  WBC 8.9  NEUTROABS 6.3  HGB 11.4*  HCT 33.7*  MCV 86.6  PLT 601   Basic Metabolic Panel:  Recent Labs Lab 06/09/17 06/11/17 1907  NA 136* 127*  K 3.4 5.0  CL  --  93*  CO2  --  22  GLUCOSE  --  275*  BUN 86* 127*  CREATININE 2.6* 4.14*  CALCIUM  --  8.9  MG  --  1.7   GFR: Estimated Creatinine Clearance: 11.7 mL/min (A) (by C-G formula based on SCr of 4.14 mg/dL (H)). Liver  Function Tests:  Recent Labs Lab 06/11/17 1907  AST 17  ALT 11*  ALKPHOS 48  BILITOT 0.7  PROT 7.0  ALBUMIN 3.6   No results for input(s): LIPASE, AMYLASE in the last 168 hours. No results for input(s): AMMONIA in the last 168 hours. Coagulation Profile: No results for input(s): INR, PROTIME in the last 168 hours. Cardiac Enzymes: No results for input(s): CKTOTAL, CKMB, CKMBINDEX, TROPONINI in the last 168 hours. BNP (last 3 results) No results for input(s): PROBNP in the last 8760 hours. HbA1C: No results for input(s): HGBA1C in the last 72 hours. CBG: No results for input(s): GLUCAP in the last 168 hours. Lipid Profile: No results for input(s): CHOL, HDL, LDLCALC, TRIG, CHOLHDL, LDLDIRECT in the last 72 hours. Thyroid Function Tests: No results for input(s): TSH, T4TOTAL, FREET4, T3FREE, THYROIDAB in the last 72 hours. Anemia Panel: No results for input(s): VITAMINB12, FOLATE, FERRITIN, TIBC, IRON, RETICCTPCT in the last 72 hours. Urine analysis:    Component Value Date/Time   COLORURINE YELLOW 06/20/2008 0258   APPEARANCEUR CLEAR 06/20/2008 0258   LABSPEC 1.020 03/30/2014 1442   PHURINE 5.5 03/30/2014 1442   GLUCOSEU NEGATIVE 03/30/2014 1442   HGBUR MODERATE (A) 03/30/2014 1442   BILIRUBINUR NEGATIVE 03/30/2014 1442   KETONESUR NEGATIVE 03/30/2014 1442   PROTEINUR 100 (A) 03/30/2014 1442   UROBILINOGEN 0.2 03/30/2014 1442   NITRITE POSITIVE (A) 03/30/2014 1442   LEUKOCYTESUR MODERATE (A) 03/30/2014 1442   Sepsis Labs: @LABRCNTIP (procalcitonin:4,lacticidven:4) )No results found for this or any previous visit (from the past 240 hour(s)).   Radiological Exams on Admission: Dg Chest 2 View  Result Date: 06/11/2017 CLINICAL DATA:  Cough. EXAM: CHEST  2 VIEW COMPARISON:  Radiograph of October 20, 2016. FINDINGS: Stable cardiomegaly. Status post coronary artery bypass graft. No pneumothorax or pleural effusion is noted. No acute pulmonary disease is noted. Bony thorax  is unremarkable. Atherosclerosis of thoracic aorta is noted. IMPRESSION: No active cardiopulmonary disease.  Aortic atherosclerosis. Electronically Signed   By: Marijo Conception, M.D.   On: 06/11/2017 20:00     EKG: Independently reviewed.  Sinus rhythm, QTC 480, early  R progression, Q wave in lead 3/aVF.   Assessment/Plan Principal Problem:   Acute renal failure superimposed on stage 3 chronic kidney disease (HCC) Active Problems:   Chronic combined systolic and diastolic CHF (congestive heart failure) (HCC)   Coronary artery disease involving coronary bypass graft of native heart with angina pectoris (HCC)   PSVT (paroxysmal supraventricular tachycardia) ([place])   Diabetes mellitus with renal manifestations, controlled ([place])   Pure hypercholesterolemia   BPH (benign prostatic hyperplasia)   GERD (gastroesophageal reflux disease)   Hyponatremia   Acute renal failure superimposed on stage 3 chronic kidney disease ([place]): Baseline creatinine 2.0-2.5, his creatinine is 4.4, BUN 127. Likely due to prerenal secondary to over diuresis.  - will place on tele bed for obs - gentle IVF: NS 75 cc/h (EF 40%) - hold Torsemid - Check FeUrea - Follow up renal function by BMP  Chronic combined systolic and diastolic CHF (congestive heart failure) ([place]): 2-D echo on 10/20/16 showed EF of 45-50 percent. Patient has trace leg edema, clinically CHF is compensated. -Hold torsemide due to worsening renal function -Check BNP -Continue metoprolol  CAD: s/p of CABG, no CP. -continue Crestor, metoprolol -When necessary nitroglycerin  PSVT (paroxysmal supraventricular tachycardia) ([place]): Heart rate 50s-70s -Continue metoprolol  DM-II: Last A1c 9.5 on 03/22/17, poorly controled. Patient is taking NovoLog and Lantus at home -will decrease Lantus dose from 40-30 daily  -SSI  HLD:  -Continue home medications: Crestor  BPH: stable - Continue Flomax  GERD: -Protonix  Hyponatremia: Sodium 127. Mental  status normal. Likely due to dehydration and continuation of torsemide. -Hold torsemide -IV normal saline as above -Repeat BMP in AM -Check TSH   DVT ppx: SQ Heparin     Code Status: DNR (I discussed with patient in the presence of his son, and explained the meaning of CODE STATUS. Patient wants to be DNR) Family Communication: Yes, patient's son at bed side Disposition Plan:  Anticipate discharge back to previous SNF Consults called:  none Admission status: Obs / tele    Date of Service 06/11/2017    Ivor Costa [facility] Pager [phone]  If 7PM-7AM, please contact night-coverage [email or web address] Password [facility] 06/11/2017, 10:46 PM

## 2017-06-11 NOTE — Telephone Encounter (Signed)
Call patient to schedule new patient appointment and update [facility] once this is done.

## 2017-06-11 NOTE — Progress Notes (Signed)
DATE:  06/11/2017   MRN:  [id number]  BIRTHDAY: 1920/04/10  Facility:  Nursing Home Location:  [facility] [place] Room Number: 656-C  LEVEL OF CARE:  SNF (31)  Contact Information    Name Relation Home Work Mobile   [place] Den[place] George [phone]     [place] [phone]         Code Status History    Date Active Date Inactive Code Status Order ID Comments User Context   10/20/2016  6:10 AM 10/21/2016  5:48 PM DNR [id number]  Rise Patience, MD Inpatient    Questions for Most Recent Historical Code Status (Order [id number])    Question Answer Comment   In the event of cardiac or respiratory ARREST Do not call a "code blue"    In the event of cardiac or respiratory ARREST Do not perform Intubation, CPR, defibrillation or ACLS    In the event of cardiac or respiratory ARREST Use medication by any route, position, wound care, and other measures to relive pain and suffering. May use oxygen, suction and manual treatment of airway obstruction as needed for comfort.        Chief Complaint  Patient presents with  . Acute Visit    Shortness of breath    HISTORY OF PRESENT ILLNESS:  This is a 22-YO male seen for an acute visit due to shortness of breath.  He is a long-term care resident at [facility].  He has a PMH of coronary artery disease S/P CABG, hypertension, diabetes mellitus and hyperlipidemia. He was seen in his room today. He said that he doesn't feel good but cannot pinpoint why. No reported fever nor cough. Chest x-ray done showed left lower lung infiltrate. He reported that he was short of breath last night. He is currently on room air and denies SOB. Torsemide dosage was decreased from 40 mg BID to Q D  on 06/09/17 due to elevated creatinine 2.63 with GFR 19.83. Torsemide is taken for CHF. He had gained 2.9 lbs in a day.    PAST MEDICAL HISTORY:  Past Medical History:  Diagnosis Date  . Actinic keratitis   .  CHF (congestive heart failure) ([place])   . Claudication ([place])   . Coronary artery disease    status post CABG, 1999  . Diabetes mellitus    Uncontrolled secondary to dietary noncompliance  . Diverticulosis   . DJD (degenerative joint disease)   . Hypertension    hypertensive syndrome with dyspnea -[facility], February, 2011 - EF 50% and cardiac bypass grafts all patent - responded to diuretics and blood pressure control - [name]  . Microscopic hematuria   . Onychomycosis   . Pneumonia 05/2017  . Prostate nodule   . PSVT (paroxysmal supraventricular tachycardia) ([place])   . Vitamin B12 deficiency   . Vitamin D deficiency      CURRENT MEDICATIONS: Reviewed  Patient's Medications  New Prescriptions   No medications on file  Previous Medications   ACETAMINOPHEN (TYLENOL) 325 MG TABLET    Take 650 mg by mouth every 6 (six) hours as needed for mild pain.   AMINO ACIDS-PROTEIN HYDROLYS (FEEDING SUPPLEMENT, PRO-STAT SUGAR FREE 64,) LIQD    Take 30 mLs by mouth 3 (three) times daily.   AMLODIPINE (NORVASC) 5 MG TABLET    Take 5 mg by mouth daily.   CHOLECALCIFEROL (VITAMIN D) 1000 UNITS TABLET    Take 1,000 Units by mouth daily.  DOCUSATE SODIUM (COLACE) 100 MG CAPSULE    Take 100 mg by mouth daily.   FLUTICASONE (FLONASE) 50 MCG/ACT NASAL SPRAY    Place 1 spray into both nostrils 2 (two) times daily as needed.    HYDRALAZINE (APRESOLINE) 10 MG TABLET    Take 10 mg by mouth 2 (two) times daily.   INSULIN ASPART (NOVOLOG) 100 UNIT/ML INJECTION    Inject 8 Units into the skin daily as needed for high blood sugar (if blood glucose >150).    INSULIN GLARGINE (LANTUS SOLOSTAR) 100 UNIT/ML SOLOSTAR PEN    Inject 40 Units into the skin at bedtime.    ISOSORBIDE MONONITRATE (IMDUR) 30 MG 24 HR TABLET    Take 30 mg by mouth daily.    LATANOPROST (XALATAN) 0.005 % OPHTHALMIC SOLUTION    Place 1 drop into both eyes at bedtime.    LORATADINE (CLARITIN) 10 MG TABLET    Take 10  mg by mouth daily.    METOPROLOL (LOPRESSOR) 50 MG TABLET    TAKE 1 TABLET (50 MG TOTAL) BY MOUTH 2 (TWO) TIMES DAILY.   MULTIPLE VITAMIN (MULTIVITAMIN) TABLET    Take 1 tablet by mouth daily.   NITROGLYCERIN (NITROSTAT) 0.4 MG SL TABLET    Place 0.4 mg under the tongue every 5 (five) minutes as needed for chest pain. Up to 3 doses   PANTOPRAZOLE (PROTONIX) 40 MG TABLET    Take 40 mg by mouth 2 (two) times daily.   POTASSIUM CHLORIDE (KLOR-CON) 20 MEQ PACKET    Take 20 mEq by mouth daily.    ROSUVASTATIN (CRESTOR) 5 MG TABLET    Take 1 tablet (5 mg total) by mouth 3 (three) times a week. Take 1 tablet Mon, Wed, Fri   SENNOSIDES-DOCUSATE SODIUM (SENOKOT-S) 8.6-50 MG TABLET    Take 2 tablets by mouth at bedtime.   TAMSULOSIN (FLOMAX) 0.4 MG CAPS CAPSULE    Take 0.4 mg by mouth at bedtime.    TORSEMIDE (DEMADEX) 20 MG TABLET    Take 40 mg by mouth. Take 2 tablets to = 40 mg   VITAMIN B-12 (CYANOCOBALAMIN) 1000 MCG TABLET    Take 1,000 mcg by mouth daily.    VITAMIN C (ASCORBIC ACID) 500 MG TABLET    Take 500 mg by mouth 2 (two) times daily.   Modified Medications   No medications on file  Discontinued Medications   No medications on file     No Known Allergies   REVIEW OF SYSTEMS:  GENERAL: no change in appetite, no fatigue, no weight changes, no fever, chills or weakness EYES: Denies change in vision, dry eyes, eye pain, itching or discharge EARS: Denies change in hearing, ringing in ears, or earache NOSE: Denies nasal congestion or epistaxis MOUTH and THROAT: Denies oral discomfort, gingival pain or bleeding, pain from teeth or hoarseness   RESPIRATORY: no cough, SOB, DOE, wheezing, hemoptysis CARDIAC: no chest pain or palpitations, +edema GI: no abdominal pain, diarrhea, constipation, heart burn, nausea or vomiting GU: Denies dysuria, frequency, hematuria, incontinence, or discharge MUSCULOSKELETAL: Denies joit pain, muscle pain, back pain, restricted movement, or unusual  weakness PSYCHIATRIC: Denies feeling of depression or anxiety. No report of hallucinations, insomnia, paranoia, or agitation   PHYSICAL EXAMINATION  GENERAL APPEARANCE: Well nourished. In no acute distress. Normal body habitus SKIN:  Skin is warm and dry.  HEAD: Normal in size and contour. No evidence of trauma EYES: Lids open and close normally. No blepharitis, entropion or ectropion. PERRL. Conjunctivae are  clear and sclerae are white. Lenses are without opacity EARS: Pinnae are normal. Patient hears normal voice tunes of the examiner MOUTH and THROAT: Lips are without lesions. Oral mucosa is moist and without lesions. Tongue is normal in shape, size, and color and without lesions NECK: supple, trachea midline, no neck masses, no thyroid tenderness, no thyromegaly LYMPHATICS: no LAN in the neck, no supraclavicular LAN RESPIRATORY: breathing is even & unlabored, BS CTAB CARDIAC: RRR, no murmur,no extra heart sounds, BLE 1+ edema GI: abdomen soft, normal BS, no masses, no tenderness, no hepatomegaly, no splenomegaly EXTREMITIES:  Able to move X 4 extremities PSYCHIATRIC: Alert and oriented X 3. Affect and behavior are appropriate    LABS/RADIOLOGY: Labs reviewed: 06/11/17   Wbc 9.8  hgb 11.9  mcv 87.2  Platelet 829 Basic Metabolic Panel:  Recent Labs  10/20/16 0007 10/20/16 0046 10/20/16 0717 10/21/16 0316  05/25/17 05/31/17 06/09/17  NA 136 138 136 138  < > 134* 138 136*  K 4.2 4.2 3.7 4.0  < > 3.7 3.4 3.4  CL 103 101 99* 101  --   --   --   --   CO2 23  --  26 28  --   --   --   --   GLUCOSE 233* 240* 231* 132*  --   --   --   --   BUN 28* 36* 29* 30*  < > 78* 79* 86*  CREATININE 1.56* 1.60* 1.65* 1.73*  < > 2.5* 2.3* 2.6*  CALCIUM 9.1  --  8.8* 8.6*  --   --   --   --   MG 1.8  --   --  2.1  --   --   --   --   < > = values in this interval not displayed. Liver Function Tests:  Recent Labs  10/20/16 0007 10/20/16 0717 12/04/16  AST 73* 41 23  ALT 39 33 28  ALKPHOS  77 64 44  BILITOT 1.0 1.3*  --   PROT 7.2 6.5  --   ALBUMIN 3.5 3.3*  --    CBC:  Recent Labs  10/05/16 0037 10/20/16 0007  10/20/16 0717 10/21/16 0316  12/22/16 12/23/16 01/06/17 02/11/17  WBC 19.6* 21.5*  --  13.9* 9.0  < > 10.6  --  7.6 10.0  NEUTROABS 16.7* 18.6*  --  11.4*  --   --   --   --   --   --   HGB 12.1* 11.1*  < > 9.9* 8.9*  < > 7.7* 7.4* 9.6* 12.0*  HCT 36.6* 33.3*  < > 30.6* 27.1*  < > 23* 23*  23* 32* 37*  MCV 88.2 87.9  --  87.4 87.7  --   --   --   --   --   PLT 214 158  --  181 173  < > 186  --  172 161  < > = values in this interval not displayed. Lipid Panel:  Recent Labs  04/17/17  HDL 51   Cardiac Enzymes:  Recent Labs  10/20/16 0717 10/20/16 1219 10/20/16 1825  TROPONINI 0.03* <0.03 0.04*   CBG:  Recent Labs  10/20/16 2108 10/21/16 0616 10/21/16 1138  GLUCAP 213* 160* 245*      ASSESSMENT/PLAN:  HCAP - chest x-ray showed left lower lung infiltrate, will start on Avelox 400 mg 1 tab PO Q D X 7 days and Florastor 250 mg 1 capsule PO BID X 10 days  Chronic diastolic heart failure - gained 2.9 lbs in 1 day, will increase Torsemide 20 mg give 2 tabs = 40 mg PO Q D and 20 mg 1 tab Q 5 PM, KCL ER 20 meq 1 tab PO Q AM and KCL ER 20 meq 1 tab PO Q 5PM on MWF, weigh daily  Acute on chronic kidney disease - will check BMP in 1 week Lab Results  Component Value Date   CREATININE 2.6 (A) 06/09/2017        Monina C. Kaibab - NP    [employer] [phone]

## 2017-06-11 NOTE — ED Triage Notes (Addendum)
Per EMS, Pt From [facility] for high BUN and creatinine. Pt's BUN and creatinine have been trending up over the last year but never this high. Pt alert and oriented to normal and ambulatory. VS 152/66, Pulse 68, spo2 99%
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
## 2017-06-12 DIAGNOSIS — I5042 Chronic combined systolic (congestive) and diastolic (congestive) heart failure: Secondary | ICD-10-CM | POA: Diagnosis not present

## 2017-06-12 DIAGNOSIS — N179 Acute kidney failure, unspecified: Secondary | ICD-10-CM

## 2017-06-12 DIAGNOSIS — N183 Chronic kidney disease, stage 3 (moderate): Secondary | ICD-10-CM | POA: Diagnosis not present

## 2017-06-12 DIAGNOSIS — E871 Hypo-osmolality and hyponatremia: Secondary | ICD-10-CM | POA: Diagnosis not present

## 2017-06-12 DIAGNOSIS — N189 Chronic kidney disease, unspecified: Secondary | ICD-10-CM

## 2017-06-12 DIAGNOSIS — E0821 Diabetes mellitus due to underlying condition with diabetic nephropathy: Secondary | ICD-10-CM | POA: Diagnosis not present

## 2017-06-12 LAB — BASIC METABOLIC PANEL
Anion gap: 11 (ref 5–15)
BUN: 122 mg/dL — AB (ref 6–20)
CALCIUM: 8.6 mg/dL — AB (ref 8.9–10.3)
CO2: 21 mmol/L — ABNORMAL LOW (ref 22–32)
CREATININE: 3.64 mg/dL — AB (ref 0.61–1.24)
Chloride: 100 mmol/L — ABNORMAL LOW (ref 101–111)
GFR calc Af Amer: 15 mL/min — ABNORMAL LOW (ref >60)
GFR, EST NON AFRICAN AMERICAN: 13 mL/min — AB (ref >60)
GLUCOSE: 156 mg/dL — AB (ref 65–99)
Potassium: 4.1 mmol/L (ref 3.5–5.1)
Sodium: 132 mmol/L — ABNORMAL LOW (ref 135–145)

## 2017-06-12 LAB — URINALYSIS, ROUTINE W REFLEX MICROSCOPIC
BILIRUBIN URINE: NEGATIVE
Glucose, UA: NEGATIVE mg/dL
Hgb urine dipstick: NEGATIVE
KETONES UR: NEGATIVE mg/dL
Leukocytes, UA: NEGATIVE
NITRITE: NEGATIVE
PH: 5 (ref 5.0–8.0)
Protein, ur: NEGATIVE mg/dL
Specific Gravity, Urine: 1.008 (ref 1.005–1.030)

## 2017-06-12 LAB — CBC
HEMATOCRIT: 30.6 % — AB (ref 39.0–52.0)
Hemoglobin: 10.1 g/dL — ABNORMAL LOW (ref 13.0–17.0)
MCH: 28.8 pg (ref 26.0–34.0)
MCHC: 33 g/dL (ref 30.0–36.0)
MCV: 87.2 fL (ref 78.0–100.0)
PLATELETS: 176 10*3/uL (ref 150–400)
RBC: 3.51 MIL/uL — ABNORMAL LOW (ref 4.22–5.81)
RDW: 15.2 % (ref 11.5–15.5)
WBC: 7.6 10*3/uL (ref 4.0–10.5)

## 2017-06-12 LAB — TSH: TSH: 2.069 u[IU]/mL (ref 0.350–4.500)

## 2017-06-12 LAB — GLUCOSE, CAPILLARY
GLUCOSE-CAPILLARY: 121 mg/dL — AB (ref 65–99)
GLUCOSE-CAPILLARY: 200 mg/dL — AB (ref 65–99)
Glucose-Capillary: 158 mg/dL — ABNORMAL HIGH (ref 65–99)
Glucose-Capillary: 164 mg/dL — ABNORMAL HIGH (ref 65–99)

## 2017-06-12 LAB — CREATININE, URINE, RANDOM: CREATININE, URINE: 37.27 mg/dL

## 2017-06-12 LAB — MRSA PCR SCREENING: MRSA by PCR: NEGATIVE

## 2017-06-12 LAB — NA AND K (SODIUM & POTASSIUM), RAND UR
Potassium Urine: 25 mmol/L
Sodium, Ur: 31 mmol/L

## 2017-06-12 NOTE — Progress Notes (Signed)
Patient telemetry rhythm showing atrial fibrillation with pauses up to 2.06 seconds and heart rate dropping to 40's while sleeping.Patient asymptomatic.Call placed to NP [place].No new orders received will continue to monitor patient.

## 2017-06-12 NOTE — Progress Notes (Signed)
Patient arrived to unit [facility]  bed 13 from emergency room.Assisted patient to bed by nursing staff.Oriented patient to nursing unit,call bell , and fall risk policy.Patient is high risk for falls yellow bracelet ,yellow socks, and bed alarm on for safety.No acute distress at present time .Will continue to monitor patient.

## 2017-06-12 NOTE — Progress Notes (Signed)
PROGRESS NOTE    Cole [name]  MBW:[id number] DOB: 1920/02/27 DOA: 06/11/2017 PCP: [name], MD   Brief Narrative: 83 y.o. male with medical history significant of hypertension, hyperlipidemia, diabetes mellitus, GERD, PSVT, chronic combined systolic and diastolic CHF, BPH, CAD, s/p of CABG, CKD-3, who presents with abnormal lab.  In ER patient was found to have creatinine 4.14 (baseline creatinine 2.0-2.5), sodium 129, BUN 127,  Assessment & Plan:  # Acute kidney injury on chronic kidney disease is stage IV: -UA unremarkable. Likely hemodynamically mediated in the setting of diuretics. Holding torsemide and started on gentle IV hydration. Serum creatinine level trending down. Monitor urine output. Monitor BMP. Avoid nephrotoxins  #Chronic combined systolic and diastolic congestive heart failure: Patient looks euvolemic on physical exam. Holding diuretics. Continue cardiac medication including metoprolol  #History of coronary artery disease status post CABG: No chest pain. Continue metoprolol, Crestor and nitroglycerin as needed  #H/o SVTs: Patient noted to have bradycardia overnight while sleeping. Has fluctuation in heart rate. He is asymptomatic. Continue metoprolol and monitor heart rate. -Echo in December 2017 with EF of 45-50%. -Serum potassium and magnesium acceptable.  #Type 2 diabetes: Continue current insulin regimen. Monitor blood sugar level.  #Hyperlipidemia: Continue Crestor  #BPH: Continue Flomax  #Hyponatremia: Serum sodium level improving. Monitor BMP. TSH level acceptable.  #Hypertension: Continue current medications including metoprolol, Norvasc and hydralazine. Monitor blood pressure closely. Social worker consult for discharge planning  DVT prophylaxis: Heparin subcutaneous Code Status: DO NOT RESUSCITATE Family Communication: No family at bedside Disposition Plan: Likely discharge to skilled facility in 1-2 days  Consultants:    None  Procedures: None Antimicrobials: None  Subjective: Seen and examined at bedside. Denied headache, dizziness, nausea vomiting chest with shortness of breath.  Objective: Vitals:   06/11/17 2230 06/11/17 2348 06/12/17 0421 06/12/17 0900  BP: (!) 117/55 (!) 125/58 (!) 106/39 (!) 119/47  Pulse: (!) 56  61 65  Resp: 19 16 18 16   Temp:  98.1 F (36.7 C) 98 F (36.7 C) 98 F (36.7 C)  TempSrc:  Oral Oral Oral  SpO2: 97% 97% 96% 98%  Weight:      Height:        Intake/Output Summary (Last 24 hours) at 06/12/17 1419 Last data filed at 06/12/17 0900  Gross per 24 hour  Intake           1142.5 ml  Output             1200 ml  Net            -57.5 ml   Filed Weights   06/11/17 1827  Weight: 86.6 kg (191 lb)    Examination:  General exam: Elderly male lying comfortable  Respiratory system: Clear to auscultation. Respiratory effort normal. No wheezing or crackle Cardiovascular system: S1 & S2 heard, RRR.  No pedal edema. Gastrointestinal system: Abdomen is nondistended, soft and nontender. Normal bowel sounds heard. Central nervous system: Alert awake and following commands.. Extremities: Symmetric 5 x 5 power. Skin: No rashes, lesions or ulcers     Data Reviewed: I have personally reviewed following labs and imaging studies  CBC:  Recent Labs Lab 06/11/17 1907 06/12/17 0323  WBC 8.9 7.6  NEUTROABS 6.3  --   HGB 11.4* 10.1*  HCT 33.7* 30.6*  MCV 86.6 87.2  PLT 183 017   Basic Metabolic Panel:  Recent Labs Lab 06/09/17 06/11/17 1907 06/12/17 0323  NA 136* 127* 132*  K 3.4 5.0 4.1  CL  --  93* 100*  CO2  --  22 21*  GLUCOSE  --  275* 156*  BUN 86* 127* 122*  CREATININE 2.6* 4.14* 3.64*  CALCIUM  --  8.9 8.6*  MG  --  1.7  --    GFR: Estimated Creatinine Clearance: 13.3 mL/min (A) (by C-G formula based on SCr of 3.64 mg/dL (H)). Liver Function Tests:  Recent Labs Lab 06/11/17 1907  AST 17  ALT 11*  ALKPHOS 48  BILITOT 0.7  PROT 7.0   ALBUMIN 3.6   No results for input(s): LIPASE, AMYLASE in the last 168 hours. No results for input(s): AMMONIA in the last 168 hours. Coagulation Profile: No results for input(s): INR, PROTIME in the last 168 hours. Cardiac Enzymes: No results for input(s): CKTOTAL, CKMB, CKMBINDEX, TROPONINI in the last 168 hours. BNP (last 3 results) No results for input(s): PROBNP in the last 8760 hours. HbA1C: No results for input(s): HGBA1C in the last 72 hours. CBG:  Recent Labs Lab 06/11/17 2346 06/12/17 0824 06/12/17 1252  GLUCAP 201* 121* 158*   Lipid Profile: No results for input(s): CHOL, HDL, LDLCALC, TRIG, CHOLHDL, LDLDIRECT in the last 72 hours. Thyroid Function Tests:  Recent Labs  06/12/17 0323  TSH 2.069   Anemia Panel: No results for input(s): VITAMINB12, FOLATE, FERRITIN, TIBC, IRON, RETICCTPCT in the last 72 hours. Sepsis Labs:  Recent Labs Lab 06/11/17 2210  LATICACIDVEN 1.22    Recent Results (from the past 240 hour(s))  MRSA PCR Screening     Status: None   Collection Time: 06/11/17 11:55 PM  Result Value Ref Range Status   MRSA by PCR NEGATIVE NEGATIVE Final    Comment:        The GeneXpert MRSA Assay (FDA approved for NASAL specimens only), is one component of a comprehensive MRSA colonization surveillance program. It is not intended to diagnose MRSA infection nor to guide or monitor treatment for MRSA infections.          Radiology Studies: Dg Chest 2 View  Result Date: 06/11/2017 CLINICAL DATA:  Cough. EXAM: CHEST  2 VIEW COMPARISON:  Radiograph of October 20, 2016. FINDINGS: Stable cardiomegaly. Status post coronary artery bypass graft. No pneumothorax or pleural effusion is noted. No acute pulmonary disease is noted. Bony thorax is unremarkable. Atherosclerosis of thoracic aorta is noted. IMPRESSION: No active cardiopulmonary disease.  Aortic atherosclerosis. Electronically Signed   By: Marijo Conception, M.D.   On: 06/11/2017 20:00         Scheduled Meds: . amLODipine  5 mg Oral Daily  . cholecalciferol  1,000 Units Oral Daily  . docusate sodium  100 mg Oral Daily  . feeding supplement (PRO-STAT SUGAR FREE 64)  30 mL Oral TID  . fluticasone  1 spray Each Nare BID  . heparin subcutaneous  5,000 Units Subcutaneous Q8H  . hydrALAZINE  10 mg Oral BID  . insulin aspart  0-9 Units Subcutaneous TID WC  . insulin glargine  30 Units Subcutaneous QHS  . isosorbide mononitrate  30 mg Oral Daily  . latanoprost  1 drop Both Eyes QPM  . loratadine  10 mg Oral Daily  . metoprolol tartrate  50 mg Oral BID  . multivitamin with minerals  1 tablet Oral Daily  . pantoprazole  40 mg Oral BID  . [START ON 06/14/2017] rosuvastatin  5 mg Oral Once per day on Mon Wed Fri  . senna-docusate  2 tablet Oral QHS  . tamsulosin  0.4  mg Oral QHS  . vitamin B-12  1,000 mcg Oral Daily  . vitamin C  500 mg Oral BID   Continuous Infusions: . sodium chloride 75 mL/hr at 06/12/17 0617     LOS: 0 days    Dron Tanna Furry, MD [facility] Pager [phone]  If 7PM-7AM, please contact night-coverage [email or web address] Password [email or web address] 06/12/2017, 2:19 PM

## 2017-06-13 DIAGNOSIS — N179 Acute kidney failure, unspecified: Secondary | ICD-10-CM | POA: Diagnosis not present

## 2017-06-13 DIAGNOSIS — E860 Dehydration: Secondary | ICD-10-CM | POA: Diagnosis not present

## 2017-06-13 DIAGNOSIS — E871 Hypo-osmolality and hyponatremia: Secondary | ICD-10-CM | POA: Diagnosis not present

## 2017-06-13 DIAGNOSIS — N183 Chronic kidney disease, stage 3 (moderate): Secondary | ICD-10-CM | POA: Diagnosis not present

## 2017-06-13 DIAGNOSIS — E889 Metabolic disorder, unspecified: Secondary | ICD-10-CM | POA: Diagnosis not present

## 2017-06-13 DIAGNOSIS — E0821 Diabetes mellitus due to underlying condition with diabetic nephropathy: Secondary | ICD-10-CM | POA: Diagnosis not present

## 2017-06-13 DIAGNOSIS — I5042 Chronic combined systolic (congestive) and diastolic (congestive) heart failure: Secondary | ICD-10-CM | POA: Diagnosis not present

## 2017-06-13 LAB — CBC
HEMATOCRIT: 33.1 % — AB (ref 39.0–52.0)
HEMOGLOBIN: 10.8 g/dL — AB (ref 13.0–17.0)
MCH: 28.4 pg (ref 26.0–34.0)
MCHC: 32.6 g/dL (ref 30.0–36.0)
MCV: 87.1 fL (ref 78.0–100.0)
Platelets: 182 10*3/uL (ref 150–400)
RBC: 3.8 MIL/uL — AB (ref 4.22–5.81)
RDW: 14.9 % (ref 11.5–15.5)
WBC: 6.2 10*3/uL (ref 4.0–10.5)

## 2017-06-13 LAB — GLUCOSE, CAPILLARY
GLUCOSE-CAPILLARY: 100 mg/dL — AB (ref 65–99)
Glucose-Capillary: 195 mg/dL — ABNORMAL HIGH (ref 65–99)

## 2017-06-13 LAB — BASIC METABOLIC PANEL
Anion gap: 8 (ref 5–15)
BUN: 90 mg/dL — AB (ref 6–20)
CALCIUM: 9 mg/dL (ref 8.9–10.3)
CO2: 24 mmol/L (ref 22–32)
CREATININE: 2.67 mg/dL — AB (ref 0.61–1.24)
Chloride: 110 mmol/L (ref 101–111)
GFR calc Af Amer: 22 mL/min — ABNORMAL LOW (ref >60)
GFR, EST NON AFRICAN AMERICAN: 19 mL/min — AB (ref >60)
GLUCOSE: 90 mg/dL (ref 65–99)
Potassium: 4.1 mmol/L (ref 3.5–5.1)
SODIUM: 142 mmol/L (ref 135–145)

## 2017-06-13 LAB — UREA NITROGEN, URINE: Urea Nitrogen, Ur: 471 mg/dL

## 2017-06-13 MED ORDER — INSULIN GLARGINE 100 UNIT/ML SOLOSTAR PEN: 30.0000 [IU] | PEN_INJECTOR | Freq: Every day | SUBCUTANEOUS | 0 refills | Status: DC

## 2017-06-13 NOTE — Clinical Social Work Note (Signed)
Clinical Social Worker facilitated patient discharge including contacting patient family and facility to confirm patient discharge plans.  Clinical information faxed to facility and family agreeable with plan.  CSW arranged ambulance transport via [facility] to [place].  RN to call [phone] for report prior to discharge. Patient will go to room 323.  Clinical Social Worker will sign off for now as social work intervention is no longer needed. Please consult [place] again if new need arises.  [place], [place]

## 2017-06-13 NOTE — Progress Notes (Signed)
Report called to [facility]. Pt is returning back to facility. Waiting on transport.

## 2017-06-13 NOTE — Clinical Social Work Placement (Signed)
   CLINICAL SOCIAL WORK PLACEMENT  NOTE  Date:  06/13/2017  Patient Details  Name: EFE FAZZINO MRN: [id number] Date of Birth: 27-Apr-1920  Clinical Social Work is seeking post-discharge placement for this patient at the [place] level of care (*CSW will initial, date and re-position this form in  chart as items are completed):      Patient/family provided with [facility] Work Department's list of facilities offering this level of care within the geographic area requested by the patient (or if unable, by the patient's family).  Yes   Patient/family informed of their freedom to choose among providers that offer the needed level of care, that participate in Medicare, Medicaid or managed care program needed by the patient, have an available bed and are willing to accept the patient.      Patient/family informed of [facility]'s ownership interest in [facility] and [facility], as well as of the fact that they are under no obligation to receive care at these facilities.  PASRR submitted to EDS on       PASRR number received on 06/13/17     Existing PASRR number confirmed on       FL2 transmitted to all facilities in geographic area requested by pt/family on 06/13/17     FL2 transmitted to all facilities within larger geographic area on       Patient informed that his/her managed care company has contracts with or will negotiate with certain facilities, including the following:        Yes   Patient/family informed of bed offers received.  Patient chooses bed at [facility] recommends and patient chooses bed at      Patient to be transferred to [facility] on 06/13/17.  Patient to be transferred to facility by [facility]     Patient family notified on 06/13/17 of transfer.  Name of family member notified:  Patient     PHYSICIAN       Additional Comment:     _______________________________________________ Eileen Stanford, LCSW 06/13/2017, 12:37 PM

## 2017-06-13 NOTE — Progress Notes (Signed)
Cole [name] to be D/C'd Home per MD order.  Discussed prescriptions and follow up appointments with the patient. Prescriptions given to patient, medication list explained in detail. Pt verbalized understanding.  Allergies as of 06/13/2017   No Known Allergies     Medication List    STOP taking these medications   potassium chloride 20 MEQ packet Commonly known as:  KLOR-CON   torsemide 20 MG tablet Commonly known as:  DEMADEX     TAKE these medications   acetaminophen 325 MG tablet Commonly known as:  TYLENOL Take 650 mg by mouth every 6 (six) hours as needed for mild pain.   amLODipine 5 MG tablet Commonly known as:  NORVASC Take 5 mg by mouth daily.   bisacodyl 10 MG suppository Commonly known as:  DULCOLAX Place 10 mg rectally once as needed (FOR CONSTIPATION NOT RELIEVED BY MILK OF MAGNESIA).   cholecalciferol 1000 units tablet Commonly known as:  VITAMIN D Take 1,000 Units by mouth daily.   docusate sodium 100 MG capsule Commonly known as:  COLACE Take 100 mg by mouth daily. HOLD FOR LOOSE STOOLS   feeding supplement (PRO-STAT SUGAR FREE 64) Liqd Take 30 mLs by mouth 3 (three) times daily.   fluticasone 50 MCG/ACT nasal spray Commonly known as:  FLONASE Place 1 spray into both nostrils 2 (two) times daily.   hydrALAZINE 10 MG tablet Commonly known as:  APRESOLINE Take 10 mg by mouth 2 (two) times daily.   Insulin Glargine 100 UNIT/ML Solostar Pen Commonly known as:  LANTUS SOLOSTAR Inject 30 Units into the skin at bedtime. What changed:  how much to take   ipratropium-albuterol 0.5-2.5 (3) MG/3ML Soln Commonly known as:  DUONEB Take 3 mLs by nebulization every 6 (six) hours as needed for wheezing or shortness of breath.   isosorbide mononitrate 30 MG 24 hr tablet Commonly known as:  IMDUR Take 30 mg by mouth daily.   latanoprost 0.005 % ophthalmic solution Commonly known as:  XALATAN Place 1 drop into both eyes every evening.   loratadine 10 MG  tablet Commonly known as:  CLARITIN Take 10 mg by mouth daily.   magnesium hydroxide 400 MG/5ML suspension Commonly known as:  MILK OF MAGNESIA Take 30 mLs by mouth once as needed (IF NO BOWEL MOVEMENT IN 3 DAYS).   metoprolol tartrate 50 MG tablet Commonly known as:  LOPRESSOR TAKE 1 TABLET (50 MG TOTAL) BY MOUTH 2 (TWO) TIMES DAILY.   multivitamin tablet Take 1 tablet by mouth daily.   nitroGLYCERIN 0.4 MG SL tablet Commonly known as:  NITROSTAT Place 0.4 mg under the tongue every 5 (five) minutes as needed for chest pain. Up to 3 doses   NOVOLOG 100 UNIT/ML injection Generic drug:  insulin aspart Inject 8-12 Units into the skin See admin instructions. "THREE TIMES A DAY AND AT BEDTIME: 5 UNITS FOR A BGL >150 and 8 UNITS FOR A BGL >300 and 12 UNITS FOR A BGL >400"   OVER THE COUNTER MEDICATION Apply moisturizer to both feet daily   pantoprazole 40 MG tablet Commonly known as:  PROTONIX Take 40 mg by mouth 2 (two) times daily.   RA SALINE ENEMA 19-7 GM/118ML Enem Place 1 enema rectally once as needed (FOR CONSTIPATION NOT RELIEVED BY A DULCOLAX SUPPOSITORY). AND CALL MD IF NO RELIEF FROM A SALINE ENEMA   rosuvastatin 5 MG tablet Commonly known as:  CRESTOR Take 1 tablet (5 mg total) by mouth 3 (three) times a week. Take  1 tablet Mon, Wed, Fri What changed:  when to take this  additional instructions   sennosides-docusate sodium 8.6-50 MG tablet Commonly known as:  SENOKOT-S Take 2 tablets by mouth at bedtime.   tamsulosin 0.4 MG Caps capsule Commonly known as:  FLOMAX Take 0.4 mg by mouth at bedtime.   vitamin B-12 1000 MCG tablet Commonly known as:  CYANOCOBALAMIN Take 1,000 mcg by mouth daily.   vitamin C 500 MG tablet Commonly known as:  ASCORBIC ACID Take 500 mg by mouth 2 (two) times daily.       Vitals:   06/13/17 0500 06/13/17 1000  BP: (!) 107/38 (!) 145/54  Pulse: 60 66  Resp: 15 18  Temp: 98.5 F (36.9 C) 98.4 F (36.9 C)    Skin  clean, dry and intact without evidence of skin break down, no evidence of skin tears noted. IV catheter discontinued intact. Site without signs and symptoms of complications. Dressing and pressure applied. Pt denies pain at this time. No complaints noted.  An After Visit Summary was printed and given to the patient. Patient escorted via [place], and D/C home via private auto.  Haywood Lasso BSN, RN [facility] Phone [phone]

## 2017-06-13 NOTE — Clinical Social Work Note (Signed)
Pt is from [facility]. RN called CSW to explain pt has been d/c. Facility will take pt back today. Pt has been private paying therefore no auth needed.   [place], [place]

## 2017-06-13 NOTE — Discharge Summary (Signed)
Physician Discharge Summary  Cole [name] RSW:[id number] DOB: 02-Jul-1920 DOA: 06/11/2017  PCP: [name], MD  Admit date: 06/11/2017 Discharge date: 06/13/2017  Admitted From:SNF Disposition:SNF  Recommendations for Outpatient Follow-up:  1. Follow up with PCP in 1-2 weeks 2. Please obtain BMP/CBC in one week  Home Health:SNF Equipment/Devices:no Discharge Condition:stable CODE STATUS:DNR Diet recommendation:carb modified heart healthy diet  Brief/Interim Summary: 83 y.o.malewith medical history significant of hypertension, hyperlipidemia, diabetes mellitus, GERD, PSVT, chronic combined systolic and diastolic CHF, BPH, CAD, s/p of CABG, CKD-3, who presents with abnormal lab. In ER patient was found to have creatinine 4.14 (baseline creatinine 2.0-2.5), sodium 129, BUN 127,  # Acute kidney injury on chronic kidney disease is stage IV: -UA unremarkable. Likely hemodynamically mediated in the setting of diuretics. Treated with IV fluid with improvement in serum creatinine level around baseline. Discontinue torsemide and potassium chloride on discharge. Recommended to monitor daily weight and repeat BMP in a week. Patient looks euvolemic on exam on discharge. Recommended to follow up with PCP for the evaluation of resuming diuretics depending on volume status and renal function.  #Chronic combined systolic and diastolic congestive heart failure: Patient is euvolemic on exam. Continue metoprolol at current cardiac medications.  #History of coronary artery disease status post CABG: No chest pain. Continue metoprolol, Crestor and nitroglycerin as needed  #H/o SVTs: Patient noted to have bradycardia overnight while sleeping. Has fluctuation in heart rate. He is asymptomatic. Continue metoprolol and monitor heart rate. -Echo in December 2017 with EF of 45-50%. -Serum potassium and magnesium acceptable.  #Type 2 diabetes: Continue current insulin regimen. Monitor blood sugar  level.  #Hyperlipidemia: Continue Crestor  #BPH: Continue Flomax  #Hyponatremia: Serum sodium level improved. TSH level acceptable.  #Hypertension: Continue current medications including metoprolol, Norvasc and hydralazine. Blood pressure acceptable.  Discharge Diagnoses:  Principal Problem:   Acute renal failure superimposed on stage 3 chronic kidney disease (HCC) Active Problems:   Chronic combined systolic and diastolic CHF (congestive heart failure) (HCC)   Coronary artery disease involving coronary bypass graft of native heart with angina pectoris (HCC)   PSVT (paroxysmal supraventricular tachycardia) ([place])   Diabetes mellitus with renal manifestations, controlled ([place])   Pure hypercholesterolemia   BPH (benign prostatic hyperplasia)   GERD (gastroesophageal reflux disease)   Hyponatremia   Acute renal failure [facility])    Discharge Instructions  Discharge Instructions    (HEART FAILURE PATIENTS) Call MD:  Anytime you have any of the following symptoms: 1) 3 pound weight gain in 24 hours or 5 pounds in 1 week 2) shortness of breath, with or without a dry hacking cough 3) swelling in the hands, feet or stomach 4) if you have to sleep on extra pillows at night in order to breathe.    Complete by:  As directed    Call MD for:  difficulty breathing, headache or visual disturbances    Complete by:  As directed    Call MD for:  extreme fatigue    Complete by:  As directed    Call MD for:  hives    Complete by:  As directed    Call MD for:  persistant dizziness or light-headedness    Complete by:  As directed    Call MD for:  persistant nausea and vomiting    Complete by:  As directed    Call MD for:  severe uncontrolled pain    Complete by:  As directed    Call MD for:  temperature >100.4  Complete by:  As directed    Diet - low sodium heart healthy    Complete by:  As directed    Diet Carb Modified    Complete by:  As directed    Increase activity slowly    Complete  by:  As directed      Allergies as of 06/13/2017   No Known Allergies     Medication List    STOP taking these medications   potassium chloride 20 MEQ packet Commonly known as:  KLOR-CON   torsemide 20 MG tablet Commonly known as:  DEMADEX     TAKE these medications   acetaminophen 325 MG tablet Commonly known as:  TYLENOL Take 650 mg by mouth every 6 (six) hours as needed for mild pain.   amLODipine 5 MG tablet Commonly known as:  NORVASC Take 5 mg by mouth daily.   bisacodyl 10 MG suppository Commonly known as:  DULCOLAX Place 10 mg rectally once as needed (FOR CONSTIPATION NOT RELIEVED BY MILK OF MAGNESIA).   cholecalciferol 1000 units tablet Commonly known as:  VITAMIN D Take 1,000 Units by mouth daily.   docusate sodium 100 MG capsule Commonly known as:  COLACE Take 100 mg by mouth daily. HOLD FOR LOOSE STOOLS   feeding supplement (PRO-STAT SUGAR FREE 64) Liqd Take 30 mLs by mouth 3 (three) times daily.   fluticasone 50 MCG/ACT nasal spray Commonly known as:  FLONASE Place 1 spray into both nostrils 2 (two) times daily.   hydrALAZINE 10 MG tablet Commonly known as:  APRESOLINE Take 10 mg by mouth 2 (two) times daily.   Insulin Glargine 100 UNIT/ML Solostar Pen Commonly known as:  LANTUS SOLOSTAR Inject 30 Units into the skin at bedtime. What changed:  how much to take   ipratropium-albuterol 0.5-2.5 (3) MG/3ML Soln Commonly known as:  DUONEB Take 3 mLs by nebulization every 6 (six) hours as needed for wheezing or shortness of breath.   isosorbide mononitrate 30 MG 24 hr tablet Commonly known as:  IMDUR Take 30 mg by mouth daily.   latanoprost 0.005 % ophthalmic solution Commonly known as:  XALATAN Place 1 drop into both eyes every evening.   loratadine 10 MG tablet Commonly known as:  CLARITIN Take 10 mg by mouth daily.   magnesium hydroxide 400 MG/5ML suspension Commonly known as:  MILK OF MAGNESIA Take 30 mLs by mouth once as needed (IF  NO BOWEL MOVEMENT IN 3 DAYS).   metoprolol tartrate 50 MG tablet Commonly known as:  LOPRESSOR TAKE 1 TABLET (50 MG TOTAL) BY MOUTH 2 (TWO) TIMES DAILY.   multivitamin tablet Take 1 tablet by mouth daily.   nitroGLYCERIN 0.4 MG SL tablet Commonly known as:  NITROSTAT Place 0.4 mg under the tongue every 5 (five) minutes as needed for chest pain. Up to 3 doses   NOVOLOG 100 UNIT/ML injection Generic drug:  insulin aspart Inject 8-12 Units into the skin See admin instructions. "THREE TIMES A DAY AND AT BEDTIME: 5 UNITS FOR A BGL >150 and 8 UNITS FOR A BGL >300 and 12 UNITS FOR A BGL >400"   OVER THE COUNTER MEDICATION Apply moisturizer to both feet daily   pantoprazole 40 MG tablet Commonly known as:  PROTONIX Take 40 mg by mouth 2 (two) times daily.   RA SALINE ENEMA 19-7 GM/118ML Enem Place 1 enema rectally once as needed (FOR CONSTIPATION NOT RELIEVED BY A DULCOLAX SUPPOSITORY). AND CALL MD IF NO RELIEF FROM A SALINE ENEMA  rosuvastatin 5 MG tablet Commonly known as:  CRESTOR Take 1 tablet (5 mg total) by mouth 3 (three) times a week. Take 1 tablet Mon, Wed, Fri What changed:  when to take this  additional instructions   sennosides-docusate sodium 8.6-50 MG tablet Commonly known as:  SENOKOT-S Take 2 tablets by mouth at bedtime.   tamsulosin 0.4 MG Caps capsule Commonly known as:  FLOMAX Take 0.4 mg by mouth at bedtime.   vitamin B-12 1000 MCG tablet Commonly known as:  CYANOCOBALAMIN Take 1,000 mcg by mouth daily.   vitamin C 500 MG tablet Commonly known as:  ASCORBIC ACID Take 500 mg by mouth 2 (two) times daily.      Follow-up Information    [name], MD. Schedule an appointment as soon as possible for a visit in 1 week(s).   Specialty:  Internal Medicine Contact information: [place] [address] [phone]          No Known Allergies  Consultations: None  Procedures/Studies: None  Subjective: Seen and examined at  bedside. Denied headache, dizziness, nausea vomiting chest pain shortness of breath.  Discharge Exam: Vitals:   06/13/17 0500 06/13/17 1000  BP: (!) 107/38 (!) 145/54  Pulse: 60 66  Resp: 15 18  Temp: 98.5 F (36.9 C) 98.4 F (36.9 C)   Vitals:   06/12/17 1648 06/12/17 2028 06/13/17 0500 06/13/17 1000  BP: (!) 127/46 138/64 (!) 107/38 (!) 145/54  Pulse: 65 69 60 66  Resp: 16 17 15 18   Temp: 97.6 F (36.4 C) 98.6 F (37 C) 98.5 F (36.9 C) 98.4 F (36.9 C)  TempSrc: Oral Oral Oral Oral  SpO2: 100% 98% 95% 100%  Weight:  86.7 kg (191 lb 2 oz)    Height:        General: Pt is alert, awake, not in acute distress Cardiovascular: RRR, S1/S2 +, no rubs, no gallops Respiratory: CTA bilaterally, no wheezing, no rhonchi Abdominal: Soft, NT, ND, bowel sounds + Extremities: no edema, no cyanosis    The results of significant diagnostics from this hospitalization (including imaging, microbiology, ancillary and laboratory) are listed below for reference.     Microbiology: Recent Results (from the past 240 hour(s))  MRSA PCR Screening     Status: None   Collection Time: 06/11/17 11:55 PM  Result Value Ref Range Status   MRSA by PCR NEGATIVE NEGATIVE Final    Comment:        The GeneXpert MRSA Assay (FDA approved for NASAL specimens only), is one component of a comprehensive MRSA colonization surveillance program. It is not intended to diagnose MRSA infection nor to guide or monitor treatment for MRSA infections.      Labs: BNP (last 3 results)  Recent Labs  10/05/16 0037 10/20/16 0007 06/11/17 1907  BNP 568.6* 658.4* 937.3*   Basic Metabolic Panel:  Recent Labs Lab 06/09/17 06/11/17 1907 06/12/17 0323 06/13/17 0606  NA 136* 127* 132* 142  K 3.4 5.0 4.1 4.1  CL  --  93* 100* 110  CO2  --  22 21* 24  GLUCOSE  --  275* 156* 90  BUN 86* 127* 122* 90*  CREATININE 2.6* 4.14* 3.64* 2.67*  CALCIUM  --  8.9 8.6* 9.0  MG  --  1.7  --   --    Liver Function  Tests:  Recent Labs Lab 06/11/17 1907  AST 17  ALT 11*  ALKPHOS 48  BILITOT 0.7  PROT 7.0  ALBUMIN  3.6   No results for input(s): LIPASE, AMYLASE in the last 168 hours. No results for input(s): AMMONIA in the last 168 hours. CBC:  Recent Labs Lab 06/11/17 1907 06/12/17 0323 06/13/17 0606  WBC 8.9 7.6 6.2  NEUTROABS 6.3  --   --   HGB 11.4* 10.1* 10.8*  HCT 33.7* 30.6* 33.1*  MCV 86.6 87.2 87.1  PLT 183 176 182   Cardiac Enzymes: No results for input(s): CKTOTAL, CKMB, CKMBINDEX, TROPONINI in the last 168 hours. BNP: Invalid input(s): POCBNP CBG:  Recent Labs Lab 06/12/17 0824 06/12/17 1252 06/12/17 1649 06/12/17 2123 06/13/17 0741  GLUCAP 121* 158* 164* 200* 100*   D-Dimer No results for input(s): DDIMER in the last 72 hours. Hgb A1c No results for input(s): HGBA1C in the last 72 hours. Lipid Profile No results for input(s): CHOL, HDL, LDLCALC, TRIG, CHOLHDL, LDLDIRECT in the last 72 hours. Thyroid function studies  Recent Labs  06/12/17 0323  TSH 2.069   Anemia work up No results for input(s): VITAMINB12, FOLATE, FERRITIN, TIBC, IRON, RETICCTPCT in the last 72 hours. Urinalysis    Component Value Date/Time   COLORURINE STRAW (A) 06/11/2017 2124   APPEARANCEUR CLEAR 06/11/2017 2124   LABSPEC 1.008 06/11/2017 2124   PHURINE 5.0 06/11/2017 2124   GLUCOSEU NEGATIVE 06/11/2017 2124   HGBUR NEGATIVE 06/11/2017 2124   BILIRUBINUR NEGATIVE 06/11/2017 2124   KETONESUR NEGATIVE 06/11/2017 2124   PROTEINUR NEGATIVE 06/11/2017 2124   UROBILINOGEN 0.2 03/30/2014 1442   NITRITE NEGATIVE 06/11/2017 2124   LEUKOCYTESUR NEGATIVE 06/11/2017 2124   Sepsis Labs Invalid input(s): PROCALCITONIN,  WBC,  LACTICIDVEN Microbiology Recent Results (from the past 240 hour(s))  MRSA PCR Screening     Status: None   Collection Time: 06/11/17 11:55 PM  Result Value Ref Range Status   MRSA by PCR NEGATIVE NEGATIVE Final    Comment:        The GeneXpert MRSA Assay  (FDA approved for NASAL specimens only), is one component of a comprehensive MRSA colonization surveillance program. It is not intended to diagnose MRSA infection nor to guide or monitor treatment for MRSA infections.      Time coordinating discharge: 30 minutes  SIGNED:   Rosita Fire, MD  [facility] 06/13/2017, 11:56 AM  If 7PM-7AM, please contact night-coverage [email or web address] Password [email or web address]
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
## 2017-06-14 ENCOUNTER — Telehealth: Payer: Self-pay

## 2017-06-14 NOTE — Telephone Encounter (Signed)
This is a patient of [place], who was admitted to [facility] after hospitalization. [facility] F/U is needed. Hospital discharge from [facility] on 06/13/2017.

## 2017-06-15 ENCOUNTER — Telehealth: Payer: Self-pay

## 2017-06-15 ENCOUNTER — Telehealth: Payer: Self-pay | Admitting: Internal Medicine

## 2017-06-15 ENCOUNTER — Encounter: Payer: Self-pay | Admitting: Internal Medicine

## 2017-06-15 ENCOUNTER — Non-Acute Institutional Stay (SKILLED_NURSING_FACILITY): Payer: Medicare HMO | Admitting: Internal Medicine

## 2017-06-15 DIAGNOSIS — I5042 Chronic combined systolic (congestive) and diastolic (congestive) heart failure: Secondary | ICD-10-CM | POA: Diagnosis not present

## 2017-06-15 DIAGNOSIS — N183 Chronic kidney disease, stage 3 (moderate): Secondary | ICD-10-CM | POA: Diagnosis not present

## 2017-06-15 DIAGNOSIS — N184 Chronic kidney disease, stage 4 (severe): Secondary | ICD-10-CM | POA: Diagnosis not present

## 2017-06-15 DIAGNOSIS — E0821 Diabetes mellitus due to underlying condition with diabetic nephropathy: Secondary | ICD-10-CM

## 2017-06-15 DIAGNOSIS — E1129 Type 2 diabetes mellitus with other diabetic kidney complication: Secondary | ICD-10-CM | POA: Diagnosis not present

## 2017-06-15 DIAGNOSIS — N179 Acute kidney failure, unspecified: Secondary | ICD-10-CM

## 2017-06-15 NOTE — Assessment & Plan Note (Signed)
An  A1c of 9% or less  is the safest goal for him; it is critical to prevent hypoglycemia. Recheck A1c in mid August

## 2017-06-15 NOTE — Assessment & Plan Note (Addendum)
06/15/17 creatinine almost back to baseline with a value of 2.67. GFR 19. Nephrology consultation 7/31 recommends avoidance of diuretics, daily weights, no nonsteroidal anti-inflammatory agents and follow-up in 4 weeks Problematic is his combined heart failure, Dr. Pernell Dupre will be reconsulted in the context of nephrology's recommendation of avoiding diuretics

## 2017-06-15 NOTE — Telephone Encounter (Signed)
Nursing Secretary, Deborah calling to check on staus of referral. Please call back.  ° °Ty, °-LL °

## 2017-06-15 NOTE — Assessment & Plan Note (Addendum)
06/15/17 clinically euvolemic,but weight up 7 pounds off Torsemide. Presently 0.8 pounds heavier than weight at admission 7/27. As nephrology recommends avoiding diuretics, cardiology reconsultation with Dr. Pernell Dupre is warranted  with his dietary noncompliance, absence of diuretics in his regimen seemingly would make it extremely difficult to control his heart failure.
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
## 2017-06-15 NOTE — Telephone Encounter (Signed)
Possible re-admission to facility. This is a patient you were seeing at [facility]. [facility] F/U is needed if patient was re-admitted to facility upon discharge. Hospital discharge from [facility] on 06/13/17

## 2017-06-16 ENCOUNTER — Encounter: Payer: Self-pay | Admitting: Internal Medicine

## 2017-06-16 NOTE — Patient Instructions (Signed)
See assessment and plan under each diagnosis in the problem list and acutely for this visit 

## 2017-06-19 DIAGNOSIS — Z0189 Encounter for other specified special examinations: Secondary | ICD-10-CM | POA: Diagnosis not present

## 2017-06-19 DIAGNOSIS — D649 Anemia, unspecified: Secondary | ICD-10-CM | POA: Diagnosis not present

## 2017-06-19 DIAGNOSIS — E119 Type 2 diabetes mellitus without complications: Secondary | ICD-10-CM | POA: Diagnosis not present

## 2017-06-19 DIAGNOSIS — E559 Vitamin D deficiency, unspecified: Secondary | ICD-10-CM | POA: Diagnosis not present

## 2017-06-19 LAB — BASIC METABOLIC PANEL
BUN: 53 — AB (ref 4–21)
CREATININE: 1.9 — AB (ref 0.6–1.3)
Glucose: 166
POTASSIUM: 4.7 (ref 3.4–5.3)
Sodium: 137 (ref 137–147)

## 2017-06-19 LAB — LIPID PANEL
CHOLESTEROL: 181 (ref 0–200)
HDL: 49 (ref 35–70)
LDL Cholesterol: 117
TRIGLYCERIDES: 75 (ref 40–160)

## 2017-06-21 DIAGNOSIS — E559 Vitamin D deficiency, unspecified: Secondary | ICD-10-CM | POA: Diagnosis not present

## 2017-06-21 DIAGNOSIS — D649 Anemia, unspecified: Secondary | ICD-10-CM | POA: Diagnosis not present

## 2017-06-22 ENCOUNTER — Emergency Department (HOSPITAL_COMMUNITY): Payer: Medicare HMO

## 2017-06-22 ENCOUNTER — Encounter: Payer: Self-pay | Admitting: Nurse Practitioner

## 2017-06-22 ENCOUNTER — Ambulatory Visit (INDEPENDENT_AMBULATORY_CARE_PROVIDER_SITE_OTHER): Payer: Self-pay | Admitting: Nurse Practitioner

## 2017-06-22 ENCOUNTER — Encounter (HOSPITAL_COMMUNITY): Payer: Self-pay | Admitting: Adult Health

## 2017-06-22 ENCOUNTER — Inpatient Hospital Stay (HOSPITAL_COMMUNITY)
Admission: EM | Admit: 2017-06-22 | Discharge: 2017-06-26 | DRG: 291 | Disposition: A | Discharge status: Home / Self Care | Payer: Medicare HMO | Attending: Family Medicine | Admitting: Family Medicine

## 2017-06-22 VITALS — BP 104/60 | HR 60 | Ht 68.0 in | Wt 205.0 lb

## 2017-06-22 DIAGNOSIS — N184 Chronic kidney disease, stage 4 (severe): Secondary | ICD-10-CM | POA: Diagnosis not present

## 2017-06-22 DIAGNOSIS — I5043 Acute on chronic combined systolic (congestive) and diastolic (congestive) heart failure: Secondary | ICD-10-CM | POA: Diagnosis not present

## 2017-06-22 DIAGNOSIS — I208 Other forms of angina pectoris: Secondary | ICD-10-CM | POA: Diagnosis not present

## 2017-06-22 DIAGNOSIS — R0789 Other chest pain: Secondary | ICD-10-CM | POA: Diagnosis present

## 2017-06-22 DIAGNOSIS — R0602 Shortness of breath: Secondary | ICD-10-CM

## 2017-06-22 DIAGNOSIS — R7989 Other specified abnormal findings of blood chemistry: Secondary | ICD-10-CM | POA: Diagnosis present

## 2017-06-22 DIAGNOSIS — J9620 Acute and chronic respiratory failure, unspecified whether with hypoxia or hypercapnia: Secondary | ICD-10-CM | POA: Diagnosis present

## 2017-06-22 DIAGNOSIS — I5042 Chronic combined systolic (congestive) and diastolic (congestive) heart failure: Secondary | ICD-10-CM

## 2017-06-22 DIAGNOSIS — I25709 Atherosclerosis of coronary artery bypass graft(s), unspecified, with unspecified angina pectoris: Secondary | ICD-10-CM | POA: Diagnosis not present

## 2017-06-22 DIAGNOSIS — N189 Chronic kidney disease, unspecified: Secondary | ICD-10-CM | POA: Diagnosis not present

## 2017-06-22 DIAGNOSIS — Z794 Long term (current) use of insulin: Secondary | ICD-10-CM

## 2017-06-22 DIAGNOSIS — E1122 Type 2 diabetes mellitus with diabetic chronic kidney disease: Secondary | ICD-10-CM | POA: Diagnosis not present

## 2017-06-22 DIAGNOSIS — M1990 Unspecified osteoarthritis, unspecified site: Secondary | ICD-10-CM | POA: Diagnosis present

## 2017-06-22 DIAGNOSIS — I25119 Atherosclerotic heart disease of native coronary artery with unspecified angina pectoris: Secondary | ICD-10-CM | POA: Diagnosis present

## 2017-06-22 DIAGNOSIS — I5033 Acute on chronic diastolic (congestive) heart failure: Secondary | ICD-10-CM | POA: Diagnosis not present

## 2017-06-22 DIAGNOSIS — N179 Acute kidney failure, unspecified: Secondary | ICD-10-CM

## 2017-06-22 DIAGNOSIS — R748 Abnormal levels of other serum enzymes: Secondary | ICD-10-CM | POA: Diagnosis not present

## 2017-06-22 DIAGNOSIS — N183 Chronic kidney disease, stage 3 unspecified: Secondary | ICD-10-CM | POA: Diagnosis present

## 2017-06-22 DIAGNOSIS — E1165 Type 2 diabetes mellitus with hyperglycemia: Secondary | ICD-10-CM | POA: Diagnosis present

## 2017-06-22 DIAGNOSIS — S81802A Unspecified open wound, left lower leg, initial encounter: Secondary | ICD-10-CM | POA: Diagnosis present

## 2017-06-22 DIAGNOSIS — I340 Nonrheumatic mitral (valve) insufficiency: Secondary | ICD-10-CM | POA: Diagnosis not present

## 2017-06-22 DIAGNOSIS — T502X5A Adverse effect of carbonic-anhydrase inhibitors, benzothiadiazides and other diuretics, initial encounter: Secondary | ICD-10-CM | POA: Diagnosis present

## 2017-06-22 DIAGNOSIS — I2510 Atherosclerotic heart disease of native coronary artery without angina pectoris: Secondary | ICD-10-CM

## 2017-06-22 DIAGNOSIS — I4819 Other persistent atrial fibrillation: Secondary | ICD-10-CM

## 2017-06-22 DIAGNOSIS — D72829 Elevated white blood cell count, unspecified: Secondary | ICD-10-CM | POA: Diagnosis present

## 2017-06-22 DIAGNOSIS — L97929 Non-pressure chronic ulcer of unspecified part of left lower leg with unspecified severity: Secondary | ICD-10-CM | POA: Diagnosis not present

## 2017-06-22 DIAGNOSIS — Z79899 Other long term (current) drug therapy: Secondary | ICD-10-CM

## 2017-06-22 DIAGNOSIS — E785 Hyperlipidemia, unspecified: Secondary | ICD-10-CM | POA: Diagnosis present

## 2017-06-22 DIAGNOSIS — E538 Deficiency of other specified B group vitamins: Secondary | ICD-10-CM | POA: Diagnosis present

## 2017-06-22 DIAGNOSIS — I480 Paroxysmal atrial fibrillation: Secondary | ICD-10-CM | POA: Diagnosis not present

## 2017-06-22 DIAGNOSIS — D509 Iron deficiency anemia, unspecified: Secondary | ICD-10-CM | POA: Diagnosis present

## 2017-06-22 DIAGNOSIS — E0821 Diabetes mellitus due to underlying condition with diabetic nephropathy: Secondary | ICD-10-CM | POA: Diagnosis not present

## 2017-06-22 DIAGNOSIS — I70209 Unspecified atherosclerosis of native arteries of extremities, unspecified extremity: Secondary | ICD-10-CM | POA: Diagnosis not present

## 2017-06-22 DIAGNOSIS — E559 Vitamin D deficiency, unspecified: Secondary | ICD-10-CM | POA: Diagnosis present

## 2017-06-22 DIAGNOSIS — E1169 Type 2 diabetes mellitus with other specified complication: Secondary | ICD-10-CM | POA: Diagnosis not present

## 2017-06-22 DIAGNOSIS — I481 Persistent atrial fibrillation: Secondary | ICD-10-CM | POA: Diagnosis present

## 2017-06-22 DIAGNOSIS — E1151 Type 2 diabetes mellitus with diabetic peripheral angiopathy without gangrene: Secondary | ICD-10-CM | POA: Diagnosis present

## 2017-06-22 DIAGNOSIS — J9601 Acute respiratory failure with hypoxia: Secondary | ICD-10-CM | POA: Diagnosis not present

## 2017-06-22 DIAGNOSIS — R609 Edema, unspecified: Secondary | ICD-10-CM | POA: Diagnosis not present

## 2017-06-22 DIAGNOSIS — R778 Other specified abnormalities of plasma proteins: Secondary | ICD-10-CM | POA: Diagnosis present

## 2017-06-22 DIAGNOSIS — I5021 Acute systolic (congestive) heart failure: Secondary | ICD-10-CM | POA: Diagnosis present

## 2017-06-22 DIAGNOSIS — K219 Gastro-esophageal reflux disease without esophagitis: Secondary | ICD-10-CM | POA: Diagnosis present

## 2017-06-22 DIAGNOSIS — I248 Other forms of acute ischemic heart disease: Secondary | ICD-10-CM | POA: Diagnosis not present

## 2017-06-22 DIAGNOSIS — Z87891 Personal history of nicotine dependence: Secondary | ICD-10-CM

## 2017-06-22 DIAGNOSIS — I482 Chronic atrial fibrillation: Secondary | ICD-10-CM | POA: Diagnosis not present

## 2017-06-22 DIAGNOSIS — I130 Hypertensive heart and chronic kidney disease with heart failure and stage 1 through stage 4 chronic kidney disease, or unspecified chronic kidney disease: Principal | ICD-10-CM | POA: Diagnosis present

## 2017-06-22 DIAGNOSIS — I25700 Atherosclerosis of coronary artery bypass graft(s), unspecified, with unstable angina pectoris: Secondary | ICD-10-CM

## 2017-06-22 DIAGNOSIS — I5040 Unspecified combined systolic (congestive) and diastolic (congestive) heart failure: Secondary | ICD-10-CM | POA: Diagnosis not present

## 2017-06-22 DIAGNOSIS — Z951 Presence of aortocoronary bypass graft: Secondary | ICD-10-CM

## 2017-06-22 DIAGNOSIS — I129 Hypertensive chronic kidney disease with stage 1 through stage 4 chronic kidney disease, or unspecified chronic kidney disease: Secondary | ICD-10-CM | POA: Diagnosis not present

## 2017-06-22 DIAGNOSIS — Z66 Do not resuscitate: Secondary | ICD-10-CM | POA: Diagnosis present

## 2017-06-22 DIAGNOSIS — N281 Cyst of kidney, acquired: Secondary | ICD-10-CM | POA: Diagnosis not present

## 2017-06-22 DIAGNOSIS — J9600 Acute respiratory failure, unspecified whether with hypoxia or hypercapnia: Secondary | ICD-10-CM | POA: Diagnosis present

## 2017-06-22 DIAGNOSIS — Z7901 Long term (current) use of anticoagulants: Secondary | ICD-10-CM

## 2017-06-22 DIAGNOSIS — R079 Chest pain, unspecified: Secondary | ICD-10-CM | POA: Diagnosis not present

## 2017-06-22 DIAGNOSIS — I509 Heart failure, unspecified: Secondary | ICD-10-CM

## 2017-06-22 DIAGNOSIS — I351 Nonrheumatic aortic (valve) insufficiency: Secondary | ICD-10-CM | POA: Diagnosis not present

## 2017-06-22 DIAGNOSIS — J90 Pleural effusion, not elsewhere classified: Secondary | ICD-10-CM | POA: Diagnosis not present

## 2017-06-22 LAB — LIPASE, BLOOD: LIPASE: 21 U/L (ref 11–51)

## 2017-06-22 LAB — COMPREHENSIVE METABOLIC PANEL
ALT: 26 U/L (ref 17–63)
ANION GAP: 9 (ref 5–15)
AST: 27 U/L (ref 15–41)
Albumin: 3.2 g/dL — ABNORMAL LOW (ref 3.5–5.0)
Alkaline Phosphatase: 70 U/L (ref 38–126)
BILIRUBIN TOTAL: 1.4 mg/dL — AB (ref 0.3–1.2)
BUN: 49 mg/dL — AB (ref 6–20)
CALCIUM: 8.9 mg/dL (ref 8.9–10.3)
CO2: 21 mmol/L — ABNORMAL LOW (ref 22–32)
Chloride: 106 mmol/L (ref 101–111)
Creatinine, Ser: 2.21 mg/dL — ABNORMAL HIGH (ref 0.61–1.24)
GFR calc Af Amer: 28 mL/min — ABNORMAL LOW (ref >60)
GFR, EST NON AFRICAN AMERICAN: 24 mL/min — AB (ref >60)
Glucose, Bld: 211 mg/dL — ABNORMAL HIGH (ref 65–99)
Potassium: 4.9 mmol/L (ref 3.5–5.1)
Sodium: 136 mmol/L (ref 135–145)
TOTAL PROTEIN: 7 g/dL (ref 6.5–8.1)

## 2017-06-22 LAB — TROPONIN I: Troponin I: 0.2 ng/mL (ref <0.03)

## 2017-06-22 LAB — URINALYSIS, ROUTINE W REFLEX MICROSCOPIC
Bacteria, UA: NONE SEEN
Bilirubin Urine: NEGATIVE
GLUCOSE, UA: 150 mg/dL — AB
Hgb urine dipstick: NEGATIVE
KETONES UR: NEGATIVE mg/dL
Leukocytes, UA: NEGATIVE
Nitrite: NEGATIVE
PH: 5 (ref 5.0–8.0)
Protein, ur: 30 mg/dL — AB
SPECIFIC GRAVITY, URINE: 1.016 (ref 1.005–1.030)

## 2017-06-22 LAB — I-STAT TROPONIN, ED: Troponin i, poc: 0.21 ng/mL (ref 0.00–0.08)

## 2017-06-22 LAB — OSMOLALITY, URINE: OSMOLALITY UR: 536 mosm/kg (ref 300–900)

## 2017-06-22 LAB — PROTIME-INR
INR: 1.11
Prothrombin Time: 14.3 seconds (ref 11.4–15.2)

## 2017-06-22 LAB — CBC
HEMATOCRIT: 27.6 % — AB (ref 39.0–52.0)
Hemoglobin: 9 g/dL — ABNORMAL LOW (ref 13.0–17.0)
MCH: 28.8 pg (ref 26.0–34.0)
MCHC: 32.6 g/dL (ref 30.0–36.0)
MCV: 88.2 fL (ref 78.0–100.0)
Platelets: 199 10*3/uL (ref 150–400)
RBC: 3.13 MIL/uL — ABNORMAL LOW (ref 4.22–5.81)
RDW: 15.1 % (ref 11.5–15.5)
WBC: 14 10*3/uL — ABNORMAL HIGH (ref 4.0–10.5)

## 2017-06-22 LAB — SODIUM, URINE, RANDOM: SODIUM UR: 22 mmol/L

## 2017-06-22 LAB — CREATININE, URINE, RANDOM: Creatinine, Urine: 85.32 mg/dL

## 2017-06-22 LAB — D-DIMER, QUANTITATIVE (NOT AT ARMC): D DIMER QUANT: 2.06 ug{FEU}/mL — AB (ref 0.00–0.50)

## 2017-06-22 MED ORDER — SENNOSIDES-DOCUSATE SODIUM 8.6-50 MG PO TABS
2.0000 | ORAL_TABLET | Freq: Every day | ORAL | Status: DC
  Administered 2017-06-23 – 2017-06-25 (×3): 2 via ORAL
  Filled 2017-06-22 (×3): qty 2

## 2017-06-22 MED ORDER — METOPROLOL TARTRATE 50 MG PO TABS
50.0000 mg | ORAL_TABLET | Freq: Two times a day (BID) | ORAL | Status: DC
  Administered 2017-06-23 – 2017-06-26 (×8): 50 mg via ORAL
  Filled 2017-06-22 (×8): qty 1

## 2017-06-22 MED ORDER — INSULIN ASPART 100 UNIT/ML ~~LOC~~ SOLN
0.0000 [IU] | Freq: Three times a day (TID) | SUBCUTANEOUS | Status: DC
  Administered 2017-06-23 – 2017-06-24 (×2): 2 [IU] via SUBCUTANEOUS
  Administered 2017-06-24 (×2): 1 [IU] via SUBCUTANEOUS
  Administered 2017-06-25 (×2): 2 [IU] via SUBCUTANEOUS

## 2017-06-22 MED ORDER — ONDANSETRON HCL 4 MG/2ML IJ SOLN: 4.0000 mg | Freq: Four times a day (QID) | INTRAMUSCULAR | Status: DC | PRN

## 2017-06-22 MED ORDER — ISOSORBIDE MONONITRATE ER 30 MG PO TB24
30.0000 mg | ORAL_TABLET | Freq: Every day | ORAL | Status: DC
  Administered 2017-06-23 – 2017-06-26 (×4): 30 mg via ORAL
  Filled 2017-06-22 (×5): qty 1

## 2017-06-22 MED ORDER — LORATADINE 10 MG PO TABS
10.0000 mg | ORAL_TABLET | Freq: Every day | ORAL | Status: DC
Start: 2017-06-23 — End: 2017-06-26
  Administered 2017-06-23 – 2017-06-26 (×4): 10 mg via ORAL
  Filled 2017-06-22 (×4): qty 1

## 2017-06-22 MED ORDER — ALBUTEROL SULFATE (2.5 MG/3ML) 0.083% IN NEBU: 2.5000 mg | INHALATION_SOLUTION | RESPIRATORY_TRACT | Status: DC | PRN

## 2017-06-22 MED ORDER — SODIUM CHLORIDE 0.9% FLUSH: 3.0000 mL | INTRAVENOUS | Status: DC | PRN

## 2017-06-22 MED ORDER — HEPARIN BOLUS VIA INFUSION
4000.0000 [IU] | Freq: Once | INTRAVENOUS | Status: AC
  Administered 2017-06-22: 4000 [IU] via INTRAVENOUS
  Filled 2017-06-22: qty 4000

## 2017-06-22 MED ORDER — INSULIN ASPART 100 UNIT/ML ~~LOC~~ SOLN: 0.0000 [IU] | Freq: Every day | SUBCUTANEOUS | Status: DC

## 2017-06-22 MED ORDER — SODIUM CHLORIDE 0.9 % IV SOLN: 250.0000 mL | INTRAVENOUS | Status: DC | PRN

## 2017-06-22 MED ORDER — HYDRALAZINE HCL 10 MG PO TABS
10.0000 mg | ORAL_TABLET | Freq: Two times a day (BID) | ORAL | Status: DC
  Administered 2017-06-23 (×2): 10 mg via ORAL
  Filled 2017-06-22: qty 1

## 2017-06-22 MED ORDER — INSULIN GLARGINE 100 UNIT/ML ~~LOC~~ SOLN
30.0000 [IU] | Freq: Every day | SUBCUTANEOUS | Status: DC
  Administered 2017-06-24 – 2017-06-25 (×3): 30 [IU] via SUBCUTANEOUS
  Filled 2017-06-22 (×5): qty 0.3

## 2017-06-22 MED ORDER — ACETAMINOPHEN 325 MG PO TABS: 650.0000 mg | ORAL_TABLET | Freq: Four times a day (QID) | ORAL | Status: DC | PRN

## 2017-06-22 MED ORDER — BISACODYL 10 MG RE SUPP: 10.0000 mg | Freq: Once | RECTAL | Status: DC | PRN

## 2017-06-22 MED ORDER — PANTOPRAZOLE SODIUM 40 MG PO TBEC
40.0000 mg | DELAYED_RELEASE_TABLET | Freq: Two times a day (BID) | ORAL | Status: DC
  Administered 2017-06-23 – 2017-06-26 (×8): 40 mg via ORAL
  Filled 2017-06-22 (×9): qty 1

## 2017-06-22 MED ORDER — HEPARIN (PORCINE) IN NACL 100-0.45 UNIT/ML-% IJ SOLN
1500.0000 [IU]/h | INTRAMUSCULAR | Status: DC
  Administered 2017-06-22: 1100 [IU]/h via INTRAVENOUS
  Administered 2017-06-24: 1600 [IU]/h via INTRAVENOUS
  Filled 2017-06-22 (×3): qty 250

## 2017-06-22 MED ORDER — ONDANSETRON HCL 4 MG PO TABS: 4.0000 mg | ORAL_TABLET | Freq: Four times a day (QID) | ORAL | Status: DC | PRN

## 2017-06-22 MED ORDER — IPRATROPIUM-ALBUTEROL 0.5-2.5 (3) MG/3ML IN SOLN: 3.0000 mL | Freq: Four times a day (QID) | RESPIRATORY_TRACT | Status: DC

## 2017-06-22 MED ORDER — DOCUSATE SODIUM 100 MG PO CAPS
100.0000 mg | ORAL_CAPSULE | Freq: Every day | ORAL | Status: DC
  Administered 2017-06-23 – 2017-06-26 (×4): 100 mg via ORAL
  Filled 2017-06-22 (×4): qty 1

## 2017-06-22 MED ORDER — ACETAMINOPHEN 650 MG RE SUPP: 650.0000 mg | Freq: Four times a day (QID) | RECTAL | Status: DC | PRN

## 2017-06-22 MED ORDER — ASPIRIN EC 81 MG PO TBEC
81.0000 mg | DELAYED_RELEASE_TABLET | Freq: Every day | ORAL | Status: DC
  Administered 2017-06-23 – 2017-06-26 (×4): 81 mg via ORAL
  Filled 2017-06-22 (×4): qty 1

## 2017-06-22 MED ORDER — FUROSEMIDE 10 MG/ML IJ SOLN
40.0000 mg | Freq: Once | INTRAMUSCULAR | Status: AC
  Administered 2017-06-22: 40 mg via INTRAVENOUS
  Filled 2017-06-22: qty 4

## 2017-06-22 MED ORDER — SODIUM CHLORIDE 0.9% FLUSH
3.0000 mL | Freq: Two times a day (BID) | INTRAVENOUS | Status: DC
  Administered 2017-06-24 – 2017-06-25 (×3): 3 mL via INTRAVENOUS

## 2017-06-22 MED ORDER — FUROSEMIDE 10 MG/ML IJ SOLN
40.0000 mg | Freq: Two times a day (BID) | INTRAMUSCULAR | Status: DC
  Administered 2017-06-23: 40 mg via INTRAVENOUS
  Filled 2017-06-22: qty 4

## 2017-06-22 NOTE — Progress Notes (Signed)
ANTICOAGULATION CONSULT NOTE - Initial Consult  Pharmacy Consult for heparin Indication: atrial fibrillation  No Known Allergies  Patient Measurements:   Heparin Dosing Weight: 87.7kg  Vital Signs: Temp: 97.6 F (36.4 C) (08/07 1543) BP: 158/64 (08/07 2045) Pulse Rate: 71 (08/07 2045)  Labs:  Recent Labs  06/22/17 1608 06/22/17 1936  HGB 9.0*  --   HCT 27.6*  --   PLT 199  --   LABPROT 14.3  --   INR 1.11  --   CREATININE 2.21*  --   TROPONINI  --  0.20*    Estimated Creatinine Clearance: 22.6 mL/min (A) (by C-G formula based on SCr of 2.21 mg/dL (H)).   Medical History: Past Medical History:  Diagnosis Date  . Actinic keratitis   . CHF (congestive heart failure) ([place])   . Claudication ([place])   . Coronary artery disease    status post CABG, 1999  . Diabetes mellitus    Uncontrolled secondary to dietary noncompliance  . Diverticulosis   . DJD (degenerative joint disease)   . Hypertension    hypertensive syndrome with dyspnea -[facility], February, 2011 - EF 50% and cardiac bypass grafts all patent - responded to diuretics and blood pressure control - [name]  . Microscopic hematuria   . Onychomycosis   . Pneumonia 05/2017  . Prostate nodule   . PSVT (paroxysmal supraventricular tachycardia) ([place])   . Renal disorder   . Vitamin B12 deficiency   . Vitamin D deficiency     Medications:  Infusions:  . heparin      Assessment: 23 yom presented to the ED with worsening SOB and weight gain. Baseline H/H is slightly low and platelets are WNL. D-dimer and troponin are elevated. He is not on anticoagulation PTA as he was not thought to be a candidate.   Goal of Therapy:  Heparin level 0.3-0.7 units/ml Monitor platelets by anticoagulation protocol: Yes   Plan:  Heparin bolus 4000 units IV x 1 Heparin gtt 1100 units/hr Check an 8 hr heparin level Daily heparin level and CBC F/u long-term AC plans  Kazumi Lachney, Rande Lawman 06/22/2017,9:11 PM

## 2017-06-22 NOTE — Addendum Note (Signed)
Addended by: Burtis Junes on: 06/22/2017 03:20 PM   Modules accepted: Miquel Dunn

## 2017-06-22 NOTE — ED Notes (Signed)
300mL urine output
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
## 2017-06-22 NOTE — ED Notes (Signed)
Urine output: 47mL
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
## 2017-06-22 NOTE — Patient Instructions (Addendum)
We are admitting you to [facility] today.

## 2017-06-22 NOTE — ED Notes (Signed)
PT taken off Bipap-placed on 2liters [place]

## 2017-06-22 NOTE — ED Triage Notes (Signed)
PResents from cardiologist office, currently living at [facility]. Pt taken off fluid pill due to recent renal failure, worsesning SOB over past 2-3 days and 20 pound weight gain in one week. Bilateral lower leg edema and abdominal edema. Endorses chest pain and nausaea.
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
## 2017-06-23 ENCOUNTER — Inpatient Hospital Stay (HOSPITAL_COMMUNITY): Payer: Medicare HMO

## 2017-06-23 DIAGNOSIS — R079 Chest pain, unspecified: Secondary | ICD-10-CM

## 2017-06-23 DIAGNOSIS — J9601 Acute respiratory failure with hypoxia: Secondary | ICD-10-CM

## 2017-06-23 DIAGNOSIS — R748 Abnormal levels of other serum enzymes: Secondary | ICD-10-CM

## 2017-06-23 DIAGNOSIS — I25709 Atherosclerosis of coronary artery bypass graft(s), unspecified, with unspecified angina pectoris: Secondary | ICD-10-CM

## 2017-06-23 DIAGNOSIS — I5021 Acute systolic (congestive) heart failure: Secondary | ICD-10-CM

## 2017-06-23 DIAGNOSIS — I5042 Chronic combined systolic (congestive) and diastolic (congestive) heart failure: Secondary | ICD-10-CM

## 2017-06-23 DIAGNOSIS — I70209 Unspecified atherosclerosis of native arteries of extremities, unspecified extremity: Secondary | ICD-10-CM

## 2017-06-23 DIAGNOSIS — E1151 Type 2 diabetes mellitus with diabetic peripheral angiopathy without gangrene: Secondary | ICD-10-CM

## 2017-06-23 LAB — CBC WITH DIFFERENTIAL/PLATELET
Basophils Absolute: 0 10*3/uL (ref 0.0–0.1)
Basophils Relative: 0 %
Eosinophils Absolute: 0.2 10*3/uL (ref 0.0–0.7)
Eosinophils Relative: 2 %
HCT: 28.1 % — ABNORMAL LOW (ref 39.0–52.0)
Hemoglobin: 9.4 g/dL — ABNORMAL LOW (ref 13.0–17.0)
Lymphocytes Relative: 9 %
Lymphs Abs: 0.9 10*3/uL (ref 0.7–4.0)
MCH: 29.3 pg (ref 26.0–34.0)
MCHC: 33.5 g/dL (ref 30.0–36.0)
MCV: 87.5 fL (ref 78.0–100.0)
Monocytes Absolute: 1.1 10*3/uL — ABNORMAL HIGH (ref 0.1–1.0)
Monocytes Relative: 12 %
Neutro Abs: 7.3 10*3/uL (ref 1.7–7.7)
Neutrophils Relative %: 77 %
Platelets: 177 10*3/uL (ref 150–400)
RBC: 3.21 MIL/uL — ABNORMAL LOW (ref 4.22–5.81)
RDW: 14.9 % (ref 11.5–15.5)
WBC: 9.5 10*3/uL (ref 4.0–10.5)

## 2017-06-23 LAB — COMPREHENSIVE METABOLIC PANEL
ALBUMIN: 3 g/dL — AB (ref 3.5–5.0)
ALK PHOS: 65 U/L (ref 38–126)
ALT: 23 U/L (ref 17–63)
ALT: 25 U/L (ref 17–63)
AST: 23 U/L (ref 15–41)
AST: 22 U/L (ref 15–41)
Albumin: 2.9 g/dL — ABNORMAL LOW (ref 3.5–5.0)
Alkaline Phosphatase: 72 U/L (ref 38–126)
Anion gap: 9 (ref 5–15)
Anion gap: 11 (ref 5–15)
BILIRUBIN TOTAL: 1.5 mg/dL — AB (ref 0.3–1.2)
BUN: 45 mg/dL — AB (ref 6–20)
BUN: 41 mg/dL — ABNORMAL HIGH (ref 6–20)
CALCIUM: 8.8 mg/dL — AB (ref 8.9–10.3)
CO2: 20 mmol/L — ABNORMAL LOW (ref 22–32)
CO2: 21 mmol/L — ABNORMAL LOW (ref 22–32)
Calcium: 8.6 mg/dL — ABNORMAL LOW (ref 8.9–10.3)
Chloride: 108 mmol/L (ref 101–111)
Chloride: 106 mmol/L (ref 101–111)
Creatinine, Ser: 2.14 mg/dL — ABNORMAL HIGH (ref 0.61–1.24)
Creatinine, Ser: 2.11 mg/dL — ABNORMAL HIGH (ref 0.61–1.24)
GFR calc Af Amer: 29 mL/min — ABNORMAL LOW (ref >60)
GFR calc Af Amer: 29 mL/min — ABNORMAL LOW (ref >60)
GFR calc non Af Amer: 25 mL/min — ABNORMAL LOW (ref >60)
GFR, EST NON AFRICAN AMERICAN: 25 mL/min — AB (ref >60)
GLUCOSE: 150 mg/dL — AB (ref 65–99)
Glucose, Bld: 195 mg/dL — ABNORMAL HIGH (ref 65–99)
POTASSIUM: 3.8 mmol/L (ref 3.5–5.1)
Potassium: 4 mmol/L (ref 3.5–5.1)
Sodium: 137 mmol/L (ref 135–145)
Sodium: 138 mmol/L (ref 135–145)
TOTAL PROTEIN: 6.4 g/dL — AB (ref 6.5–8.1)
Total Bilirubin: 1.3 mg/dL — ABNORMAL HIGH (ref 0.3–1.2)
Total Protein: 6.6 g/dL (ref 6.5–8.1)

## 2017-06-23 LAB — MAGNESIUM
MAGNESIUM: 1.9 mg/dL (ref 1.7–2.4)
Magnesium: 1.8 mg/dL (ref 1.7–2.4)

## 2017-06-23 LAB — GLUCOSE, CAPILLARY
GLUCOSE-CAPILLARY: 138 mg/dL — AB (ref 65–99)
GLUCOSE-CAPILLARY: 228 mg/dL — AB (ref 65–99)
Glucose-Capillary: 116 mg/dL — ABNORMAL HIGH (ref 65–99)
Glucose-Capillary: 116 mg/dL — ABNORMAL HIGH (ref 65–99)
Glucose-Capillary: 171 mg/dL — ABNORMAL HIGH (ref 65–99)

## 2017-06-23 LAB — CBC
HEMATOCRIT: 28.6 % — AB (ref 39.0–52.0)
HEMATOCRIT: 26.8 % — AB (ref 39.0–52.0)
Hemoglobin: 9.4 g/dL — ABNORMAL LOW (ref 13.0–17.0)
Hemoglobin: 8.8 g/dL — ABNORMAL LOW (ref 13.0–17.0)
MCH: 28.6 pg (ref 26.0–34.0)
MCH: 28.8 pg (ref 26.0–34.0)
MCHC: 32.9 g/dL (ref 30.0–36.0)
MCHC: 32.8 g/dL (ref 30.0–36.0)
MCV: 86.9 fL (ref 78.0–100.0)
MCV: 87.6 fL (ref 78.0–100.0)
PLATELETS: 180 10*3/uL (ref 150–400)
Platelets: 168 10*3/uL (ref 150–400)
RBC: 3.29 MIL/uL — ABNORMAL LOW (ref 4.22–5.81)
RBC: 3.06 MIL/uL — ABNORMAL LOW (ref 4.22–5.81)
RDW: 15 % (ref 11.5–15.5)
RDW: 15 % (ref 11.5–15.5)
WBC: 9.2 10*3/uL (ref 4.0–10.5)
WBC: 7.6 10*3/uL (ref 4.0–10.5)

## 2017-06-23 LAB — TROPONIN I
TROPONIN I: 0.25 ng/mL — AB (ref <0.03)
Troponin I: 0.22 ng/mL (ref <0.03)
Troponin I: 0.16 ng/mL (ref <0.03)
Troponin I: 0.13 ng/mL (ref <0.03)

## 2017-06-23 LAB — HEPARIN LEVEL (UNFRACTIONATED)
HEPARIN UNFRACTIONATED: 0.13 [IU]/mL — AB (ref 0.30–0.70)
Heparin Unfractionated: 0.2 IU/mL — ABNORMAL LOW (ref 0.30–0.70)

## 2017-06-23 LAB — PHOSPHORUS: PHOSPHORUS: 4 mg/dL (ref 2.5–4.6)

## 2017-06-23 LAB — HEMOGLOBIN A1C
HEMOGLOBIN A1C: 9.5 % — AB (ref 4.8–5.6)
Mean Plasma Glucose: 225.95 mg/dL

## 2017-06-23 LAB — IRON AND TIBC
IRON: 17 ug/dL — AB (ref 45–182)
Saturation Ratios: 7 % — ABNORMAL LOW (ref 17.9–39.5)
TIBC: 252 ug/dL (ref 250–450)
UIBC: 235 ug/dL

## 2017-06-23 LAB — BRAIN NATRIURETIC PEPTIDE
B Natriuretic Peptide: 1149.9 pg/mL — ABNORMAL HIGH (ref 0.0–100.0)
B Natriuretic Peptide: 1038.2 pg/mL — ABNORMAL HIGH (ref 0.0–100.0)

## 2017-06-23 LAB — FOLATE: FOLATE: 20.1 ng/mL (ref >5.9)

## 2017-06-23 LAB — RETICULOCYTES
RBC.: 3.29 MIL/uL — AB (ref 4.22–5.81)
RETIC COUNT ABSOLUTE: 46.1 10*3/uL (ref 19.0–186.0)
RETIC CT PCT: 1.4 % (ref 0.4–3.1)

## 2017-06-23 LAB — VITAMIN B12: Vitamin B-12: 868 pg/mL (ref 180–914)

## 2017-06-23 LAB — TSH
TSH: 2.341 u[IU]/mL (ref 0.350–4.500)
TSH: 1.266 u[IU]/mL (ref 0.350–4.500)

## 2017-06-23 LAB — FERRITIN: Ferritin: 168 ng/mL (ref 24–336)

## 2017-06-23 MED ORDER — SODIUM CHLORIDE 0.9 % IV SOLN
510.0000 mg | Freq: Once | INTRAVENOUS | Status: AC
  Administered 2017-06-23: 510 mg via INTRAVENOUS
  Filled 2017-06-23: qty 17

## 2017-06-23 MED ORDER — HYDRALAZINE HCL 10 MG PO TABS
ORAL_TABLET | ORAL | Status: AC
  Filled 2017-06-23: qty 1

## 2017-06-23 MED ORDER — IPRATROPIUM-ALBUTEROL 0.5-2.5 (3) MG/3ML IN SOLN: 3.0000 mL | Freq: Four times a day (QID) | RESPIRATORY_TRACT | Status: DC | PRN

## 2017-06-23 MED ORDER — ISOSORBIDE MONONITRATE ER 30 MG PO TB24: 30.0000 mg | ORAL_TABLET | Freq: Every day | ORAL | Status: DC

## 2017-06-23 MED ORDER — ONDANSETRON HCL 4 MG/2ML IJ SOLN
4.0000 mg | Freq: Four times a day (QID) | INTRAMUSCULAR | Status: DC | PRN
Start: 2017-06-23 — End: 2017-06-23

## 2017-06-23 MED ORDER — FUROSEMIDE 10 MG/ML IJ SOLN
60.0000 mg | Freq: Two times a day (BID) | INTRAMUSCULAR | Status: DC
Start: 2017-06-23 — End: 2017-06-24
  Administered 2017-06-24: 60 mg via INTRAVENOUS
  Filled 2017-06-23 (×2): qty 6

## 2017-06-23 MED ORDER — LATANOPROST 0.005 % OP SOLN
1.0000 [drp] | Freq: Every day | OPHTHALMIC | Status: DC
  Administered 2017-06-24 – 2017-06-25 (×3): 1 [drp] via OPHTHALMIC
  Filled 2017-06-23: qty 2.5

## 2017-06-23 MED ORDER — METOPROLOL TARTRATE 50 MG PO TABS
50.0000 mg | ORAL_TABLET | Freq: Two times a day (BID) | ORAL | Status: DC
Start: 2017-06-23 — End: 2017-06-23

## 2017-06-23 MED ORDER — LORATADINE 10 MG PO TABS: 10.0000 mg | ORAL_TABLET | Freq: Every day | ORAL | Status: DC

## 2017-06-23 MED ORDER — TECHNETIUM TC 99M DIETHYLENETRIAME-PENTAACETIC ACID: 30.0000 | Freq: Once | INTRAVENOUS | Status: DC | PRN

## 2017-06-23 MED ORDER — SENNOSIDES-DOCUSATE SODIUM 8.6-50 MG PO TABS
ORAL_TABLET | ORAL | Status: AC
  Filled 2017-06-23: qty 2

## 2017-06-23 MED ORDER — VITAMIN B-12 1000 MCG PO TABS
1000.0000 ug | ORAL_TABLET | Freq: Every day | ORAL | Status: DC
  Administered 2017-06-24 – 2017-06-26 (×3): 1000 ug via ORAL
  Filled 2017-06-23 (×3): qty 1

## 2017-06-23 MED ORDER — TAMSULOSIN HCL 0.4 MG PO CAPS
0.4000 mg | ORAL_CAPSULE | Freq: Every day | ORAL | Status: DC
  Administered 2017-06-23 – 2017-06-25 (×3): 0.4 mg via ORAL
  Filled 2017-06-23 (×3): qty 1

## 2017-06-23 MED ORDER — SODIUM CHLORIDE 0.9% FLUSH: 3.0000 mL | INTRAVENOUS | Status: DC | PRN

## 2017-06-23 MED ORDER — ACETAMINOPHEN 325 MG PO TABS: 650.0000 mg | ORAL_TABLET | ORAL | Status: DC | PRN

## 2017-06-23 MED ORDER — ADULT MULTIVITAMIN W/MINERALS CH
1.0000 | ORAL_TABLET | Freq: Every day | ORAL | Status: DC
  Administered 2017-06-24 – 2017-06-26 (×3): 1 via ORAL
  Filled 2017-06-23 (×3): qty 1

## 2017-06-23 MED ORDER — ROSUVASTATIN CALCIUM 10 MG PO TABS
5.0000 mg | ORAL_TABLET | ORAL | Status: DC
  Administered 2017-06-23 – 2017-06-25 (×2): 5 mg via ORAL
  Filled 2017-06-23 (×2): qty 1

## 2017-06-23 MED ORDER — POTASSIUM CHLORIDE CRYS ER 20 MEQ PO TBCR
20.0000 meq | EXTENDED_RELEASE_TABLET | Freq: Two times a day (BID) | ORAL | Status: DC
Start: 2017-06-23 — End: 2017-06-26
  Administered 2017-06-23 – 2017-06-26 (×6): 20 meq via ORAL
  Filled 2017-06-23 (×6): qty 1

## 2017-06-23 MED ORDER — PANTOPRAZOLE SODIUM 40 MG PO TBEC: 40.0000 mg | DELAYED_RELEASE_TABLET | Freq: Two times a day (BID) | ORAL | Status: DC

## 2017-06-23 MED ORDER — VITAMIN D 1000 UNITS PO TABS
1000.0000 [IU] | ORAL_TABLET | Freq: Every day | ORAL | Status: DC
  Administered 2017-06-24 – 2017-06-26 (×3): 1000 [IU] via ORAL
  Filled 2017-06-23 (×3): qty 1

## 2017-06-23 MED ORDER — DOCUSATE SODIUM 100 MG PO CAPS: 100.0000 mg | ORAL_CAPSULE | Freq: Every day | ORAL | Status: DC | PRN

## 2017-06-23 MED ORDER — SODIUM CHLORIDE 0.9% FLUSH
3.0000 mL | Freq: Two times a day (BID) | INTRAVENOUS | Status: DC
  Administered 2017-06-23 – 2017-06-25 (×5): 3 mL via INTRAVENOUS

## 2017-06-23 MED ORDER — HYDRALAZINE HCL 10 MG PO TABS
10.0000 mg | ORAL_TABLET | Freq: Two times a day (BID) | ORAL | Status: DC
  Administered 2017-06-23 – 2017-06-26 (×6): 10 mg via ORAL
  Filled 2017-06-23 (×6): qty 1

## 2017-06-23 MED ORDER — INSULIN GLARGINE 100 UNIT/ML ~~LOC~~ SOLN: 30.0000 [IU] | Freq: Every day | SUBCUTANEOUS | Status: DC

## 2017-06-23 MED ORDER — TECHNETIUM TO 99M ALBUMIN AGGREGATED
4.0000 | Freq: Once | INTRAVENOUS | Status: AC | PRN
  Administered 2017-06-23: 4 via INTRAVENOUS

## 2017-06-23 MED ORDER — SODIUM CHLORIDE 0.9 % IV SOLN: 250.0000 mL | INTRAVENOUS | Status: DC | PRN

## 2017-06-23 MED ORDER — IPRATROPIUM-ALBUTEROL 0.5-2.5 (3) MG/3ML IN SOLN
3.0000 mL | Freq: Three times a day (TID) | RESPIRATORY_TRACT | Status: DC
  Administered 2017-06-23 (×3): 3 mL via RESPIRATORY_TRACT
  Filled 2017-06-23 (×4): qty 3
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
## 2017-06-23 NOTE — Progress Notes (Signed)
ANTICOAGULATION CONSULT NOTE - Initial Consult  Pharmacy Consult for heparin Indication: atrial fibrillation  No Known Allergies  Patient Measurements: Height: 5\' 8"  (172.7 cm) Weight: 198 lb 3.2 oz (89.9 kg) IBW/kg (Calculated) : 68.4 Heparin Dosing Weight: 87.7kg  Vital Signs: Temp: 99.1 F (37.3 C) (08/08 0421) Temp Source: Oral (08/08 0421) BP: 127/50 (08/08 0421) Pulse Rate: 61 (08/08 0421)  Labs:  Recent Labs  06/22/17 1608 06/22/17 1936 06/23/17 0108 06/23/17 0614  HGB 9.0*  --  9.4* 8.8*  HCT 27.6*  --  28.6* 26.8*  PLT 199  --  180 168  LABPROT 14.3  --   --   --   INR 1.11  --   --   --   HEPARINUNFRC  --   --   --  0.13*  CREATININE 2.21*  --  2.14*  --   TROPONINI  --  0.20* 0.25*  --     Estimated Creatinine Clearance: 23 mL/min (A) (by C-G formula based on SCr of 2.14 mg/dL (H)).   Medical History: Past Medical History:  Diagnosis Date  . Actinic keratitis   . CHF (congestive heart failure) ([place])   . Claudication ([place])   . Coronary artery disease    status post CABG, 1999  . Diabetes mellitus    Uncontrolled secondary to dietary noncompliance  . Diverticulosis   . DJD (degenerative joint disease)   . Hypertension    hypertensive syndrome with dyspnea -[facility], February, 2011 - EF 50% and cardiac bypass grafts all patent - responded to diuretics and blood pressure control - [name]  . Microscopic hematuria   . Onychomycosis   . Pneumonia 05/2017  . Prostate nodule   . PSVT (paroxysmal supraventricular tachycardia) ([place])   . Renal disorder   . Vitamin B12 deficiency   . Vitamin D deficiency     Assessment: 70 yom presented to the ED with worsening SOB and weight gain. He is not on anticoagulation PTA as he was not thought to be a candidate. Heparin level is subtherapeutic at 0.13. RN reports no issues or interruptions with heparin infusion overnight. CBC is low-stable and no s/s bleeding noted.   Goal of  Therapy:  Heparin level 0.3-0.7 units/ml Monitor platelets by anticoagulation protocol: Yes   Plan:  Increase heparin gtt to 1350 units/hr Check an 8 hr heparin level Daily heparin level and CBC F/u long-term AC plans  Argie Ramming, PharmD Clinical Pharmacist 06/23/17 7:02 AM

## 2017-06-23 NOTE — Consult Note (Signed)
Cardiology Consultation:   Patient ID: Cole [name]; [id number]; Aug 24, 1920   Admit date: 06/22/2017 Date of Consult: 06/23/2017  Primary Care Provider: [name], MD Primary Cardiologist: Dr. Tamala Julian Primary Electrophysiologist:     Patient Profile:   Cole [name] is a 83 y.o. male with a hx of CKD stage III, PAF, CAD s/p CABG, chronic diastolic heart failure, HTN, and DM who is being seen today for the evaluation of acute on chronic diastolic heart failure at the request of Dr. Darrick Meigs.  History of Present Illness:   Cole [name] was seen yesterday in cardiology clinic with worsening shortness of breath and lower extremity edema. He saw nephrology last week during a hospitalization (06/11/17 - 06/13/17) and was taken off diuretics for rising sCr. In clinic, his weight was up 14 lbs in one week with tight abdomen and shortness of breath with just conversation. The decision was made to transfer him to [facility] for further evaluation and treatment with IV diuretics. It is unclear why he had worsening kidney function.   On arrival, sCr was 2.21, with baseline of 1.5 earlier this year. He was started on lasix 40 mg IV BID. D-dimer was positive, VQ scan pending. On exam today, patient is breathing better with clear lung sounds. However, he continues to have significant LE edema.   Past Medical History:  Diagnosis Date  . Actinic keratitis   . CHF (congestive heart failure) ([place])   . Claudication ([place])   . Coronary artery disease    status post CABG, 1999  . Diabetes mellitus    Uncontrolled secondary to dietary noncompliance  . Diverticulosis   . DJD (degenerative joint disease)   . Hypertension    hypertensive syndrome with dyspnea -[facility], February, 2011 - EF 50% and cardiac bypass grafts all patent - responded to diuretics and blood pressure control - [name]  . Microscopic hematuria   . Onychomycosis   . Pneumonia 05/2017  . Prostate nodule   . PSVT  (paroxysmal supraventricular tachycardia) ([place])   . Renal disorder   . Vitamin B12 deficiency   . Vitamin D deficiency     Past Surgical History:  Procedure Laterality Date  . APPENDECTOMY    . CORONARY ARTERY BYPASS GRAFT       Inpatient Medications: Scheduled Meds: . aspirin EC  81 mg Oral Daily  . docusate sodium  100 mg Oral Daily  . furosemide  40 mg Intravenous Q12H  . hydrALAZINE  10 mg Oral BID  . insulin aspart  0-5 Units Subcutaneous QHS  . insulin aspart  0-9 Units Subcutaneous TID WC  . insulin glargine  30 Units Subcutaneous QHS  . ipratropium-albuterol  3 mL Nebulization TID  . isosorbide mononitrate  30 mg Oral Daily  . loratadine  10 mg Oral Daily  . metoprolol tartrate  50 mg Oral BID  . pantoprazole  40 mg Oral BID  . senna-docusate  2 tablet Oral QHS  . sodium chloride flush  3 mL Intravenous Q12H   Continuous Infusions: . sodium chloride    . heparin 1,350 Units/hr (06/23/17 0711)   PRN Meds: sodium chloride, acetaminophen **OR** acetaminophen, albuterol, bisacodyl, ondansetron **OR** ondansetron (ZOFRAN) IV, sodium chloride flush  Allergies:   No Known Allergies  Social History:   Social History   Social History  . Marital status: Married    Spouse name: N/A  . Number of children: N/A  . Years of education: N/A  Occupational History  . Not on file.   Social History Main Topics  . Smoking status: Former Smoker    Types: Cigars  . Smokeless tobacco: Never Used     Comment: rarely smoked  . Alcohol use No  . Drug use: No  . Sexual activity: No   Other Topics Concern  . Not on file   Social History Narrative  . No narrative on file    Family History:   Family History  Problem Relation Age of Onset  . Asthma Neg Hx   . COPD Neg Hx      ROS:  Please see the history of present illness.  ROS  All other ROS reviewed and negative.     Physical Exam/Data:   Vitals:   06/23/17 0058 06/23/17 0421 06/23/17 0831 06/23/17 0913    BP: (!) 155/51 (!) 127/50 (!) 150/52   Pulse: 64 61 60   Resp: (!) 22 (!) 21 20   Temp: 99.5 F (37.5 C) 99.1 F (37.3 C) 98.6 F (37 C)   TempSrc: Oral Oral Oral   SpO2: 100% 100% 100% 99%  Weight: 198 lb 3.2 oz (89.9 kg)     Height: 5\' 8"  (1.727 m)       Intake/Output Summary (Last 24 hours) at 06/23/17 1125 Last data filed at 06/23/17 0302  Gross per 24 hour  Intake              120 ml  Output              725 ml  Net             -605 ml   Filed Weights   06/23/17 0058  Weight: 198 lb 3.2 oz (89.9 kg)   Body mass index is 30.14 kg/m.  General:  Well nourished, well developed, in no acute distress HEENT: normal Lymph: no adenopathy Neck: + JVD Endocrine:  No thryomegaly Vascular: No carotid bruits; FA pulses 2+ bilaterally without bruits  Cardiac:  normal S1, S2; RRR; 2/6 systolic murmur at LLSB Lungs:  clear to auscultation bilaterally, no wheezing, rhonchi or rales  Abd: soft, nontender, no hepatomegaly  Ext: 2+ to 3+ L>R B LE edema Musculoskeletal:  No deformities, BUE and BLE strength normal and equal Skin: warm and dry  Neuro:  CNs 2-12 intact, no focal abnormalities noted Psych:  Normal affect   EKG:  The EKG was personally reviewed and demonstrates:  Sinus bradycardia, Mobitz I block Telemetry:  Telemetry was personally reviewed and demonstrates:  sinus  Relevant CV Studies:  Echocardiogram 10/20/16: Study Conclusions - Left ventricle: The cavity size was normal. Wall thickness was   increased in a pattern of mild LVH. Systolic function was mildly   reduced. The estimated ejection fraction was in the range of 45%   to 50%. Akinesis of the basal-midinferior myocardium. Doppler   parameters are consistent with restrictive physiology, indicative   of decreased left ventricular diastolic compliance and/or   increased left atrial pressure. Doppler parameters are consistent   with elevated mean left atrial filling pressure. - Ventricular septum: Septal  motion showed paradox. These changes   are consistent with a post-thoracotomy state. - Mitral valve: There was mild to moderate regurgitation directed   centrally. - Left atrium: The atrium was moderately to severely dilated. - Right ventricle: The cavity size was mildly dilated.    Laboratory Data:  Chemistry Recent Labs Lab 06/19/17 06/22/17 1608 06/23/17 0108  NA 137 136 137  K 4.7 4.9 3.8  CL  --  106 108  CO2  --  21* 20*  GLUCOSE  --  211* 150*  BUN 53* 49* 45*  CREATININE 1.9* 2.21* 2.14*  CALCIUM  --  8.9 8.8*  GFRNONAA  --  24* 25*  GFRAA  --  28* 29*  ANIONGAP  --  9 9     Recent Labs Lab 15-Jul-2017 1608 06/23/17 0108  PROT 7.0 6.4*  ALBUMIN 3.2* 3.0*  AST 27 23  ALT 26 23  ALKPHOS 70 65  BILITOT 1.4* 1.5*   Hematology Recent Labs Lab Jul 15, 2017 1608 06/23/17 0108 06/23/17 0614  WBC 14.0* 9.2 7.6  RBC 3.13* 3.29*  3.29* 3.06*  HGB 9.0* 9.4* 8.8*  HCT 27.6* 28.6* 26.8*  MCV 88.2 86.9 87.6  MCH 28.8 28.6 28.8  MCHC 32.6 32.9 32.8  RDW 15.1 15.0 15.0  PLT 199 180 168   Cardiac Enzymes Recent Labs Lab July 15, 2017 1936 06/23/17 0108 06/23/17 0614  TROPONINI 0.20* 0.25* 0.22*    Recent Labs Lab 2017/07/15 1618  TROPIPOC 0.21*    BNP Recent Labs Lab 06/23/17 0108  BNP 1,149.9*    DDimer  Recent Labs Lab 2017/07/15 1936  DDIMER 2.06*    Radiology/Studies:  US Renal  Result Date: 07-15-2017 CLINICAL DATA:  Acute renal insufficiency EXAM: RENAL / URINARY TRACT ULTRASOUND COMPLETE COMPARISON:  CT abdomen and pelvis June 20, 2008 ; abdominal ultrasound December 31, 2009 FINDINGS: Right Kidney: Length: 11.6 cm. Echogenicity is mildly increased. There is renal cortical thinning. No perinephric fluid or hydronephrosis visualized. There is a cyst in the upper pole right kidney measuring 1.4 x 1.4 x 1.8 cm. No sonographically demonstrable calculus or ureterectasis. Left Kidney: Length: 10.6 cm. Echogenicity within normal limits. There is renal  cortical thinning. No perinephric fluid or hydronephrosis visualized. There is a cyst in the mid left kidney measuring 1.8 x 1.8 x 2.4 cm. No sonographically demonstrable calculus or ureterectasis. Bladder: Appears normal for degree of bladder distention. IMPRESSION: Renal cortical thinning bilaterally, a finding that may be a function of age but also may be seen with medical renal disease. Mild increased echogenicity of the right kidney, a finding that may be seen with medical renal disease. No obstructing focus in either kidney. Small cyst in each kidney noted. Electronically Signed   By: Lowella Grip III M.D.   On: 07/15/17 20:41   Dg Chest Port 1 View  Result Date: July 15, 2017 CLINICAL DATA:  Shortness of Breath EXAM: PORTABLE CHEST 1 VIEW COMPARISON:  June 11, 2017 FINDINGS: There is scarring in the left lower lobe region. There is no edema or consolidation. There is stable cardiomegaly. Patient is status post coronary artery bypass grafting. There is aortic atherosclerosis. No adenopathy. No evident bone lesions. IMPRESSION: Left lower lobe scarring. No edema or consolidation. Stable cardiomegaly and postoperative change. There is aortic atherosclerosis. Aortic Atherosclerosis (ICD10-I70.0). Electronically Signed   By: Lowella Grip III M.D.   On: 07-15-17 15:54    Assessment and Plan:   1. Acute on chronic diastolic heart failure with mildly reduced LV function  - diuresing on 40 mg IV lasix BID - weight is 198 lbs, weight was 191 lbs at clinic visit on 03/01/17 and he was noted to be near euvolemic - he is overall net negative 600cc with 725 cc urine output yesterday, continue to monitor weights and I&Os - pt states he is breathing a little better today, continues with significant LE edema - continue  BB, imdur, and hydralazine; continue to avoid ACEI./ARB with renal function   2. Acute on chronic kidney injury stage III - sCr > 2, baseline earlier this year was 1.5 - continue IV  diuresis for now, appreciate nephrology input - will aim for a weight near 190 lbs   3. Persistent Afib - not a good anticoagulation candidate - currently in sinus - continue lopressor 50 mg BID   4. CAD s/p CABG - continue ASA and statin - chest discomfort likely stemming from heart failure exacerbation - will continue to monitor, but not a cath candidate given his renal function and DNR status   5. HTN - continue meds as above   Signed, Ledora Bottcher, PA  06/23/2017 11:25 AM  I have seen and examined the patient along with Ledora Bottcher, PA.  I have reviewed the chart, notes and new data.  I agree with PA's note.  Key new complaints: breathing is much improved; he reports his optimal fluid weight is 180 lb on his home scale (wlthough consistently higher on office visits) Key examination changes: still has JVD (7-8 cm) and 2-3+ leg edema )a little more L than R Key new findings / data: creatinine up and down, close to his baseline, V/Q performed, report pending.  PLAN: Continue diuresis. Would pay less heed to elevations in creatinine. Quality of life is the primary concern for a nonagenarian.  Sanda Klein, MD, [place] [phone] 06/23/2017, 2:30 PM

## 2017-06-23 NOTE — Progress Notes (Signed)
ANTICOAGULATION CONSULT NOTE - Initial Consult  Pharmacy Consult for heparin Indication: atrial fibrillation  No Known Allergies  Patient Measurements: Height: 5\' 8"  (172.7 cm) Weight: 198 lb 3.1 oz (89.9 kg) IBW/kg (Calculated) : 68.4 Heparin Dosing Weight: 87.7kg  Assessment: 89 yom presented to the ED with worsening SOB and weight gain. He is not on anticoagulation PTA as he was not thought to be a candidate. Heparin level remains low but up to 0.2 after rate increase. No issues documented and Hgb low but stable at 9.4, plts wnl.  Goal of Therapy:  Heparin level 0.3-0.7 units/ml Monitor platelets by anticoagulation protocol: Yes   Plan:  Increase heparin gtt to 1,600 units/hr Monitor daily heparin level, CBC, s/s of bleed F/u long-term [facility] plans  Elenor Quinones, PharmD, BCPS Clinical Pharmacist Pager [phone] 06/23/2017 6:18 PM

## 2017-06-23 NOTE — Progress Notes (Signed)
[facility]  PROGRESS NOTE  Cole [name] YSA:[id number] DOB: [birth year] DOA: 06/22/2017 PCP: [name], MD   Brief HPI:    83 y.o. male with medical history significant of  CKD3, A.fib, CAD, diastolic CHF, lower extremity balance osteomyelitis, DM 2, HTN. Patient admitted with acute on chronic CHF exacerbation.    Subjective   Patient seen and examined, denies chest pain, shortness of breath. Started on IV Lasix.   Assessment/Plan:     1. Acute on chronic respiratory failure- secondary to acute diastolic CHF. Patient is currently on IV diuresis with Lasix 60 mg every 12 hours. Cardiology following. Follow BMP in a.m. 2. Persistent atrial fibrillation- CHA2DS2VASc score of 6. Patient started on heparin per pharmacy as per cardiology recommendation. Rate is controlled with metoprolol. 3. Diabetes mellitus- continue sliding scale insulin with NovoLog, Lantus 30 units subcutaneous daily.  4. CAD/ Elevated troponin- patient has elevated troponin, 0.22, 0.16, likely from demand ischemia. Patient is not a candidate for cardiac catheterization due to renal functions and DNR  status as per cardiology. Continue aspirin, statin, isosorbide.     DVT prophylaxis: Heparin   Code Status: DO NOT RESUSCITATE  Family Communication: No family present at bedside   Disposition Plan: Pending improvement in CHF   Consultants:  Cardiology  Procedures:  None  Continuous infusions . sodium chloride    . sodium chloride    . heparin 1,350 Units/hr (06/23/17 0711)      Antibiotics:   Anti-infectives    None       Objective   Vitals:   06/23/17 0913 06/23/17 1143 06/23/17 1311 06/23/17 1426  BP:  (!) 161/63    Pulse:  (!) 59    Resp:      Temp:  98.2 F (36.8 C)    TempSrc:  Oral    SpO2: 99% 96%  96%  Weight:   89.9 kg (198 lb 3.1 oz)   Height:        Intake/Output Summary (Last 24 hours) at 06/23/17 1814 Last data filed at 06/23/17 1811  Gross per 24  hour  Intake              360 ml  Output             1125 ml  Net             -765 ml   Filed Weights   06/23/17 0058 06/23/17 1311  Weight: 89.9 kg (198 lb 3.2 oz) 89.9 kg (198 lb 3.1 oz)     Physical Examination:   Physical Exam: Eyes: No icterus, extraocular muscles intact  Mouth: Oral mucosa is moist, no lesions on palate,  Neck: Supple, no deformities, masses, or tenderness Lungs: Normal respiratory effort, bilateral clear to auscultation, no crackles or wheezes.  Heart: Regular rate and rhythm, S1 and S2 normal, no murmurs, rubs auscultated Abdomen: BS normoactive,soft,nondistended,non-tender to palpation,no organomegaly Extremities: Bilateral 2+ pitting edema Neuro : Alert and oriented to time, place and person, No focal deficits  Skin: No rashes seen on exam     Data Reviewed: I have personally reviewed following labs and imaging studies  CBG:  Recent Labs Lab 06/23/17 0131 06/23/17 0808 06/23/17 1150  GLUCAP 138* 116* 116*    CBC:  Recent Labs Lab 06/22/17 1608 06/23/17 0108 06/23/17 0614 06/23/17 1441  WBC 14.0* 9.2 7.6 9.5  NEUTROABS  --   --   --  7.3  HGB 9.0* 9.4* 8.8* 9.4*  HCT 27.6*  28.6* 26.8* 28.1*  MCV 88.2 86.9 87.6 87.5  PLT 199 180 168 169    Basic Metabolic Panel:  Recent Labs Lab 06/19/17 06/22/17 1608 06/23/17 0108 06/23/17 0614 06/23/17 1441  NA 137 136 137  --  138  K 4.7 4.9 3.8  --  4.0  CL  --  106 108  --  106  CO2  --  21* 20*  --  21*  GLUCOSE  --  211* 150*  --  195*  BUN 53* 49* 45*  --  41*  CREATININE 1.9* 2.21* 2.14*  --  2.11*  CALCIUM  --  8.9 8.8*  --  8.6*  MG  --   --   --  1.9 1.8  PHOS  --   --   --  4.0  --     No results found for this or any previous visit (from the past 240 hour(s)).   Liver Function Tests:  Recent Labs Lab 06/22/17 1608 06/23/17 0108 06/23/17 1441  AST 27 23 22   ALT 26 23 25   ALKPHOS 70 65 72  BILITOT 1.4* 1.5* 1.3*  PROT 7.0 6.4* 6.6  ALBUMIN 3.2* 3.0* 2.9*     Recent Labs Lab 06/22/17 1608  LIPASE 21   No results for input(s): AMMONIA in the last 168 hours.  Cardiac Enzymes:  Recent Labs Lab 06/22/17 1936 06/23/17 0108 06/23/17 0614 06/23/17 1441  TROPONINI 0.20* 0.25* 0.22* 0.16*   BNP (last 3 results)  Recent Labs  06/11/17 1907 06/23/17 0108 06/23/17 1441  BNP 452.4* 1,149.9* 1,038.2*    ProBNP (last 3 results) No results for input(s): PROBNP in the last 8760 hours.    Studies: Dg Chest 2 View  Result Date: 06/23/2017 CLINICAL DATA:  Sob  Weak  Post  Lung  scan EXAM: CHEST  2 VIEW COMPARISON:  06/22/2017 FINDINGS: Status post median sternotomy and CABG. The heart is mildly enlarged. There are no focal consolidations. Small bilateral pleural effusions are present. There is perihilar peribronchial thickening. IMPRESSION: 1. Stable cardiomegaly. 2. Bronchitic changes -stable. 3. Small bilateral effusions. Electronically Signed   By: Nolon Nations M.D.   On: 06/23/2017 13:49   US Renal  Result Date: 06/22/2017 CLINICAL DATA:  Acute renal insufficiency EXAM: RENAL / URINARY TRACT ULTRASOUND COMPLETE COMPARISON:  CT abdomen and pelvis June 20, 2008 ; abdominal ultrasound December 31, 2009 FINDINGS: Right Kidney: Length: 11.6 cm. Echogenicity is mildly increased. There is renal cortical thinning. No perinephric fluid or hydronephrosis visualized. There is a cyst in the upper pole right kidney measuring 1.4 x 1.4 x 1.8 cm. No sonographically demonstrable calculus or ureterectasis. Left Kidney: Length: 10.6 cm. Echogenicity within normal limits. There is renal cortical thinning. No perinephric fluid or hydronephrosis visualized. There is a cyst in the mid left kidney measuring 1.8 x 1.8 x 2.4 cm. No sonographically demonstrable calculus or ureterectasis. Bladder: Appears normal for degree of bladder distention. IMPRESSION: Renal cortical thinning bilaterally, a finding that may be a function of age but also may be seen with medical  renal disease. Mild increased echogenicity of the right kidney, a finding that may be seen with medical renal disease. No obstructing focus in either kidney. Small cyst in each kidney noted. Electronically Signed   By: Lowella Grip III M.D.   On: 06/22/2017 20:41   Nm Pulmonary Perf And Vent  Result Date: 06/23/2017 CLINICAL DATA:  Shortness of breath and elevated D-dimer. EXAM: NUCLEAR MEDICINE VENTILATION - PERFUSION LUNG SCAN TECHNIQUE:  Ventilation images were obtained in multiple projections using inhaled aerosol Tc-90m DTPA. Perfusion images were obtained in multiple projections after intravenous injection of Tc-94m MAA. RADIOPHARMACEUTICALS:  30 mCi Technetium-79m DTPA aerosol inhalation and 4.0 mCi Technetium-56m MAA IV COMPARISON:  Chest x-ray yesterday and earlier today. FINDINGS: Ventilation: No significant bili tori defects are identified. Perfusion: Perfusion imaging is unremarkable without evidence of focal perfusion defect. IMPRESSION: Normal ventilation and perfusion scintigraphy demonstrating no evidence of pulmonary embolism. Electronically Signed   By: Aletta Edouard M.D.   On: 06/23/2017 17:51   Dg Chest Port 1 View  Result Date: 06/22/2017 CLINICAL DATA:  Shortness of Breath EXAM: PORTABLE CHEST 1 VIEW COMPARISON:  June 11, 2017 FINDINGS: There is scarring in the left lower lobe region. There is no edema or consolidation. There is stable cardiomegaly. Patient is status post coronary artery bypass grafting. There is aortic atherosclerosis. No adenopathy. No evident bone lesions. IMPRESSION: Left lower lobe scarring. No edema or consolidation. Stable cardiomegaly and postoperative change. There is aortic atherosclerosis. Aortic Atherosclerosis (ICD10-I70.0). Electronically Signed   By: Lowella Grip III M.D.   On: 06/22/2017 15:54    Scheduled Meds: . aspirin EC  81 mg Oral Daily  . cholecalciferol  1,000 Units Oral Daily  . docusate sodium  100 mg Oral Daily  . furosemide   60 mg Intravenous Q12H  . hydrALAZINE  10 mg Oral BID  . insulin aspart  0-5 Units Subcutaneous QHS  . insulin aspart  0-9 Units Subcutaneous TID WC  . insulin glargine  30 Units Subcutaneous QHS  . ipratropium-albuterol  3 mL Nebulization TID  . isosorbide mononitrate  30 mg Oral Daily  . latanoprost  1 drop Both Eyes QHS  . loratadine  10 mg Oral Daily  . metoprolol tartrate  50 mg Oral BID  . multivitamin with minerals  1 tablet Oral Daily  . pantoprazole  40 mg Oral BID  . potassium chloride  20 mEq Oral BID  . rosuvastatin  5 mg Oral Once per day on Mon Wed Fri  . senna-docusate  2 tablet Oral QHS  . sodium chloride flush  3 mL Intravenous Q12H  . sodium chloride flush  3 mL Intravenous Q12H  . tamsulosin  0.4 mg Oral QHS  . vitamin B-12  1,000 mcg Oral Daily      Time spent: 25 min  [facility] Pager [phone]. If 7PM-7AM, please contact night-coverage at [email or web address], Office  [phone]  password [place]  06/23/2017, 6:14 PM  LOS: 1 day

## 2017-06-23 NOTE — Care Management Note (Signed)
Case Management Note  Patient Details  Name: Cole [name] MRN: [id number] Date of Birth: 06-30-1920  Subjective/Objective:                 Patient admitted from [facility]. Diuresing, Palliative following.   Action/Plan:  CM and CSW following for dispo needs.   Expected Discharge Date:                  Expected Discharge Plan:  Skilled Nursing Facility  In-House Referral:  Clinical Social Work  Discharge planning Services  CM Consult  Post Acute Care Choice:    Choice offered to:     DME Arranged:    DME Agency:     HH Arranged:    [place] Agency:     Status of Service:  In process, will continue to follow  If discussed at Long Length of Stay Meetings, dates discussed:    Additional Comments:  Cole Collet, RN 06/23/2017, 9:59 AM

## 2017-06-24 ENCOUNTER — Inpatient Hospital Stay (HOSPITAL_COMMUNITY): Payer: Medicare HMO

## 2017-06-24 DIAGNOSIS — N189 Chronic kidney disease, unspecified: Secondary | ICD-10-CM

## 2017-06-24 DIAGNOSIS — E0821 Diabetes mellitus due to underlying condition with diabetic nephropathy: Secondary | ICD-10-CM

## 2017-06-24 DIAGNOSIS — N179 Acute kidney failure, unspecified: Secondary | ICD-10-CM

## 2017-06-24 DIAGNOSIS — I340 Nonrheumatic mitral (valve) insufficiency: Secondary | ICD-10-CM

## 2017-06-24 DIAGNOSIS — I351 Nonrheumatic aortic (valve) insufficiency: Secondary | ICD-10-CM

## 2017-06-24 DIAGNOSIS — I481 Persistent atrial fibrillation: Secondary | ICD-10-CM

## 2017-06-24 DIAGNOSIS — R609 Edema, unspecified: Secondary | ICD-10-CM

## 2017-06-24 LAB — ECHOCARDIOGRAM COMPLETE
CHL CUP MV DEC (S): 261
CHL CUP RV SYS PRESS: 48 mmHg
CHL CUP TV REG PEAK VELOCITY: 286 cm/s
EWDT: 261 ms
FS: 33 % (ref 28–44)
Height: 68 in
IVS/LV PW RATIO, ED: 1.17
LA ID, A-P, ES: 45 mm
LA vol A4C: 75.7 ml
LA vol: 78.9 mL
LADIAMINDEX: 2.16 cm/m2
LAVOLIN: 37.9 mL/m2
LEFT ATRIUM END SYS DIAM: 45 mm
LV PW d: 12 mm — AB (ref 0.6–1.1)
LVOT area: 2.54 cm2
LVOT diameter: 18 mm
MV pk E vel: 1.4 m/s
P 1/2 time: 407 ms
RV TAPSE: 19.7 mm
TRMAXVEL: 286 cm/s
VTI: 214 cm
Weight: 3113.6 oz

## 2017-06-24 LAB — CBC
HEMATOCRIT: 25.3 % — AB (ref 39.0–52.0)
HEMOGLOBIN: 8.4 g/dL — AB (ref 13.0–17.0)
MCH: 28.8 pg (ref 26.0–34.0)
MCHC: 33.2 g/dL (ref 30.0–36.0)
MCV: 86.6 fL (ref 78.0–100.0)
PLATELETS: 187 10*3/uL (ref 150–400)
RBC: 2.92 MIL/uL — ABNORMAL LOW (ref 4.22–5.81)
RDW: 14.7 % (ref 11.5–15.5)
WBC: 6.5 10*3/uL (ref 4.0–10.5)

## 2017-06-24 LAB — BASIC METABOLIC PANEL
ANION GAP: 7 (ref 5–15)
BUN: 38 mg/dL — AB (ref 6–20)
CALCIUM: 8.3 mg/dL — AB (ref 8.9–10.3)
CO2: 22 mmol/L (ref 22–32)
Chloride: 107 mmol/L (ref 101–111)
Creatinine, Ser: 2.06 mg/dL — ABNORMAL HIGH (ref 0.61–1.24)
GFR calc Af Amer: 30 mL/min — ABNORMAL LOW (ref >60)
GFR, EST NON AFRICAN AMERICAN: 26 mL/min — AB (ref >60)
GLUCOSE: 152 mg/dL — AB (ref 65–99)
Potassium: 3.7 mmol/L (ref 3.5–5.1)
Sodium: 136 mmol/L (ref 135–145)

## 2017-06-24 LAB — TROPONIN I: Troponin I: 0.14 ng/mL (ref <0.03)

## 2017-06-24 LAB — GLUCOSE, CAPILLARY
GLUCOSE-CAPILLARY: 163 mg/dL — AB (ref 65–99)
Glucose-Capillary: 149 mg/dL — ABNORMAL HIGH (ref 65–99)
Glucose-Capillary: 187 mg/dL — ABNORMAL HIGH (ref 65–99)
Glucose-Capillary: 136 mg/dL — ABNORMAL HIGH (ref 65–99)
Glucose-Capillary: 184 mg/dL — ABNORMAL HIGH (ref 65–99)

## 2017-06-24 LAB — HEPARIN LEVEL (UNFRACTIONATED)
HEPARIN UNFRACTIONATED: 0.46 [IU]/mL (ref 0.30–0.70)
Heparin Unfractionated: 0.77 IU/mL — ABNORMAL HIGH (ref 0.30–0.70)

## 2017-06-24 MED ORDER — HEPARIN SODIUM (PORCINE) 5000 UNIT/ML IJ SOLN
5000.0000 [IU] | Freq: Three times a day (TID) | INTRAMUSCULAR | Status: DC
  Administered 2017-06-24 – 2017-06-26 (×4): 5000 [IU] via SUBCUTANEOUS
  Filled 2017-06-24 (×5): qty 1

## 2017-06-24 MED ORDER — FUROSEMIDE 80 MG PO TABS
80.0000 mg | ORAL_TABLET | Freq: Every day | ORAL | Status: DC
  Administered 2017-06-24 – 2017-06-26 (×3): 80 mg via ORAL
  Filled 2017-06-24 (×3): qty 1

## 2017-06-24 MED ORDER — IPRATROPIUM-ALBUTEROL 0.5-2.5 (3) MG/3ML IN SOLN
3.0000 mL | Freq: Two times a day (BID) | RESPIRATORY_TRACT | Status: DC
  Administered 2017-06-24 – 2017-06-26 (×5): 3 mL via RESPIRATORY_TRACT
  Filled 2017-06-24 (×5): qty 3

## 2017-06-24 MED ORDER — FERUMOXYTOL INJECTION 510 MG/17 ML: 510.0000 mg | Freq: Once | INTRAVENOUS | Status: DC

## 2017-06-24 NOTE — Progress Notes (Signed)
ANTICOAGULATION CONSULT NOTE - Follow Up Consult  Pharmacy Consult for Heparin Indication: atrial fibrillation  No Known Allergies  Patient Measurements: Height: 5\' 8"  (172.7 cm) Weight: 194 lb 9.6 oz (88.3 kg) IBW/kg (Calculated) : 68.4 Heparin Dosing Weight: 88 kg  Vital Signs: Temp: 98.4 F (36.9 C) (08/09 0753) Temp Source: Oral (08/09 0753) BP: 146/44 (08/09 0954) Pulse Rate: 76 (08/09 0954)  Labs:  Recent Labs  06/22/17 1608  06/23/17 0108  06/23/17 7078 06/23/17 1441 06/23/17 1927 06/24/17 0103 06/24/17 0255 06/24/17 1035  HGB 9.0*  --  9.4*  --  8.8* 9.4*  --   --  8.4*  --   HCT 27.6*  --  28.6*  --  26.8* 28.1*  --   --  25.3*  --   PLT 199  --  180  --  168 177  --   --  187  --   LABPROT 14.3  --   --   --   --   --   --   --   --   --   INR 1.11  --   --   --   --   --   --   --   --   --   HEPARINUNFRC  --   --   --   < > 0.13* 0.20*  --   --  0.46 0.77*  CREATININE 2.21*  --  2.14*  --   --  2.11*  --   --  2.06*  --   TROPONINI  --   < > 0.25*  --  0.22* 0.16* 0.13* 0.14*  --   --   < > = values in this interval not displayed.  Estimated Creatinine Clearance: 23.7 mL/min (A) (by C-G formula based on SCr of 2.06 mg/dL (H)).  Assessment:  89 yom presented to the ED on 8/7 with worsening SOB and weight gain. Pharmacy consulted to dose IV heparin for history of afib. He is not on anticoagulation PTA as he was not thought to be a candidate.    Heparin level has trended up from 0.46 to 0.77 on 1600 units/hr. Now just above target range.  CBC trended down a bit, no bleeding reported.  Feraheme x 1 given on 8/8 pm.  Goal of Therapy:  Heparin level 0.3-0.7 units/ml Monitor platelets by anticoagulation protocol: Yes   Plan:   Decrease heparin drip to 1500 units/hr.  Heparin level ~8 hrs after rate change.  Daily heparin level and CBC while on heparin.  Follow up anticoagulation plans.  Arty Baumgartner, [place] Pager: [phone] 06/24/2017,11:44  AM

## 2017-06-24 NOTE — Progress Notes (Signed)
Progress Note  Patient Name: Cole [name] Date of Encounter: 06/24/2017  Primary Cardiologist: Tamala Julian  Subjective   Feels well, denies dyspnea. No evidence of thromboemboli on VQ scan or lower extremity duplex study. Net diuresis 2.5 L since admission. Weight down 4 pounds since yesterday. Despite diuresis, creatinine essentially unchanged since admission.  Inpatient Medications    Scheduled Meds: . aspirin EC  81 mg Oral Daily  . cholecalciferol  1,000 Units Oral Daily  . docusate sodium  100 mg Oral Daily  . furosemide  60 mg Intravenous Q12H  . hydrALAZINE  10 mg Oral BID  . insulin aspart  0-5 Units Subcutaneous QHS  . insulin aspart  0-9 Units Subcutaneous TID WC  . insulin glargine  30 Units Subcutaneous QHS  . ipratropium-albuterol  3 mL Nebulization BID  . isosorbide mononitrate  30 mg Oral Daily  . latanoprost  1 drop Both Eyes QHS  . loratadine  10 mg Oral Daily  . metoprolol tartrate  50 mg Oral BID  . multivitamin with minerals  1 tablet Oral Daily  . pantoprazole  40 mg Oral BID  . potassium chloride  20 mEq Oral BID  . rosuvastatin  5 mg Oral Once per day on Mon Wed Fri  . senna-docusate  2 tablet Oral QHS  . sodium chloride flush  3 mL Intravenous Q12H  . sodium chloride flush  3 mL Intravenous Q12H  . tamsulosin  0.4 mg Oral QHS  . vitamin B-12  1,000 mcg Oral Daily   Continuous Infusions: . sodium chloride    . sodium chloride    . heparin 1,500 Units/hr (06/24/17 1210)   PRN Meds: sodium chloride, sodium chloride, acetaminophen **OR** acetaminophen, albuterol, bisacodyl, ondansetron **OR** ondansetron (ZOFRAN) IV, sodium chloride flush, sodium chloride flush, technetium TC 72M diethylenetriame-pentaacetic acid   Vital Signs    Vitals:   06/23/17 2009 06/24/17 0349 06/24/17 0753 06/24/17 0954  BP: (!) 137/50  (!) 155/64 (!) 146/44  Pulse: 63  64 76  Resp:      Temp: 98.6 F (37 C)  98.4 F (36.9 C)   TempSrc: Oral  Oral   SpO2: 100%  99%  99%  Weight:  194 lb 9.6 oz (88.3 kg)    Height:        Intake/Output Summary (Last 24 hours) at 06/24/17 1213 Last data filed at 06/24/17 1027  Gross per 24 hour  Intake           809.73 ml  Output             2780 ml  Net         -1970.27 ml   Filed Weights   06/23/17 0058 06/23/17 1311 06/24/17 0349  Weight: 198 lb 3.2 oz (89.9 kg) 198 lb 3.1 oz (89.9 kg) 194 lb 9.6 oz (88.3 kg)    Telemetry    Normal sinus rhythm - Personally Reviewed  ECG    No new tracing - Personally Reviewed  Physical Exam  calm, relaxed and comfortable. He is able to lie completely flat in bed without difficulty GEN: No acute distress.   Neck:  JVP 6-7 centimeters above sternal angle Cardiac: RRR, no diastolic murmurs, rubs, or gallops. 2/6 holosystolic murmur at the left lower sternal border Respiratory: Clear to auscultation bilaterally. GI: Soft, nontender, non-distended  MS:  no longer has edema in the right lower extremity, but 1-2 plus ankle edema on the left; No deformity. Neuro:  Nonfocal  Psych:  Normal affect   Labs    Chemistry Recent Labs Lab 06/22/17 1608 06/23/17 0108 06/23/17 1441 06/24/17 0255  NA 136 137 138 136  K 4.9 3.8 4.0 3.7  CL 106 108 106 107  CO2 21* 20* 21* 22  GLUCOSE 211* 150* 195* 152*  BUN 49* 45* 41* 38*  CREATININE 2.21* 2.14* 2.11* 2.06*  CALCIUM 8.9 8.8* 8.6* 8.3*  PROT 7.0 6.4* 6.6  --   ALBUMIN 3.2* 3.0* 2.9*  --   AST 27 23 22   --   ALT 26 23 25   --   ALKPHOS 70 65 72  --   BILITOT 1.4* 1.5* 1.3*  --   GFRNONAA 24* 25* 25* 26*  GFRAA 28* 29* 29* 30*  ANIONGAP 9 9 11 7      Hematology Recent Labs Lab 06/23/17 0614 06/23/17 1441 06/24/17 0255  WBC 7.6 9.5 6.5  RBC 3.06* 3.21* 2.92*  HGB 8.8* 9.4* 8.4*  HCT 26.8* 28.1* 25.3*  MCV 87.6 87.5 86.6  MCH 28.8 29.3 28.8  MCHC 32.8 33.5 33.2  RDW 15.0 14.9 14.7  PLT 168 177 187    Cardiac Enzymes Recent Labs Lab 06/23/17 0614 06/23/17 1441 06/23/17 1927 06/24/17 0103  TROPONINI  0.22* 0.16* 0.13* 0.14*    Recent Labs Lab 06/22/17 1618  TROPIPOC 0.21*     BNP Recent Labs Lab 06/23/17 0108 06/23/17 1441  BNP 1,149.9* 1,038.2*     DDimer  Recent Labs Lab 06/22/17 1936  DDIMER 2.06*     Radiology    Dg Chest 2 View  Result Date: 06/23/2017 CLINICAL DATA:  Sob  Weak  Post  Lung  scan EXAM: CHEST  2 VIEW COMPARISON:  06/22/2017 FINDINGS: Status post median sternotomy and CABG. The heart is mildly enlarged. There are no focal consolidations. Small bilateral pleural effusions are present. There is perihilar peribronchial thickening. IMPRESSION: 1. Stable cardiomegaly. 2. Bronchitic changes -stable. 3. Small bilateral effusions. Electronically Signed   By: Nolon Nations M.D.   On: 06/23/2017 13:49   US Renal  Result Date: 06/22/2017 CLINICAL DATA:  Acute renal insufficiency EXAM: RENAL / URINARY TRACT ULTRASOUND COMPLETE COMPARISON:  CT abdomen and pelvis June 20, 2008 ; abdominal ultrasound December 31, 2009 FINDINGS: Right Kidney: Length: 11.6 cm. Echogenicity is mildly increased. There is renal cortical thinning. No perinephric fluid or hydronephrosis visualized. There is a cyst in the upper pole right kidney measuring 1.4 x 1.4 x 1.8 cm. No sonographically demonstrable calculus or ureterectasis. Left Kidney: Length: 10.6 cm. Echogenicity within normal limits. There is renal cortical thinning. No perinephric fluid or hydronephrosis visualized. There is a cyst in the mid left kidney measuring 1.8 x 1.8 x 2.4 cm. No sonographically demonstrable calculus or ureterectasis. Bladder: Appears normal for degree of bladder distention. IMPRESSION: Renal cortical thinning bilaterally, a finding that may be a function of age but also may be seen with medical renal disease. Mild increased echogenicity of the right kidney, a finding that may be seen with medical renal disease. No obstructing focus in either kidney. Small cyst in each kidney noted. Electronically Signed   By:  Lowella Grip III M.D.   On: 06/22/2017 20:41   Nm Pulmonary Perf And Vent  Result Date: 06/23/2017 CLINICAL DATA:  Shortness of breath and elevated D-dimer. EXAM: NUCLEAR MEDICINE VENTILATION - PERFUSION LUNG SCAN TECHNIQUE: Ventilation images were obtained in multiple projections using inhaled aerosol Tc-86m DTPA. Perfusion images were obtained in multiple projections after intravenous injection of Tc-33m MAA. RADIOPHARMACEUTICALS:  30 mCi Technetium-75m DTPA aerosol inhalation and 4.0 mCi Technetium-54m MAA IV COMPARISON:  Chest x-ray yesterday and earlier today. FINDINGS: Ventilation: No significant bili tori defects are identified. Perfusion: Perfusion imaging is unremarkable without evidence of focal perfusion defect. IMPRESSION: Normal ventilation and perfusion scintigraphy demonstrating no evidence of pulmonary embolism. Electronically Signed   By: Aletta Edouard M.D.   On: 06/23/2017 17:51   Dg Chest Port 1 View  Result Date: 06/22/2017 CLINICAL DATA:  Shortness of Breath EXAM: PORTABLE CHEST 1 VIEW COMPARISON:  June 11, 2017 FINDINGS: There is scarring in the left lower lobe region. There is no edema or consolidation. There is stable cardiomegaly. Patient is status post coronary artery bypass grafting. There is aortic atherosclerosis. No adenopathy. No evident bone lesions. IMPRESSION: Left lower lobe scarring. No edema or consolidation. Stable cardiomegaly and postoperative change. There is aortic atherosclerosis. Aortic Atherosclerosis (ICD10-I70.0). Electronically Signed   By: Lowella Grip III M.D.   On: 06/22/2017 15:54    Cardiac Studies    Echocardiogram 10/20/16: Study Conclusions - Left ventricle: The cavity size was normal. Wall thickness was increased in a pattern of mild LVH. Systolic function was mildly reduced. The estimated ejection fraction was in the range of 45% to 50%. Akinesis of the basal-midinferior myocardium. Doppler parameters are consistent with  restrictive physiology, indicative of decreased left ventricular diastolic compliance and/or increased left atrial pressure. Doppler parameters are consistent with elevated mean left atrial filling pressure. - Ventricular septum: Septal motion showed paradox. These changes are consistent with a post-thoracotomy state. - Mitral valve: There was mild to moderate regurgitation directed centrally. - Left atrium: The atrium was moderately to severely dilated. - Right ventricle: The cavity size was mildly dilated.  Patient Profile     83 y.o. male with CAD, previous bypass surgery, hypertension, history of previous persistent atrial fibrillation, mildly depressed left ventricular systolic function presenting with acute exacerbation of combined systolic and diastolic heart failure after introduction of diuretics for her worsening renal function parameters  Assessment & Plan    1. CHF: I think today he is approaching the optimal balance between renal insufficiency and heart failure symptoms, but still has some signs of hypervolemia. Try to keep volume status tightly controlled with weight 190-194 lb. . Reports that his weight used to be 185 pounds or less, but that after his daily walking was curtailed by his wound care doctor, he has gained weight.  I think we can switch to oral diuretics today. Unfortunately, I can't figure out what dose of medicines he was on before the problems with renal insufficiency all started.  2. CAD: After receiving diuretics, he has not had any further chest discomfort. No plan at this point to evaluate for coronary insufficiency. 3. CKD4: Despite his very advanced age, he is remarkably well-preserved from a cognitive point of view and is very functional. Discussed with Dr. Lorrene Reid, if necessary she would offer him chronic hemodialysis. In my opinion, I wouldn't increase in serum creatinine to 3.0 without stopping diuretics, since heart failure is clearly having a  bigger impact on his quality of life that his kidney problem. 4. AFib: Has previously been deemed to be a poor candidate for anticoagulation. Even now, without anticoagulation he has anemia and requires intravenous iron. I don't think we need to continue his heparin, other than administer DVT prophylaxis doses. 5. HTN: Although his systolic blood pressure remains mildly elevated, he has very low diastolic blood pressure and I would not increase his antihypertensives any  further.  Signed, Sanda Klein, MD  06/24/2017, 12:13 PM

## 2017-06-24 NOTE — Progress Notes (Signed)
[facility]  PROGRESS NOTE  Cole [name] PIR:[id number] DOB: Dec 03, 1919 DOA: 06/22/2017 PCP: [name], MD   Brief HPI:    83 y.o. male with medical history significant of  CKD3, A.fib, CAD, diastolic CHF, lower extremity balance osteomyelitis, DM 2, HTN. Patient admitted with acute on chronic CHF exacerbation.    Subjective   Patient seen and examined,Denies chest pain or shortness of breath.   Assessment/Plan:     1. Acute on chronic respiratory failure- secondary to acute diastolic CHF. Patient was started on  IV diuresis with Lasix 60 mg every 12 hours. Lasix has now been changed to 80 mg by mouth daily. Cardiology following. 2. Persistent atrial fibrillation- CHA2DS2VASc score of 6. Patient was started on heparin per pharmacy as per cardiology recommendation currently heparin has been discontinued by cardiology as patient is not a good candidate for anticoagulation.,  Rate is controlled with metoprolol. 3. Diabetes mellitus- continue sliding scale insulin with NovoLog, Lantus 30 units subcutaneous daily.  blood glucose is stable 187, 149  4. CAD/ Elevated troponin- patient has elevated troponin, 0.22, 0.16, likely from demand ischemia. Patient is not a candidate for cardiac catheterization due to renal functions and DNR  status as per cardiology. Continue aspirin, statin, isosorbide. 5. Acute on CK D stage IV- patient's baseline creatinine is around 1.8 2.0, today creatinine is 2.06. Improved with IV diuresis. Nephrology on board. Follow BMP in a.m.     DVT prophylaxis: Heparin   Code Status: DO NOT RESUSCITATE  Family Communication: No family present at bedside   Disposition Plan: Pending improvement in CHF   Consultants:  Cardiology  Procedures:  None  Continuous infusions . sodium chloride    . sodium chloride    . [START ON 06/29/2017] ferumoxytol        Antibiotics:   Anti-infectives    None       Objective   Vitals:   06/23/17  2009 06/24/17 0349 06/24/17 0753 06/24/17 0954  BP: (!) 137/50  (!) 155/64 (!) 146/44  Pulse: 63  64 76  Resp:      Temp: 98.6 F (37 C)  98.4 F (36.9 C)   TempSrc: Oral  Oral   SpO2: 100%  99% 99%  Weight:  88.3 kg (194 lb 9.6 oz)    Height:        Intake/Output Summary (Last 24 hours) at 06/24/17 1405 Last data filed at 06/24/17 1325  Gross per 24 hour  Intake           809.73 ml  Output             3080 ml  Net         -2270.27 ml   Filed Weights   06/23/17 0058 06/23/17 1311 06/24/17 0349  Weight: 89.9 kg (198 lb 3.2 oz) 89.9 kg (198 lb 3.1 oz) 88.3 kg (194 lb 9.6 oz)     Physical Examination:   Physical Exam: Eyes: No icterus, extraocular muscles intact  Mouth: Oral mucosa is moist, no lesions on palate,  Neck: Supple, no deformities, masses, or tenderness Lungs: Normal respiratory effort, bilateral clear to auscultation, no crackles or wheezes.  Heart: Regular rate and rhythm, S1 and S2 normal, no murmurs, rubs auscultated Abdomen: BS normoactive,soft,nondistended,non-tender to palpation,no organomegaly Extremities: Bilateral 1+ pitting edema of lower extremities Neuro : Alert and oriented to time, place and person, No focal deficits  Skin: No rashes seen on exam     Data Reviewed: I have  personally reviewed following labs and imaging studies  CBG:  Recent Labs Lab 06/23/17 1724 06/23/17 2125 06/24/17 0122 06/24/17 0751 06/24/17 1221  GLUCAP 171* 228* 163* 149* 187*    CBC:  Recent Labs Lab 06/22/17 1608 06/23/17 0108 06/23/17 0614 06/23/17 1441 06/24/17 0255  WBC 14.0* 9.2 7.6 9.5 6.5  NEUTROABS  --   --   --  7.3  --   HGB 9.0* 9.4* 8.8* 9.4* 8.4*  HCT 27.6* 28.6* 26.8* 28.1* 25.3*  MCV 88.2 86.9 87.6 87.5 86.6  PLT 199 180 168 177 242    Basic Metabolic Panel:  Recent Labs Lab 06/19/17 06/22/17 1608 06/23/17 0108 06/23/17 0614 06/23/17 1441 06/24/17 0255  NA 137 136 137  --  138 136  K 4.7 4.9 3.8  --  4.0 3.7  CL  --   106 108  --  106 107  CO2  --  21* 20*  --  21* 22  GLUCOSE  --  211* 150*  --  195* 152*  BUN 53* 49* 45*  --  41* 38*  CREATININE 1.9* 2.21* 2.14*  --  2.11* 2.06*  CALCIUM  --  8.9 8.8*  --  8.6* 8.3*  MG  --   --   --  1.9 1.8  --   PHOS  --   --   --  4.0  --   --     No results found for this or any previous visit (from the past 240 hour(s)).   Liver Function Tests:  Recent Labs Lab 06/22/17 1608 06/23/17 0108 06/23/17 1441  AST 27 23 22   ALT 26 23 25   ALKPHOS 70 65 72  BILITOT 1.4* 1.5* 1.3*  PROT 7.0 6.4* 6.6  ALBUMIN 3.2* 3.0* 2.9*    Recent Labs Lab 06/22/17 1608  LIPASE 21   No results for input(s): AMMONIA in the last 168 hours.  Cardiac Enzymes:  Recent Labs Lab 06/23/17 0108 06/23/17 0614 06/23/17 1441 06/23/17 1927 06/24/17 0103  TROPONINI 0.25* 0.22* 0.16* 0.13* 0.14*   BNP (last 3 results)  Recent Labs  06/11/17 1907 06/23/17 0108 06/23/17 1441  BNP 452.4* 1,149.9* 1,038.2*    ProBNP (last 3 results) No results for input(s): PROBNP in the last 8760 hours.    Studies: Dg Chest 2 View  Result Date: 06/23/2017 CLINICAL DATA:  Sob  Weak  Post  Lung  scan EXAM: CHEST  2 VIEW COMPARISON:  06/22/2017 FINDINGS: Status post median sternotomy and CABG. The heart is mildly enlarged. There are no focal consolidations. Small bilateral pleural effusions are present. There is perihilar peribronchial thickening. IMPRESSION: 1. Stable cardiomegaly. 2. Bronchitic changes -stable. 3. Small bilateral effusions. Electronically Signed   By: Nolon Nations M.D.   On: 06/23/2017 13:49   US Renal  Result Date: 06/22/2017 CLINICAL DATA:  Acute renal insufficiency EXAM: RENAL / URINARY TRACT ULTRASOUND COMPLETE COMPARISON:  CT abdomen and pelvis June 20, 2008 ; abdominal ultrasound December 31, 2009 FINDINGS: Right Kidney: Length: 11.6 cm. Echogenicity is mildly increased. There is renal cortical thinning. No perinephric fluid or hydronephrosis visualized.  There is a cyst in the upper pole right kidney measuring 1.4 x 1.4 x 1.8 cm. No sonographically demonstrable calculus or ureterectasis. Left Kidney: Length: 10.6 cm. Echogenicity within normal limits. There is renal cortical thinning. No perinephric fluid or hydronephrosis visualized. There is a cyst in the mid left kidney measuring 1.8 x 1.8 x 2.4 cm. No sonographically demonstrable calculus or ureterectasis. Bladder: Appears  normal for degree of bladder distention. IMPRESSION: Renal cortical thinning bilaterally, a finding that may be a function of age but also may be seen with medical renal disease. Mild increased echogenicity of the right kidney, a finding that may be seen with medical renal disease. No obstructing focus in either kidney. Small cyst in each kidney noted. Electronically Signed   By: Lowella Grip III M.D.   On: 06/22/2017 20:41   Nm Pulmonary Perf And Vent  Result Date: 06/23/2017 CLINICAL DATA:  Shortness of breath and elevated D-dimer. EXAM: NUCLEAR MEDICINE VENTILATION - PERFUSION LUNG SCAN TECHNIQUE: Ventilation images were obtained in multiple projections using inhaled aerosol Tc-39m DTPA. Perfusion images were obtained in multiple projections after intravenous injection of Tc-45m MAA. RADIOPHARMACEUTICALS:  30 mCi Technetium-41m DTPA aerosol inhalation and 4.0 mCi Technetium-65m MAA IV COMPARISON:  Chest x-ray yesterday and earlier today. FINDINGS: Ventilation: No significant bili tori defects are identified. Perfusion: Perfusion imaging is unremarkable without evidence of focal perfusion defect. IMPRESSION: Normal ventilation and perfusion scintigraphy demonstrating no evidence of pulmonary embolism. Electronically Signed   By: Aletta Edouard M.D.   On: 06/23/2017 17:51   Dg Chest Port 1 View  Result Date: 06/22/2017 CLINICAL DATA:  Shortness of Breath EXAM: PORTABLE CHEST 1 VIEW COMPARISON:  June 11, 2017 FINDINGS: There is scarring in the left lower lobe region. There is no  edema or consolidation. There is stable cardiomegaly. Patient is status post coronary artery bypass grafting. There is aortic atherosclerosis. No adenopathy. No evident bone lesions. IMPRESSION: Left lower lobe scarring. No edema or consolidation. Stable cardiomegaly and postoperative change. There is aortic atherosclerosis. Aortic Atherosclerosis (ICD10-I70.0). Electronically Signed   By: Lowella Grip III M.D.   On: 06/22/2017 15:54    Scheduled Meds: . aspirin EC  81 mg Oral Daily  . cholecalciferol  1,000 Units Oral Daily  . docusate sodium  100 mg Oral Daily  . furosemide  80 mg Oral Daily  . heparin subcutaneous  5,000 Units Subcutaneous Q8H  . hydrALAZINE  10 mg Oral BID  . insulin aspart  0-5 Units Subcutaneous QHS  . insulin aspart  0-9 Units Subcutaneous TID WC  . insulin glargine  30 Units Subcutaneous QHS  . ipratropium-albuterol  3 mL Nebulization BID  . isosorbide mononitrate  30 mg Oral Daily  . latanoprost  1 drop Both Eyes QHS  . loratadine  10 mg Oral Daily  . metoprolol tartrate  50 mg Oral BID  . multivitamin with minerals  1 tablet Oral Daily  . pantoprazole  40 mg Oral BID  . potassium chloride  20 mEq Oral BID  . rosuvastatin  5 mg Oral Once per day on Mon Wed Fri  . senna-docusate  2 tablet Oral QHS  . sodium chloride flush  3 mL Intravenous Q12H  . sodium chloride flush  3 mL Intravenous Q12H  . tamsulosin  0.4 mg Oral QHS  . vitamin B-12  1,000 mcg Oral Daily      Time spent: 25 min  [facility] Pager [phone]. If 7PM-7AM, please contact night-coverage at [email or web address], Office  [phone]  password [place]  06/24/2017, 2:05 PM  LOS: 2 days

## 2017-06-24 NOTE — Progress Notes (Signed)
  Echocardiogram 2D Echocardiogram has been performed.  Cole [name] 06/24/2017, 2:07 PM

## 2017-06-24 NOTE — Progress Notes (Signed)
ANTICOAGULATION CONSULT NOTE - F/u Consult  Pharmacy Consult for heparin Indication: atrial fibrillation  No Known Allergies  Patient Measurements: Height: 5\' 8"  (172.7 cm) Weight: 198 lb 3.1 oz (89.9 kg) IBW/kg (Calculated) : 68.4 Heparin Dosing Weight: 87.7kg  Assessment: 89 yom presented to the ED with worsening SOB and weight gain. Pharmacy consulted to dose heparin for history of afib. He is not on anticoagulation PTA as he was not thought to be a candidate. Heparin level is therapeutic and CBC low-stable. No overt s/s bleeding noted.   Goal of Therapy:  Heparin level 0.3-0.7 units/ml Monitor platelets by anticoagulation protocol: Yes   Plan:  Continue heparin gtt at 1,600 units/hr Confirmatory heparin level in 8 hours  Monitor daily heparin level, CBC, s/s of bleed F/u long-term AC plans  Argie Ramming, PharmD Clinical Pharmacist 06/24/17 3:34 AM

## 2017-06-24 NOTE — Progress Notes (Signed)
Patient without complaint during 7 a to 7 p shift, states overall he feels much improved, shortness of breath much better.  Patient walked to bathroom and in room with standby assist.  Daughter at bedside.
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
## 2017-06-24 NOTE — Plan of Care (Signed)
Problem: Pain Managment: Goal: General experience of comfort will improve Outcome: Progressing Patient reports feeling much improved, shortness of breath much improved   Problem: Tissue Perfusion: Goal: Risk factors for ineffective tissue perfusion will decrease Outcome: Progressing Oxygen saturation in the 90's on room air   Problem: Activity: Goal: Risk for activity intolerance will decrease Outcome: Progressing Shortness of breath with exertion has improved

## 2017-06-24 NOTE — Progress Notes (Signed)
VASCULAR LAB PRELIMINARY  PRELIMINARY  PRELIMINARY  PRELIMINARY  Left lower extremity venous duplex completed.    Preliminary report:  Left:  No evidence of DVT, superficial thrombosis, or Baker's cyst.  Sabre Leonetti, RVS 06/24/2017, 11:13 AM

## 2017-06-25 DIAGNOSIS — I482 Chronic atrial fibrillation: Secondary | ICD-10-CM

## 2017-06-25 LAB — GLUCOSE, CAPILLARY
GLUCOSE-CAPILLARY: 170 mg/dL — AB (ref 65–99)
GLUCOSE-CAPILLARY: 182 mg/dL — AB (ref 65–99)
Glucose-Capillary: 103 mg/dL — ABNORMAL HIGH (ref 65–99)
Glucose-Capillary: 206 mg/dL — ABNORMAL HIGH (ref 65–99)

## 2017-06-25 LAB — RENAL FUNCTION PANEL
ANION GAP: 9 (ref 5–15)
Albumin: 3 g/dL — ABNORMAL LOW (ref 3.5–5.0)
BUN: 30 mg/dL — AB (ref 6–20)
CALCIUM: 8.7 mg/dL — AB (ref 8.9–10.3)
CO2: 24 mmol/L (ref 22–32)
CREATININE: 1.87 mg/dL — AB (ref 0.61–1.24)
Chloride: 105 mmol/L (ref 101–111)
GFR calc Af Amer: 34 mL/min — ABNORMAL LOW (ref >60)
GFR calc non Af Amer: 29 mL/min — ABNORMAL LOW (ref >60)
GLUCOSE: 139 mg/dL — AB (ref 65–99)
Phosphorus: 3.4 mg/dL (ref 2.5–4.6)
Potassium: 3.6 mmol/L (ref 3.5–5.1)
SODIUM: 138 mmol/L (ref 135–145)

## 2017-06-25 LAB — CBC
HCT: 29.3 % — ABNORMAL LOW (ref 39.0–52.0)
Hemoglobin: 9.8 g/dL — ABNORMAL LOW (ref 13.0–17.0)
MCH: 28.8 pg (ref 26.0–34.0)
MCHC: 33.4 g/dL (ref 30.0–36.0)
MCV: 86.2 fL (ref 78.0–100.0)
PLATELETS: 217 10*3/uL (ref 150–400)
RBC: 3.4 MIL/uL — ABNORMAL LOW (ref 4.22–5.81)
RDW: 14.5 % (ref 11.5–15.5)
WBC: 6 10*3/uL (ref 4.0–10.5)

## 2017-06-25 NOTE — Evaluation (Signed)
Physical Therapy Evaluation Patient Details Name: Cole [name] MRN: [id number] DOB: [birth year] Today's Date: 06/25/2017   History of Present Illness  Pt is a 83 y/o male admitted from [facility] secondary to SOB and LE edema secondary to acute CHF exacerbation. PMH including but not limited to CAD s/p CABG, CKD, CHF, HTN and DM.  Clinical Impression  Pt presented supine in bed with HOB elevated, awake and willing to participate in therapy session. Prior to admission, pt reported that he was at [facility] where he was receiving therapy services. He stated that he could ambulate short distances with use of RW and required assistance with bathing. Pt performed bed mobility with supervision, transferred with min guard and ambulated a very short distance (5') with 1HHA. Pt would continue to benefit from skilled physical therapy services at this time while admitted and after d/c to address the below listed limitations in order to improve overall safety and independence with functional mobility.     Follow Up Recommendations SNF    Equipment Recommendations  None recommended by PT    Recommendations for Other Services       Precautions / Restrictions Restrictions Weight Bearing Restrictions: No      Mobility  Bed Mobility Overal bed mobility: Needs Assistance Bed Mobility: Supine to Sit     Supine to sit: Supervision     General bed mobility comments: supervision for safety  Transfers Overall transfer level: Needs assistance Equipment used: 1 person hand held assist Transfers: Sit to/from Stand Sit to Stand: Min guard         General transfer comment: increased time, min guard for safety and stability with transition into full standing from EOB  Ambulation/Gait Ambulation/Gait assistance: Min assist Ambulation Distance (Feet): 5 Feet Assistive device: 1 person hand held assist Gait Pattern/deviations: Step-through pattern;Decreased step length - right;Decreased step length  - left;Decreased stride length;Shuffle;Antalgic Gait velocity: decreased Gait velocity interpretation: <1.8 ft/sec, indicative of risk for recurrent falls General Gait Details: modest instability and antalgic gait pattern with 1HHA on L and reaching for support surfaces on R; pt stated that he could not walk very far because his "knees would give out"  Stairs            Wheelchair Mobility    Modified Rankin (Stroke Patients Only)       Balance Overall balance assessment: Needs assistance Sitting-balance support: Feet supported Sitting balance-Leahy Scale: Good     Standing balance support: During functional activity;Single extremity supported Standing balance-Leahy Scale: Poor Standing balance comment: pt reliant on external supports                             Pertinent Vitals/Pain Pain Assessment: No/denies pain    Home Living Family/patient expects to be discharged to:: Skilled nursing facility                 Additional Comments: pt is from [place]    Prior Function Level of Independence: Needs assistance   Gait / Transfers Assistance Needed: pt was ambulating short distances with RW  ADL's / Homemaking Assistance Needed: required assistance with bathing, could dress himself        Hand Dominance        Extremity/Trunk Assessment   Upper Extremity Assessment Upper Extremity Assessment: Overall WFL for tasks assessed    Lower Extremity Assessment Lower Extremity Assessment: Generalized weakness       Communication   Communication: No difficulties  Cognition Arousal/Alertness: Awake/alert Behavior During Therapy: WFL for tasks assessed/performed Overall Cognitive Status: Within Functional Limits for tasks assessed                                        General Comments      Exercises     Assessment/Plan    PT Assessment Patient needs continued PT services  PT Problem List Decreased activity  tolerance;Decreased balance;Decreased mobility;Decreased coordination;Decreased knowledge of use of DME;Decreased safety awareness;Decreased knowledge of precautions       PT Treatment Interventions DME instruction;Gait training;Stair training;Functional mobility training;Therapeutic activities;Therapeutic exercise;Balance training;Neuromuscular re-education;Patient/family education    PT Goals (Current goals can be found in the Care Plan section)  Acute Rehab PT Goals Patient Stated Goal: return to [facility] PT Goal Formulation: With patient Time For Goal Achievement: 07/09/17 Potential to Achieve Goals: Good    Frequency Min 2X/week   Barriers to discharge        Co-evaluation               AM-PAC PT "6 Clicks" Daily Activity  Outcome Measure Difficulty turning over in bed (including adjusting bedclothes, sheets and blankets)?: None Difficulty moving from lying on back to sitting on the side of the bed? : None Difficulty sitting down on and standing up from a chair with arms (e.g., wheelchair, bedside commode, etc,.)?: Total Help needed moving to and from a bed to chair (including a wheelchair)?: A Little Help needed walking in hospital room?: A Little Help needed climbing 3-5 steps with a railing? : A Little 6 Click Score: 18    End of Session   Activity Tolerance: Patient tolerated treatment well Patient left: in chair;with call bell/phone within reach;with family/visitor present Nurse Communication: Mobility status PT Visit Diagnosis: Other abnormalities of gait and mobility (R26.89)    Time: 9735-3299 PT Time Calculation (min) (ACUTE ONLY): 12 min   Charges:   PT Evaluation $PT Eval Moderate Complexity: 1 Mod     PT G Codes:        [place], PT, DPT [place] 06/25/2017, 4:37 PM

## 2017-06-25 NOTE — Progress Notes (Signed)
[facility] Rounding Note  Subjective/Interval History :   Patient was sitting up in bed reading the newspaper.  Patient states that his breathing continues to improve.  He ate his breakfast.  He reports minimal leg swelling.  He denies chest pain or orthopnea.  Objective:   Vital signs in last 24 hours: Vitals:   06/24/17 1922 06/24/17 1955 06/24/17 2329 06/25/17 0626  BP:  (!) 153/63 (!) 142/45 (!) 160/60  Pulse:  73 63 64  Resp:  20  20  Temp:  98.9 F (37.2 C)  98.2 F (36.8 C)  TempSrc:  Oral Oral Oral  SpO2: 100% 96%  100%  Weight:    188 lb 9.6 oz (85.5 kg)  Height:       Weight change: -9 lb 9.5 oz (-4.352 kg)  Intake/Output Summary (Last 24 hours) at 06/25/17 0916 Last data filed at 06/25/17 0216  Gross per 24 hour  Intake              240 ml  Output             2200 ml  Net            -1960 ml    Physical Exam:  Blood pressure (!) 160/60, pulse 64, temperature 98.2 F (36.8 C), temperature source Oral, resp. rate 20, height 5\' 8"  (1.727 m), weight 188 lb 9.6 oz (85.5 kg), SpO2 100 %.  Physical Exam  Constitutional: He is well-developed, well-nourished, and in no distress.  Cardiovascular: Normal rate, regular rhythm and normal heart sounds.  Exam reveals no gallop and no friction rub.   No murmur heard. Pulmonary/Chest: Effort normal and breath sounds normal. No respiratory distress. He has no wheezes. He has no rales.  Musculoskeletal:  1+ pitting edema in lower extremities bilaterally L>R   Skin: Skin is warm and dry.    Recent Labs Lab 06/19/17 06/22/17 1608 06/23/17 0108 06/23/17 0614 06/23/17 1441 06/24/17 0255 06/25/17 0315  NA 137 136 137  --  138 136 138  K 4.7 4.9 3.8  --  4.0 3.7 3.6  CL  --  106 108  --  106 107 105  CO2  --  21* 20*  --  21* 22 24  GLUCOSE  --  211* 150*  --  195* 152* 139*  BUN 53* 49* 45*  --  41* 38* 30*  CREATININE 1.9* 2.21* 2.14*  --  2.11* 2.06* 1.87*  CALCIUM  --  8.9 8.8*  --  8.6* 8.3* 8.7*  PHOS  --   --   --   4.0  --   --  3.4    Recent Labs Lab 06/22/17 1608 06/23/17 0108 06/23/17 1441 06/25/17 0315  AST 27 23 22   --   ALT 26 23 25   --   ALKPHOS 70 65 72  --   BILITOT 1.4* 1.5* 1.3*  --   PROT 7.0 6.4* 6.6  --   ALBUMIN 3.2* 3.0* 2.9* 3.0*    Recent Labs Lab 06/22/17 1608  LIPASE 21    Recent Labs Lab 06/23/17 0614 06/23/17 1441 06/24/17 0255 06/25/17 0315  WBC 7.6 9.5 6.5 6.0  NEUTROABS  --  7.3  --   --   HGB 8.8* 9.4* 8.4* 9.8*  HCT 26.8* 28.1* 25.3* 29.3*  MCV 87.6 87.5 86.6 86.2  PLT 168 177 187 217    Recent Labs Lab 06/23/17 0108 06/23/17 0614 06/23/17 1441 06/23/17 1927 06/24/17 0103  TROPONINI 0.25* 0.22*  0.16* 0.13* 0.14*    Recent Labs Lab 06/24/17 0751 06/24/17 1221 06/24/17 1708 06/24/17 2055 06/25/17 0746  GLUCAP 149* 187* 136* 184* 103*      Recent Labs Lab 06/23/17 0108  IRON 17*  TIBC 252  FERRITIN 168    Studies/Results: Dg Chest 2 View  Result Date: 06/23/2017 CLINICAL DATA:  Sob  Weak  Post  Lung  scan EXAM: CHEST  2 VIEW COMPARISON:  06/22/2017 FINDINGS: Status post median sternotomy and CABG. The heart is mildly enlarged. There are no focal consolidations. Small bilateral pleural effusions are present. There is perihilar peribronchial thickening. IMPRESSION: 1. Stable cardiomegaly. 2. Bronchitic changes -stable. 3. Small bilateral effusions. Electronically Signed   By: Nolon Nations M.D.   On: 06/23/2017 13:49   Nm Pulmonary Perf And Vent  Result Date: 06/23/2017 CLINICAL DATA:  Shortness of breath and elevated D-dimer. EXAM: NUCLEAR MEDICINE VENTILATION - PERFUSION LUNG SCAN TECHNIQUE: Ventilation images were obtained in multiple projections using inhaled aerosol Tc-14m DTPA. Perfusion images were obtained in multiple projections after intravenous injection of Tc-38m MAA. RADIOPHARMACEUTICALS:  30 mCi Technetium-13m DTPA aerosol inhalation and 4.0 mCi Technetium-73m MAA IV COMPARISON:  Chest x-ray yesterday and earlier  today. FINDINGS: Ventilation: No significant bili tori defects are identified. Perfusion: Perfusion imaging is unremarkable without evidence of focal perfusion defect. IMPRESSION: Normal ventilation and perfusion scintigraphy demonstrating no evidence of pulmonary embolism. Electronically Signed   By: Aletta Edouard M.D.   On: 06/23/2017 17:51   Medications . sodium chloride    . sodium chloride    . [START ON 06/29/2017] ferumoxytol     . aspirin EC  81 mg Oral Daily  . cholecalciferol  1,000 Units Oral Daily  . docusate sodium  100 mg Oral Daily  . furosemide  80 mg Oral Daily  . heparin subcutaneous  5,000 Units Subcutaneous Q8H  . hydrALAZINE  10 mg Oral BID  . insulin aspart  0-5 Units Subcutaneous QHS  . insulin aspart  0-9 Units Subcutaneous TID WC  . insulin glargine  30 Units Subcutaneous QHS  . ipratropium-albuterol  3 mL Nebulization BID  . isosorbide mononitrate  30 mg Oral Daily  . latanoprost  1 drop Both Eyes QHS  . loratadine  10 mg Oral Daily  . metoprolol tartrate  50 mg Oral BID  . multivitamin with minerals  1 tablet Oral Daily  . pantoprazole  40 mg Oral BID  . potassium chloride  20 mEq Oral BID  . rosuvastatin  5 mg Oral Once per day on Mon Wed Fri  . senna-docusate  2 tablet Oral QHS  . sodium chloride flush  3 mL Intravenous Q12H  . sodium chloride flush  3 mL Intravenous Q12H  . tamsulosin  0.4 mg Oral QHS  . vitamin B-12  1,000 mcg Oral Daily    Background:  83 y.o. year-old male with history of CKD3, A.fib, CAD, diastolic CHF, DM 2, HTN admitted for acute on chronic CHF.   In June 2018 his BUN was 49 and creatinine was 1.8.  Admitted July for AKI on CKD with creatinine up to 4.14 (Review of MD notes from SNF prior to that adw/creatinine of 4.14   - was being seen frequently at [facility] due to SOB and leg swelling, with manipulations in his torsemide up and down. SOB/edema noted every visit PTA) He was treated with IVF, diuretics held, saw Dr. Justin Mend last week for  hospital follow up and was advised not to restart diuretics,  subsequently gained 14 lb with SOB, abdominal distension. Admitted from Dr. Thompson Caul office for diuresis.   ASSESSMENT/RECOMMENDATIONS Acute on CKD stage IV Patient presented with creatinine of 2.14.  Baseline creatinine appears to be 1.8-2.0.   Diuretics were held outpatient after July admission for AKI on CKD (creatinine up to 4.14) (had been getting torsemide at varying doses throughout the month of July due to SOB and lower extremity swelling) Inpt on lasix 60 IV BID for acute dCHF --> currently on oral lasix 80mg , creatinine currently 1.87 and at baseline.   Improvement seen with diuresis.   UOP net 4L.   Currently 188lb, admission weight of 198lb.   Per cardiology weight goal of 190lb. Will monitor for now.        Iron deficiency Anemia Hemoglobin 9.8 and stable, Iron 17, TIBC 252 and Saturation ratio 7.   gave IV feraheme 8/8, and recommend again in one week  Acute Respiratory Failure Appears to be acute on chronic diastolic heart failure  Chest x-ray does not show signs of pulmonary edema.   BNP > 1,100 > IV lasix. Now on oral lasix  Coronary artery disease involving coronary bypass graft of native heart with angina pectoris Patient presents with elevated troponin, peak of 0.25.   Paroxysmal atrial fibrillation Poor chronic anticoagulation patient.   On metoprolol 50mg  daily at home.   Off IV heparin  On metoprolol.   HTN  BPs labile   Currently on low dose hydralazine  Prior burn/foot wound  Edema in left foot greater than right.  Appears healed.  DM Uncontrolled. On SSI    Kalman Shan PGY-2 Internal medicine  [phone] Pager 06/25/2017, 9:16 AM   Agree with the assessment and plan in the note of Dr. Heber [place]. Creatinine stable to improved (at baseline for him) since admission with diuresis - net neg 4+ liters. Weight down 10 lb so concordant with I/O records. Has been transitioned to po lasix  80 mg daily dosed yesterday and this AM - UOP 2.95 liters/24 hours, 400 recorded so far today. Will need to be sure UOP/weight stable on po diuretic before back to SNF. Agree wholly with Dr. Victorino December note of yesterday that will may at some point need to accept some degree of compromise of renal function in order to adequately manage his heart failure (he suggests allowing creatinine to get as high as 3). Pt is unsure if would undergo dialysis in the event that HF could not be managed without severe compromise in kidney function. Fortunately renal stable and not needing to make that decision.   I also would think it best in his particular case that one service (rather than multiple) manage his outpatient diuretics (in this case cardiology) and recruit [place] back into the picture if renal function compromised.  At this time renal will sign off but please call us back if questions.      Jamal Maes, MD [place] Pager 06/25/2017, 1:37 PM

## 2017-06-25 NOTE — Clinical Social Work Note (Signed)
Clinical Social Worker continuing to follow patient and family for support and discharge planning needs.  Patient plans to return to [facility] and is able to go back once medically stable.  CSW spoke to facility admissions coordinator who will make arrangements for weekend admission.  CSW remains available for support and to facilitate patient discharge needs once medically stable.  Cole [name], [place]

## 2017-06-25 NOTE — Plan of Care (Signed)
Problem: Pain Managment: Goal: General experience of comfort will improve Outcome: Progressing Pt denies chest pain.  Continue to monitor.

## 2017-06-25 NOTE — Plan of Care (Signed)
Problem: Pain Managment: Goal: General experience of comfort will improve Outcome: Progressing Continue to monitor pain using the 0-10 pain scale

## 2017-06-25 NOTE — Progress Notes (Signed)
Pt converted to atrial flutter and is having bigeminy PACs.  PA notified.

## 2017-06-25 NOTE — Progress Notes (Signed)
[facility]  PROGRESS NOTE  Cole [name] SHF:[id number] DOB: 1920/02/09 DOA: 06/22/2017 PCP: [name], MD   Brief HPI:    83 y.o. male with medical history significant of  CKD3, A.fib, CAD, diastolic CHF, lower extremity balance osteomyelitis, DM 2, HTN. Patient admitted with acute on chronic CHF exacerbation.    Subjective   Patient seen and examined, denies chest pain. No shortness of breath. Heart rate is adequately controlled.   Assessment/Plan:     1. Acute on chronic respiratory failure- secondary to acute diastolic CHF. Patient was started on  IV diuresis with Lasix 60 mg every 12 hours. Lasix has now been changed to 80 mg by mouth daily. Cardiology following. 2. Persistent atrial fibrillation- CHA2DS2VASc score of 6. Patient was started on heparin per pharmacy as per cardiology recommendation currently heparin has been discontinued by cardiology as patient is not a good candidate for anticoagulation.,  Rate is controlled with metoprolol. 3. Diabetes mellitus- continue sliding scale insulin with NovoLog, Lantus 30 units subcutaneous daily.  blood glucose is stable 170, 103 4. CAD/ Elevated troponin- patient has elevated troponin, 0.22, 0.16, likely from demand ischemia. Patient is not a candidate for cardiac catheterization due to renal functions and DNR  status as per cardiology. Continue aspirin, statin, isosorbide. 5. Acute on CK D stage IV- patient's baseline creatinine is around 1.8 2.0, today creatinine is 1.87. Improved with IV diuresis. Nephrology on board. Follow BMP in a.m.     DVT prophylaxis: Heparin   Code Status: DO NOT RESUSCITATE  Family Communication: No family present at bedside   Disposition Plan: Likely skilled nursing facility, PT evaluation ordered   Consultants:  Cardiology  Procedures:  None  Continuous infusions . sodium chloride    . sodium chloride    . [START ON 06/29/2017] ferumoxytol        Antibiotics:    Anti-infectives    None       Objective   Vitals:   06/24/17 2329 06/25/17 0626 06/25/17 1059 06/25/17 1222  BP: (!) 142/45 (!) 160/60 (!) 181/64 140/63  Pulse: 63 64 70 66  Resp:  20    Temp:  98.2 F (36.8 C)    TempSrc: Oral Oral    SpO2:  100% 100% 100%  Weight:  85.5 kg (188 lb 9.6 oz)    Height:        Intake/Output Summary (Last 24 hours) at 06/25/17 1403 Last data filed at 06/25/17 1348  Gross per 24 hour  Intake              720 ml  Output             2450 ml  Net            -1730 ml   Filed Weights   06/23/17 1311 06/24/17 0349 06/25/17 0626  Weight: 89.9 kg (198 lb 3.1 oz) 88.3 kg (194 lb 9.6 oz) 85.5 kg (188 lb 9.6 oz)     Physical Examination:   Physical Exam: Eyes: No icterus, extraocular muscles intact  Mouth: Oral mucosa is moist, no lesions on palate,  Neck: Supple, no deformities, masses, or tenderness Lungs: Normal respiratory effort, bilateral clear to auscultation, no crackles or wheezes.  Heart: Regular rate and rhythm, S1 and S2 normal, no murmurs, rubs auscultated Abdomen: BS normoactive,soft,nondistended,non-tender to palpation,no organomegaly Extremities: Trace edema of the lower extremities Neuro : Alert and oriented to time, place and person, No focal deficits Skin: No rashes seen on  exam  Skin: No rashes seen on exam     Data Reviewed: I have personally reviewed following labs and imaging studies  CBG:  Recent Labs Lab 06/24/17 1221 06/24/17 1708 06/24/17 2055 06/25/17 0746 06/25/17 1131  GLUCAP 187* 136* 184* 103* 170*    CBC:  Recent Labs Lab 06/23/17 0108 06/23/17 0614 06/23/17 1441 06/24/17 0255 06/25/17 0315  WBC 9.2 7.6 9.5 6.5 6.0  NEUTROABS  --   --  7.3  --   --   HGB 9.4* 8.8* 9.4* 8.4* 9.8*  HCT 28.6* 26.8* 28.1* 25.3* 29.3*  MCV 86.9 87.6 87.5 86.6 86.2  PLT 180 168 177 187 253    Basic Metabolic Panel:  Recent Labs Lab 06/22/17 1608 06/23/17 0108 06/23/17 0614 06/23/17 1441  06/24/17 0255 06/25/17 0315  NA 136 137  --  138 136 138  K 4.9 3.8  --  4.0 3.7 3.6  CL 106 108  --  106 107 105  CO2 21* 20*  --  21* 22 24  GLUCOSE 211* 150*  --  195* 152* 139*  BUN 49* 45*  --  41* 38* 30*  CREATININE 2.21* 2.14*  --  2.11* 2.06* 1.87*  CALCIUM 8.9 8.8*  --  8.6* 8.3* 8.7*  MG  --   --  1.9 1.8  --   --   PHOS  --   --  4.0  --   --  3.4    No results found for this or any previous visit (from the past 240 hour(s)).   Liver Function Tests:  Recent Labs Lab 06/22/17 1608 06/23/17 0108 06/23/17 1441 06/25/17 0315  AST 27 23 22   --   ALT 26 23 25   --   ALKPHOS 70 65 72  --   BILITOT 1.4* 1.5* 1.3*  --   PROT 7.0 6.4* 6.6  --   ALBUMIN 3.2* 3.0* 2.9* 3.0*    Recent Labs Lab 06/22/17 1608  LIPASE 21   No results for input(s): AMMONIA in the last 168 hours.  Cardiac Enzymes:  Recent Labs Lab 06/23/17 0108 06/23/17 0614 06/23/17 1441 06/23/17 1927 06/24/17 0103  TROPONINI 0.25* 0.22* 0.16* 0.13* 0.14*   BNP (last 3 results)  Recent Labs  06/11/17 1907 06/23/17 0108 06/23/17 1441  BNP 452.4* 1,149.9* 1,038.2*    ProBNP (last 3 results) No results for input(s): PROBNP in the last 8760 hours.    Studies: Nm Pulmonary Perf And Vent  Result Date: 06/23/2017 CLINICAL DATA:  Shortness of breath and elevated D-dimer. EXAM: NUCLEAR MEDICINE VENTILATION - PERFUSION LUNG SCAN TECHNIQUE: Ventilation images were obtained in multiple projections using inhaled aerosol Tc-59m DTPA. Perfusion images were obtained in multiple projections after intravenous injection of Tc-64m MAA. RADIOPHARMACEUTICALS:  30 mCi Technetium-62m DTPA aerosol inhalation and 4.0 mCi Technetium-73m MAA IV COMPARISON:  Chest x-ray yesterday and earlier today. FINDINGS: Ventilation: No significant bili tori defects are identified. Perfusion: Perfusion imaging is unremarkable without evidence of focal perfusion defect. IMPRESSION: Normal ventilation and perfusion scintigraphy  demonstrating no evidence of pulmonary embolism. Electronically Signed   By: Aletta Edouard M.D.   On: 06/23/2017 17:51    Scheduled Meds: . aspirin EC  81 mg Oral Daily  . cholecalciferol  1,000 Units Oral Daily  . docusate sodium  100 mg Oral Daily  . furosemide  80 mg Oral Daily  . heparin subcutaneous  5,000 Units Subcutaneous Q8H  . hydrALAZINE  10 mg Oral BID  . insulin aspart  0-5  Units Subcutaneous QHS  . insulin aspart  0-9 Units Subcutaneous TID WC  . insulin glargine  30 Units Subcutaneous QHS  . ipratropium-albuterol  3 mL Nebulization BID  . isosorbide mononitrate  30 mg Oral Daily  . latanoprost  1 drop Both Eyes QHS  . loratadine  10 mg Oral Daily  . metoprolol tartrate  50 mg Oral BID  . multivitamin with minerals  1 tablet Oral Daily  . pantoprazole  40 mg Oral BID  . potassium chloride  20 mEq Oral BID  . rosuvastatin  5 mg Oral Once per day on Mon Wed Fri  . senna-docusate  2 tablet Oral QHS  . sodium chloride flush  3 mL Intravenous Q12H  . sodium chloride flush  3 mL Intravenous Q12H  . tamsulosin  0.4 mg Oral QHS  . vitamin B-12  1,000 mcg Oral Daily      Time spent: 25 min  [facility] Pager [phone]. If 7PM-7AM, please contact night-coverage at [email or web address], Office  [phone]  password [email or web address]  06/25/2017, 2:03 PM  LOS: 3 days

## 2017-06-25 NOTE — Progress Notes (Signed)
Progress Note  Patient Name: Cole [name] Date of Encounter: 06/25/2017  Primary Cardiologist: Tamala Julian  Subjective   Feels well, edema is almost gone, does not have orthopnea or exertional dyspnea when walking in the room. Has a good appetite. Despite excellent diuresis (over 2 L last 24 hours and 4.5 L since admission) his renal parameters are stable or better compared to admission. Weight is approaching his estimated "dry weight" of about 185 pounds  Inpatient Medications    Scheduled Meds: . aspirin EC  81 mg Oral Daily  . cholecalciferol  1,000 Units Oral Daily  . docusate sodium  100 mg Oral Daily  . furosemide  80 mg Oral Daily  . heparin subcutaneous  5,000 Units Subcutaneous Q8H  . hydrALAZINE  10 mg Oral BID  . insulin aspart  0-5 Units Subcutaneous QHS  . insulin aspart  0-9 Units Subcutaneous TID WC  . insulin glargine  30 Units Subcutaneous QHS  . ipratropium-albuterol  3 mL Nebulization BID  . isosorbide mononitrate  30 mg Oral Daily  . latanoprost  1 drop Both Eyes QHS  . loratadine  10 mg Oral Daily  . metoprolol tartrate  50 mg Oral BID  . multivitamin with minerals  1 tablet Oral Daily  . pantoprazole  40 mg Oral BID  . potassium chloride  20 mEq Oral BID  . rosuvastatin  5 mg Oral Once per day on Mon Wed Fri  . senna-docusate  2 tablet Oral QHS  . sodium chloride flush  3 mL Intravenous Q12H  . sodium chloride flush  3 mL Intravenous Q12H  . tamsulosin  0.4 mg Oral QHS  . vitamin B-12  1,000 mcg Oral Daily   Continuous Infusions: . sodium chloride    . sodium chloride    . [START ON 06/29/2017] ferumoxytol     PRN Meds: sodium chloride, sodium chloride, acetaminophen **OR** acetaminophen, albuterol, bisacodyl, ondansetron **OR** ondansetron (ZOFRAN) IV, sodium chloride flush, sodium chloride flush, technetium TC 62M diethylenetriame-pentaacetic acid   Vital Signs    Vitals:   06/24/17 2329 06/25/17 0626 06/25/17 1059 06/25/17 1222  BP: (!) 142/45  (!) 160/60 (!) 181/64 140/63  Pulse: 63 64 70 66  Resp:  20    Temp:  98.2 F (36.8 C)    TempSrc: Oral Oral    SpO2:  100% 100% 100%  Weight:  188 lb 9.6 oz (85.5 kg)    Height:        Intake/Output Summary (Last 24 hours) at 06/25/17 1336 Last data filed at 06/25/17 1117  Gross per 24 hour  Intake              480 ml  Output             2050 ml  Net            -1570 ml   Filed Weights   06/23/17 1311 06/24/17 0349 06/25/17 0626  Weight: 198 lb 3.1 oz (89.9 kg) 194 lb 9.6 oz (88.3 kg) 188 lb 9.6 oz (85.5 kg)    Telemetry    Normal sinus rhythm - Personally Reviewed  ECG    No new tracing - Personally Reviewed  Physical Exam  Eating breakfast, relaxed and comfortable GEN: No acute distress.   Neck: No JVD (3-4 cm) Cardiac:  irregular, no diastolic murmurs, rubs, or gallops. 2/6 holosystolic murmur at the left lower sternal border is unchanged Respiratory: Clear to auscultation bilaterally. GI: Soft, nontender, non-distended  MS:  trivial ankle edema; No deformity. Neuro:  Nonfocal  Psych: Normal affect   Labs    Chemistry Recent Labs Lab 06/22/17 1608 06/23/17 0108 06/23/17 1441 06/24/17 0255 06/25/17 0315  NA 136 137 138 136 138  K 4.9 3.8 4.0 3.7 3.6  CL 106 108 106 107 105  CO2 21* 20* 21* 22 24  GLUCOSE 211* 150* 195* 152* 139*  BUN 49* 45* 41* 38* 30*  CREATININE 2.21* 2.14* 2.11* 2.06* 1.87*  CALCIUM 8.9 8.8* 8.6* 8.3* 8.7*  PROT 7.0 6.4* 6.6  --   --   ALBUMIN 3.2* 3.0* 2.9*  --  3.0*  AST 27 23 22   --   --   ALT 26 23 25   --   --   ALKPHOS 70 65 72  --   --   BILITOT 1.4* 1.5* 1.3*  --   --   GFRNONAA 24* 25* 25* 26* 29*  GFRAA 28* 29* 29* 30* 34*  ANIONGAP 9 9 11 7 9      Hematology Recent Labs Lab 06/23/17 1441 06/24/17 0255 06/25/17 0315  WBC 9.5 6.5 6.0  RBC 3.21* 2.92* 3.40*  HGB 9.4* 8.4* 9.8*  HCT 28.1* 25.3* 29.3*  MCV 87.5 86.6 86.2  MCH 29.3 28.8 28.8  MCHC 33.5 33.2 33.4  RDW 14.9 14.7 14.5  PLT 177 187 217     Cardiac Enzymes Recent Labs Lab 06/23/17 0614 06/23/17 1441 06/23/17 1927 06/24/17 0103  TROPONINI 0.22* 0.16* 0.13* 0.14*    Recent Labs Lab 06/22/17 1618  TROPIPOC 0.21*     BNP Recent Labs Lab 06/23/17 0108 06/23/17 1441  BNP 1,149.9* 1,038.2*     DDimer  Recent Labs Lab 06/22/17 1936  DDIMER 2.06*     Radiology    Nm Pulmonary Perf And Vent  Result Date: 06/23/2017 CLINICAL DATA:  Shortness of breath and elevated D-dimer. EXAM: NUCLEAR MEDICINE VENTILATION - PERFUSION LUNG SCAN TECHNIQUE: Ventilation images were obtained in multiple projections using inhaled aerosol Tc-63m DTPA. Perfusion images were obtained in multiple projections after intravenous injection of Tc-29m MAA. RADIOPHARMACEUTICALS:  30 mCi Technetium-58m DTPA aerosol inhalation and 4.0 mCi Technetium-66m MAA IV COMPARISON:  Chest x-ray yesterday and earlier today. FINDINGS: Ventilation: No significant bili tori defects are identified. Perfusion: Perfusion imaging is unremarkable without evidence of focal perfusion defect. IMPRESSION: Normal ventilation and perfusion scintigraphy demonstrating no evidence of pulmonary embolism. Electronically Signed   By: Aletta Edouard M.D.   On: 06/23/2017 17:51    Cardiac Studies   Echocardiogram 10/20/16: Study Conclusions - Left ventricle: The cavity size was normal. Wall thickness was increased in a pattern of mild LVH. Systolic function was mildly reduced. The estimated ejection fraction was in the range of 45% to 50%. Akinesis of the basal-midinferior myocardium. Doppler parameters are consistent with restrictive physiology, indicative of decreased left ventricular diastolic compliance and/or increased left atrial pressure. Doppler parameters are consistent with elevated mean left atrial filling pressure. - Ventricular septum: Septal motion showed paradox. These changes are consistent with a post-thoracotomy state. - Mitral valve: There  was mild to moderate regurgitation directed centrally. - Left atrium: The atrium was moderately to severely dilated. - Right ventricle: The cavity size was mildly dilated.h  Patient Profile     83 y.o. male with CAD, previous bypass surgery, hypertension, history of previous persistent atrial fibrillation, mildly depressed left ventricular systolic function presenting with acute exacerbation of combined systolic and diastolic heart failure after introduction of diuretics for her worsening renal function parameters  Assessment & Plan    1. CHF: I think we can use today's weight as upper limit target for his "dry weight". Probably optimal range to keep balance between renal function and heart failure is 185-190 pounds.  in order to avoid acute heart failure exacerbation, would avoid completely stopping his diuretics based on renal function. 2. CAD: After receiving diuretics, he has not had any further chest discomfort. No plan at this point to evaluate for coronary insufficiency. 3. CKD4: Despite his very advanced age, he is remarkably well-preserved from a cognitive point of view and is very functional. Discussed with Dr. Lorrene Reid, if necessary she would offer him chronic hemodialysis, although the patient himself is uncertain about that option. 4. AFib: Not a candidate for chronic anticoagulation due to recurrent problems with iron deficiency. Adequate rate control  5. HTN: Although his systolic blood pressure remains mildly elevated, he has very low diastolic blood pressure and I would not increase his antihypertensives any further.  Signed, Sanda Klein, MD  06/25/2017, 1:36 PM

## 2017-06-25 NOTE — Care Management Important Message (Signed)
Important Message  Patient Details  Name: Cole [name] MRN: [id number] Date of Birth: Jul 08, 1920   Medicare Important Message Given:  Yes    Cole [name] 06/25/2017, 12:45 PM

## 2017-06-25 NOTE — Clinical Social Work Note (Signed)
Clinical Social Work Assessment  Patient Details  Name: Cole [name] MRN: [id number] Date of Birth: 11-Dec-1919  Date of referral:  06/24/17               Reason for consult:  Facility Placement                Permission sought to share information with:  Family Supports Permission granted to share information::  Yes, Verbal Permission Granted  Name::     Dorena Dew  Relationship::  Daughter  Contact Information:  [phone]  Housing/Transportation Living arrangements for the past 2 months:  [place] of Information:  Patient Patient Interpreter Needed:  None Criminal Activity/Legal Involvement Pertinent to Current Situation/Hospitalization:  No - Comment as needed Significant Relationships:  Adult Children Lives with:  Facility Resident Do you feel safe going back to the place where you live?  Yes Need for family participation in patient care:  Yes (Comment)  Care giving concerns:  Patient with no family/friends at bedside.  Administrator from [place] to contact patient daughter to further discuss insurance barriers but should not be a barrier to discharge.   Social Worker assessment / plan:  Holiday representative met with patient at bedside to offer support and discuss patient needs at discharge.  Patient states that he is a current resident at [facility] and would like to return at discharge.  Patient does acknowledge that his new insurance provider is not in network with [facility], but he would like to continue to pursue out of network benefits and stay at [facility].  CSW updated admissions coordinator for [facility] who is agreeable with patient plans and will just follow up with daughter to notify.  CSW remains available for support and to facilitate patient discharge needs once medically stable.  Employment status:  Retired Nurse, adult PT Recommendations:  [place] / Referral to  community resources:  [place]  Patient/Family's Response to care:  Patient verbalized understanding of CSW role and appreciation for support and concern.  Patient is agreeable with return to [facility].  Patient/Family's Understanding of and Emotional Response to Diagnosis, Current Treatment, and Prognosis:  Patient understanding that he is not able to return home in his current condition and is comforted by remaining in the same facility.  Patient is appropriate and understanding of his medical condition and plans to update his family by telephone.  Emotional Assessment Appearance:  Appears younger than stated age Attitude/Demeanor/Rapport:   (Engaged and Appropriate) Affect (typically observed):  Accepting, Calm, Appropriate, Hopeful, Pleasant Orientation:  Oriented to Self, Oriented to Situation, Oriented to Place, Oriented to  Time Alcohol / Substance use:  Not Applicable Psych involvement (Current and /or in the community):  No (Comment)  Discharge Needs  Concerns to be addressed:  No discharge needs identified Readmission within the last 30 days:  No Current discharge risk:  None Barriers to Discharge:  Continued Medical Work up  [employer], [place]

## 2017-06-26 DIAGNOSIS — E1169 Type 2 diabetes mellitus with other specified complication: Secondary | ICD-10-CM

## 2017-06-26 DIAGNOSIS — I208 Other forms of angina pectoris: Secondary | ICD-10-CM

## 2017-06-26 DIAGNOSIS — E785 Hyperlipidemia, unspecified: Secondary | ICD-10-CM

## 2017-06-26 LAB — RENAL FUNCTION PANEL
Albumin: 2.9 g/dL — ABNORMAL LOW (ref 3.5–5.0)
Anion gap: 11 (ref 5–15)
BUN: 29 mg/dL — ABNORMAL HIGH (ref 6–20)
CO2: 24 mmol/L (ref 22–32)
Calcium: 8.8 mg/dL — ABNORMAL LOW (ref 8.9–10.3)
Chloride: 104 mmol/L (ref 101–111)
Creatinine, Ser: 1.86 mg/dL — ABNORMAL HIGH (ref 0.61–1.24)
GFR calc Af Amer: 34 mL/min — ABNORMAL LOW (ref >60)
GFR calc non Af Amer: 29 mL/min — ABNORMAL LOW (ref >60)
GLUCOSE: 105 mg/dL — AB (ref 65–99)
POTASSIUM: 3.8 mmol/L (ref 3.5–5.1)
Phosphorus: 3.8 mg/dL (ref 2.5–4.6)
SODIUM: 139 mmol/L (ref 135–145)

## 2017-06-26 LAB — CBC
HEMATOCRIT: 29.1 % — AB (ref 39.0–52.0)
HEMOGLOBIN: 9.6 g/dL — AB (ref 13.0–17.0)
MCH: 28.5 pg (ref 26.0–34.0)
MCHC: 33 g/dL (ref 30.0–36.0)
MCV: 86.4 fL (ref 78.0–100.0)
Platelets: 237 10*3/uL (ref 150–400)
RBC: 3.37 MIL/uL — ABNORMAL LOW (ref 4.22–5.81)
RDW: 14.5 % (ref 11.5–15.5)
WBC: 6.9 10*3/uL (ref 4.0–10.5)

## 2017-06-26 LAB — GLUCOSE, CAPILLARY
GLUCOSE-CAPILLARY: 101 mg/dL — AB (ref 65–99)
Glucose-Capillary: 63 mg/dL — ABNORMAL LOW (ref 65–99)

## 2017-06-26 MED ORDER — POTASSIUM CHLORIDE CRYS ER 20 MEQ PO TBCR: 20.0000 meq | EXTENDED_RELEASE_TABLET | Freq: Two times a day (BID) | ORAL | Status: DC

## 2017-06-26 MED ORDER — FUROSEMIDE 80 MG PO TABS: 80.0000 mg | ORAL_TABLET | Freq: Every day | ORAL | 2 refills | Status: DC

## 2017-06-26 MED ORDER — ASPIRIN 81 MG PO TBEC: 81.0000 mg | DELAYED_RELEASE_TABLET | Freq: Every day | ORAL | Status: DC

## 2017-06-26 NOTE — Progress Notes (Addendum)
Progress Note  Patient Name: Cole [name] Date of Encounter: 06/26/2017  Primary Cardiologist: Tamala Julian  Subjective   Feels better, edema is almost gone, only mild edema on both feet is left. Excellent diuresis (over 1.6 L last 24 hours and 6 L since admission) his renal parameters are stable.  Inpatient Medications    Scheduled Meds: . aspirin EC  81 mg Oral Daily  . cholecalciferol  1,000 Units Oral Daily  . docusate sodium  100 mg Oral Daily  . furosemide  80 mg Oral Daily  . heparin subcutaneous  5,000 Units Subcutaneous Q8H  . hydrALAZINE  10 mg Oral BID  . insulin aspart  0-5 Units Subcutaneous QHS  . insulin aspart  0-9 Units Subcutaneous TID WC  . insulin glargine  30 Units Subcutaneous QHS  . ipratropium-albuterol  3 mL Nebulization BID  . isosorbide mononitrate  30 mg Oral Daily  . latanoprost  1 drop Both Eyes QHS  . loratadine  10 mg Oral Daily  . metoprolol tartrate  50 mg Oral BID  . multivitamin with minerals  1 tablet Oral Daily  . pantoprazole  40 mg Oral BID  . potassium chloride  20 mEq Oral BID  . rosuvastatin  5 mg Oral Once per day on Mon Wed Fri  . senna-docusate  2 tablet Oral QHS  . sodium chloride flush  3 mL Intravenous Q12H  . sodium chloride flush  3 mL Intravenous Q12H  . tamsulosin  0.4 mg Oral QHS  . vitamin B-12  1,000 mcg Oral Daily   Continuous Infusions: . sodium chloride    . sodium chloride    . [START ON 06/29/2017] ferumoxytol     PRN Meds: sodium chloride, sodium chloride, acetaminophen **OR** acetaminophen, albuterol, bisacodyl, ondansetron **OR** ondansetron (ZOFRAN) IV, sodium chloride flush, sodium chloride flush, technetium TC 60M diethylenetriame-pentaacetic acid   Vital Signs    Vitals:   06/25/17 1222 06/25/17 1934 06/25/17 1950 06/26/17 0526  BP: 140/63 (!) 158/61  (!) 132/50  Pulse: 66 64  (!) 56  Resp:  18  18  Temp:  98.7 F (37.1 C)  98 F (36.7 C)  TempSrc:  Oral  Oral  SpO2: 100% 100% 99% 100%  Weight:     187 lb 9.6 oz (85.1 kg)  Height:        Intake/Output Summary (Last 24 hours) at 06/26/17 0735 Last data filed at 06/26/17 0500  Gross per 24 hour  Intake              720 ml  Output             2350 ml  Net            -1630 ml   Filed Weights   06/24/17 0349 06/25/17 0626 06/26/17 0526  Weight: 194 lb 9.6 oz (88.3 kg) 188 lb 9.6 oz (85.5 kg) 187 lb 9.6 oz (85.1 kg)    Telemetry    Normal sinus rhythm - Personally Reviewed  ECG    No new tracing - Personally Reviewed  Physical Exam  Eating breakfast, relaxed and comfortable GEN: No acute distress.   Neck: No JVD (3-4 cm) Cardiac:  irregular, no diastolic murmurs, rubs, or gallops. 2/6 holosystolic murmur at the left lower sternal border is unchanged Respiratory: Clear to auscultation bilaterally. GI: Soft, nontender, non-distended  MS:  trivial ankle edema; No deformity. Neuro:  Nonfocal  Psych: Normal affect   Labs    Chemistry  Recent  Labs Lab 06/22/17 1608 06/23/17 0108 06/23/17 1441 06/24/17 0255 06/25/17 0315 06/26/17 0416  NA 136 137 138 136 138 139  K 4.9 3.8 4.0 3.7 3.6 3.8  CL 106 108 106 107 105 104  CO2 21* 20* 21* 22 24 24   GLUCOSE 211* 150* 195* 152* 139* 105*  BUN 49* 45* 41* 38* 30* 29*  CREATININE 2.21* 2.14* 2.11* 2.06* 1.87* 1.86*  CALCIUM 8.9 8.8* 8.6* 8.3* 8.7* 8.8*  PROT 7.0 6.4* 6.6  --   --   --   ALBUMIN 3.2* 3.0* 2.9*  --  3.0* 2.9*  AST 27 23 22   --   --   --   ALT 26 23 25   --   --   --   ALKPHOS 70 65 72  --   --   --   BILITOT 1.4* 1.5* 1.3*  --   --   --   GFRNONAA 24* 25* 25* 26* 29* 29*  GFRAA 28* 29* 29* 30* 34* 34*  ANIONGAP 9 9 11 7 9 11      Hematology  Recent Labs Lab 06/24/17 0255 06/25/17 0315 06/26/17 0416  WBC 6.5 6.0 6.9  RBC 2.92* 3.40* 3.37*  HGB 8.4* 9.8* 9.6*  HCT 25.3* 29.3* 29.1*  MCV 86.6 86.2 86.4  MCH 28.8 28.8 28.5  MCHC 33.2 33.4 33.0  RDW 14.7 14.5 14.5  PLT 187 217 237    Cardiac Enzymes  Recent Labs Lab 06/23/17 0614  06/23/17 1441 06/23/17 1927 06/24/17 0103  TROPONINI 0.22* 0.16* 0.13* 0.14*     Recent Labs Lab 06/22/17 1618  TROPIPOC 0.21*     BNP  Recent Labs Lab 06/23/17 0108 06/23/17 1441  BNP 1,149.9* 1,038.2*     DDimer   Recent Labs Lab 06/22/17 1936  DDIMER 2.06*     Radiology    No results found.  Cardiac Studies   Echocardiogram 10/20/16: Study Conclusions - Left ventricle: The cavity size was normal. Wall thickness was increased in a pattern of mild LVH. Systolic function was mildly reduced. The estimated ejection fraction was in the range of 45% to 50%. Akinesis of the basal-midinferior myocardium. Doppler parameters are consistent with restrictive physiology, indicative of decreased left ventricular diastolic compliance and/or increased left atrial pressure. Doppler parameters are consistent with elevated mean left atrial filling pressure. - Ventricular septum: Septal motion showed paradox. These changes are consistent with a post-thoracotomy state. - Mitral valve: There was mild to moderate regurgitation directed centrally. - Left atrium: The atrium was moderately to severely dilated. - Right ventricle: The cavity size was mildly dilated.h  Patient Profile     83 y.o. male with CAD, previous bypass surgery, hypertension, history of previous persistent atrial fibrillation, mildly depressed left ventricular systolic function presenting with acute exacerbation of combined systolic and diastolic heart failure after introduction of diuretics for her worsening renal function parameters   Assessment & Plan    1. CHF: Weight is approaching his estimated "dry weight" of about 185 pounds, he can be discharged from cardiac standpoint, we will arrange for an outpatient follow up. Discharge on lasix 80 mg po daily.  2. CAD: After receiving diuretics, he has not had any further chest discomfort. No plan at this point to evaluate for coronary  insufficiency. 3. CKD4: Despite his very advanced age, he is remarkably well-preserved from a cognitive point of view and is very functional. Discussed with Dr. Lorrene Reid, if necessary she would offer him chronic hemodialysis, although the patient himself is  uncertain about that option. 4. AFib: Not a candidate for chronic anticoagulation due to recurrent problems with iron deficiency. Adequate rate control  5. HTN: now controlled.  Signed, Ena Dawley, MD  06/26/2017, 7:35 AM

## 2017-06-26 NOTE — Progress Notes (Signed)
Clinical Social Worker facilitated patient discharge including contacting patient family and facility to confirm patient discharge plans.  Clinical information faxed to facility and family agreeable with plan.  CSW arranged ambulance transport via [facility] to [place] .  RN to call [phone] (pt will be placed in rm# [phone]) for report prior to discharge.  Clinical Social Worker will sign off for now as social work intervention is no longer needed. Please consult [place] again if new need arises.  Rhea Pink, MSW, [place]

## 2017-06-26 NOTE — Care Management Note (Signed)
Case Management Note  Patient Details  Name: Cole [name] MRN: [id number] Date of Birth: May 25, 1920  Subjective/Objective:                 Patient with order to DC to SNF.    Action/Plan:  DC to SNF as facilitated by CSW.  Expected Discharge Date:  06/26/17               Expected Discharge Plan:  Skilled Nursing Facility  In-House Referral:  Clinical Social Work  Discharge planning Services  CM Consult  Post Acute Care Choice:    Choice offered to:     DME Arranged:    DME Agency:     HH Arranged:    [place] Agency:     Status of Service:  Completed, signed off  If discussed at [employer], dates discussed:    Additional Comments:  Carles Collet, RN 06/26/2017, 11:56 AM

## 2017-06-26 NOTE — Discharge Summary (Signed)
Physician Discharge Summary  Cole [name] ZOX:[id number] DOB: 1920-01-07 DOA: 06/22/2017  PCP: [name], MD  Admit date: 06/22/2017 Discharge date: 06/26/2017  Time spent: 35* minutes  Recommendations for Outpatient Follow-up:  1. 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)   Diabetes mellitus with renal manifestations, controlled ([place])   Persistent atrial fibrillation (HCC)   GERD (gastroesophageal reflux disease)   Elevated troponin   Multiple open wounds of left lower extremity   CKD (chronic kidney disease), stage III   Chest pain   CHF exacerbation (HCC)   Acute respiratory failure (HCC)   Leukocytosis   Acute systolic heart failure ([place])   Discharge Condition: Stable  Diet recommendation: Heart healthy diet  Filed Weights   06/24/17 0349 06/25/17 0626 06/26/17 0526  Weight: 88.3 kg (194 lb 9.6 oz) 85.5 kg (188 lb 9.6 oz) 85.1 kg (187 lb 9.6 oz)    History of present illness:  83 y.o.malewith medical history significant of CKD3, A.fib, CAD, diastolic CHF, lower extremity balance osteomyelitis, DM 2, HTN. Patient admitted with acute on chronic CHF exacerbation.    Hospital Course:  1. Acute on chronic respiratory failure- secondary to acute diastolic CHF. Patient was started on  IV diuresis with Lasix 60 mg every 12 hours. Lasix has now been changed to 80 mg by mouth daily. Cardiology will follow as outpatient. 2. Persistent atrial fibrillation- CHA2DS2VASc score of 6. Patient was started on heparin per pharmacy as per cardiology recommendation,  heparin was later  discontinued by cardiology as patient is not a good candidate for anticoagulation.,  Rate is controlled with metoprolol. 3. Diabetes  mellitus- continue sliding scale insulin with NovoLog, Lantus 30 units subcutaneous daily.  blood glucose is stable 170, 103 4. CAD/ Elevated troponin- patient has elevated troponin, 0.22, 0.16, likely from demand ischemia. Patient is not a candidate for cardiac catheterization due to renal functions and DNR  status as per cardiology. Continue aspirin, statin, isosorbide. 5. Acute on CK D stage IV- patient's baseline creatinine is around 1.8 2.0, today creatinine is 1.86. Improved with IV diuresis. N   Procedures:  None   Consultations:  Cardiology  Nephrology   Discharge Exam: Vitals:   06/26/17 0526 06/26/17 0833  BP: (!) 132/50   Pulse: (!) 56 60  Resp: 18 18  Temp: 98 F (36.7 C)   SpO2: 100% 100%    General: Appears in no acute distress Cardiovascular: S1S2 RRR Respiratory: Clear bilaterally  Discharge Instructions   Discharge Instructions    Diet - low sodium heart healthy    Complete by:  As directed    Increase activity slowly    Complete by:  As directed      Current Discharge Medication List    START taking these medications   Details  aspirin EC 81 MG EC tablet Take 1 tablet (81 mg total) by mouth daily.    furosemide (LASIX) 80 MG tablet Take 1 tablet (80 mg total) by mouth daily. Qty: 30 tablet, Refills: 2    potassium chloride SA (K-DUR,KLOR-CON) 20 MEQ tablet Take 1 tablet (20 mEq total) by mouth 2 (two) times daily.   Associated Diagnoses: Persistent atrial fibrillation ([place]); Chronic combined systolic and diastolic CHF (congestive heart failure) ([place]);  Coronary artery disease involving native coronary artery of native heart without angina pectoris; Acute renal failure superimposed on chronic kidney disease, unspecified CKD stage, unspecified acute renal failure type ([place])      CONTINUE these medications which have NOT CHANGED   Details  acetaminophen (TYLENOL) 325 MG tablet Take 650 mg by mouth every 6 (six) hours as needed for mild pain.     Amino Acids-Protein Hydrolys (FEEDING SUPPLEMENT, PRO-STAT SUGAR FREE 64,) LIQD Take 30 mLs by mouth 3 (three) times daily.    amLODipine (NORVASC) 5 MG tablet Take 5 mg by mouth daily.    bisacodyl (DULCOLAX) 10 MG suppository Place 10 mg rectally once as needed (FOR CONSTIPATION NOT RELIEVED BY MILK OF MAGNESIA).    cholecalciferol (VITAMIN D) 1000 units tablet Take 1,000 Units by mouth daily.    docusate sodium (COLACE) 100 MG capsule Take 100 mg by mouth daily. HOLD FOR LOOSE STOOLS    fluticasone (FLONASE) 50 MCG/ACT nasal spray Place 1 spray into both nostrils 2 (two) times daily.     hydrALAZINE (APRESOLINE) 10 MG tablet Take 10 mg by mouth 2 (two) times daily.    insulin aspart (NOVOLOG) 100 UNIT/ML injection Inject 5-12 Units into the skin See admin instructions. "THREE TIMES A DAY BEFORE EACH MEAL AND AT BEDTIME PER SLIDING SCALE: BGL 150-299 = 5 units; 300-399 = 8 units; greater than 400 = 12 units"    Insulin Glargine (LANTUS SOLOSTAR) 100 UNIT/ML Solostar Pen Inject 30 Units into the skin at bedtime. Qty: 15 mL, Refills: 0    ipratropium-albuterol (DUONEB) 0.5-2.5 (3) MG/3ML SOLN Take 3 mLs by nebulization every 6 (six) hours as needed for wheezing or shortness of breath.    isosorbide mononitrate (IMDUR) 30 MG 24 hr tablet Take 30 mg by mouth daily.     latanoprost (XALATAN) 0.005 % ophthalmic solution Place 1 drop into both eyes every evening.     loratadine (CLARITIN) 10 MG tablet Take 10 mg by mouth daily.     magnesium hydroxide (MILK OF MAGNESIA) 400 MG/5ML suspension Take 30 mLs by mouth once as needed (IF NO BOWEL MOVEMENT IN 3 DAYS).    metoprolol (LOPRESSOR) 50 MG tablet TAKE 1 TABLET (50 MG TOTAL) BY MOUTH 2 (TWO) TIMES DAILY. Qty: 60 tablet, Refills: 5    Multiple Vitamin (MULTIVITAMIN) tablet Take 1 tablet by mouth daily.    nitroGLYCERIN (NITROSTAT) 0.4 MG SL tablet Place 0.4 mg under the tongue every 5 (five) minutes x 3 doses as needed for chest pain.      pantoprazole (PROTONIX) 40 MG tablet Take 40 mg by mouth 2 (two) times daily.    rosuvastatin (CRESTOR) 5 MG tablet Take 1 tablet (5 mg total) by mouth 3 (three) times a week. Take 1 tablet Mon, Wed, Fri    sennosides-docusate sodium (SENOKOT-S) 8.6-50 MG tablet Take 2 tablets by mouth at bedtime.    Sodium Phosphates (RA SALINE ENEMA) 19-7 GM/118ML ENEM Place 1 enema rectally once as needed (for constipation not relieved by a Dulcolax suppository and contact MD if constipation is not relieved by the enema).    tamsulosin (FLOMAX) 0.4 MG CAPS capsule Take 0.4 mg by mouth at bedtime.     vitamin B-12 (CYANOCOBALAMIN) 1000 MCG tablet Take 1,000 mcg by mouth daily.     vitamin C (ASCORBIC ACID) 500 MG tablet Take 500 mg by mouth 2 (two) times daily.        No Known Allergies Follow-up Information  Belva Crome, MD Follow up in 1 week(s).   Specialty:  Cardiology Why:  we will arrange and contact you. Contact information: [address] [place] [address] [phone]            The results of significant diagnostics from this hospitalization (including imaging, microbiology, ancillary and laboratory) are listed below for reference.    Significant Diagnostic Studies: Dg Chest 2 View  Result Date: 06/23/2017 CLINICAL DATA:  Sob  Weak  Post  Lung  scan EXAM: CHEST  2 VIEW COMPARISON:  06/22/2017 FINDINGS: Status post median sternotomy and CABG. The heart is mildly enlarged. There are no focal consolidations. Small bilateral pleural effusions are present. There is perihilar peribronchial thickening. IMPRESSION: 1. Stable cardiomegaly. 2. Bronchitic changes -stable. 3. Small bilateral effusions. Electronically Signed   By: Nolon Nations M.D.   On: 06/23/2017 13:49   Dg Chest 2 View  Result Date: 06/11/2017 CLINICAL DATA:  Cough. EXAM: CHEST  2 VIEW COMPARISON:  Radiograph of October 20, 2016. FINDINGS: Stable cardiomegaly. Status post coronary artery  bypass graft. No pneumothorax or pleural effusion is noted. No acute pulmonary disease is noted. Bony thorax is unremarkable. Atherosclerosis of thoracic aorta is noted. IMPRESSION: No active cardiopulmonary disease.  Aortic atherosclerosis. Electronically Signed   By: Marijo Conception, M.D.   On: 06/11/2017 20:00   US Renal  Result Date: 06/22/2017 CLINICAL DATA:  Acute renal insufficiency EXAM: RENAL / URINARY TRACT ULTRASOUND COMPLETE COMPARISON:  CT abdomen and pelvis June 20, 2008 ; abdominal ultrasound December 31, 2009 FINDINGS: Right Kidney: Length: 11.6 cm. Echogenicity is mildly increased. There is renal cortical thinning. No perinephric fluid or hydronephrosis visualized. There is a cyst in the upper pole right kidney measuring 1.4 x 1.4 x 1.8 cm. No sonographically demonstrable calculus or ureterectasis. Left Kidney: Length: 10.6 cm. Echogenicity within normal limits. There is renal cortical thinning. No perinephric fluid or hydronephrosis visualized. There is a cyst in the mid left kidney measuring 1.8 x 1.8 x 2.4 cm. No sonographically demonstrable calculus or ureterectasis. Bladder: Appears normal for degree of bladder distention. IMPRESSION: Renal cortical thinning bilaterally, a finding that may be a function of age but also may be seen with medical renal disease. Mild increased echogenicity of the right kidney, a finding that may be seen with medical renal disease. No obstructing focus in either kidney. Small cyst in each kidney noted. Electronically Signed   By: Lowella Grip III M.D.   On: 06/22/2017 20:41   Nm Pulmonary Perf And Vent  Result Date: 06/23/2017 CLINICAL DATA:  Shortness of breath and elevated D-dimer. EXAM: NUCLEAR MEDICINE VENTILATION - PERFUSION LUNG SCAN TECHNIQUE: Ventilation images were obtained in multiple projections using inhaled aerosol Tc-11m DTPA. Perfusion images were obtained in multiple projections after intravenous injection of Tc-37m MAA.  RADIOPHARMACEUTICALS:  30 mCi Technetium-37m DTPA aerosol inhalation and 4.0 mCi Technetium-44m MAA IV COMPARISON:  Chest x-ray yesterday and earlier today. FINDINGS: Ventilation: No significant bili tori defects are identified. Perfusion: Perfusion imaging is unremarkable without evidence of focal perfusion defect. IMPRESSION: Normal ventilation and perfusion scintigraphy demonstrating no evidence of pulmonary embolism. Electronically Signed   By: Aletta Edouard M.D.   On: 06/23/2017 17:51   Dg Chest Port 1 View  Result Date: 06/22/2017 CLINICAL DATA:  Shortness of Breath EXAM: PORTABLE CHEST 1 VIEW COMPARISON:  June 11, 2017 FINDINGS: There is scarring in the left lower lobe region. There is no edema or consolidation. There is stable  cardiomegaly. Patient is status post coronary artery bypass grafting. There is aortic atherosclerosis. No adenopathy. No evident bone lesions. IMPRESSION: Left lower lobe scarring. No edema or consolidation. Stable cardiomegaly and postoperative change. There is aortic atherosclerosis. Aortic Atherosclerosis (ICD10-I70.0). Electronically Signed   By: Lowella Grip III M.D.   On: 06/22/2017 15:54    Microbiology: No results found for this or any previous visit (from the past 240 hour(s)).   Labs: Basic Metabolic Panel:  Recent Labs Lab 06/23/17 0108 06/23/17 0614 06/23/17 1441 06/24/17 0255 06/25/17 0315 06/26/17 0416  NA 137  --  138 136 138 139  K 3.8  --  4.0 3.7 3.6 3.8  CL 108  --  106 107 105 104  CO2 20*  --  21* 22 24 24   GLUCOSE 150*  --  195* 152* 139* 105*  BUN 45*  --  41* 38* 30* 29*  CREATININE 2.14*  --  2.11* 2.06* 1.87* 1.86*  CALCIUM 8.8*  --  8.6* 8.3* 8.7* 8.8*  MG  --  1.9 1.8  --   --   --   PHOS  --  4.0  --   --  3.4 3.8   Liver Function Tests:  Recent Labs Lab 06/22/17 1608 06/23/17 0108 06/23/17 1441 06/25/17 0315 06/26/17 0416  AST 27 23 22   --   --   ALT 26 23 25   --   --   ALKPHOS 70 65 72  --   --   BILITOT  1.4* 1.5* 1.3*  --   --   PROT 7.0 6.4* 6.6  --   --   ALBUMIN 3.2* 3.0* 2.9* 3.0* 2.9*    Recent Labs Lab 06/22/17 1608  LIPASE 21   No results for input(s): AMMONIA in the last 168 hours. CBC:  Recent Labs Lab 06/23/17 0614 06/23/17 1441 06/24/17 0255 06/25/17 0315 06/26/17 0416  WBC 7.6 9.5 6.5 6.0 6.9  NEUTROABS  --  7.3  --   --   --   HGB 8.8* 9.4* 8.4* 9.8* 9.6*  HCT 26.8* 28.1* 25.3* 29.3* 29.1*  MCV 87.6 87.5 86.6 86.2 86.4  PLT 168 177 187 217 237   Cardiac Enzymes:  Recent Labs Lab 06/23/17 0108 06/23/17 0614 06/23/17 1441 06/23/17 1927 06/24/17 0103  TROPONINI 0.25* 0.22* 0.16* 0.13* 0.14*   BNP: BNP (last 3 results)  Recent Labs  06/11/17 1907 06/23/17 0108 06/23/17 1441  BNP 452.4* 1,149.9* 1,038.2*    ProBNP (last 3 results) No results for input(s): PROBNP in the last 8760 hours.  CBG:  Recent Labs Lab 06/25/17 1131 06/25/17 1548 06/25/17 2104 06/26/17 0803 06/26/17 1122  GLUCAP 170* 206* 182* 63* 101*       Signed:  Eleonore Chiquito S MD.  [facility] 06/26/2017, 11:45 AM

## 2017-06-26 NOTE — Progress Notes (Signed)
Report called to Lindsay at [facility] 

## 2017-06-28 ENCOUNTER — Telehealth: Payer: Self-pay

## 2017-06-28 ENCOUNTER — Encounter: Payer: Self-pay | Admitting: Adult Health

## 2017-06-28 ENCOUNTER — Non-Acute Institutional Stay (SKILLED_NURSING_FACILITY): Payer: Medicare HMO | Admitting: Adult Health

## 2017-06-28 DIAGNOSIS — J309 Allergic rhinitis, unspecified: Secondary | ICD-10-CM | POA: Diagnosis not present

## 2017-06-28 DIAGNOSIS — I5032 Chronic diastolic (congestive) heart failure: Secondary | ICD-10-CM

## 2017-06-28 DIAGNOSIS — D638 Anemia in other chronic diseases classified elsewhere: Secondary | ICD-10-CM

## 2017-06-28 DIAGNOSIS — I25810 Atherosclerosis of coronary artery bypass graft(s) without angina pectoris: Secondary | ICD-10-CM | POA: Diagnosis not present

## 2017-06-28 DIAGNOSIS — I481 Persistent atrial fibrillation: Secondary | ICD-10-CM | POA: Diagnosis not present

## 2017-06-28 DIAGNOSIS — Z794 Long term (current) use of insulin: Secondary | ICD-10-CM

## 2017-06-28 DIAGNOSIS — J3489 Other specified disorders of nose and nasal sinuses: Secondary | ICD-10-CM | POA: Diagnosis not present

## 2017-06-28 DIAGNOSIS — E0821 Diabetes mellitus due to underlying condition with diabetic nephropathy: Secondary | ICD-10-CM

## 2017-06-28 DIAGNOSIS — I10 Essential (primary) hypertension: Secondary | ICD-10-CM

## 2017-06-28 DIAGNOSIS — E785 Hyperlipidemia, unspecified: Secondary | ICD-10-CM

## 2017-06-28 DIAGNOSIS — N179 Acute kidney failure, unspecified: Secondary | ICD-10-CM

## 2017-06-28 DIAGNOSIS — M6281 Muscle weakness (generalized): Secondary | ICD-10-CM | POA: Diagnosis not present

## 2017-06-28 DIAGNOSIS — N189 Chronic kidney disease, unspecified: Secondary | ICD-10-CM

## 2017-06-28 DIAGNOSIS — N400 Enlarged prostate without lower urinary tract symptoms: Secondary | ICD-10-CM | POA: Diagnosis not present

## 2017-06-28 DIAGNOSIS — R5381 Other malaise: Secondary | ICD-10-CM

## 2017-06-28 DIAGNOSIS — E538 Deficiency of other specified B group vitamins: Secondary | ICD-10-CM

## 2017-06-28 DIAGNOSIS — I130 Hypertensive heart and chronic kidney disease with heart failure and stage 1 through stage 4 chronic kidney disease, or unspecified chronic kidney disease: Secondary | ICD-10-CM | POA: Diagnosis not present

## 2017-06-28 DIAGNOSIS — I4819 Other persistent atrial fibrillation: Secondary | ICD-10-CM

## 2017-06-28 DIAGNOSIS — H409 Unspecified glaucoma: Secondary | ICD-10-CM | POA: Diagnosis not present

## 2017-06-28 NOTE — Telephone Encounter (Signed)
This is a patient of Gove, who was admitted to [facility] after hospitalization. [facility] F/U is needed. Hospital discharge from [facility] on 06/26/2017.

## 2017-06-28 NOTE — Progress Notes (Signed)
DATE:  06/28/2017   MRN:  [id number]  BIRTHDAY: 1920/09/08  Facility:  Nursing Home Location:  [facility] Room Number: 696-V  LEVEL OF CARE:  SNF (31)  Contact Information    Name [place] Son [phone]  [phone]   [place] Daughter [phone]  [phone]       Code Status History    Date Active Date Inactive Code Status Order ID Comments User Context   06/23/2017  1:11 PM 06/26/2017  5:50 PM DNR [id number]  Burtis Junes, NP Inpatient   06/22/2017 11:46 PM 06/23/2017  1:11 PM DNR [id number]  Toy Baker, MD ED   06/11/2017  9:22 PM 06/13/2017  4:40 PM DNR [id number]  Ivor Costa, MD ED   10/20/2016  6:10 AM 10/21/2016  5:48 PM DNR [id number]  Rise Patience, MD Inpatient    Questions for Most Recent Historical Code Status (Order [id number])    Question Answer Comment   In the event of cardiac or respiratory ARREST Do not call a "code blue"    In the event of cardiac or respiratory ARREST Do not perform Intubation, CPR, defibrillation or ACLS    In the event of cardiac or respiratory ARREST Use medication by any route, position, wound care, and other measures to relive pain and suffering. May use oxygen, suction and manual treatment of airway obstruction as needed for comfort.       Chief Complaint  Patient presents with  . Hospitalization Follow-up    Follow up hospitalization    HISTORY OF PRESENT ILLNESS:  This is a 72-YO male seen for hospital follow-up.  He was readmitted to [place] on 06/26/2017 following an admission at [facility] 06/22/2017-06/26/2017 for acute on chronic CHF. He was started on IV diuresis and now on Lasix 80 mg tab PO daily. He was seen in the room today with nephew @ bedside. He complained of nasal dryness. He has a PMH of coronary artery disease S/P CABG, hypertension, diabetes mellitus and hyperlipidemia.    PAST MEDICAL HISTORY:  Past Medical  History:  Diagnosis Date  . Actinic keratitis   . CHF (congestive heart failure) ([place])   . Claudication ([place])   . Coronary artery disease    status post CABG, 1999  . Diabetes mellitus    Uncontrolled secondary to dietary noncompliance  . Diverticulosis   . DJD (degenerative joint disease)   . Hypertension    hypertensive syndrome with dyspnea -[facility], February, 2011 - EF 50% and cardiac bypass grafts all patent - responded to diuretics and blood pressure control - [name]  . Microscopic hematuria   . Onychomycosis   . Pneumonia 05/2017  . Prostate nodule   . PSVT (paroxysmal supraventricular tachycardia) ([place])   . Renal disorder   . Vitamin B12 deficiency   . Vitamin D deficiency      CURRENT MEDICATIONS: Reviewed  Patient's Medications  New Prescriptions   No medications on file  Previous Medications   ACETAMINOPHEN (TYLENOL) 325 MG TABLET    Take 650 mg by mouth every 6 (six) hours as needed for mild pain.   AMLODIPINE (NORVASC) 5 MG TABLET    Take 5 mg by mouth daily.   ASPIRIN EC 81 MG EC TABLET    Take 1 tablet (81 mg total) by mouth daily.   BISACODYL (DULCOLAX) 10 MG SUPPOSITORY    Place 10 mg rectally once as needed (  FOR CONSTIPATION NOT RELIEVED BY MILK OF MAGNESIA).   CHOLECALCIFEROL (VITAMIN D) 1000 UNITS TABLET    Take 1,000 Units by mouth daily.   DOCUSATE SODIUM (COLACE) 100 MG CAPSULE    Take 100 mg by mouth daily. HOLD FOR LOOSE STOOLS   FLUTICASONE (FLONASE) 50 MCG/ACT NASAL SPRAY    Place 1 spray into both nostrils 2 (two) times daily.    FUROSEMIDE (LASIX) 80 MG TABLET    Take 1 tablet (80 mg total) by mouth daily.   HYDRALAZINE (APRESOLINE) 10 MG TABLET    Take 10 mg by mouth 2 (two) times daily.   INSULIN ASPART (NOVOLOG) 100 UNIT/ML INJECTION    Inject 5-12 Units into the skin See admin instructions. "THREE TIMES A DAY BEFORE EACH MEAL AND AT BEDTIME PER SLIDING SCALE: BGL 150-299 = 5 units; 300-399 = 8 units; greater than 400  = 12 units"   INSULIN GLARGINE (LANTUS SOLOSTAR) 100 UNIT/ML SOLOSTAR PEN    Inject 30 Units into the skin at bedtime.   ISOSORBIDE MONONITRATE (IMDUR) 30 MG 24 HR TABLET    Take 30 mg by mouth daily.    LATANOPROST (XALATAN) 0.005 % OPHTHALMIC SOLUTION    Place 1 drop into both eyes every evening.    LORATADINE (CLARITIN) 10 MG TABLET    Take 10 mg by mouth daily.    MAGNESIUM HYDROXIDE (MILK OF MAGNESIA) 400 MG/5ML SUSPENSION    Take 30 mLs by mouth once as needed (IF NO BOWEL MOVEMENT IN 3 DAYS).   METOPROLOL (LOPRESSOR) 50 MG TABLET    TAKE 1 TABLET (50 MG TOTAL) BY MOUTH 2 (TWO) TIMES DAILY.   MULTIPLE VITAMIN (MULTIVITAMIN) TABLET    Take 1 tablet by mouth daily.   NITROGLYCERIN (NITROSTAT) 0.4 MG SL TABLET    Place 0.4 mg under the tongue every 5 (five) minutes x 3 doses as needed for chest pain.    PANTOPRAZOLE (PROTONIX) 40 MG TABLET    Take 40 mg by mouth 2 (two) times daily.   POTASSIUM CHLORIDE SA (K-DUR,KLOR-CON) 20 MEQ TABLET    Take 1 tablet (20 mEq total) by mouth 2 (two) times daily.   ROSUVASTATIN (CRESTOR) 5 MG TABLET    Take 1 tablet (5 mg total) by mouth 3 (three) times a week. Take 1 tablet Mon, Wed, Fri   SENNOSIDES-DOCUSATE SODIUM (SENOKOT-S) 8.6-50 MG TABLET    Take 2 tablets by mouth at bedtime.   SODIUM PHOSPHATES (RA SALINE ENEMA) 19-7 GM/118ML ENEM    Place 1 enema rectally once as needed (for constipation not relieved by a Dulcolax suppository and contact MD if constipation is not relieved by the enema).   TAMSULOSIN (FLOMAX) 0.4 MG CAPS CAPSULE    Take 0.4 mg by mouth at bedtime.    VITAMIN B-12 (CYANOCOBALAMIN) 1000 MCG TABLET    Take 1,000 mcg by mouth daily.    VITAMIN C (ASCORBIC ACID) 500 MG TABLET    Take 500 mg by mouth 2 (two) times daily.   Modified Medications   No medications on file  Discontinued Medications   AMINO ACIDS-PROTEIN HYDROLYS (FEEDING SUPPLEMENT, PRO-STAT SUGAR FREE 64,) LIQD    Take 30 mLs by mouth 3 (three) times daily.    IPRATROPIUM-ALBUTEROL (DUONEB) 0.5-2.5 (3) MG/3ML SOLN    Take 3 mLs by nebulization every 6 (six) hours as needed for wheezing or shortness of breath.     No Known Allergies   REVIEW OF SYSTEMS:  GENERAL: no change in appetite, no  fatigue, no fever, chills or weakness NOSE: complains of nasal dryness MOUTH and THROAT: Denies oral discomfort, gingival pain or bleeding, pain from teeth or hoarseness   RESPIRATORY: no cough, SOB, DOE, wheezing, hemoptysis CARDIAC: no chest pain or palpitations GI: no abdominal pain, diarrhea, constipation, heart burn, nausea or vomiting GU: Denies dysuria, frequency, hematuria, incontinence, or discharge PSYCHIATRIC: Denies feeling of depression or anxiety. No report of hallucinations, insomnia, paranoia, or agitation     PHYSICAL EXAMINATION  GENERAL APPEARANCE: Well nourished. In no acute distress.  SKIN:  Skin is warm and dry.  HEAD: Normal in size and contour. No evidence of trauma EYES: Lids open and close normally. No blepharitis, entropion or ectropion.  EARS: Pinnae are normal. Patient hears normal voice tunes of the examiner MOUTH and THROAT: Lips are without lesions. Oral mucosa is moist and without lesions. Tongue is normal in shape, size, and color and without lesions RESPIRATORY: breathing is even & unlabored, BS CTAB CARDIAC: RRR, no murmur,no extra heart sounds, BLE 2+ edema GI: abdomen soft, normal BS, no masses, no tenderness, no hepatomegaly, no splenomegaly EXTREMITIES:  Able to move X 4 extremities PSYCHIATRIC: Alert and oriented X 3. Affect and behavior are appropriate    LABS/RADIOLOGY: Labs reviewed: Basic Metabolic Panel:  Recent Labs  06/11/17 1907  06/23/17 0614 06/23/17 1441 06/24/17 0255 06/25/17 0315 06/26/17 0416  NA 127*  < >  --  138 136 138 139  K 5.0  < >  --  4.0 3.7 3.6 3.8  CL 93*  < >  --  106 107 105 104  CO2 22  < >  --  21* 22 24 24   GLUCOSE 275*  < >  --  195* 152* 139* 105*  BUN 127*  < >   --  41* 38* 30* 29*  CREATININE 4.14*  < >  --  2.11* 2.06* 1.87* 1.86*  CALCIUM 8.9  < >  --  8.6* 8.3* 8.7* 8.8*  MG 1.7  --  1.9 1.8  --   --   --   PHOS  --   --  4.0  --   --  3.4 3.8  < > = values in this interval not displayed. Liver Function Tests:  Recent Labs  06/22/17 1608 06/23/17 0108 06/23/17 1441 06/25/17 0315 06/26/17 0416  AST 27 23 22   --   --   ALT 26 23 25   --   --   ALKPHOS 70 65 72  --   --   BILITOT 1.4* 1.5* 1.3*  --   --   PROT 7.0 6.4* 6.6  --   --   ALBUMIN 3.2* 3.0* 2.9* 3.0* 2.9*    Recent Labs  06/22/17 1608  LIPASE 21   CBC:  Recent Labs  10/20/16 0717  06/11/17 1907  06/23/17 1441 06/24/17 0255 06/25/17 0315 06/26/17 0416  WBC 13.9*  < > 8.9  < > 9.5 6.5 6.0 6.9  NEUTROABS 11.4*  --  6.3  --  7.3  --   --   --   HGB 9.9*  < > 11.4*  < > 9.4* 8.4* 9.8* 9.6*  HCT 30.6*  < > 33.7*  < > 28.1* 25.3* 29.3* 29.1*  MCV 87.4  < > 86.6  < > 87.5 86.6 86.2 86.4  PLT 181  < > 183  < > 177 187 217 237  < > = values in this interval not displayed. Lipid Panel:  Recent Labs  04/17/17 06/19/17  HDL 51 49   Cardiac Enzymes:  Recent Labs  06/23/17 1441 06/23/17 1927 06/24/17 0103  TROPONINI 0.16* 0.13* 0.14*   CBG:  Recent Labs  06/25/17 2104 06/26/17 0803 06/26/17 1122  GLUCAP 182* 63* 101*      Dg Chest 2 View  Result Date: 06/23/2017 CLINICAL DATA:  Sob  Weak  Post  Lung  scan EXAM: CHEST  2 VIEW COMPARISON:  06/22/2017 FINDINGS: Status post median sternotomy and CABG. The heart is mildly enlarged. There are no focal consolidations. Small bilateral pleural effusions are present. There is perihilar peribronchial thickening. IMPRESSION: 1. Stable cardiomegaly. 2. Bronchitic changes -stable. 3. Small bilateral effusions. Electronically Signed   By: Nolon Nations M.D.   On: 06/23/2017 13:49   Dg Chest 2 View  Result Date: 06/11/2017 CLINICAL DATA:  Cough. EXAM: CHEST  2 VIEW COMPARISON:  Radiograph of October 20, 2016.  FINDINGS: Stable cardiomegaly. Status post coronary artery bypass graft. No pneumothorax or pleural effusion is noted. No acute pulmonary disease is noted. Bony thorax is unremarkable. Atherosclerosis of thoracic aorta is noted. IMPRESSION: No active cardiopulmonary disease.  Aortic atherosclerosis. Electronically Signed   By: Marijo Conception, M.D.   On: 06/11/2017 20:00   US Renal  Result Date: 06/22/2017 CLINICAL DATA:  Acute renal insufficiency EXAM: RENAL / URINARY TRACT ULTRASOUND COMPLETE COMPARISON:  CT abdomen and pelvis June 20, 2008 ; abdominal ultrasound December 31, 2009 FINDINGS: Right Kidney: Length: 11.6 cm. Echogenicity is mildly increased. There is renal cortical thinning. No perinephric fluid or hydronephrosis visualized. There is a cyst in the upper pole right kidney measuring 1.4 x 1.4 x 1.8 cm. No sonographically demonstrable calculus or ureterectasis. Left Kidney: Length: 10.6 cm. Echogenicity within normal limits. There is renal cortical thinning. No perinephric fluid or hydronephrosis visualized. There is a cyst in the mid left kidney measuring 1.8 x 1.8 x 2.4 cm. No sonographically demonstrable calculus or ureterectasis. Bladder: Appears normal for degree of bladder distention. IMPRESSION: Renal cortical thinning bilaterally, a finding that may be a function of age but also may be seen with medical renal disease. Mild increased echogenicity of the right kidney, a finding that may be seen with medical renal disease. No obstructing focus in either kidney. Small cyst in each kidney noted. Electronically Signed   By: Lowella Grip III M.D.   On: 06/22/2017 20:41   Nm Pulmonary Perf And Vent  Result Date: 06/23/2017 CLINICAL DATA:  Shortness of breath and elevated D-dimer. EXAM: NUCLEAR MEDICINE VENTILATION - PERFUSION LUNG SCAN TECHNIQUE: Ventilation images were obtained in multiple projections using inhaled aerosol Tc-26m DTPA. Perfusion images were obtained in multiple projections  after intravenous injection of Tc-40m MAA. RADIOPHARMACEUTICALS:  30 mCi Technetium-40m DTPA aerosol inhalation and 4.0 mCi Technetium-75m MAA IV COMPARISON:  Chest x-ray yesterday and earlier today. FINDINGS: Ventilation: No significant bili tori defects are identified. Perfusion: Perfusion imaging is unremarkable without evidence of focal perfusion defect. IMPRESSION: Normal ventilation and perfusion scintigraphy demonstrating no evidence of pulmonary embolism. Electronically Signed   By: Aletta Edouard M.D.   On: 06/23/2017 17:51   Dg Chest Port 1 View  Result Date: 06/22/2017 CLINICAL DATA:  Shortness of Breath EXAM: PORTABLE CHEST 1 VIEW COMPARISON:  June 11, 2017 FINDINGS: There is scarring in the left lower lobe region. There is no edema or consolidation. There is stable cardiomegaly. Patient is status post coronary artery bypass grafting. There is aortic atherosclerosis. No adenopathy. No evident bone lesions. IMPRESSION: Left lower  lobe scarring. No edema or consolidation. Stable cardiomegaly and postoperative change. There is aortic atherosclerosis. Aortic Atherosclerosis (ICD10-I70.0). Electronically Signed   By: Lowella Grip III M.D.   On: 06/22/2017 15:54    ASSESSMENT/PLAN:  1. Physical deconditioning - for rehabilitation with PT and OT, for therapeutic strengthening exercises; fall precautions   2. Chronic diastolic CHF (congestive heart failure) (HCC) - continue Lasix 80 mg 1 tab daily, metoprolol tartrate 50 mg 1 tab twice a day, hydralazine 10 mg 1 tab twice a day, and K-tab ER 20 meq 1 tab BID,  for BMP in 1 week, weigh daily and notify provider for weight gain of >= 3lbs, follow-up with cardiology, Dr. Tamala Julian, in 1 week   3. Coronary artery disease involving coronary bypass graft of native heart, angina presence unspecified - no  complaints of chest pain, continue NTG when necessary, isosorbide MN ER 30 mg 1 tab daily, aspirin 81 mg daily   4. Allergic rhinitis, unspecified  seasonality, unspecified trigger - continue loratadine 10 mg daily and change fluticasone nasal spray to when necessary   5. Nasal dryness - start saline nasal spray 1 spray into each nostril twice a day   6. Essential hypertension - well controlled; continue metoprolol tartrate 50 mg 1 tab twice a day, hydralazine 10 mg 1 tab twice a day and amlodipine 5 mg 1 tab daily   7. Vitamin B 12 deficiency - continue vitamin B12 1000 g 1 tab daily   8. Diabetes mellitus due to underlying condition, controlled, with diabetic nephropathy, with long-term current use of insulin (HCC) - continue NovoLog 100 units/mL SSI subcutaneous before meals at bedtime, Lantus 100 units/mL inject 30 units subcutaneous daily at bedtime Lab Results  Component Value Date   HGBA1C 9.5 (H) 06/23/2017     9. Glaucoma of both eyes, unspecified glaucoma type - no complaints of eye pain, continue latanoprost eyedrops daily evening   10. Dyslipidemia - continue rosuvastatin 5 mg 1 tab 3 times a week (MWF) Lab Results  Component Value Date   CHOL 181 06/19/2017   HDL 49 06/19/2017   LDLCALC 117 06/19/2017   TRIG 75 06/19/2017   CHOLHDL 2.5 01/01/2010     11. Benign prostatic hyperplasia, unspecified whether lower urinary tract symptoms present - Continue tamsulosin 0.4 mg 1 capsule daily at bedtime  12. Persistent atrial fibrillation (HCC) - rate controlled; continue metoprolol tartrate 50 mg 1 tab twice a day  13. Acute on chronic CKD -  for BMP in 1 week Lab Results  Component Value Date   CREATININE 1.86 (H) 06/26/2017    14. Anemia of chronic disease - for repeat CBC in 1 week Lab Results  Component Value Date   HGB 9.6 (L) 06/26/2017       Goals of care:  Long-term care   Kwesi Sangha C. [place] - NP    [employer] [phone]

## 2017-06-30 ENCOUNTER — Non-Acute Institutional Stay (SKILLED_NURSING_FACILITY): Payer: Medicare HMO | Admitting: Internal Medicine

## 2017-06-30 ENCOUNTER — Encounter: Payer: Self-pay | Admitting: Internal Medicine

## 2017-06-30 DIAGNOSIS — I5042 Chronic combined systolic (congestive) and diastolic (congestive) heart failure: Secondary | ICD-10-CM

## 2017-06-30 DIAGNOSIS — N183 Chronic kidney disease, stage 3 unspecified: Secondary | ICD-10-CM

## 2017-06-30 DIAGNOSIS — I481 Persistent atrial fibrillation: Secondary | ICD-10-CM

## 2017-06-30 DIAGNOSIS — I70209 Unspecified atherosclerosis of native arteries of extremities, unspecified extremity: Secondary | ICD-10-CM | POA: Diagnosis not present

## 2017-06-30 DIAGNOSIS — E1151 Type 2 diabetes mellitus with diabetic peripheral angiopathy without gangrene: Secondary | ICD-10-CM | POA: Diagnosis not present

## 2017-06-30 DIAGNOSIS — I4819 Other persistent atrial fibrillation: Secondary | ICD-10-CM
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
## 2017-07-01 DIAGNOSIS — I10 Essential (primary) hypertension: Secondary | ICD-10-CM | POA: Diagnosis not present

## 2017-07-01 DIAGNOSIS — D649 Anemia, unspecified: Secondary | ICD-10-CM | POA: Diagnosis not present

## 2017-07-01 DIAGNOSIS — Z130 Encounter for screening for diseases of the blood and blood-forming organs and certain disorders involving the immune mechanism: Secondary | ICD-10-CM | POA: Diagnosis not present

## 2017-07-01 LAB — CBC AND DIFFERENTIAL
HCT: 32 — AB (ref 41–53)
Hemoglobin: 10.7 — AB (ref 13.5–17.5)
Neutrophils Absolute: 6
PLATELETS: 262 (ref 150–399)
WBC: 8.6

## 2017-07-01 LAB — BASIC METABOLIC PANEL
BUN: 47 — AB (ref 4–21)
CREATININE: 2.1 — AB (ref 0.6–1.3)
Glucose: 129
Potassium: 4.5 (ref 3.4–5.3)
Sodium: 137 (ref 137–147)

## 2017-07-01 LAB — HEPATIC FUNCTION PANEL
ALT: 19 (ref 10–40)
AST: 13 — AB (ref 14–40)
Alkaline Phosphatase: 67 (ref 25–125)
BILIRUBIN, TOTAL: 0.3
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
## 2017-07-07 ENCOUNTER — Telehealth: Payer: Self-pay | Admitting: *Deleted

## 2017-07-07 ENCOUNTER — Encounter (INDEPENDENT_AMBULATORY_CARE_PROVIDER_SITE_OTHER): Payer: Self-pay

## 2017-07-07 ENCOUNTER — Encounter: Payer: Self-pay | Admitting: Nurse Practitioner

## 2017-07-07 ENCOUNTER — Ambulatory Visit (INDEPENDENT_AMBULATORY_CARE_PROVIDER_SITE_OTHER): Payer: Medicare HMO | Admitting: Nurse Practitioner

## 2017-07-07 VITALS — BP 138/58 | HR 60 | Ht 68.0 in | Wt 188.0 lb

## 2017-07-07 DIAGNOSIS — I2510 Atherosclerotic heart disease of native coronary artery without angina pectoris: Secondary | ICD-10-CM | POA: Diagnosis not present

## 2017-07-07 DIAGNOSIS — I5032 Chronic diastolic (congestive) heart failure: Secondary | ICD-10-CM

## 2017-07-07 DIAGNOSIS — I481 Persistent atrial fibrillation: Secondary | ICD-10-CM

## 2017-07-07 DIAGNOSIS — I4819 Other persistent atrial fibrillation: Secondary | ICD-10-CM

## 2017-07-07 DIAGNOSIS — I5042 Chronic combined systolic (congestive) and diastolic (congestive) heart failure: Secondary | ICD-10-CM

## 2017-07-07 NOTE — Patient Instructions (Addendum)
We will be checking the following labs today - NONE   Medication Instructions:    Continue with your current medicines.     Testing/Procedures To Be Arranged:  N/A  Follow-Up:   See Dr. Tamala Julian in October - we will get that scheduled today.     Other Special Instructions:   N/A    If you need a refill on your cardiac medications before your next appointment, please call your pharmacy.   Call the [place] office at [phone] if you have any questions, problems or concerns.

## 2017-07-07 NOTE — Telephone Encounter (Signed)
S/w Geni Bers at [facility] to get fax # [phone] phone is # [phone]. Will fax note from [place] ov today to [place].

## 2017-07-09 ENCOUNTER — Ambulatory Visit: Payer: Medicare HMO | Admitting: Cardiology

## 2017-07-16 DIAGNOSIS — N179 Acute kidney failure, unspecified: Secondary | ICD-10-CM | POA: Diagnosis not present

## 2017-07-16 DIAGNOSIS — I129 Hypertensive chronic kidney disease with stage 1 through stage 4 chronic kidney disease, or unspecified chronic kidney disease: Secondary | ICD-10-CM | POA: Diagnosis not present

## 2017-07-16 DIAGNOSIS — E1129 Type 2 diabetes mellitus with other diabetic kidney complication: Secondary | ICD-10-CM | POA: Diagnosis not present

## 2017-07-16 DIAGNOSIS — N184 Chronic kidney disease, stage 4 (severe): Secondary | ICD-10-CM | POA: Diagnosis not present

## 2017-07-20 ENCOUNTER — Non-Acute Institutional Stay (SKILLED_NURSING_FACILITY): Payer: Medicare HMO

## 2017-07-20 DIAGNOSIS — Z0000 Encounter for general adult medical examination without abnormal findings: Secondary | ICD-10-CM | POA: Diagnosis not present

## 2017-07-20 NOTE — Progress Notes (Signed)
Subjective:   Cole [name] is a 83 y.o. male who presents for Medicare Annual/Subsequent preventive examination at [facility]  Last AWV-12/11/15    Objective:    Vitals: BP (!) 152/60 (BP Location: Right Arm, Patient Position: Sitting)   Pulse 64   Temp 98.1 F (36.7 C) (Oral)   Ht 5\' 8"  (1.727 m)   Wt 188 lb (85.3 kg)   SpO2 96%   BMI 28.59 kg/m   Body mass index is 28.59 kg/m.  Tobacco History  Smoking Status  . Former Smoker  . Types: Cigars  Smokeless Tobacco  . Never Used    Comment: rarely smoked     Counseling given: Not Answered   Past Medical History:  Diagnosis Date  . Actinic keratitis   . CHF (congestive heart failure) ([place])   . Claudication ([place])   . Coronary artery disease    status post CABG, 1999  . Diabetes mellitus    Uncontrolled secondary to dietary noncompliance  . Diverticulosis   . DJD (degenerative joint disease)   . Hypertension    hypertensive syndrome with dyspnea -[facility], February, 2011 - EF 50% and cardiac bypass grafts all patent - responded to diuretics and blood pressure control - [name]  . Microscopic hematuria   . Onychomycosis   . Pneumonia 05/2017  . Prostate nodule   . PSVT (paroxysmal supraventricular tachycardia) ([place])   . Renal disorder   . Vitamin B12 deficiency   . Vitamin D deficiency    Past Surgical History:  Procedure Laterality Date  . APPENDECTOMY    . CORONARY ARTERY BYPASS GRAFT     Family History  Problem Relation Age of Onset  . Asthma Neg Hx   . COPD Neg Hx    History  Sexual Activity  . Sexual activity: No    Outpatient Encounter Prescriptions as of 07/20/2017  Medication Sig  . acetaminophen (TYLENOL) 325 MG tablet Take 650 mg by mouth every 6 (six) hours as needed for mild pain.  Marland Kitchen amLODipine (NORVASC) 5 MG tablet Take 5 mg by mouth daily.  Marland Kitchen aspirin EC 81 MG EC tablet Take 1 tablet (81 mg total) by mouth daily.  . cholecalciferol (VITAMIN D)  1000 units tablet Take 1,000 Units by mouth daily.  Marland Kitchen docusate sodium (COLACE) 100 MG capsule Take 100 mg by mouth daily. HOLD FOR LOOSE STOOLS  . fluticasone (FLONASE) 50 MCG/ACT nasal spray Place 1 spray into both nostrils 2 (two) times daily as needed.   . furosemide (LASIX) 80 MG tablet Take 1 tablet (80 mg total) by mouth daily.  . hydrALAZINE (APRESOLINE) 10 MG tablet Take 10 mg by mouth 2 (two) times daily.  . insulin aspart (NOVOLOG) 100 UNIT/ML injection Inject 5-12 Units into the skin See admin instructions. "THREE TIMES A DAY BEFORE EACH MEAL AND AT BEDTIME PER SLIDING SCALE: BGL 150-299 = 5 units; 300-399 = 8 units; greater than 400 = 12 units"  . Insulin Glargine (LANTUS SOLOSTAR) 100 UNIT/ML Solostar Pen Inject 30 Units into the skin at bedtime.  Marland Kitchen ipratropium-albuterol (DUONEB) 0.5-2.5 (3) MG/3ML SOLN Take 3 mLs by nebulization every 6 (six) hours as needed.  . isosorbide mononitrate (IMDUR) 30 MG 24 hr tablet Take 30 mg by mouth daily.   Marland Kitchen latanoprost (XALATAN) 0.005 % ophthalmic solution Place 1 drop into both eyes every evening.   . loratadine (CLARITIN) 10 MG tablet Take 10 mg by mouth at bedtime.   Marland Kitchen  metoprolol (LOPRESSOR) 50 MG tablet TAKE 1 TABLET (50 MG TOTAL) BY MOUTH 2 (TWO) TIMES DAILY.  . Multiple Vitamin (MULTIVITAMIN) tablet Take 1 tablet by mouth daily.  . nitroGLYCERIN (NITROSTAT) 0.4 MG SL tablet Place 0.4 mg under the tongue every 5 (five) minutes x 3 doses as needed for chest pain.   Marland Kitchen oxymetazoline (AFRIN) 0.05 % nasal spray Place 1 spray into both nostrils 2 (two) times daily.  . pantoprazole (PROTONIX) 40 MG tablet Take 40 mg by mouth 2 (two) times daily.  . potassium chloride SA (K-DUR,KLOR-CON) 20 MEQ tablet Take 1 tablet (20 mEq total) by mouth 2 (two) times daily.  . rosuvastatin (CRESTOR) 5 MG tablet Take 1 tablet (5 mg total) by mouth 3 (three) times a week. Take 1 tablet Mon, Wed, Fri  . sennosides-docusate sodium (SENOKOT-S) 8.6-50 MG tablet Take 2  tablets by mouth at bedtime.  . tamsulosin (FLOMAX) 0.4 MG CAPS capsule Take 0.4 mg by mouth at bedtime.   . vitamin B-12 (CYANOCOBALAMIN) 1000 MCG tablet Take 1,000 mcg by mouth daily.   . bisacodyl (DULCOLAX) 10 MG suppository Place 10 mg rectally once as needed (FOR CONSTIPATION NOT RELIEVED BY MILK OF MAGNESIA).   No facility-administered encounter medications on file as of 07/20/2017.     Activities of Daily Living In your present state of health, do you have any difficulty performing the following activities: 07/20/2017 06/23/2017  Hearing? N Y  Vision? Y N  Difficulty concentrating or making decisions? Y Y  Comment - sometimes  Walking or climbing stairs? Y Y  Comment - -  Dressing or bathing? N Y  Comment - needs assistance  Doing errands, shopping? Y Y  Comment - family takes care of  Preparing Food and eating ? Y -  Using the Toilet? Y -  In the past six months, have you accidently leaked urine? N -  Do you have problems with loss of bowel control? N -  Managing your Medications? Y -  Managing your Finances? Y -  Housekeeping or managing your Housekeeping? Y -  Some recent data might be hidden    Patient Care Team: [name], MD as PCP - General (Internal Medicine) Medina-Vargas, Senaida Lange, NP as Nurse Practitioner (Internal Medicine)   Assessment:     Exercise Activities and Dietary recommendations Current Exercise Habits: The patient does not participate in regular exercise at present, Exercise limited by: orthopedic condition(s)  Goals    None     Fall Risk Fall Risk  07/20/2017 01/05/2017 09/09/2016 08/24/2016 08/07/2016  Falls in the past year? No No No No No   Depression Screen PHQ 2/9 Scores 07/20/2017  PHQ - 2 Score 0    Cognitive Function     6CIT Screen 07/20/2017  What Year? 0 points  What month? 0 points  What time? 0 points  Count back from 20 0 points  Months in reverse 0 points  Repeat phrase 10 points  Total Score 10    Immunization  History  Administered Date(s) Administered  . Influenza-Unspecified 08/06/2015, 09/03/2016  . PPD Test 09/20/2015  . Pneumococcal Polysaccharide-23 08/06/2015  . Pneumococcal-Unspecified 09/03/2016  . Tdap 07/28/2015   Screening Tests Health Maintenance  Topic Date Due  . OPHTHALMOLOGY EXAM  09/28/1930  . URINE MICROALBUMIN  12/10/2016  . FOOT EXAM  03/06/2017  . INFLUENZA VACCINE  06/16/2017  . PNA vac Low Risk Adult (2 of 2 - PCV13) 09/03/2017  . HEMOGLOBIN A1C  12/24/2017  .  TETANUS/TDAP  07/27/2025      Plan:      Mr. Gintz , Thank you for taking time to come for your Medicare Wellness Visit. I appreciate your ongoing commitment to your health goals. Please review the following plan we discussed and let me know if I can assist you in the future.   These are the goals we discussed: Goals    None      This is a list of the screening recommended for you and due dates:  Health Maintenance  Topic Date Due  . Eye exam for diabetics  09[birth year]28/1930  . Urine Protein Check  12/10/2016  . Complete foot exam   03/06/2017  . Flu Shot  06/16/2017  . Pneumonia vaccines (2 of 2 - PCV13) 09/03/2017  . Hemoglobin A1C  12/24/2017  . Tetanus Vaccine  07/27/2025    Quick Notes   Health Maintenance: Pt will get flu vaccine when available in nursing home.     Abnormal Screen: 6 CIT-10     Patient Concerns: None     Nurse Concerns: None  I have personally reviewed the health advisor's clinical note, was available for consultation, and agree with the assessment and plan as written. [name] M.D., FACP, [facility]

## 2017-07-20 NOTE — Patient Instructions (Signed)
Cole [name] , Thank you for taking time to come for your Medicare Wellness Visit. I appreciate your ongoing commitment to your health goals. Please review the following plan we discussed and let me know if I can assist you in the future.   Screening recommendations/referrals: Colonoscopy excluded, pt over age 81 Recommended yearly ophthalmology/optometry visit for glaucoma screening and checkup Recommended yearly dental visit for hygiene and checkup  Vaccinations: Influenza vaccine due. Pneumococcal vaccine up to date Tdap vaccine up to date. Due 07/27/25 Shingles vaccine not in records  Advanced directives: Need a copy for chart  Conditions/risks identified: None  Next appointment: Dr. Linna Darner makes rounds  Preventive Care 4 Years and Older, Male Preventive care refers to lifestyle choices and visits with your health care provider that can promote health and wellness. What does preventive care include?  A yearly physical exam. This is also called an annual well check.  Dental exams once or twice a year.  Routine eye exams. Ask your health care provider how often you should have your eyes checked.  Personal lifestyle choices, including:  Daily care of your teeth and gums.  Regular physical activity.  Eating a healthy diet.  Avoiding tobacco and drug use.  Limiting alcohol use.  Practicing safe sex.  Taking low doses of aspirin every day.  Taking vitamin and mineral supplements as recommended by your health care provider. What happens during an annual well check? The services and screenings done by your health care provider during your annual well check will depend on your age, overall health, lifestyle risk factors, and family history of disease. Counseling  Your health care provider may ask you questions about your:  Alcohol use.  Tobacco use.  Drug use.  Emotional well-being.  Home and relationship well-being.  Sexual activity.  Eating  habits.  History of falls.  Memory and ability to understand (cognition).  Work and work Statistician. Screening  You may have the following tests or measurements:  Height, weight, and BMI.  Blood pressure.  Lipid and cholesterol levels. These may be checked every 5 years, or more frequently if you are over 34 years old.  Skin check.  Lung cancer screening. You may have this screening every year starting at age 41 if you have a 30-pack-year history of smoking and currently smoke or have quit within the past 15 years.  Fecal occult blood test (FOBT) of the stool. You may have this test every year starting at age 53.  Flexible sigmoidoscopy or colonoscopy. You may have a sigmoidoscopy every 5 years or a colonoscopy every 10 years starting at age 37.  Prostate cancer screening. Recommendations will vary depending on your family history and other risks.  Hepatitis C blood test.  Hepatitis B blood test.  Sexually transmitted disease (STD) testing.  Diabetes screening. This is done by checking your blood sugar (glucose) after you have not eaten for a while (fasting). You may have this done every 1-3 years.  Abdominal aortic aneurysm (AAA) screening. You may need this if you are a current or former smoker.  Osteoporosis. You may be screened starting at age 79 if you are at high risk. Talk with your health care provider about your test results, treatment options, and if necessary, the need for more tests. Vaccines  Your health care provider may recommend certain vaccines, such as:  Influenza vaccine. This is recommended every year.  Tetanus, diphtheria, and acellular pertussis (Tdap, Td) vaccine. You may need a Td booster every 10 years.  Zoster vaccine. You may need this after age 61.  Pneumococcal 13-valent conjugate (PCV13) vaccine. One dose is recommended after age 41.  Pneumococcal polysaccharide (PPSV23) vaccine. One dose is recommended after age 64. Talk to your health  care provider about which screenings and vaccines you need and how often you need them. This information is not intended to replace advice given to you by your health care provider. Make sure you discuss any questions you have with your health care provider. Document Released: 11/29/2015 Document Revised: 07/22/2016 Document Reviewed: 09/03/2015 Elsevier Interactive Patient Education  2017 [place] Prevention in the Home Falls can cause injuries. They can happen to people of all ages. There are many things you can do to make your home safe and to help prevent falls. What can I do on the outside of my home?  Regularly fix the edges of walkways and driveways and fix any cracks.  Remove anything that might make you trip as you walk through a door, such as a raised step or threshold.  Trim any bushes or trees on the path to your home.  Use bright outdoor lighting.  Clear any walking paths of anything that might make someone trip, such as rocks or tools.  Regularly check to see if handrails are loose or broken. Make sure that both sides of any steps have handrails.  Any raised decks and porches should have guardrails on the edges.  Have any leaves, snow, or ice cleared regularly.  Use sand or salt on walking paths during winter.  Clean up any spills in your garage right away. This includes oil or grease spills. What can I do in the bathroom?  Use night lights.  Install grab bars by the toilet and in the tub and shower. Do not use towel bars as grab bars.  Use non-skid mats or decals in the tub or shower.  If you need to sit down in the shower, use a plastic, non-slip stool.  Keep the floor dry. Clean up any water that spills on the floor as soon as it happens.  Remove soap buildup in the tub or shower regularly.  Attach bath mats securely with double-sided non-slip rug tape.  Do not have throw rugs and other things on the floor that can make you trip. What can I do  in the bedroom?  Use night lights.  Make sure that you have a light by your bed that is easy to reach.  Do not use any sheets or blankets that are too big for your bed. They should not hang down onto the floor.  Have a firm chair that has side arms. You can use this for support while you get dressed.  Do not have throw rugs and other things on the floor that can make you trip. What can I do in the kitchen?  Clean up any spills right away.  Avoid walking on wet floors.  Keep items that you use a lot in easy-to-reach places.  If you need to reach something above you, use a strong step stool that has a grab bar.  Keep electrical cords out of the way.  Do not use floor polish or wax that makes floors slippery. If you must use wax, use non-skid floor wax.  Do not have throw rugs and other things on the floor that can make you trip. What can I do with my stairs?  Do not leave any items on the stairs.  Make sure that there are  handrails on both sides of the stairs and use them. Fix handrails that are broken or loose. Make sure that handrails are as long as the stairways.  Check any carpeting to make sure that it is firmly attached to the stairs. Fix any carpet that is loose or worn.  Avoid having throw rugs at the top or bottom of the stairs. If you do have throw rugs, attach them to the floor with carpet tape.  Make sure that you have a light switch at the top of the stairs and the bottom of the stairs. If you do not have them, ask someone to add them for you. What else can I do to help prevent falls?  Wear shoes that:  Do not have high heels.  Have rubber bottoms.  Are comfortable and fit you well.  Are closed at the toe. Do not wear sandals.  If you use a stepladder:  Make sure that it is fully opened. Do not climb a closed stepladder.  Make sure that both sides of the stepladder are locked into place.  Ask someone to hold it for you, if possible.  Clearly mark  and make sure that you can see:  Any grab bars or handrails.  First and last steps.  Where the edge of each step is.  Use tools that help you move around (mobility aids) if they are needed. These include:  Canes.  Walkers.  Scooters.  Crutches.  Turn on the lights when you go into a dark area. Replace any light bulbs as soon as they burn out.  Set up your furniture so you have a clear path. Avoid moving your furniture around.  If any of your floors are uneven, fix them.  If there are any pets around you, be aware of where they are.  Review your medicines with your doctor. Some medicines can make you feel dizzy. This can increase your chance of falling. Ask your doctor what other things that you can do to help prevent falls. This information is not intended to replace advice given to you by your health care provider. Make sure you discuss any questions you have with your health care provider. Document Released: 08/29/2009 Document Revised: 04/09/2016 Document Reviewed: 12/07/2014 Elsevier Interactive Patient Education  2017 [employer].

## 2017-07-21 LAB — BASIC METABOLIC PANEL
BUN: 43 — AB (ref 4–21)
Creatinine: 2.2 — AB (ref 0.6–1.3)
Glucose: 149
Potassium: 4.7 (ref 3.4–5.3)
SODIUM: 139 (ref 137–147)

## 2017-07-22 DIAGNOSIS — I129 Hypertensive chronic kidney disease with stage 1 through stage 4 chronic kidney disease, or unspecified chronic kidney disease: Secondary | ICD-10-CM | POA: Diagnosis not present

## 2017-07-22 DIAGNOSIS — E1129 Type 2 diabetes mellitus with other diabetic kidney complication: Secondary | ICD-10-CM | POA: Diagnosis not present

## 2017-07-22 DIAGNOSIS — N184 Chronic kidney disease, stage 4 (severe): Secondary | ICD-10-CM | POA: Diagnosis not present

## 2017-07-22 DIAGNOSIS — I2510 Atherosclerotic heart disease of native coronary artery without angina pectoris: Secondary | ICD-10-CM | POA: Diagnosis not present

## 2017-07-28 ENCOUNTER — Encounter: Payer: Self-pay | Admitting: Adult Health

## 2017-07-28 ENCOUNTER — Non-Acute Institutional Stay (SKILLED_NURSING_FACILITY): Payer: Medicare HMO | Admitting: Adult Health

## 2017-07-28 DIAGNOSIS — I5032 Chronic diastolic (congestive) heart failure: Secondary | ICD-10-CM

## 2017-07-28 DIAGNOSIS — E1151 Type 2 diabetes mellitus with diabetic peripheral angiopathy without gangrene: Secondary | ICD-10-CM

## 2017-07-28 DIAGNOSIS — I25810 Atherosclerosis of coronary artery bypass graft(s) without angina pectoris: Secondary | ICD-10-CM

## 2017-07-28 DIAGNOSIS — N184 Chronic kidney disease, stage 4 (severe): Secondary | ICD-10-CM

## 2017-07-28 DIAGNOSIS — J3489 Other specified disorders of nose and nasal sinuses: Secondary | ICD-10-CM

## 2017-07-28 DIAGNOSIS — I481 Persistent atrial fibrillation: Secondary | ICD-10-CM | POA: Diagnosis not present

## 2017-07-28 DIAGNOSIS — I4819 Other persistent atrial fibrillation: Secondary | ICD-10-CM

## 2017-07-28 DIAGNOSIS — I70209 Unspecified atherosclerosis of native arteries of extremities, unspecified extremity: Secondary | ICD-10-CM | POA: Diagnosis not present

## 2017-07-28 NOTE — Progress Notes (Signed)
DATE:  07/28/2017   MRN:  [id number]  BIRTHDAY: 1920/02/11  Facility:  Nursing Home Location:  [facility] Room Number: 263-Z  LEVEL OF CARE:  SNF (33)  Contact Information    Name Oden Son [phone]  [phone]   [place] Daughter [phone]  [phone]       Code Status History    Date Active Date Inactive Code Status Order ID Comments User Context   06/23/2017  1:11 PM 06/26/2017  5:50 PM DNR [id number]  Burtis Junes, NP Inpatient   06/22/2017 11:46 PM 06/23/2017  1:11 PM DNR [id number]  Toy Baker, MD ED   06/11/2017  9:22 PM 06/13/2017  4:40 PM DNR [id number]  Ivor Costa, MD ED   10/20/2016  6:10 AM 10/21/2016  5:48 PM DNR [id number]  Rise Patience, MD Inpatient    Questions for Most Recent Historical Code Status (Order [id number])    Question Answer Comment   In the event of cardiac or respiratory ARREST Do not call a "code blue"    In the event of cardiac or respiratory ARREST Do not perform Intubation, CPR, defibrillation or ACLS    In the event of cardiac or respiratory ARREST Use medication by any route, position, wound care, and other measures to relive pain and suffering. May use oxygen, suction and manual treatment of airway obstruction as needed for comfort.        Chief Complaint  Patient presents with  . Medical Management of Chronic Issues    Routine visit    HISTORY OF PRESENT ILLNESS:  This is a 33-YO male seen for a routine visit.  He is a long-term care resident of [facility].  He has a PMH of CAD, S/P CABG, HTN, DM with hyperlipidemia. He was seen in the room today. He has salt, pepper and other spices on his bedside table. He is on a heart healthy diet and is not supposed to have additional salt to his food. He said that he does not use them but keeps the salt there to irritate the doctor.  Aside from the Lasix 80 mg daily, additional 80 mg  Q M-W-F was recently ordered due to weight gains. No SOB has been noted and left foot with 1+edema. He complained of his right nostril being dry.    PAST MEDICAL HISTORY:  Past Medical History:  Diagnosis Date  . Actinic keratitis   . CHF (congestive heart failure) ([place])   . Claudication ([place])   . Coronary artery disease    status post CABG, 1999  . Diabetes mellitus    Uncontrolled secondary to dietary noncompliance  . Diverticulosis   . DJD (degenerative joint disease)   . Hypertension    hypertensive syndrome with dyspnea -[facility], February, 2011 - EF 50% and cardiac bypass grafts all patent - responded to diuretics and blood pressure control - [name]  . Microscopic hematuria   . Onychomycosis   . Pneumonia 05/2017  . Prostate nodule   . PSVT (paroxysmal supraventricular tachycardia) ([place])   . Renal disorder   . Vitamin B12 deficiency   . Vitamin D deficiency      CURRENT MEDICATIONS: Reviewed  Patient's Medications  New Prescriptions   No medications on file  Previous Medications   ACETAMINOPHEN (TYLENOL) 325 MG TABLET    Take 650 mg by mouth every 6 (six) hours as needed for mild pain.  AMLODIPINE (NORVASC) 5 MG TABLET    Take 5 mg by mouth daily.   ASPIRIN EC 81 MG EC TABLET    Take 1 tablet (81 mg total) by mouth daily.   BISACODYL (DULCOLAX) 10 MG SUPPOSITORY    Place 10 mg rectally once as needed (FOR CONSTIPATION NOT RELIEVED BY MILK OF MAGNESIA).   CHOLECALCIFEROL (VITAMIN D) 1000 UNITS TABLET    Take 1,000 Units by mouth daily.   DOCUSATE SODIUM (COLACE) 100 MG CAPSULE    Take 100 mg by mouth daily. HOLD FOR LOOSE STOOLS   FLUTICASONE (FLONASE) 50 MCG/ACT NASAL SPRAY    Place 1 spray into both nostrils 2 (two) times daily as needed.    FUROSEMIDE (LASIX) 80 MG TABLET    Take 1 tablet (80 mg total) by mouth daily.   HYDRALAZINE (APRESOLINE) 10 MG TABLET    Take 10 mg by mouth 2 (two) times daily.   INSULIN ASPART (NOVOLOG) 100  UNIT/ML INJECTION    Inject 5-12 Units into the skin See admin instructions. "THREE TIMES A DAY BEFORE EACH MEAL AND AT BEDTIME PER SLIDING SCALE: BGL 150-299 = 5 units; 300-399 = 8 units; greater than 400 = 12 units"   INSULIN GLARGINE (LANTUS SOLOSTAR) 100 UNIT/ML SOLOSTAR PEN    Inject 30 Units into the skin at bedtime.   IPRATROPIUM-ALBUTEROL (DUONEB) 0.5-2.5 (3) MG/3ML SOLN    Take 3 mLs by nebulization every 6 (six) hours as needed.   ISOSORBIDE MONONITRATE (IMDUR) 30 MG 24 HR TABLET    Take 30 mg by mouth daily.    LATANOPROST (XALATAN) 0.005 % OPHTHALMIC SOLUTION    Place 1 drop into both eyes every evening.    LORATADINE (CLARITIN) 10 MG TABLET    Take 10 mg by mouth at bedtime.    METOPROLOL (LOPRESSOR) 50 MG TABLET    TAKE 1 TABLET (50 MG TOTAL) BY MOUTH 2 (TWO) TIMES DAILY.   MULTIPLE VITAMIN (MULTIVITAMIN) TABLET    Take 1 tablet by mouth daily.   NITROGLYCERIN (NITROSTAT) 0.4 MG SL TABLET    Place 0.4 mg under the tongue every 5 (five) minutes x 3 doses as needed for chest pain.    PANTOPRAZOLE (PROTONIX) 40 MG TABLET    Take 40 mg by mouth 2 (two) times daily.   POTASSIUM CHLORIDE SA (K-DUR,KLOR-CON) 20 MEQ TABLET    Take 1 tablet (20 mEq total) by mouth 2 (two) times daily.   ROSUVASTATIN (CRESTOR) 5 MG TABLET    Take 1 tablet (5 mg total) by mouth 3 (three) times a week. Take 1 tablet Mon, Wed, Fri   SENNOSIDES-DOCUSATE SODIUM (SENOKOT-S) 8.6-50 MG TABLET    Take 2 tablets by mouth at bedtime.   SODIUM CHLORIDE (OCEAN) 0.65 % SOLN NASAL SPRAY    Place 1 spray into both nostrils as needed for congestion. Use no more than 3 consecutive days   TAMSULOSIN (FLOMAX) 0.4 MG CAPS CAPSULE    Take 0.4 mg by mouth at bedtime.    VITAMIN B-12 (CYANOCOBALAMIN) 1000 MCG TABLET    Take 1,000 mcg by mouth daily.   Modified Medications   No medications on file  Discontinued Medications   No medications on file     No Known Allergies   REVIEW OF SYSTEMS:  GENERAL: no change in appetite, no  fatigue, no weight changes, no fever, chills or weakness MOUTH and THROAT: Denies oral discomfort, gingival pain or bleeding   RESPIRATORY: no cough, SOB, DOE, wheezing, hemoptysis CARDIAC:  no chest pain, or palpitations GI: no abdominal pain, diarrhea, constipation, heart burn, nausea or vomiting GU: Denies dysuria, frequency, hematuria, incontinence, or discharge PSYCHIATRIC: Denies feeling of depression or anxiety. No report of hallucinations, insomnia, paranoia, or agitation   PHYSICAL EXAMINATION  GENERAL APPEARANCE: Well nourished. In no acute distress.  SKIN:  Skin is warm and dry.  MOUTH and THROAT: Lips are without lesions. Oral mucosa is moist and without lesions. Tongue is normal in shape, size, and color and without lesions RESPIRATORY: breathing is even & unlabored, BS CTAB CARDIAC: RRR, no murmur,no extra heart sounds, no edema GI: abdomen soft, normal BS, no masses, no tenderness, no hepatomegaly, no splenomegaly EXTREMITIES:  Able to move X 4 extremities PSYCHIATRIC: Alert and oriented X 3. Affect and behavior are appropriate    LABS/RADIOLOGY: Labs reviewed: Basic Metabolic Panel:  Recent Labs  06/11/17 1907  06/23/17 0614 06/23/17 1441 06/24/17 0255 06/25/17 0315 06/26/17 0416 07/01/17 07/21/17  NA 127*  < >  --  138 136 138 139 137 139  K 5.0  < >  --  4.0 3.7 3.6 3.8 4.5 4.7  CL 93*  < >  --  106 107 105 104  --   --   CO2 22  < >  --  21* 22 24 24   --   --   GLUCOSE 275*  < >  --  195* 152* 139* 105*  --   --   BUN 127*  < >  --  41* 38* 30* 29* 47* 43*  CREATININE 4.14*  < >  --  2.11* 2.06* 1.87* 1.86* 2.1* 2.2*  CALCIUM 8.9  < >  --  8.6* 8.3* 8.7* 8.8*  --   --   MG 1.7  --  1.9 1.8  --   --   --   --   --   PHOS  --   --  4.0  --   --  3.4 3.8  --   --   < > = values in this interval not displayed. Liver Function Tests:  Recent Labs  06/22/17 1608 06/23/17 0108 06/23/17 1441 06/25/17 0315 06/26/17 0416 07/01/17  AST 27 23 22   --   --   13*  ALT 26 23 25   --   --  19  ALKPHOS 70 65 72  --   --  67  BILITOT 1.4* 1.5* 1.3*  --   --   --   PROT 7.0 6.4* 6.6  --   --   --   ALBUMIN 3.2* 3.0* 2.9* 3.0* 2.9*  --     Recent Labs  06/22/17 1608  LIPASE 21   CBC:  Recent Labs  06/11/17 1907  06/23/17 1441 06/24/17 0255 06/25/17 0315 06/26/17 0416 07/01/17  WBC 8.9  < > 9.5 6.5 6.0 6.9 8.6  NEUTROABS 6.3  --  7.3  --   --   --  6  HGB 11.4*  < > 9.4* 8.4* 9.8* 9.6* 10.7*  HCT 33.7*  < > 28.1* 25.3* 29.3* 29.1* 32*  MCV 86.6  < > 87.5 86.6 86.2 86.4  --   PLT 183  < > 177 187 217 237 262  < > = values in this interval not displayed. Lipid Panel:  Recent Labs  04/17/17 06/19/17  HDL 51 49   Cardiac Enzymes:  Recent Labs  06/23/17 1441 06/23/17 1927 06/24/17 0103  TROPONINI 0.16* 0.13* 0.14*   CBG:  Recent Labs  06/25/17 2104 06/26/17 0803 06/26/17 1122  GLUCAP 182* 63* 101*     ASSESSMENT/PLAN:  1. Dry nose - continue Saline spary 2 sprays in each nostril BID continuously to keep them moist   2. Chronic kidney disease, stage 4 (severe) (HCC) - will monitor, follows-up with [facility] Results  Component Value Date   CREATININE 2.2 (A) 07/21/2017     3. 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 Persistent atrial fibrillation (HCC) - rate controlled; continue metoprolol tartrate 50 mg 1 tab twice a day and aspirin EC 81 mg 1 tab daily   5. Chronic diastolic CHF (congestive heart failure) (HCC) - no SOB, recently added Lasix 18 mg every MWF in addition to 80 mg daily, isosorbide MN ER 30 mg 1 tab daily, KCl ER 20 meq 1 tab twice a day, hydralazine 10 mg 1 tab twice a day, metoprolol titrate 50 mg twice a day   6. Coronary  artery disease involving coronary bypass graft of native heart, angina presence unspecified - no complaints of chest pains, continue NTG when necessary, aspirin EC 81 mg 1 tab daily, rosuvastatin 10 mg 1 tab daily at bedtime      Goals of care: Long-term care     Monina C. Staunton - NP    [employer] [phone]

## 2017-07-30 ENCOUNTER — Ambulatory Visit (INDEPENDENT_AMBULATORY_CARE_PROVIDER_SITE_OTHER): Payer: Medicare HMO | Admitting: Endocrinology

## 2017-07-30 ENCOUNTER — Encounter: Payer: Self-pay | Admitting: Endocrinology

## 2017-07-30 VITALS — BP 110/62 | HR 67 | Wt 191.6 lb

## 2017-07-30 DIAGNOSIS — I70209 Unspecified atherosclerosis of native arteries of extremities, unspecified extremity: Secondary | ICD-10-CM | POA: Diagnosis not present

## 2017-07-30 DIAGNOSIS — E1151 Type 2 diabetes mellitus with diabetic peripheral angiopathy without gangrene: Secondary | ICD-10-CM | POA: Diagnosis not present

## 2017-07-30 MED ORDER — INSULIN GLARGINE 100 UNIT/ML SOLOSTAR PEN: 20.0000 [IU] | PEN_INJECTOR | Freq: Every day | SUBCUTANEOUS | 0 refills | Status: DC

## 2017-07-30 MED ORDER — INSULIN ASPART 100 UNIT/ML ~~LOC~~ SOLN: 8.0000 [IU] | Freq: Three times a day (TID) | SUBCUTANEOUS | 11 refills | Status: DC

## 2017-07-30 NOTE — Patient Instructions (Signed)
good diet significantly improve the control of your diabetes.  please let me know if you wish to be referred to a dietician.  high blood sugar is very risky to your health.  you should see an eye doctor and dentist every year.  It is very important to get all recommended vaccinations.  Controlling your blood pressure and cholesterol drastically reduces the damage diabetes does to your body.  Those who smoke should quit.  Please discuss these with your doctor.  check resident's blood sugar 4 times a day: before the 3 meals, and at bedtime.  also check if you have symptoms of your blood sugar being too high or too low.  please keep a record of the readings and bring it to your next appointment here (or you can bring the meter itself).  You can write it on any piece of paper.  please call us sooner if your blood sugar goes below 70, or if you have a lot of readings over 200. We will need to take this complex situation in stages For now, please: Take novolog, 8 units 3 times a day (just before each meal), no matter what cbg is, and: Reduce lantus to 20 units at bedtime.  Also, take extra novolog, 3 units for any cbg over 300.  Please fax cbg record next week. Please come back for a follow-up appointment in 2-3 months.

## 2017-07-30 NOTE — Progress Notes (Signed)
Subjective:    Patient ID: Cole [name], male    DOB: 09/04/1920, 83 y.o.   MRN: [id number]  HPI pt is referred by Dr Linna Darner, for diabetes.  Pt states DM was dx'ed in 2008; he has moderate neuropathy of the lower extremities; he has associated CAD, PAD, foot ulcers, and renal failure; he has been on insulin since mid-2018; pt says his diet is good, but exercise is limited by health problems; he has never had pancreatitis, pancreatic surgery, severe hypoglycemia or DKA.  He takes lantus 30 units QHS, and PRN novolog (averages a total of approx 15 units per day); lives at [facility], where insulin is administered to him. staff checks and records cbg's. History is mostly from son, due to memory loss.  Our staff has called [facility], and obtained cbg record.  It varies from 66-300's.  It is in general higher as the day goes on.  Past Medical History:  Diagnosis Date  . Actinic keratitis   . CHF (congestive heart failure) ([place])   . Claudication ([place])   . Coronary artery disease    status post CABG, 1999  . Diabetes mellitus    Uncontrolled secondary to dietary noncompliance  . Diverticulosis   . DJD (degenerative joint disease)   . Hypertension    hypertensive syndrome with dyspnea -[facility], February, 2011 - EF 50% and cardiac bypass grafts all patent - responded to diuretics and blood pressure control - [name]  . Microscopic hematuria   . Onychomycosis   . Pneumonia 05/2017  . Prostate nodule   . PSVT (paroxysmal supraventricular tachycardia) ([place])   . Renal disorder   . Vitamin B12 deficiency   . Vitamin D deficiency     Past Surgical History:  Procedure Laterality Date  . APPENDECTOMY    . CORONARY ARTERY BYPASS GRAFT      Social History   Social History  . Marital status: Married    Spouse name: N/A  . Number of children: N/A  . Years of education: N/A   Occupational History  . Not on file.   Social History Main Topics  . Smoking  status: Former Smoker    Types: Cigars  . Smokeless tobacco: Never Used     Comment: rarely smoked  . Alcohol use No  . Drug use: No  . Sexual activity: No   Other Topics Concern  . Not on file   Social History Narrative  . No narrative on file    Current Outpatient Prescriptions on File Prior to Visit  Medication Sig Dispense Refill  . acetaminophen (TYLENOL) 325 MG tablet Take 650 mg by mouth every 6 (six) hours as needed for mild pain.    Mar[facility] Kitchen amLODipine (NORVASC) 5 MG tablet Take 5 mg by mouth daily.    Marland Kitchen aspirin EC 81 MG EC tablet Take 1 tablet (81 mg total) by mouth daily.    . bisacodyl (DULCOLAX) 10 MG suppository Place 10 mg rectally once as needed (FOR CONSTIPATION NOT RELIEVED BY MILK OF MAGNESIA).    Marland Kitchen cholecalciferol (VITAMIN D) 1000 units tablet Take 1,000 Units by mouth daily.    Marland Kitchen docusate sodium (COLACE) 100 MG capsule Take 100 mg by mouth daily. HOLD FOR LOOSE STOOLS    . fluticasone (FLONASE) 50 MCG/ACT nasal spray Place 1 spray into both nostrils 2 (two) times daily as needed.     . furosemide (LASIX) 80 MG tablet Take 1 tablet (80 mg total) by  mouth daily. 30 tablet 2  . hydrALAZINE (APRESOLINE) 10 MG tablet Take 10 mg by mouth 2 (two) times daily.    Marland Kitchen ipratropium-albuterol (DUONEB) 0.5-2.5 (3) MG/3ML SOLN Take 3 mLs by nebulization every 6 (six) hours as needed.    . isosorbide mononitrate (IMDUR) 30 MG 24 hr tablet Take 30 mg by mouth daily.     Marland Kitchen latanoprost (XALATAN) 0.005 % ophthalmic solution Place 1 drop into both eyes every evening.     . loratadine (CLARITIN) 10 MG tablet Take 10 mg by mouth at bedtime.     . metoprolol (LOPRESSOR) 50 MG tablet TAKE 1 TABLET (50 MG TOTAL) BY MOUTH 2 (TWO) TIMES DAILY. 60 tablet 5  . Multiple Vitamin (MULTIVITAMIN) tablet Take 1 tablet by mouth daily.    . nitroGLYCERIN (NITROSTAT) 0.4 MG SL tablet Place 0.4 mg under the tongue every 5 (five) minutes x 3 doses as needed for chest pain.     . pantoprazole (PROTONIX) 40 MG  tablet Take 40 mg by mouth 2 (two) times daily.    . potassium chloride SA (K-DUR,KLOR-CON) 20 MEQ tablet Take 1 tablet (20 mEq total) by mouth 2 (two) times daily.    . rosuvastatin (CRESTOR) 5 MG tablet Take 1 tablet (5 mg total) by mouth 3 (three) times a week. Take 1 tablet Mon, Wed, Fri    . sennosides-docusate sodium (SENOKOT-S) 8.6-50 MG tablet Take 2 tablets by mouth at bedtime.    . sodium chloride (OCEAN) 0.65 % SOLN nasal spray Place 1 spray into both nostrils as needed for congestion. Use no more than 3 consecutive days    . tamsulosin (FLOMAX) 0.4 MG CAPS capsule Take 0.4 mg by mouth at bedtime.     . vitamin B-12 (CYANOCOBALAMIN) 1000 MCG tablet Take 1,000 mcg by mouth daily.      No current facility-administered medications on file prior to visit.     No Known Allergies  Family History  Problem Relation Age of Onset  . Asthma Neg Hx   . COPD Neg Hx   . Diabetes Neg Hx     BP 110/62   Pulse 67   Wt 191 lb 9.6 oz (86.9 kg)   SpO2 96%   BMI 29.13 kg/m     Review of Systems denies weight loss, blurry vision, headache, chest pain, sob, n/v, urinary frequency, muscle cramps, excessive diaphoresis, depression, cold intolerance, and rhinorrhea. He attributes polyuria to lasix.  He has easy bruising and memory loss.    Objective:   Physical Exam VS: see vs page GEN: no distress HEAD: head: no deformity eyes: no periorbital swelling, no proptosis external nose and ears are normal mouth: no lesion seen NECK: supple, thyroid is not enlarged CHEST WALL: no deformity LUNGS: clear to auscultation CV: reg rate and rhythm, no murmur ABD: abdomen is soft, nontender.  no hepatosplenomegaly.  not distended.  no hernia.  MUSCULOSKELETAL: muscle bulk and strength are grossly normal.  no obvious joint swelling.  gait is steady with a walker.   EXTEMITIES: no deformity.  Healed ulcers at the plantar aspects of both feet. feet are of normal temp.  1+ bilat leg edema. There is  bilateral onychomycosis of the toenails PULSES: dorsalis pedis intact bilat.  no carotid bruit NEURO:  cn 2-12 grossly intact.   readily moves all 4's.  sensation is intact to touch on the feet, but decreased from normal.  SKIN:  Normal texture and temperature.  No rash or suspicious lesion is  visible.  Old healed surgical scars on both legs (vein harvest).  Skin of both feet is dry.  NODES:  None palpable at the neck.  PSYCH: alert, well-oriented.  Does not appear anxious nor depressed.     Lab Results  Component Value Date   CREATININE 2.2 (A) 07/21/2017   BUN 43 (A) 07/21/2017   NA 139 07/21/2017   K 4.7 07/21/2017   CL 104 06/26/2017   CO2 24 06/26/2017   Lab Results  Component Value Date   HGBA1C 9.5 (H) 06/23/2017       Assessment & Plan:  Insulin-requiring type 2 DM, with PAD: The pattern of his cbg's indicates he needs less basal insulin Renal failure: this is the likely reason for the pattern of CBG's Frail elderly state: he is not a candidate for aggressive glycemic control.   Patient Instructions  good diet significantly improve the control of your diabetes.  please let me know if you wish to be referred to a dietician.  high blood sugar is very risky to your health.  you should see an eye doctor and dentist every year.  It is very important to get all recommended vaccinations.  Controlling your blood pressure and cholesterol drastically reduces the damage diabetes does to your body.  Those who smoke should quit.  Please discuss these with your doctor.  check resident's blood sugar 4 times a day: before the 3 meals, and at bedtime.  also check if you have symptoms of your blood sugar being too high or too low.  please keep a record of the readings and bring it to your next appointment here (or you can bring the meter itself).  You can write it on any piece of paper.  please call us sooner if your blood sugar goes below 70, or if you have a lot of readings over 200. We will  need to take this complex situation in stages For now, please: Take novolog, 8 units 3 times a day (just before each meal), no matter what cbg is, and: Reduce lantus to 20 units at bedtime.  Also, take extra novolog, 3 units for any cbg over 300.  Please fax cbg record next week. Please come back for a follow-up appointment in 2-3 months.

## 2017-08-05 DIAGNOSIS — I10 Essential (primary) hypertension: Secondary | ICD-10-CM | POA: Diagnosis not present

## 2017-08-05 DIAGNOSIS — D649 Anemia, unspecified: Secondary | ICD-10-CM | POA: Diagnosis not present

## 2017-08-06 DIAGNOSIS — D649 Anemia, unspecified: Secondary | ICD-10-CM | POA: Diagnosis not present

## 2017-08-06 DIAGNOSIS — I10 Essential (primary) hypertension: Secondary | ICD-10-CM | POA: Diagnosis not present

## 2017-08-06 LAB — BASIC METABOLIC PANEL
BUN: 51 — AB (ref 4–21)
Creatinine: 1.9 — AB (ref 0.6–1.3)
GLUCOSE: 110
POTASSIUM: 4.8 (ref 3.4–5.3)
SODIUM: 141 (ref 137–147)

## 2017-08-09 DIAGNOSIS — N184 Chronic kidney disease, stage 4 (severe): Secondary | ICD-10-CM | POA: Diagnosis not present

## 2017-08-09 DIAGNOSIS — I129 Hypertensive chronic kidney disease with stage 1 through stage 4 chronic kidney disease, or unspecified chronic kidney disease: Secondary | ICD-10-CM | POA: Diagnosis not present

## 2017-08-09 DIAGNOSIS — I2510 Atherosclerotic heart disease of native coronary artery without angina pectoris: Secondary | ICD-10-CM | POA: Diagnosis not present

## 2017-08-09 DIAGNOSIS — E1129 Type 2 diabetes mellitus with other diabetic kidney complication: Secondary | ICD-10-CM | POA: Diagnosis not present

## 2017-08-20 ENCOUNTER — Non-Acute Institutional Stay (SKILLED_NURSING_FACILITY): Payer: Medicare HMO | Admitting: Adult Health

## 2017-08-20 ENCOUNTER — Encounter: Payer: Self-pay | Admitting: Adult Health

## 2017-08-20 DIAGNOSIS — I10 Essential (primary) hypertension: Secondary | ICD-10-CM

## 2017-08-20 DIAGNOSIS — I25810 Atherosclerosis of coronary artery bypass graft(s) without angina pectoris: Secondary | ICD-10-CM

## 2017-08-20 DIAGNOSIS — I5042 Chronic combined systolic (congestive) and diastolic (congestive) heart failure: Secondary | ICD-10-CM | POA: Diagnosis not present

## 2017-08-20 DIAGNOSIS — I70209 Unspecified atherosclerosis of native arteries of extremities, unspecified extremity: Secondary | ICD-10-CM

## 2017-08-20 DIAGNOSIS — N184 Chronic kidney disease, stage 4 (severe): Secondary | ICD-10-CM

## 2017-08-20 DIAGNOSIS — N400 Enlarged prostate without lower urinary tract symptoms: Secondary | ICD-10-CM

## 2017-08-20 DIAGNOSIS — E1151 Type 2 diabetes mellitus with diabetic peripheral angiopathy without gangrene: Secondary | ICD-10-CM | POA: Diagnosis not present

## 2017-08-20 DIAGNOSIS — I481 Persistent atrial fibrillation: Secondary | ICD-10-CM | POA: Diagnosis not present

## 2017-08-20 DIAGNOSIS — I4819 Other persistent atrial fibrillation: Secondary | ICD-10-CM

## 2017-08-20 NOTE — Progress Notes (Signed)
DATE:  08/20/2017   MRN:  [id number]  BIRTHDAY: [birth year]  Facility:  Nursing Home Location:  [facility] [place] Room Number: 376-E  LEVEL OF CARE:  SNF (31)  Contact Information    Name [place] Son [phone]  [phone]   [place] Daughter [phone]  [phone]       Code Status History    Date Active Date Inactive Code Status Order ID Comments User Context   06/23/2017  1:11 PM 06/26/2017  5:50 PM DNR [id number]  Burtis Junes, NP Inpatient   06/22/2017 11:46 PM 06/23/2017  1:11 PM DNR [id number]  Toy Baker, MD ED   06/11/2017  9:22 PM 06/13/2017  4:40 PM DNR [id number]  Ivor Costa, MD ED   10/20/2016  6:10 AM 10/21/2016  5:48 PM DNR [id number]  Rise Patience, MD Inpatient    Questions for Most Recent Historical Code Status (Order [id number])    Question Answer Comment   In the event of cardiac or respiratory ARREST Do not call a "code blue"    In the event of cardiac or respiratory ARREST Do not perform Intubation, CPR, defibrillation or ACLS    In the event of cardiac or respiratory ARREST Use medication by any route, position, wound care, and other measures to relive pain and suffering. May use oxygen, suction and manual treatment of airway obstruction as needed for comfort.        Chief Complaint  Patient presents with  . Medical Management of Chronic Issues    Routine visit    HISTORY OF PRESENT ILLNESS:  This is a 50-YO male seen for a routine visit.  He is a long-term care resident at [facility].  He has a PMH of HTN, DM with hyperlipidemia, CAD, and is S/P CABG. He was seen in the room today. He was holding 2 papers which shows he has appointments on 10/13/17. Informed him that it will be re-scheduled by the scheduler since he prefers 1 appointment each day. Lantus was recently decreased from 30 units to 20 units and Novolog TID.     PAST MEDICAL HISTORY:    Past Medical History:  Diagnosis Date  . Actinic keratitis   . CHF (congestive heart failure) ([place])   . Claudication ([place])   . Coronary artery disease    status post CABG, 1999  . Diabetes mellitus    Uncontrolled secondary to dietary noncompliance  . Diverticulosis   . DJD (degenerative joint disease)   . Hypertension    hypertensive syndrome with dyspnea -[facility], February, 2011 - EF 50% and cardiac bypass grafts all patent - responded to diuretics and blood pressure control - [name]  . Microscopic hematuria   . Onychomycosis   . Pneumonia 05/2017  . Prostate nodule   . PSVT (paroxysmal supraventricular tachycardia) ([place])   . Renal disorder   . Vitamin B12 deficiency   . Vitamin D deficiency      CURRENT MEDICATIONS: Reviewed  Patient's Medications  New Prescriptions   No medications on file  Previous Medications   ACETAMINOPHEN (TYLENOL) 325 MG TABLET    Take 650 mg by mouth every 6 (six) hours as needed for mild pain.   AMLODIPINE (NORVASC) 5 MG TABLET    Take 5 mg by mouth daily.   ASPIRIN EC 81 MG EC TABLET    Take 1 tablet (81 mg total) by mouth daily.   BISACODYL (  DULCOLAX) 10 MG SUPPOSITORY    Place 10 mg rectally once as needed (FOR CONSTIPATION NOT RELIEVED BY MILK OF MAGNESIA).   CHOLECALCIFEROL (VITAMIN D) 1000 UNITS TABLET    Take 1,000 Units by mouth daily.   DOCUSATE SODIUM (COLACE) 100 MG CAPSULE    Take 100 mg by mouth daily. HOLD FOR LOOSE STOOLS   FLUTICASONE (FLONASE) 50 MCG/ACT NASAL SPRAY    Place 1 spray into both nostrils 2 (two) times daily as needed.    FUROSEMIDE (LASIX) 80 MG TABLET    Take 1 tablet (80 mg total) by mouth daily.   FUROSEMIDE (LASIX) 80 MG TABLET    Take 80 mg by mouth See admin instructions. Take 80 mg M-W-F at noon.  This is in addition to the 80 mg taken qam.   HYDRALAZINE (APRESOLINE) 10 MG TABLET    Take 10 mg by mouth 2 (two) times daily.   INSULIN ASPART (NOVOLOG) 100 UNIT/ML INJECTION    Inject  3-8 Units into the skin 3 (three) times daily before meals. Take 8 units AC sq no matter what CBG is.  Take an additional 3 units if CBG over 300.   INSULIN GLARGINE (LANTUS SOLOSTAR) 100 UNIT/ML SOLOSTAR PEN    Inject 20 Units into the skin at bedtime.   IPRATROPIUM-ALBUTEROL (DUONEB) 0.5-2.5 (3) MG/3ML SOLN    Take 3 mLs by nebulization every 6 (six) hours as needed.   ISOSORBIDE MONONITRATE (IMDUR) 30 MG 24 HR TABLET    Take 30 mg by mouth daily.    LATANOPROST (XALATAN) 0.005 % OPHTHALMIC SOLUTION    Place 1 drop into both eyes every evening.    LORATADINE (CLARITIN) 10 MG TABLET    Take 10 mg by mouth at bedtime.    METOPROLOL (LOPRESSOR) 50 MG TABLET    TAKE 1 TABLET (50 MG TOTAL) BY MOUTH 2 (TWO) TIMES DAILY.   MULTIPLE VITAMIN (MULTIVITAMIN) TABLET    Take 1 tablet by mouth daily.   NITROGLYCERIN (NITROSTAT) 0.4 MG SL TABLET    Place 0.4 mg under the tongue every 5 (five) minutes x 3 doses as needed for chest pain.    PANTOPRAZOLE (PROTONIX) 40 MG TABLET    Take 40 mg by mouth 2 (two) times daily.   POTASSIUM CHLORIDE SA (K-DUR,KLOR-CON) 20 MEQ TABLET    Take 1 tablet (20 mEq total) by mouth 2 (two) times daily.   ROSUVASTATIN (CRESTOR) 10 MG TABLET    Take 10 mg by mouth. M-W-F   SENNOSIDES-DOCUSATE SODIUM (SENOKOT-S) 8.6-50 MG TABLET    Take 2 tablets by mouth at bedtime.   SODIUM CHLORIDE (OCEAN) 0.65 % SOLN NASAL SPRAY    Place 1 spray into both nostrils as needed for congestion. Use no more than 3 consecutive days   TAMSULOSIN (FLOMAX) 0.4 MG CAPS CAPSULE    Take 0.4 mg by mouth at bedtime.    VITAMIN B-12 (CYANOCOBALAMIN) 1000 MCG TABLET    Take 1,000 mcg by mouth daily.   Modified Medications   No medications on file  Discontinued Medications   No medications on file     No Known Allergies   REVIEW OF SYSTEMS:  GENERAL: no change in appetite, no fatigue, no weight changes, no fever, chills or weakness MOUTH and THROAT: Denies oral discomfort, gingival pain or bleeding     RESPIRATORY: no cough, SOB, DOE, wheezing, hemoptysis CARDIAC: no chest pain, edema or palpitations GI: no abdominal pain, diarrhea, constipation, heart burn, nausea or vomiting GU: Denies  dysuria, frequency, hematuria, incontinence, or discharge PSYCHIATRIC: Denies feeling of depression or anxiety. No report of hallucinations, insomnia, paranoia, or agitation     PHYSICAL EXAMINATION  GENERAL APPEARANCE: Well nourished. In no acute distress. Normal body habitus SKIN:  Skin is warm and dry.  MOUTH and THROAT: Lips are without lesions. Oral mucosa is moist and without lesions.  RESPIRATORY: breathing is even & unlabored, BS CTAB CARDIAC: RRR, no murmur,no extra heart sounds, no edema GI: abdomen soft, normal BS, no masses, no tenderness, no hepatomegaly, no splenomegaly EXTREMITIES:  Able to move X 4 extremities, wears orthotics shoes PSYCHIATRIC: Alert and oriented X 3. Affect and behavior are appropriate    LABS/RADIOLOGY: Labs reviewed: Basic Metabolic Panel:  Recent Labs  06/11/17 1907  06/23/17 0614 06/23/17 1441 06/24/17 0255 06/25/17 0315 06/26/17 0416 07/01/17 07/21/17 08/06/17  NA 127*  < >  --  138 136 138 139 137 139 141  K 5.0  < >  --  4.0 3.7 3.6 3.8 4.5 4.7 4.8  CL 93*  < >  --  106 107 105 104  --   --   --   CO2 22  < >  --  21* 22 24 24   --   --   --   GLUCOSE 275*  < >  --  195* 152* 139* 105*  --   --   --   BUN 127*  < >  --  41* 38* 30* 29* 47* 43* 51*  CREATININE 4.14*  < >  --  2.11* 2.06* 1.87* 1.86* 2.1* 2.2* 1.9*  CALCIUM 8.9  < >  --  8.6* 8.3* 8.7* 8.8*  --   --   --   MG 1.7  --  1.9 1.8  --   --   --   --   --   --   PHOS  --   --  4.0  --   --  3.4 3.8  --   --   --   < > = values in this interval not displayed. Liver Function Tests:  Recent Labs  06/22/17 1608 06/23/17 0108 06/23/17 1441 06/25/17 0315 06/26/17 0416 07/01/17  AST 27 23 22   --   --  13*  ALT 26 23 25   --   --  19  ALKPHOS 70 65 72  --   --  67  BILITOT 1.4* 1.5*  1.3*  --   --   --   PROT 7.0 6.4* 6.6  --   --   --   ALBUMIN 3.2* 3.0* 2.9* 3.0* 2.9*  --     Recent Labs  06/22/17 1608  LIPASE 21   CBC:  Recent Labs  06/11/17 1907  06/23/17 1441 06/24/17 0255 06/25/17 0315 06/26/17 0416 07/01/17  WBC 8.9  < > 9.5 6.5 6.0 6.9 8.6  NEUTROABS 6.3  --  7.3  --   --   --  6  HGB 11.4*  < > 9.4* 8.4* 9.8* 9.6* 10.7*  HCT 33.7*  < > 28.1* 25.3* 29.3* 29.1* 32*  MCV 86.6  < > 87.5 86.6 86.2 86.4  --   PLT 183  < > 177 187 217 237 262  < > = values in this interval not displayed. Lipid Panel:  Recent Labs  04/17/17 06/19/17  HDL 51 49   Cardiac Enzymes:  Recent Labs  06/23/17 1441 06/23/17 1927 06/24/17 0103  TROPONINI 0.16* 0.13* 0.14*   CBG:  Recent  Labs  06/25/17 2104 06/26/17 0803 06/26/17 1122  GLUCAP 182* 63* 101*      ASSESSMENT/PLAN:   1. Persistent atrial fibrillation (HCC) - rate-controlled, continue metoprolol titrate 50 mg 1 tablet twice a day and aspirin EC 81 mg 1 tab daily   2. Chronic kidney disease, stage 4 (severe) ([place]) - follows up with [facility], next appointment is on 10/13/17 @ 10:15AM Lab Results  Component Value Date   CREATININE 1.9 (A) 08/06/2017    3. Coronary artery disease involvinAnd is a g coronary bypass graft of native heart, angina presence unspecified - no chest pain,  Continue isosorbide mononitrate ER 30 mg 1 tab daily, aspirin EC 81 mg 1 tab daily   4. Chronic combined systolic and diastolic CHF (congestive heart failure) (HCC) - no SOB, continue Lasix 80 mg 1 tab daily and 80 mg 1 tab every MWF at 12 noon, and hydralazine 10 mg 1 tab twice a day and metoprolol tartrate 50 mg 1 tab twice a day    5. Diabetes type II with atherosclerosis of arteries of extremities (HCC) - continue Lantus 20 units subcutaneous daily at bedtime, NovoLog 100 units/mL inject 8 units subcutaneous each meal, NovoLog 100 units/mL inject 3 units subcutaneous if CBG is over 300, follows-up with  endocrinology, next appointment 10/13/17 @ 2PM Lab Results  Component Value Date   HGBA1C 9.5 (H) 06/23/2017    6. Essential hypertension -  well-controlled, continue hydralazine 10 mg 1 tab twice a day, Amlodipine 5 mg 1 tab daily and metoprolol titrate 50 mg 1 tab twice a day    7. BPH - continue Tamsulosin 0.4 1 capsule daily     Goals of care:  Long-term care     Letonia Stead C. [place] - NP   [employer] [phone]

## 2017-08-23 ENCOUNTER — Encounter: Payer: Self-pay | Admitting: Internal Medicine

## 2017-08-23 ENCOUNTER — Encounter: Payer: Self-pay | Admitting: Interventional Cardiology

## 2017-08-31 ENCOUNTER — Encounter: Payer: Self-pay | Admitting: Internal Medicine

## 2017-08-31 ENCOUNTER — Non-Acute Institutional Stay (SKILLED_NURSING_FACILITY): Payer: Medicare HMO | Admitting: Internal Medicine

## 2017-08-31 DIAGNOSIS — R350 Frequency of micturition: Secondary | ICD-10-CM

## 2017-08-31 DIAGNOSIS — R32 Unspecified urinary incontinence: Secondary | ICD-10-CM

## 2017-08-31 DIAGNOSIS — R8290 Unspecified abnormal findings in urine: Secondary | ICD-10-CM | POA: Diagnosis not present

## 2017-08-31 DIAGNOSIS — Z79899 Other long term (current) drug therapy: Secondary | ICD-10-CM | POA: Diagnosis not present

## 2017-08-31 DIAGNOSIS — R319 Hematuria, unspecified: Secondary | ICD-10-CM | POA: Diagnosis not present

## 2017-08-31 DIAGNOSIS — N390 Urinary tract infection, site not specified: Secondary | ICD-10-CM | POA: Diagnosis not present

## 2017-08-31 DIAGNOSIS — R69 Illness, unspecified: Secondary | ICD-10-CM | POA: Diagnosis not present

## 2017-08-31 NOTE — Patient Instructions (Signed)
See assessment and plan under each diagnosis acutely for this visit  

## 2017-08-31 NOTE — Progress Notes (Signed)
NURSING HOME LOCATION:  [facility] ROOM NUMBER:  [id number]  CODE STATUS:  Full Code  PCP:  [name], MD  [address] [place] [address]  This is a nursing facility follow up for specific acute issue of frequency.  Interim medical record and care since last [facility] visit was updated with review of diagnostic studies and change in clinical status since last visit were documented.  HPI: He states that he has had frequency and overflow incontinence symptoms approximately  2 weeks. These symptoms have been progressive. He's gotten to the point where he actually urinates in a cup at the bedside, being unable to get to the bathroom in time to void. [facility] urinalysis today revealed 50-100 white cells, 2+ bacteria, and negative nitrite. Also for 2 weeks he states that food has "not tasted right".  He has had 2 loose stools over the last 2 days. He denies any clay-colored stool or Coke-colored urine. His renal function had been improving, creatinine was 1.9 and BUN 51 on 9/21. Glucoses have not been well controlled. He was seen by Dr. Loanne Drilling ; adjustments were made in both the basal and pre-meal insulins. Fasting glucoses have been in the 220-230 range. At lunch values are roughly 330-390 and at bedtime sugars 164-316.  Review of systems:  He was able to identify the president. He initially gave the date as September , 1916. When he was asked the year for the third time he did give the year as 2018  Constitutional: No fever,significant weight change  Eyes: No redness, discharge, pain, vision change ENT/mouth: No nasal congestion,  purulent discharge, earache,change in hearing ,sore throat  Cardiovascular: No chest pain, palpitations,paroxysmal nocturnal dyspnea, claudication, edema  Respiratory: No cough, sputum production,hemoptysis, DOE , significant snoring,apnea  Gastrointestinal: No heartburn,dysphagia,abdominal pain, nausea / vomiting,rectal bleeding,  melena Genitourinary: No dysuria,hematuria, pyuria Musculoskeletal: No joint stiffness, joint swelling, weakness,pain Dermatologic: No rash, pruritus, change in appearance of skin Neurologic: No dizziness,headache,syncope, seizures, numbness , tingling Psychiatric: No significant anxiety , depression, insomnia Endocrine: No change in hair/skin/ nails, excessive thirst, excessive hunger Hematologic/lymphatic: No significant bruising, lymphadenopathy,abnormal bleeding Allergy/immunology: No itchy/ watery eyes, significant sneezing, urticaria, angioedema  Physical exam:  Pertinent or positive findings: he appears lethargic. He has dense arcus senilis. He is completely edentulous. Heart rhythm and rate are slow and irregular. Chest is surprisingly clear; but there is some bronchovesicular character at the bases. Abdomen is massive& diffusely tender ,most uncomfortable in the suprapubic area. Pedal pulses are decreased. Skin is hot and dry. There is an uriniferous odor to his person.  General appearance:Adequately nourished; no acute distress , increased work of breathing is present.   Lymphatic: No lymphadenopathy about the head, neck, axilla . Eyes: No conjunctival inflammation or lid edema is present. There is no scleral icterus. Ears:  External ear exam shows no significant lesions or deformities.   Nose:  External nasal examination shows no deformity or inflammation. Nasal mucosa are pink and moist without lesions ,exudates Oral exam: lips and gums are healthy appearing.There is no oropharyngeal erythema or exudate . Neck:  No thyromegaly, masses, tenderness noted.    Heart:  No gallop, murmur, click, rub .  Lungs: without wheezes, rhonchi,rales , rubs. Abdomen:Bowel sounds are normal. Abdomen is soft  with no organomegaly, hernias,masses. GU: deferred  Extremities:  No cyanosis, clubbing,edema  Skin:  w/o tenting. No significant lesions or rash.   #1 frequency and incontinence with  abnormal urinalysis  #2  malaise #3 change in taste with poor oral intake  Plan: Ceftriaxone 1 g every 12 hours I am pending return of culture

## 2017-09-01 ENCOUNTER — Encounter: Payer: Self-pay | Admitting: Internal Medicine

## 2017-09-01 DIAGNOSIS — D649 Anemia, unspecified: Secondary | ICD-10-CM | POA: Diagnosis not present

## 2017-09-01 DIAGNOSIS — E119 Type 2 diabetes mellitus without complications: Secondary | ICD-10-CM | POA: Diagnosis not present

## 2017-09-01 LAB — CBC AND DIFFERENTIAL
HEMATOCRIT: 28 — AB (ref 41–53)
Hemoglobin: 9.4 — AB (ref 13.5–17.5)
Platelets: 211 (ref 150–399)
WBC: 9.6

## 2017-09-01 LAB — BASIC METABOLIC PANEL
BUN: 60 — AB (ref 4–21)
CREATININE: 2.6 — AB (ref 0.6–1.3)
Glucose: 125
Potassium: 4.8 (ref 3.4–5.3)
Sodium: 140 (ref 137–147)

## 2017-09-02 ENCOUNTER — Encounter: Payer: Self-pay | Admitting: Internal Medicine

## 2017-09-02 ENCOUNTER — Encounter: Payer: Self-pay | Admitting: *Deleted

## 2017-09-02 DIAGNOSIS — N390 Urinary tract infection, site not specified: Secondary | ICD-10-CM

## 2017-09-02 HISTORY — DX: Urinary tract infection, site not specified: N39.0

## 2017-09-02 NOTE — Progress Notes (Signed)
[facility] Abstracted ([place])

## 2017-09-03 ENCOUNTER — Encounter: Payer: Self-pay | Admitting: Interventional Cardiology

## 2017-09-03 ENCOUNTER — Ambulatory Visit (INDEPENDENT_AMBULATORY_CARE_PROVIDER_SITE_OTHER): Payer: Medicare HMO | Admitting: Interventional Cardiology

## 2017-09-03 VITALS — BP 114/54 | HR 54 | Ht 68.0 in | Wt 185.0 lb

## 2017-09-03 DIAGNOSIS — E1151 Type 2 diabetes mellitus with diabetic peripheral angiopathy without gangrene: Secondary | ICD-10-CM

## 2017-09-03 DIAGNOSIS — I471 Supraventricular tachycardia: Secondary | ICD-10-CM | POA: Diagnosis not present

## 2017-09-03 DIAGNOSIS — I70209 Unspecified atherosclerosis of native arteries of extremities, unspecified extremity: Secondary | ICD-10-CM

## 2017-09-03 DIAGNOSIS — I25709 Atherosclerosis of coronary artery bypass graft(s), unspecified, with unspecified angina pectoris: Secondary | ICD-10-CM

## 2017-09-03 DIAGNOSIS — I5042 Chronic combined systolic (congestive) and diastolic (congestive) heart failure: Secondary | ICD-10-CM
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
## 2017-09-03 NOTE — Patient Instructions (Signed)
Medication Instructions:  1) HOLD Furosemide tomorrow (09/04/17).  STARTING Sunday, 09/05/17, take Furosemide 80mg  once daily.  Labwork: BMET in 1 week and fax to [phone].  Testing/Procedures: None  Follow-Up: Your physician recommends that you schedule a follow-up appointment in: 1 month with Truitt Merle, NP.  Your physician wants you to follow-up in: 6 months with Dr. Tamala Julian.  You will receive a reminder letter in the mail two months in advance. If you don't receive a letter, please call our office to schedule the follow-up appointment.    Any Other Special Instructions Will Be Listed Below (If Applicable).     If you need a refill on your cardiac medications before your next appointment, please call your pharmacy.

## 2017-09-10 DIAGNOSIS — D649 Anemia, unspecified: Secondary | ICD-10-CM | POA: Diagnosis not present

## 2017-09-10 DIAGNOSIS — I130 Hypertensive heart and chronic kidney disease with heart failure and stage 1 through stage 4 chronic kidney disease, or unspecified chronic kidney disease: Secondary | ICD-10-CM | POA: Diagnosis not present

## 2017-09-10 DIAGNOSIS — E1169 Type 2 diabetes mellitus with other specified complication: Secondary | ICD-10-CM | POA: Diagnosis not present

## 2017-09-10 DIAGNOSIS — I5043 Acute on chronic combined systolic (congestive) and diastolic (congestive) heart failure: Secondary | ICD-10-CM | POA: Diagnosis not present

## 2017-09-10 LAB — BASIC METABOLIC PANEL
BUN: 54 — AB (ref 4–21)
Creatinine: 1.9 — AB (ref 0.6–1.3)
Glucose: 115
POTASSIUM: 4.8 (ref 3.4–5.3)
SODIUM: 136 — AB (ref 137–147)

## 2017-09-17 ENCOUNTER — Encounter: Payer: Self-pay | Admitting: Adult Health

## 2017-09-17 ENCOUNTER — Non-Acute Institutional Stay (SKILLED_NURSING_FACILITY): Payer: Medicare HMO | Admitting: Adult Health

## 2017-09-17 DIAGNOSIS — I10 Essential (primary) hypertension: Secondary | ICD-10-CM

## 2017-09-17 DIAGNOSIS — I481 Persistent atrial fibrillation: Secondary | ICD-10-CM

## 2017-09-17 DIAGNOSIS — I25810 Atherosclerosis of coronary artery bypass graft(s) without angina pectoris: Secondary | ICD-10-CM | POA: Diagnosis not present

## 2017-09-17 DIAGNOSIS — I5032 Chronic diastolic (congestive) heart failure: Secondary | ICD-10-CM

## 2017-09-17 DIAGNOSIS — I4819 Other persistent atrial fibrillation: Secondary | ICD-10-CM

## 2017-09-17 DIAGNOSIS — N400 Enlarged prostate without lower urinary tract symptoms: Secondary | ICD-10-CM

## 2017-09-17 DIAGNOSIS — S71102S Unspecified open wound, left thigh, sequela: Secondary | ICD-10-CM

## 2017-09-17 DIAGNOSIS — S81802S Unspecified open wound, left lower leg, sequela: Secondary | ICD-10-CM | POA: Diagnosis not present

## 2017-09-17 DIAGNOSIS — E1151 Type 2 diabetes mellitus with diabetic peripheral angiopathy without gangrene: Secondary | ICD-10-CM | POA: Diagnosis not present

## 2017-09-17 DIAGNOSIS — I70209 Unspecified atherosclerosis of native arteries of extremities, unspecified extremity: Secondary | ICD-10-CM

## 2017-09-17 DIAGNOSIS — N184 Chronic kidney disease, stage 4 (severe): Secondary | ICD-10-CM | POA: Diagnosis not present

## 2017-09-17 NOTE — Progress Notes (Signed)
DATE:  09/17/2017   MRN:  [id number]  BIRTHDAY: [birth year]  Facility:  Nursing Home Location:  [facility] Room Number: 419-F  LEVEL OF CARE:  SNF (31)  Contact Information    Name [place] Son [phone]  [phone]   [place] Daughter [phone]  [phone]       Code Status History    Date Active Date Inactive Code Status Order ID Comments User Context   06/23/2017  1:11 PM 06/26/2017  5:50 PM DNR [id number]  Burtis Junes, NP Inpatient   06/22/2017 11:46 PM 06/23/2017  1:11 PM DNR [id number]  Toy Baker, MD ED   06/11/2017  9:22 PM 06/13/2017  4:40 PM DNR [id number]  Ivor Costa, MD ED   10/20/2016  6:10 AM 10/21/2016  5:48 PM DNR [id number]  Rise Patience, MD Inpatient    Questions for Most Recent Historical Code Status (Order [id number])    Question Answer Comment   In the event of cardiac or respiratory ARREST Do not call a "code blue"    In the event of cardiac or respiratory ARREST Do not perform Intubation, CPR, defibrillation or ACLS    In the event of cardiac or respiratory ARREST Use medication by any route, position, wound care, and other measures to relive pain and suffering. May use oxygen, suction and manual treatment of airway obstruction as needed for comfort.        Chief Complaint  Patient presents with  . Medical Management of Chronic Issues    Routine [facility] visit, also had weight gain of greater than 3 pounds    HISTORY OF PRESENT ILLNESS:  This is a 14-YO male seen for a routine visit.  He is a long-term care resident of [facility].  He has a PMH of hypertension, diabetes mellitus with hyperlipidemia, CAD, and is status post CABG. He was reported to have gained >3 lbs for this week. He was noted to have wound with black eschar on his left shin. Skin is not warm to touch.   PAST MEDICAL HISTORY:  Past Medical History:  Diagnosis Date    . Actinic keratitis   . CHF (congestive heart failure) ([place])   . Claudication ([place])   . Coronary artery disease    status post CABG, 1999  . Diabetes mellitus    Uncontrolled secondary to dietary noncompliance  . Diverticulosis   . DJD (degenerative joint disease)   . Hypertension    hypertensive syndrome with dyspnea -[facility], February, 2011 - EF 50% and cardiac bypass grafts all patent - responded to diuretics and blood pressure control - [name]  . Microscopic hematuria   . Onychomycosis   . Pneumonia 05/2017  . Prostate nodule   . PSVT (paroxysmal supraventricular tachycardia) ([place])   . Renal disorder   . UTI (urinary tract infection) 09/02/2017   sensitive to Ceftriaxone  . Vitamin B12 deficiency   . Vitamin D deficiency      CURRENT MEDICATIONS: Reviewed  Patient's Medications  New Prescriptions   No medications on file  Previous Medications   ACETAMINOPHEN (TYLENOL) 325 MG TABLET    Take 650 mg by mouth every 6 (six) hours as needed for mild pain.   AMLODIPINE (NORVASC) 5 MG TABLET    Take 5 mg by mouth daily.   ASPIRIN EC 81 MG EC TABLET    Take 1 tablet (81 mg total) by mouth  daily.   BISACODYL (DULCOLAX) 10 MG SUPPOSITORY    Place 10 mg rectally once as needed (FOR CONSTIPATION NOT RELIEVED BY MILK OF MAGNESIA).   CHOLECALCIFEROL (VITAMIN D) 1000 UNITS TABLET    Take 1,000 Units by mouth daily.   DOCUSATE SODIUM (COLACE) 100 MG CAPSULE    Take 100 mg by mouth daily. HOLD FOR LOOSE STOOLS   FUROSEMIDE (LASIX) 80 MG TABLET    Take 1 tablet (80 mg total) by mouth daily.   HYDRALAZINE (APRESOLINE) 10 MG TABLET    Take 10 mg by mouth 2 (two) times daily.   INSULIN ASPART (NOVOLOG) 100 UNIT/ML INJECTION    Inject 3-8 Units into the skin 3 (three) times daily before meals. Take 8 units AC sq no matter what CBG is.  Take an additional 3 units if CBG over 300.   INSULIN GLARGINE (LANTUS SOLOSTAR) 100 UNIT/ML SOLOSTAR PEN    Inject 20 Units into  the skin at bedtime.   IPRATROPIUM-ALBUTEROL (DUONEB) 0.5-2.5 (3) MG/3ML SOLN    Take 3 mLs by nebulization every 6 (six) hours as needed.   ISOSORBIDE MONONITRATE (IMDUR) 30 MG 24 HR TABLET    Take 30 mg by mouth daily.    LATANOPROST (XALATAN) 0.005 % OPHTHALMIC SOLUTION    Place 1 drop into both eyes every evening.    LORATADINE (CLARITIN) 10 MG TABLET    Take 10 mg by mouth at bedtime.    METOPROLOL (LOPRESSOR) 50 MG TABLET    TAKE 1 TABLET (50 MG TOTAL) BY MOUTH 2 (TWO) TIMES DAILY.   MULTIPLE VITAMIN (MULTIVITAMIN) TABLET    Take 1 tablet by mouth daily.   NEOMYCIN-BACITRACIN-POLYMYXIN (TRIPLE ANTIBIOTIC EX)    Apply 1 application topically daily. Apply to right nostril   NITROGLYCERIN (NITROSTAT) 0.4 MG SL TABLET    Place 0.4 mg under the tongue every 5 (five) minutes x 3 doses as needed for chest pain.    PANTOPRAZOLE (PROTONIX) 40 MG TABLET    Take 40 mg by mouth 2 (two) times daily.   POTASSIUM CHLORIDE SA (K-DUR,KLOR-CON) 20 MEQ TABLET    Take 1 tablet (20 mEq total) by mouth 2 (two) times daily.   ROSUVASTATIN (CRESTOR) 10 MG TABLET    Take 10 mg by mouth. M-W-F   SENNOSIDES-DOCUSATE SODIUM (SENOKOT-S) 8.6-50 MG TABLET    Take 2 tablets by mouth at bedtime.   TAMSULOSIN (FLOMAX) 0.4 MG CAPS CAPSULE    Take 0.4 mg by mouth at bedtime.    VITAMIN B-12 (CYANOCOBALAMIN) 1000 MCG TABLET    Take 1,000 mcg by mouth daily.   Modified Medications   No medications on file  Discontinued Medications   CEFAZOLIN (ANCEF) 1 G INJECTION    Inject 1 gm into the muscle every 12 hours for 7 days for UTI.   FLUTICASONE (FLONASE) 50 MCG/ACT NASAL SPRAY    Place 1 spray into both nostrils 2 (two) times daily as needed.      No Known Allergies   REVIEW OF SYSTEMS:  GENERAL: no change in appetite, no fatigue, no weight changes, no fever, chills or weakness MOUTH and THROAT: Denies oral discomfort, gingival pain or bleeding, pain from teeth or hoarseness   RESPIRATORY: no cough, SOB, DOE, wheezing,  hemoptysis CARDIAC: no chest pain,or palpitations,+edema GI: no abdominal pain, diarrhea, constipation, heart burn, nausea or vomiting GU: Denies dysuria, frequency, hematuria, incontinence, or discharge PSYCHIATRIC: Denies feeling of depression or anxiety. No report of hallucinations, insomnia, paranoia, or agitation  PHYSICAL EXAMINATION  GENERAL APPEARANCE: Well nourished. In no acute distress. Normal body habitus SKIN:  Skin is warm and dry. There are no suspicious lesions or rash MOUTH and THROAT: Lips are without lesions. Oral mucosa is moist and without lesions. RESPIRATORY: breathing is even & unlabored, BS CTAB CARDIAC: RRR, no murmur,no extra heart sounds, LLE 1+ edema GI: abdomen soft, normal BS, no masses, no tenderness, no hepatomegaly, no splenomegaly EXTREMITIES: Able to move X 4 extremities PSYCHIATRIC: Alert and oriented X 3. Affect and behavior are appropriate   LABS/RADIOLOGY: Labs reviewed: Basic Metabolic Panel:  Recent Labs  06/11/17 1907  06/23/17 0614 06/23/17 1441 06/24/17 0255 06/25/17 0315 06/26/17 0416  08/06/17 09/01/17 09/10/17  NA 127*  < >  --  138 136 138 139  < > 141 140 136*  K 5.0  < >  --  4.0 3.7 3.6 3.8  < > 4.8 4.8 4.8  CL 93*  < >  --  106 107 105 104  --   --   --   --   CO2 22  < >  --  21* 22 24 24   --   --   --   --   GLUCOSE 275*  < >  --  195* 152* 139* 105*  --   --   --   --   BUN 127*  < >  --  41* 38* 30* 29*  < > 51* 60* 54*  CREATININE 4.14*  < >  --  2.11* 2.06* 1.87* 1.86*  < > 1.9* 2.6* 1.9*  CALCIUM 8.9  < >  --  8.6* 8.3* 8.7* 8.8*  --   --   --   --   MG 1.7  --  1.9 1.8  --   --   --   --   --   --   --   PHOS  --   --  4.0  --   --  3.4 3.8  --   --   --   --   < > = values in this interval not displayed. Liver Function Tests:  Recent Labs  06/22/17 1608 06/23/17 0108 06/23/17 1441 06/25/17 0315 06/26/17 0416 07/01/17  AST 27 23 22   --   --  13*  ALT 26 23 25   --   --  19  ALKPHOS 70 65 72  --   --   67  BILITOT 1.4* 1.5* 1.3*  --   --   --   PROT 7.0 6.4* 6.6  --   --   --   ALBUMIN 3.2* 3.0* 2.9* 3.0* 2.9*  --     Recent Labs  06/22/17 1608  LIPASE 21   CBC:  Recent Labs  06/11/17 1907  06/23/17 1441 06/24/17 0255 06/25/17 0315 06/26/17 0416 07/01/17 09/01/17  WBC 8.9  < > 9.5 6.5 6.0 6.9 8.6 9.6  NEUTROABS 6.3  --  7.3  --   --   --  6  --   HGB 11.4*  < > 9.4* 8.4* 9.8* 9.6* 10.7* 9.4*  HCT 33.7*  < > 28.1* 25.3* 29.3* 29.1* 32* 28*  MCV 86.6  < > 87.5 86.6 86.2 86.4  --   --   PLT 183  < > 177 187 217 237 262 211  < > = values in this interval not displayed. Lipid Panel:  Recent Labs  04/17/17 06/19/17  HDL 51 49   Cardiac Enzymes:  Recent  Labs  06/23/17 1441 06/23/17 1927 06/24/17 0103  TROPONINI 0.16* 0.13* 0.14*   CBG:  Recent Labs  06/25/17 2104 06/26/17 0803 06/26/17 1122  GLUCAP 182* 63* 101*     ASSESSMENT/PLAN:  1. Chronic diastolic CHF (congestive heart failure) ([facility]) - no SOB, gained > 3lbs this week, will give Lasix 80 mg by mouth 1 extra today and continue Lasix 80 mg daily, metoprolol tartrate 50 mg 1 tablet twice a day and hydralazine 10 mg 1 tablet twice a day   2. Persistent atrial fibrillation ([facility]) - rate controlled, continue metoprolol tartrate 50 mg 1 tablet twice a day   3. Essential hypertension - well-controlled, continue amlodipine 5 mg daily, metoprolol tartrate 50 mg 1 tablet twice a day, and hydralazine 10 mg 1 tablet twice a day   4. 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 Benign prostatic hyperplasia, unspecified whether lower urinary tract symptoms present - No complaints of urinary retention, continue tamsulosin 0.4 mg 1 capsule 2 at bedtime,  6. Chronic kidney disease, stage 4  (severe) ([facility]) - will monitor Lab Results  Component Value Date   CREATININE 1.9 (A) 09/10/2017    7. CAD - no complaints of chest pain, continue NTG when necessary, isosorbide MN ER 30 mg daily, rosuvastatin 10 mg 1 tab on Monday, Wednesday and Friday, and aspirin EC 81 mg 1 tab daily   8. Wound on lower extremity -  no signs of infection, will start treatment on left shin wound, keep skin clean and dry     Goals of care:  Long-term care     Monina C. Cement - NP    [employer] [phone]

## 2017-10-01 DIAGNOSIS — E119 Type 2 diabetes mellitus without complications: Secondary | ICD-10-CM | POA: Diagnosis not present

## 2017-10-01 DIAGNOSIS — D649 Anemia, unspecified: Secondary | ICD-10-CM | POA: Diagnosis not present

## 2017-10-01 DIAGNOSIS — N390 Urinary tract infection, site not specified: Secondary | ICD-10-CM | POA: Diagnosis not present

## 2017-10-11 ENCOUNTER — Ambulatory Visit: Payer: Medicare HMO | Admitting: Nurse Practitioner

## 2017-10-11 ENCOUNTER — Encounter: Payer: Self-pay | Admitting: Nurse Practitioner

## 2017-10-11 VITALS — BP 110/50 | HR 69 | Ht 68.0 in | Wt 196.1 lb

## 2017-10-11 DIAGNOSIS — I5042 Chronic combined systolic (congestive) and diastolic (congestive) heart failure: Secondary | ICD-10-CM | POA: Diagnosis not present

## 2017-10-11 DIAGNOSIS — I480 Paroxysmal atrial fibrillation: Secondary | ICD-10-CM

## 2017-10-11 DIAGNOSIS — I2510 Atherosclerotic heart disease of native coronary artery without angina pectoris: Secondary | ICD-10-CM
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
## 2017-10-11 NOTE — Patient Instructions (Addendum)
We will be checking the following labs today - BMET, CBC, BNP   Medication Instructions:    Continue with your current medicines.     Testing/Procedures To Be Arranged:  N/A  Follow-Up:   See me in about 6 to 8 weeks  See Dr. Tamala Julian as planned    Other Special Instructions:   We will see what your labs show and then decide if we need to increase your fluid medicines.    If you need a refill on your cardiac medications before your next appointment, please call your pharmacy.   Call the [place] office at [phone] if you have any questions, problems or concerns.

## 2017-10-12 LAB — CBC
Hematocrit: 32.3 % — ABNORMAL LOW (ref 37.5–51.0)
Hemoglobin: 10.5 g/dL — ABNORMAL LOW (ref 13.0–17.7)
MCH: 29.1 pg (ref 26.6–33.0)
MCHC: 32.5 g/dL (ref 31.5–35.7)
MCV: 90 fL (ref 79–97)
Platelets: 229 10*3/uL (ref 150–379)
RBC: 3.61 x10E6/uL — ABNORMAL LOW (ref 4.14–5.80)
RDW: 14.6 % (ref 12.3–15.4)
WBC: 8.4 10*3/uL (ref 3.4–10.8)

## 2017-10-12 LAB — BASIC METABOLIC PANEL
BUN/Creatinine Ratio: 25 — ABNORMAL HIGH (ref 10–24)
BUN: 51 mg/dL — ABNORMAL HIGH (ref 10–36)
CO2: 20 mmol/L (ref 20–29)
Calcium: 9 mg/dL (ref 8.6–10.2)
Chloride: 100 mmol/L (ref 96–106)
Creatinine, Ser: 2.07 mg/dL — ABNORMAL HIGH (ref 0.76–1.27)
GFR calc Af Amer: 31 mL/min/{1.73_m2} — ABNORMAL LOW (ref >59)
GFR calc non Af Amer: 26 mL/min/{1.73_m2} — ABNORMAL LOW (ref >59)
Glucose: 225 mg/dL — ABNORMAL HIGH (ref 65–99)
Potassium: 4.7 mmol/L (ref 3.5–5.2)
Sodium: 139 mmol/L (ref 134–144)

## 2017-10-12 LAB — PRO B NATRIURETIC PEPTIDE: NT-Pro BNP: 2684 pg/mL — ABNORMAL HIGH (ref 0–486)

## 2017-10-13 ENCOUNTER — Ambulatory Visit (INDEPENDENT_AMBULATORY_CARE_PROVIDER_SITE_OTHER): Payer: Medicare HMO | Admitting: Endocrinology

## 2017-10-13 ENCOUNTER — Encounter: Payer: Self-pay | Admitting: Endocrinology

## 2017-10-13 VITALS — BP 122/62 | HR 75 | Wt 192.4 lb

## 2017-10-13 DIAGNOSIS — I2510 Atherosclerotic heart disease of native coronary artery without angina pectoris: Secondary | ICD-10-CM | POA: Diagnosis not present

## 2017-10-13 DIAGNOSIS — I70209 Unspecified atherosclerosis of native arteries of extremities, unspecified extremity: Secondary | ICD-10-CM | POA: Diagnosis not present

## 2017-10-13 DIAGNOSIS — N184 Chronic kidney disease, stage 4 (severe): Secondary | ICD-10-CM | POA: Diagnosis not present

## 2017-10-13 DIAGNOSIS — E1122 Type 2 diabetes mellitus with diabetic chronic kidney disease: Secondary | ICD-10-CM | POA: Diagnosis not present

## 2017-10-13 DIAGNOSIS — E1151 Type 2 diabetes mellitus with diabetic peripheral angiopathy without gangrene: Secondary | ICD-10-CM

## 2017-10-13 DIAGNOSIS — I129 Hypertensive chronic kidney disease with stage 1 through stage 4 chronic kidney disease, or unspecified chronic kidney disease: Secondary | ICD-10-CM | POA: Diagnosis not present

## 2017-10-13 LAB — POCT GLYCOSYLATED HEMOGLOBIN (HGB A1C): Hemoglobin A1C: 8.4

## 2017-10-13 MED ORDER — INSULIN GLARGINE 100 UNIT/ML SOLOSTAR PEN: 14.0000 [IU] | PEN_INJECTOR | Freq: Every day | SUBCUTANEOUS | 0 refills | Status: DC

## 2017-10-13 MED ORDER — INSULIN ASPART 100 UNIT/ML ~~LOC~~ SOLN: 10.0000 [IU] | Freq: Three times a day (TID) | SUBCUTANEOUS | 11 refills | Status: DC

## 2017-10-13 NOTE — Progress Notes (Signed)
Subjective:    Patient ID: Cole [name], male    DOB: 1920-06-11, 83 y.o.   MRN: [id number]  HPI Pt returns for f/u of diabetes mellitus:  DM type: Insulin-requiring type 2 Dx'ed: 6599 Complications: polyneuropathy, CAD, PAD, foot ulcers, and renal failure  Therapy: insulin since mid-2018  DKA: never Severe hypoglycemia: never.  Pancreatitis: never Pancreatic imaging:  Other: he takes MDI; he lives at [facility], where staff gives insulin and checks cbg; history is mostly from son, due to memory loss.  Interval history: Our staff has called [facility], and obtained cbg record.  We have also requested med list, but it is not sent, so insulin dosages rx'ed at last ov are presumed to be current.  It varies from 66-300's.  It is in general higher as the day goes on.  Past Medical History:  Diagnosis Date  . Actinic keratitis   . CHF (congestive heart failure) ([place])   . Claudication ([place])   . Coronary artery disease    status post CABG, 1999  . Diabetes mellitus    Uncontrolled secondary to dietary noncompliance  . Diverticulosis   . DJD (degenerative joint disease)   . Hypertension    hypertensive syndrome with dyspnea -[facility], February, 2011 - EF 50% and cardiac bypass grafts all patent - responded to diuretics and blood pressure control - [name]  . Microscopic hematuria   . Onychomycosis   . Pneumonia 05/2017  . Prostate nodule   . PSVT (paroxysmal supraventricular tachycardia) ([place])   . Renal disorder   . UTI (urinary tract infection) 09/02/2017   sensitive to Ceftriaxone  . Vitamin B12 deficiency   . Vitamin D deficiency     Past Surgical History:  Procedure Laterality Date  . APPENDECTOMY    . CORONARY ARTERY BYPASS GRAFT      Social History   Socioeconomic History  . Marital status: Married    Spouse name: Not on file  . Number of children: Not on file  . Years of education: Not on file  . Highest education level: Not on  file  Social Needs  . Financial resource strain: Not on file  . Food insecurity - worry: Not on file  . Food insecurity - inability: Not on file  . Transportation needs - medical: Not on file  . Transportation needs - non-medical: Not on file  Occupational History  . Not on file  Tobacco Use  . Smoking status: Former Smoker    Types: Cigars  . Smokeless tobacco: Never Used  . Tobacco comment: rarely smoked  Substance and Sexual Activity  . Alcohol use: No  . Drug use: No  . Sexual activity: No  Other Topics Concern  . Not on file  Social History Narrative  . Not on file    Current Outpatient Medications on File Prior to Visit  Medication Sig Dispense Refill  . acetaminophen (TYLENOL) 325 MG tablet Take 650 mg by mouth every 6 (six) hours as needed for mild pain.    Marland Kitchen amLODipine (NORVASC) 5 MG tablet Take 5 mg by mouth daily.    Marland Kitchen aspirin EC 81 MG EC tablet Take 1 tablet (81 mg total) by mouth daily.    . bisacodyl (DULCOLAX) 10 MG suppository Place 10 mg rectally once as needed (FOR CONSTIPATION NOT RELIEVED BY MILK OF MAGNESIA).    Marland Kitchen cholecalciferol (VITAMIN D) 1000 units tablet Take 1,000 Units by mouth daily.    Marland Kitchen docusate  sodium (COLACE) 100 MG capsule Take 100 mg by mouth daily. HOLD FOR LOOSE STOOLS    . furosemide (LASIX) 80 MG tablet Take 1 tablet (80 mg total) by mouth daily. 30 tablet 2  . hydrALAZINE (APRESOLINE) 10 MG tablet Take 10 mg by mouth 2 (two) times daily.    Marland Kitchen ipratropium-albuterol (DUONEB) 0.5-2.5 (3) MG/3ML SOLN Take 3 mLs by nebulization every 6 (six) hours as needed.    . isosorbide mononitrate (IMDUR) 30 MG 24 hr tablet Take 30 mg by mouth daily.     Marland Kitchen latanoprost (XALATAN) 0.005 % ophthalmic solution Place 1 drop into both eyes every evening.     . loratadine (CLARITIN) 10 MG tablet Take 10 mg by mouth at bedtime.     . metoprolol (LOPRESSOR) 50 MG tablet TAKE 1 TABLET (50 MG TOTAL) BY MOUTH 2 (TWO) TIMES DAILY. 60 tablet 5  . Multiple Vitamin  (MULTIVITAMIN) tablet Take 1 tablet by mouth daily.    Marland Kitchen Neomycin-Bacitracin-Polymyxin (TRIPLE ANTIBIOTIC EX) Apply 1 application topically daily. Apply to right nostril    . nitroGLYCERIN (NITROSTAT) 0.4 MG SL tablet Place 0.4 mg under the tongue every 5 (five) minutes x 3 doses as needed for chest pain.     . pantoprazole (PROTONIX) 40 MG tablet Take 40 mg by mouth 2 (two) times daily.    . potassium chloride SA (K-DUR,KLOR-CON) 20 MEQ tablet Take 1 tablet (20 mEq total) by mouth 2 (two) times daily.    . rosuvastatin (CRESTOR) 10 MG tablet Take 10 mg by mouth. M-W-F    . sennosides-docusate sodium (SENOKOT-S) 8.6-50 MG tablet Take 2 tablets by mouth at bedtime.    . tamsulosin (FLOMAX) 0.4 MG CAPS capsule Take 0.4 mg by mouth at bedtime.     . vitamin B-12 (CYANOCOBALAMIN) 1000 MCG tablet Take 1,000 mcg by mouth daily.      No current facility-administered medications on file prior to visit.     No Known Allergies  Family History  Problem Relation Age of Onset  . Asthma Neg Hx   . COPD Neg Hx   . Diabetes Neg Hx     BP 122/62 (BP Location: Right Arm, Patient Position: Sitting, Cuff Size: Normal)   Pulse 75   Wt 192 lb 6.4 oz (87.3 kg)   SpO2 97%   BMI 29.25 kg/m    Review of Systems He denies hypoglycemia.      Objective:   Physical Exam Vital signs: see vs page Gen: elderly, frail, no distress Pulses: foot pulses are intact bilaterally.   MSK: no deformity of the feet or ankles.  CV: 1+ bilat edema of the legs Skin:  no ulcer on the feet or ankles.  normal color and temp on the feet and ankles.  Neuro: sensation is intact to touch on the feet and ankles.   Ext: There is bilateral onychomycosis of the toenails.    Lab Results  Component Value Date   HGBA1C 8.4 10/13/2017       Assessment & Plan:  Insulin-requiring type 2 DM, with PAD: The pattern of his cbg's indicates he needs some adjustment in his therapy Renal failure: in this setting, he may not need basal  insulin.  We'll adjust based on cbg's Frail elderly state: he is not a candidate for aggressive glycemic control.   Patient Instructions  check resident's blood sugar 4 times a day: before the 3 meals, and at bedtime.  also check if you have symptoms of your blood  sugar being too high or too low.  please keep a record of the readings and bring it to your next appointment here (or you can bring the meter itself).  You can write it on any piece of paper.  please call us sooner if your blood sugar goes below 70, or if you have a lot of readings over 200. Take novolog, 10 units 3 times a day (with each meal), no matter what cbg is, and: Reduce lantus to 14 units at bedtime.  Also, take extra novolog, 2 units for any cbg over 300.  Please come back for a follow-up appointment in 2-3 months.

## 2017-10-13 NOTE — Patient Instructions (Addendum)
check resident's blood sugar 4 times a day: before the 3 meals, and at bedtime.  also check if you have symptoms of your blood sugar being too high or too low.  please keep a record of the readings and bring it to your next appointment here (or you can bring the meter itself).  You can write it on any piece of paper.  please call us sooner if your blood sugar goes below 70, or if you have a lot of readings over 200. Take novolog, 10 units 3 times a day (with each meal), no matter what cbg is, and: Reduce lantus to 14 units at bedtime.  Also, take extra novolog, 2 units for any cbg over 300.  Please come back for a follow-up appointment in 2-3 months.

## 2017-10-20 ENCOUNTER — Non-Acute Institutional Stay (SKILLED_NURSING_FACILITY): Payer: Medicare HMO | Admitting: Adult Health

## 2017-10-20 ENCOUNTER — Encounter: Payer: Self-pay | Admitting: Adult Health

## 2017-10-20 DIAGNOSIS — I481 Persistent atrial fibrillation: Secondary | ICD-10-CM

## 2017-10-20 DIAGNOSIS — I70209 Unspecified atherosclerosis of native arteries of extremities, unspecified extremity: Secondary | ICD-10-CM | POA: Diagnosis not present

## 2017-10-20 DIAGNOSIS — E1151 Type 2 diabetes mellitus with diabetic peripheral angiopathy without gangrene: Secondary | ICD-10-CM | POA: Diagnosis not present

## 2017-10-20 DIAGNOSIS — K5901 Slow transit constipation: Secondary | ICD-10-CM | POA: Diagnosis not present

## 2017-10-20 DIAGNOSIS — I10 Essential (primary) hypertension: Secondary | ICD-10-CM | POA: Diagnosis not present

## 2017-10-20 DIAGNOSIS — N184 Chronic kidney disease, stage 4 (severe): Secondary | ICD-10-CM

## 2017-10-20 DIAGNOSIS — I5032 Chronic diastolic (congestive) heart failure: Secondary | ICD-10-CM

## 2017-10-20 DIAGNOSIS — I4819 Other persistent atrial fibrillation: Secondary | ICD-10-CM

## 2017-10-20 DIAGNOSIS — N400 Enlarged prostate without lower urinary tract symptoms: Secondary | ICD-10-CM | POA: Diagnosis not present

## 2017-10-20 DIAGNOSIS — I25810 Atherosclerosis of coronary artery bypass graft(s) without angina pectoris: Secondary | ICD-10-CM | POA: Diagnosis not present

## 2017-10-20 NOTE — Progress Notes (Signed)
Location:  [facility] Room Number: 323-A Place of Service:  SNF (31) Provider:  Durenda Age, NP  Patient Care Team: Josetta Huddle, MD as PCP - General (Internal Medicine) Medina-Vargas, Senaida Lange, NP as Nurse Practitioner (Internal Medicine)  Extended Emergency Contact Information Primary Emergency Contact: [name] [place] of [place] Phone: [phone] Mobile Phone: [phone] Relation: Son Secondary Emergency Contact: [name] [place] Phone: [phone] Mobile Phone: [phone] Relation: Daughter  Code Status:  Full Code Goals of care: Advanced Directive information Advanced Directives 07/20/2017  Does Patient Have a Medical Advance Directive? No  Type of Advance Directive -  Does patient want to make changes to medical advance directive? -  Copy of [place] in Chart? -  Would patient like information on creating a medical advance directive? No - Patient declined     Chief Complaint  Patient presents with  . Medical Management of Chronic Issues    Routine [facility] visit    HPI:  Pt is a 83 y.o. male seen today for medical management of chronic diseases.  He is a long-term care resident of [facility].  He has a PMH of HTN, DM with hyperlipidemia, CAD, and is status post CABG. He was seen in the room today. He complained of constipation. Noted consultation papers from recent endocrinology and cardiology clinic appointment. He did not give papers to the charge nurse.     Past Medical History:  Diagnosis Date  . Actinic keratitis   . CHF (congestive heart failure) ([place])   . Claudication ([place])   . Coronary artery disease    status post CABG, 1999  . Diabetes mellitus    Uncontrolled secondary to dietary noncompliance  . Diverticulosis   . DJD (degenerative joint disease)   . Hypertension    hypertensive syndrome with dyspnea -[facility], February, 2011 - EF 50% and cardiac bypass grafts all patent - responded to diuretics and blood pressure control - [name]  . Microscopic hematuria   . Onychomycosis   . Pneumonia 05/2017  . Prostate nodule   . PSVT (paroxysmal supraventricular tachycardia) ([place])   . Renal disorder   . UTI (urinary tract infection) 09/02/2017   sensitive to Ceftriaxone  . Vitamin B12 deficiency   . Vitamin D deficiency    Past Surgical History:  Procedure Laterality Date  . APPENDECTOMY    . CORONARY ARTERY BYPASS GRAFT      No Known Allergies  Outpatient Encounter Medications as of 10/20/2017  Medication Sig  . acetaminophen (TYLENOL) 325 MG tablet Take 650 mg by mouth every 6 (six) hours as needed for mild pain.  Marland Kitchen amLODipine (NORVASC) 5 MG tablet Take 5 mg by mouth daily.  Marland Kitchen aspirin EC 81 MG EC tablet Take 1 tablet (81 mg total) by mouth daily.  . bisacodyl (DULCOLAX) 10 MG suppository Place 10 mg rectally once as needed (FOR CONSTIPATION NOT RELIEVED BY MILK OF MAGNESIA).  Marland Kitchen cholecalciferol (VITAMIN D) 1000 units tablet Take 1,000 Units by mouth daily.  Marland Kitchen docusate sodium (COLACE) 100 MG capsule Take 100 mg by mouth daily. HOLD FOR LOOSE STOOLS  . furosemide (LASIX) 80 MG tablet Take 1 tablet (80 mg total) by mouth daily.  . hydrALAZINE (APRESOLINE) 10 MG tablet Take 10 mg by mouth 2 (two) times daily.  . insulin aspart (NOVOLOG) 100 UNIT/ML injection Inject 8 Units into the skin 3 (three) times daily before meals.  Does not matter what the CBG is.  Inject another 3 units if CBG over 300.  Marland Kitchen insulin glargine (LANTUS) 100 UNIT/ML injection Inject 20 Units into the skin at bedtime.  Marland Kitchen ipratropium-albuterol (DUONEB) 0.5-2.5 (3) MG/3ML SOLN Take 3 mLs by nebulization every 6 (six) hours as needed.  . isosorbide mononitrate (IMDUR) 30 MG 24 hr tablet Take 30 mg by mouth daily.   Marland Kitchen latanoprost (XALATAN) 0.005 % ophthalmic solution Place 1 drop into both eyes every evening.   .  metoprolol (LOPRESSOR) 50 MG tablet TAKE 1 TABLET (50 MG TOTAL) BY MOUTH 2 (TWO) TIMES DAILY.  . Multiple Vitamin (MULTIVITAMIN) tablet Take 1 tablet by mouth daily.  Marland Kitchen Neomycin-Bacitracin-Polymyxin (TRIPLE ANTIBIOTIC EX) Apply 1 application topically daily. Apply to right nostril  . nitroGLYCERIN (NITROSTAT) 0.4 MG SL tablet Place 0.4 mg under the tongue every 5 (five) minutes x 3 doses as needed for chest pain.   . pantoprazole (PROTONIX) 40 MG tablet Take 40 mg by mouth 2 (two) times daily.  . potassium chloride SA (K-DUR,KLOR-CON) 20 MEQ tablet Take 1 tablet (20 mEq total) by mouth 2 (two) times daily.  . rosuvastatin (CRESTOR) 10 MG tablet Take 10 mg by mouth. M-W-F  . sennosides-docusate sodium (SENOKOT-S) 8.6-50 MG tablet Take 2 tablets by mouth at bedtime.  . tamsulosin (FLOMAX) 0.4 MG CAPS capsule Take 0.4 mg by mouth at bedtime.   . vitamin B-12 (CYANOCOBALAMIN) 1000 MCG tablet Take 1,000 mcg by mouth daily.   . [DISCONTINUED] insulin aspart (NOVOLOG) 100 UNIT/ML injection Inject 10 Units into the skin 3 (three) times daily before meals. additional 2 units if CBG over 300.  . [DISCONTINUED] Insulin Glargine (LANTUS SOLOSTAR) 100 UNIT/ML Solostar Pen Inject 14 Units into the skin at bedtime.  . [DISCONTINUED] loratadine (CLARITIN) 10 MG tablet Take 10 mg by mouth at bedtime.    No facility-administered encounter medications on file as of 10/20/2017.     Review of Systems  GENERAL: No change in appetite, no fatigue, no weight changes, no fever, chills or weakness MOUTH and THROAT: Denies oral discomfort, gingival pain or bleeding RESPIRATORY: no cough, SOB, DOE, wheezing, hemoptysis CARDIAC: No chest pain, or palpitations GI: No abdominal pain, diarrhea, heart burn, nausea or vomiting, +constipation GU: Denies dysuria, frequency, hematuria, incontinence, or discharge PSYCHIATRIC: Denies feelings of depression or anxiety. No report of hallucinations, insomnia, paranoia, or  agitation   Immunization History  Administered Date(s) Administered  . Influenza, High Dose Seasonal PF 08/06/2015  . Influenza-Unspecified 08/06/2015, 09/03/2016, 09/02/2017  . PPD Test 09/20/2015  . Pneumococcal Polysaccharide-23 08/06/2015  . Pneumococcal-Unspecified 09/03/2016  . Tdap 07/28/2015   Pertinent  Health Maintenance Due  Topic Date Due  . OPHTHALMOLOGY EXAM  09/28/1930  . PNA vac Low Risk Adult (2 of 2 - PCV13) 09/03/2017  . URINE MICROALBUMIN  11/17/2023 (Originally 12/10/2016)  . HEMOGLOBIN A1C  04/12/2018  . FOOT EXAM  10/13/2018  . INFLUENZA VACCINE  Completed   Fall Risk  07/20/2017 01/05/2017 09/09/2016 08/24/2016 08/07/2016  Falls in the past year? No No No No No      Vitals:   10/20/17 1236  BP: 120/60  Pulse: 68  Resp: 20  Temp: (!) 97.3 F (36.3 C)  TempSrc: Oral  SpO2: 99%  Weight: 187 lb 12.8 oz (85.2 kg)  Height: 5\' 8"  (1.727 m)   Body mass index is 28.55 kg/m.   Physical Exam  GENERAL APPEARANCE: Well nourished. In no acute distress. Normal body habitus SKIN:  Skin  is warm and dry.  MOUTH and THROAT: Lips are without lesions. Oral mucosa is moist and without lesions. Tongue is normal in shape, size, and color and without lesions RESPIRATORY: Breathing is even & unlabored, BS CTAB CARDIAC: RRR, no murmur,no extra heart sounds, BLE 2+ edema GI: Abdomen soft, normal BS, no masses, no tenderness EXTREMITIES:  Able to move X 4 extremities PSYCHIATRIC: Alert and oriented X 3. Affect and behavior are appropriate   Labs reviewed: Recent Labs    06/11/17 1907  06/23/17 0614 06/23/17 1441  06/25/17 0315 06/26/17 0416  09/01/17 09/10/17 10/11/17 1520  NA 127*   < >  --  138   < > 138 139   < > 140 136* 139  K 5.0   < >  --  4.0   < > 3.6 3.8   < > 4.8 4.8 4.7  CL 93*   < >  --  106   < > 105 104  --   --   --  100  CO2 22   < >  --  21*   < > 24 24  --   --   --  20  GLUCOSE 275*   < >  --  195*   < > 139* 105*  --   --   --  225*  BUN  127*   < >  --  41*   < > 30* 29*   < > 60* 54* 51*  CREATININE 4.14*   < >  --  2.11*   < > 1.87* 1.86*   < > 2.6* 1.9* 2.07*  CALCIUM 8.9   < >  --  8.6*   < > 8.7* 8.8*  --   --   --  9.0  MG 1.7  --  1.9 1.8  --   --   --   --   --   --   --   PHOS  --   --  4.0  --   --  3.4 3.8  --   --   --   --    < > = values in this interval not displayed.   Recent Labs    06/22/17 1608 06/23/17 0108 06/23/17 1441 06/25/17 0315 06/26/17 0416 07/01/17  AST 27 23 22   --   --  13*  ALT 26 23 25   --   --  19  ALKPHOS 70 65 72  --   --  67  BILITOT 1.4* 1.5* 1.3*  --   --   --   PROT 7.0 6.4* 6.6  --   --   --   ALBUMIN 3.2* 3.0* 2.9* 3.0* 2.9*  --    Recent Labs    06/11/17 1907  06/23/17 1441  06/25/17 0315 06/26/17 0416 07/01/17 09/01/17 10/11/17 1520  WBC 8.9   < > 9.5   < > 6.0 6.9 8.6 9.6 8.4  NEUTROABS 6.3  --  7.3  --   --   --  6  --   --   HGB 11.4*   < > 9.4*   < > 9.8* 9.6* 10.7* 9.4* 10.5*  HCT 33.7*   < > 28.1*   < > 29.3* 29.1* 32* 28* 32.3*  MCV 86.6   < > 87.5   < > 86.2 86.4  --   --  90  PLT 183   < > 177   < > 217 237 262 211  229   < > = values in this interval not displayed.   Lab Results  Component Value Date   TSH 1.266 06/23/2017   Lab Results  Component Value Date   HGBA1C 8.4 10/13/2017   Lab Results  Component Value Date   CHOL 181 06/19/2017   HDL 49 06/19/2017   LDLCALC 117 06/19/2017   TRIG 75 06/19/2017   CHOLHDL 2.5 01/01/2010    Assessment/Plan  1. 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 Loanne Drilling, but resident has not been giving the papers to the charge nurses, papers were seen at bedside and shown to me, noted new orders as follows: to decrease Lantus from 20 units to 14 units Q HS and Novolog 10 units TID with meals and additional 2 units for CBG >300 Lab Results  Component Value Date   HGBA1C 8.4 10/13/2017     2. Chronic kidney disease, stage 4 (severe) (HCC)  - will monitor Lab Results  Component Value Date   CREATININE 2.07 (H) 10/11/2017     3. Essential hypertension - well-controlled, continue amlodipine 5 mg 1 tab daily, metoprolol 50 mg 1 tab twice a day   4. Persistent atrial fibrillation (HCC) - 8 controlled, continue metoprolol tartrate 50 mg 1 tab twice a day and aspirin 81 mg 1 tab daily   5. Chronic diastolic CHF (congestive heart failure) (HCC) - no SOB, continue Lasix 80 mg 1 tab daily and KCl ER 20 meq twice a day, metoprolol 50 mg 1 tab twice a day   6. Benign prostatic hyperplasia, unspecified whether lower urinary tract symptoms present -  continue tamsulosin 0.4 mg 1 capsule daily at bedtime   7. Coronary artery disease involving coronary bypass graft of native heart, angina presence unspecified - stable, continue Aspirin 81 mg daily, isosorbide mononitrate ER 30 mg 1 tab daily and NTG when necessary   8. Slow transit constipation - will increase senna S 2 tabs by mouth twice a day and discontinue Colace      Family/ staff Communication: Discussed plan of care with resident and charge nurse.  Labs/tests ordered:  None    Goals of care:  Long-term care    Durenda Age, NP [facility] [phone]

## 2017-11-04 ENCOUNTER — Encounter: Payer: Self-pay | Admitting: Podiatry

## 2017-11-04 ENCOUNTER — Ambulatory Visit (INDEPENDENT_AMBULATORY_CARE_PROVIDER_SITE_OTHER): Payer: Medicare HMO | Admitting: Podiatry

## 2017-11-04 DIAGNOSIS — B351 Tinea unguium: Secondary | ICD-10-CM | POA: Diagnosis not present

## 2017-11-04 DIAGNOSIS — E1151 Type 2 diabetes mellitus with diabetic peripheral angiopathy without gangrene: Secondary | ICD-10-CM | POA: Diagnosis not present

## 2017-11-04 NOTE — Progress Notes (Signed)
Subjective:  Patient ID: Cole [name], male    DOB: May 28, 1920,  MRN: [id number]  Chief Complaint  Patient presents with  . Nail Problem    bilateral elongated thickened toenails. Patient was attempting to trim his toenails himself and cut one too close.  . Diabetes  . Callouses    bilateral   83 y.o. male presents with the above complaint.  Reports painfully elongated nails to both feet for which she has difficulty cutting them himself.  Endorses diabetes did not check his sugars this morning.  Past Medical History:  Diagnosis Date  . Actinic keratitis   . CHF (congestive heart failure) ([place])   . Claudication ([place])   . Coronary artery disease    status post CABG, 1999  . Diabetes mellitus    Uncontrolled secondary to dietary noncompliance  . Diverticulosis   . DJD (degenerative joint disease)   . Hypertension    hypertensive syndrome with dyspnea -[facility], February, 2011 - EF 50% and cardiac bypass grafts all patent - responded to diuretics and blood pressure control - [name]  . Microscopic hematuria   . Onychomycosis   . Pneumonia 05/2017  . Prostate nodule   . PSVT (paroxysmal supraventricular tachycardia) ([place])   . Renal disorder   . UTI (urinary tract infection) 09/02/2017   sensitive to Ceftriaxone  . Vitamin B12 deficiency   . Vitamin D deficiency    Past Surgical History:  Procedure Laterality Date  . APPENDECTOMY    . CORONARY ARTERY BYPASS GRAFT      Current Outpatient Medications:  .  acetaminophen (TYLENOL) 325 MG tablet, Take 650 mg by mouth every 6 (six) hours as needed for mild pain., Disp: , Rfl:  .  amLODipine (NORVASC) 5 MG tablet, Take 5 mg by mouth daily., Disp: , Rfl:  .  aspirin EC 81 MG EC tablet, Take 1 tablet (81 mg total) by mouth daily., Disp: , Rfl:  .  bisacodyl (DULCOLAX) 10 MG suppository, Place 10 mg rectally once as needed (FOR CONSTIPATION NOT RELIEVED BY MILK OF MAGNESIA)., Disp: , Rfl:  .   cholecalciferol (VITAMIN D) 1000 units tablet, Take 1,000 Units by mouth daily., Disp: , Rfl:  .  docusate sodium (COLACE) 100 MG capsule, Take 100 mg by mouth daily. HOLD FOR LOOSE STOOLS, Disp: , Rfl:  .  furosemide (LASIX) 80 MG tablet, Take 1 tablet (80 mg total) by mouth daily., Disp: 30 tablet, Rfl: 2 .  hydrALAZINE (APRESOLINE) 10 MG tablet, Take 10 mg by mouth 2 (two) times daily., Disp: , Rfl:  .  insulin aspart (NOVOLOG) 100 UNIT/ML injection, Inject 8 Units into the skin 3 (three) times daily before meals. Does not matter what the CBG is.  Inject another 3 units if CBG over 300., Disp: , Rfl:  .  insulin glargine (LANTUS) 100 UNIT/ML injection, Inject 20 Units into the skin at bedtime., Disp: , Rfl:  .  ipratropium-albuterol (DUONEB) 0.5-2.5 (3) MG/3ML SOLN, Take 3 mLs by nebulization every 6 (six) hours as needed., Disp: , Rfl:  .  isosorbide mononitrate (IMDUR) 30 MG 24 hr tablet, Take 30 mg by mouth daily. , Disp: , Rfl:  .  latanoprost (XALATAN) 0.005 % ophthalmic solution, Place 1 drop into both eyes every evening. , Disp: , Rfl:  .  metoprolol (LOPRESSOR) 50 MG tablet, TAKE 1 TABLET (50 MG TOTAL) BY MOUTH 2 (TWO) TIMES DAILY., Disp: 60 tablet, Rfl: 5 .  Multiple Vitamin (MULTIVITAMIN) tablet,  Take 1 tablet by mouth daily., Disp: , Rfl:  .  Neomycin-Bacitracin-Polymyxin (TRIPLE ANTIBIOTIC EX), Apply 1 application topically daily. Apply to right nostril, Disp: , Rfl:  .  nitroGLYCERIN (NITROSTAT) 0.4 MG SL tablet, Place 0.4 mg under the tongue every 5 (five) minutes x 3 doses as needed for chest pain. , Disp: , Rfl:  .  pantoprazole (PROTONIX) 40 MG tablet, Take 40 mg by mouth 2 (two) times daily., Disp: , Rfl:  .  potassium chloride SA (K-DUR,KLOR-CON) 20 MEQ tablet, Take 1 tablet (20 mEq total) by mouth 2 (two) times daily., Disp: , Rfl:  .  rosuvastatin (CRESTOR) 10 MG tablet, Take 10 mg by mouth. M-W-F, Disp: , Rfl:  .  sennosides-docusate sodium (SENOKOT-S) 8.6-50 MG tablet, Take  2 tablets by mouth at bedtime., Disp: , Rfl:  .  tamsulosin (FLOMAX) 0.4 MG CAPS capsule, Take 0.4 mg by mouth at bedtime. , Disp: , Rfl:  .  vitamin B-12 (CYANOCOBALAMIN) 1000 MCG tablet, Take 1,000 mcg by mouth daily. , Disp: , Rfl:   No Known Allergies Review of Systems Objective:  There were no vitals filed for this visit. General AA&O x3. Normal mood and affect.  Vascular Dorsalis pedis and posterior tibial pulses  present 2+ and absent bilaterally  Capillary refill normal to all digits. Pedal hair growth diminished.  Neurologic Epicritic sensation grossly present.  Dermatologic No open lesions. Interspaces clear of maceration. Nails well groomed and normal in appearance.  Orthopedic: MMT 5/5 in dorsiflexion, plantarflexion, inversion, and eversion. Normal joint ROM without pain or crepitus.   Assessment & Plan:  Patient was evaluated and treated and all questions answered.  Diabetes with PAD, Onychomycosis -Educated on diabetic footcare. Diabetic risk level 1 -Nails x10 debrided sharply and manually with large nail nipper and rotary burr.  Procedure: Nail Debridement Rationale: Patient meets criteria for routine foot care due to Class B findings Type of Debridement: manual, sharp debridement. Instrumentation: Nail nipper, rotary burr. Number of Nails: 10    Return in about 3 months (around 02/02/2018).

## 2017-11-18 ENCOUNTER — Non-Acute Institutional Stay (SKILLED_NURSING_FACILITY): Payer: Medicare HMO | Admitting: Internal Medicine

## 2017-11-18 ENCOUNTER — Encounter: Payer: Self-pay | Admitting: Internal Medicine

## 2017-11-18 DIAGNOSIS — I5042 Chronic combined systolic (congestive) and diastolic (congestive) heart failure: Secondary | ICD-10-CM

## 2017-11-18 DIAGNOSIS — I70209 Unspecified atherosclerosis of native arteries of extremities, unspecified extremity: Secondary | ICD-10-CM | POA: Diagnosis not present

## 2017-11-18 DIAGNOSIS — N183 Chronic kidney disease, stage 3 unspecified: Secondary | ICD-10-CM

## 2017-11-18 DIAGNOSIS — E1151 Type 2 diabetes mellitus with diabetic peripheral angiopathy without gangrene: Secondary | ICD-10-CM | POA: Diagnosis not present

## 2017-11-18 DIAGNOSIS — I4819 Other persistent atrial fibrillation: Secondary | ICD-10-CM

## 2017-11-18 DIAGNOSIS — Z9119 Patient's noncompliance with other medical treatment and regimen: Secondary | ICD-10-CM | POA: Diagnosis not present

## 2017-11-18 DIAGNOSIS — Z91199 Patient's noncompliance with other medical treatment and regimen due to unspecified reason: Secondary | ICD-10-CM

## 2017-11-18 DIAGNOSIS — I481 Persistent atrial fibrillation: Secondary | ICD-10-CM | POA: Diagnosis not present

## 2017-11-18 NOTE — Assessment & Plan Note (Signed)
Glucoses continue to be markedly variable with lows of 135 fasting and post meal up to 383. As noted dietary noncompliance is a majorcontributing factor At his age and with his multiple comorbidities, A1c goal near 8% would be adequate

## 2017-11-18 NOTE — Assessment & Plan Note (Addendum)
11/18/17 clinically his chronic heart failure is well compensated at this time, weights will be continued to be monitored. Today he is at his baseline weight. No change in his present furosemide dose

## 2017-11-18 NOTE — Assessment & Plan Note (Signed)
10/11/17 creatinine had risen slightly to 2.07 Recheck BMET

## 2017-11-18 NOTE — Patient Instructions (Signed)
See assessment and plan under each diagnosis in the problem list and acutely for this visit 

## 2017-11-18 NOTE — Progress Notes (Signed)
NURSING HOME LOCATION:  [facility] ROOM NUMBER:  [id number]  CODE STATUS:  Full Code  PCP:  [name], MD  [address] [place] [address]  This is a nursing facility follow up for specific acute issue of chronic medical diagnoses  Interim medical record and care since last [place] visit was updated with review of diagnostic studies and change in clinical status since last visit were documented.  HPI: He was seen by Dr. Loanne Drilling in follow-up of his diabetes complicated by polyneuropathy, chronic kidney disease, peripheral vascular disease, and foot ulcers. He has been on insulin since mid 2018. He's never been in DKA. Also never had severe hypoglycemia. Dr. Loanne Drilling documented that glucoses have ranged from a low of 66 up to 300s. Higher values are typically later in the day. Lack of control was attributed to his dietary noncompliance. He recommended checking glucoses before each meal and at bedtime. He was to be notified if glucoses were less than 70 or over 200. He was to take NovoLog 10 units before each meal regardless of what the glucose was. His Lantus was reduced to 14 units at bedtime. An additional 2 units of NovoLog was recommended for any glucose over 300. His A1c is current with a value of 8.4% on 10/13/17. Cardiology follow-up was 10/11/17. His diuretics have been adjusted based on his degree of azotemia. Edema has been variable, worse with  dependency of the lower extremities and possible sodium ingestion at bedside. Weight was noted to be increased and was associated with worsening dyspnea. Labs done at that visit revealed a glucose of 225, BUN 51, creatinine 2.07 (up from a value 1.9) BUN 25, GFR 26, hemoglobin 10.5, hematocrit 32.3. Indices were normochromic, normocytic. Dr. Crisoforo Oxford was to follow him for his chronic kidney disease. He has history of vitamin B12 deficiency, but B12 level was normal twice in the last year.  Review of systems: He  states his only active symptoms at this time are the edema with leg dependency for prolonged period time and urinary frequency when he takes the Lasix. Here at the SNF weights ranged from 192 up to a maximum of 197.2. His weight today was 192.6.  Constitutional: No fever,significant weight change, fatigue  Eyes: No redness, discharge, pain, vision change ENT/mouth: No nasal congestion,  purulent discharge, earache,change in hearing ,sore throat  Cardiovascular: No chest pain, palpitations,paroxysmal nocturnal dyspnea, claudication  Respiratory: No cough, sputum production,hemoptysis, DOE , significant snoring,apnea  Gastrointestinal: No heartburn,dysphagia,abdominal pain, nausea / vomiting,rectal bleeding, melena,change in bowels Genitourinary: No dysuria,hematuria, pyuria,  incontinence, nocturia Musculoskeletal: No joint stiffness, joint swelling, weakness,pain Dermatologic: No rash, pruritus, change in appearance of skin Neurologic: No dizziness,headache,syncope, seizures, numbness , tingling Psychiatric: No significant anxiety , depression, insomnia, anorexia Endocrine: No change in hair/skin/ nails, excessive thirst, excessive hunger Hematologic/lymphatic: No significant bruising, lymphadenopathy,abnormal bleeding Allergy/immunology: No itchy/ watery eyes, significant sneezing, urticaria, angioedema  Physical exam:  Pertinent or positive findings: Appears younger than his stated age of 57. He has a Actuary Manuela Schwartz containing multiple spices and salt as well as a separate salt container and a bag of candy @ bedside. He has dense arcus senilis. He is edentulous except for multiple carious remnants of teeth in the anterior mandible. Heart sounds are markedly distant, but clinically rhythm appears regular. There is a slight increase in the second heart sound. Abdomen is massive and distended. There is no tenderness or organomegaly. He has 1+ edema of the ankles. Pedal  pulses are decreased.  There is deformity of the nails of the left hand. He also has DIP deformities of the left hand.  General appearance:Adequately nourished; no acute distress , increased work of breathing is present.   Lymphatic: No lymphadenopathy about the head, neck, axilla . Eyes: No conjunctival inflammation or lid edema is present. There is no scleral icterus. Ears:  External ear exam shows no significant lesions or deformities.   Nose:  External nasal examination shows no deformity or inflammation. Nasal mucosa are pink and moist without lesions, exudates Oral exam: lips and gums are healthy appearing.There is no oropharyngeal erythema or exudate . Neck:  No thyromegaly, masses, tenderness noted.    Heart:  No gallop, murmur, click, rub.  Lungs: without wheezes, rhonchi, rales, rubs. Abdomen: Bowel sounds are normal. Abdomen is soft and nontender with no organomegaly, hernias,masses. GU: deferred  Extremities:  No cyanosis, clubbing  Neurologic exam : Strength equal in upper & lower extremities Balance, [place], finger to nose testing could not be completed due to clinical state  Skin: Warm & dry w/o tenting. No significant  rash.  See summary under each active problem in the Problem List with associated updated therapeutic plan.

## 2017-11-18 NOTE — Assessment & Plan Note (Signed)
11/18/17 clinically his rhythm is essentially regular today. The most recent EKG in Epic 06/23/17 which was reviewed revealed first degree block with frequent blocked PACs. Even were he in atrial fib or flutter rate is well controlled

## 2017-11-18 NOTE — Assessment & Plan Note (Signed)
As per SNF policy as long as patient is mentally competent (as he is) there is no strict enforcement of diet and medication compliance

## 2017-11-19 DIAGNOSIS — N390 Urinary tract infection, site not specified: Secondary | ICD-10-CM | POA: Diagnosis not present

## 2017-11-19 DIAGNOSIS — D649 Anemia, unspecified: Secondary | ICD-10-CM | POA: Diagnosis not present

## 2017-11-19 DIAGNOSIS — E119 Type 2 diabetes mellitus without complications: Secondary | ICD-10-CM | POA: Diagnosis not present

## 2017-11-19 LAB — BASIC METABOLIC PANEL
BUN: 57 — AB (ref 4–21)
CREATININE: 2 — AB (ref 0.6–1.3)
Glucose: 102
POTASSIUM: 4.3 (ref 3.4–5.3)
Sodium: 139 (ref 137–147)
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
## 2017-11-24 ENCOUNTER — Encounter: Payer: Self-pay | Admitting: Nurse Practitioner

## 2017-11-24 ENCOUNTER — Ambulatory Visit: Payer: Medicare HMO | Admitting: Nurse Practitioner

## 2017-11-24 VITALS — BP 140/70 | HR 71 | Ht 68.0 in | Wt 200.0 lb

## 2017-11-24 DIAGNOSIS — I481 Persistent atrial fibrillation: Secondary | ICD-10-CM | POA: Diagnosis not present

## 2017-11-24 DIAGNOSIS — I4819 Other persistent atrial fibrillation: Secondary | ICD-10-CM

## 2017-11-24 DIAGNOSIS — I2510 Atherosclerotic heart disease of native coronary artery without angina pectoris: Secondary | ICD-10-CM

## 2017-11-24 DIAGNOSIS — I5042 Chronic combined systolic (congestive) and diastolic (congestive) heart failure: Secondary | ICD-10-CM

## 2017-11-24 NOTE — Patient Instructions (Addendum)
We will be checking the following labs today - NONE  Medication Instructions:    Continue with your current medicines.     Testing/Procedures To Be Arranged:  N/A  Follow-Up:   See Dr. Tamala Julian in about 3 months    Other Special Instructions:   N/A    If you need a refill on your cardiac medications before your next appointment, please call your pharmacy.   Call the [place] office at [phone] if you have any questions, problems or concerns.

## 2017-12-17 ENCOUNTER — Ambulatory Visit (INDEPENDENT_AMBULATORY_CARE_PROVIDER_SITE_OTHER): Payer: Medicare HMO | Admitting: Endocrinology

## 2017-12-17 VITALS — BP 138/68 | HR 68 | Ht 68.0 in | Wt 192.0 lb

## 2017-12-17 DIAGNOSIS — I70209 Unspecified atherosclerosis of native arteries of extremities, unspecified extremity: Secondary | ICD-10-CM | POA: Diagnosis not present

## 2017-12-17 DIAGNOSIS — E1151 Type 2 diabetes mellitus with diabetic peripheral angiopathy without gangrene: Secondary | ICD-10-CM | POA: Diagnosis not present

## 2017-12-17 LAB — POCT GLYCOSYLATED HEMOGLOBIN (HGB A1C): HEMOGLOBIN A1C: 8.4

## 2017-12-17 MED ORDER — INSULIN ASPART 100 UNIT/ML ~~LOC~~ SOLN: 12.0000 [IU] | Freq: Three times a day (TID) | SUBCUTANEOUS | 11 refills | Status: DC

## 2017-12-17 MED ORDER — INSULIN GLARGINE 100 UNIT/ML ~~LOC~~ SOLN: 8.0000 [IU] | Freq: Every day | SUBCUTANEOUS | 11 refills | Status: DC

## 2017-12-17 NOTE — Patient Instructions (Addendum)
check resident's blood sugar 4 times a day: before the 3 meals, and at bedtime.  also check if you have symptoms of your blood sugar being too high or too low.  please keep a record of the readings and bring it to your next appointment here (or you can bring the meter itself).  You can write it on any piece of paper.  please call us sooner if your blood sugar goes below 70, or if most cbg's are over 200.  Take novolog, 12 units 3 times a day (with each meal), no matter what cbg is, and: Reduce lantus to 8 units at bedtime.  Also, take extra novolog, 2 units for any cbg over 300.  Please come back for a follow-up appointment in 2-3 months.

## 2017-12-17 NOTE — Progress Notes (Signed)
Subjective:    Patient ID: Cole [name], male    DOB: [birth year], 84 y.o.   MRN: [id number]  HPI Pt returns for f/u of diabetes mellitus:  DM type: Insulin-requiring type 2 Dx'ed: 3154 Complications: polyneuropathy, CAD, PAD, foot ulcers, and renal failure  Therapy: insulin since mid-2018  DKA: never Severe hypoglycemia: never.  Pancreatitis: never Pancreatic imaging: not mentioned on 2017 [place] Other: he takes MDI; he lives at [facility], where staff gives insulin and checks cbg; history is mostly from son, due to memory loss.  Interval history: Our staff has called [facility], and obtained cbg record.  It varies from 142-300's.  It is in general higher as the day goes on.  pt states he feels well in general. Past Medical History:  Diagnosis Date  . Actinic keratitis   . CHF (congestive heart failure) ([place])   . Claudication ([place])   . Coronary artery disease    status post CABG, 1999  . Diabetes mellitus    Uncontrolled secondary to dietary noncompliance  . Diverticulosis   . DJD (degenerative joint disease)   . Hypertension    hypertensive syndrome with dyspnea -[facility], February, [facility] - EF 50% and cardiac bypass grafts all patent - responded to diuretics and blood pressure control - [name]  . Microscopic hematuria   . Onychomycosis   . Pneumonia 05/2017  . Prostate nodule   . PSVT (paroxysmal supraventricular tachycardia) ([place])   . Renal disorder   . UTI (urinary tract infection) 09/02/2017   sensitive to Ceftriaxone  . Vitamin B12 deficiency   . Vitamin D deficiency     Past Surgical History:  Procedure Laterality Date  . APPENDECTOMY    . CORONARY ARTERY BYPASS GRAFT      Social History   Socioeconomic History  . Marital status: Married    Spouse name: Not on file  . Number of children: Not on file  . Years of education: Not on file  . Highest education level: Not on file  Social Needs  . Financial resource strain: Not on  file  . Food insecurity - worry: Not on file  . Food insecurity - inability: Not on file  . Transportation needs - medical: Not on file  . Transportation needs - non-medical: Not on file  Occupational History  . Not on file  Tobacco Use  . Smoking status: Former Smoker    Types: Cigars  . Smokeless tobacco: Never Used  . Tobacco comment: rarely smoked  Substance and Sexual Activity  . Alcohol use: No  . Drug use: No  . Sexual activity: No  Other Topics Concern  . Not on file  Social History Narrative  . Not on file    Current Outpatient Medications on File Prior to Visit  Medication Sig Dispense Refill  . acetaminophen (TYLENOL) 325 MG tablet Take 650 mg by mouth every 6 (six) hours as needed for mild pain.    Marland Kitchen amLODipine (NORVASC) 5 MG tablet Take 5 mg by mouth daily.    Marland Kitchen aspirin EC 81 MG EC tablet Take 1 tablet (81 mg total) by mouth daily.    . bisacodyl (DULCOLAX) 10 MG suppository Place 10 mg rectally once as needed (FOR CONSTIPATION NOT RELIEVED BY MILK OF MAGNESIA).    Marland Kitchen cholecalciferol (VITAMIN D) 1000 units tablet Take 1,000 Units by mouth daily.    . furosemide (LASIX) 80 MG tablet Take 1 tablet (80 mg total) by mouth daily.  30 tablet 2  . hydrALAZINE (APRESOLINE) 10 MG tablet Take 10 mg by mouth 2 (two) times daily.    Marland Kitchen ipratropium-albuterol (DUONEB) 0.5-2.5 (3) MG/3ML SOLN Take 3 mLs by nebulization every 6 (six) hours as needed.    . isosorbide mononitrate (IMDUR) 30 MG 24 hr tablet Take 30 mg by mouth daily.     Marland Kitchen latanoprost (XALATAN) 0.005 % ophthalmic solution Place 1 drop into both eyes every evening.     . magnesium hydroxide (MILK OF MAGNESIA) 400 MG/5ML suspension Take 5 mLs by mouth daily as needed for mild constipation.    . metoprolol (LOPRESSOR) 50 MG tablet TAKE 1 TABLET (50 MG TOTAL) BY MOUTH 2 (TWO) TIMES DAILY. 60 tablet 5  . Multiple Vitamin (MULTIVITAMIN) tablet Take 1 tablet by mouth daily.    Marland Kitchen Neomycin-Bacitracin-Polymyxin (TRIPLE ANTIBIOTIC  EX) Apply 1 application topically daily. Apply to right nostril    . nitroGLYCERIN (NITROSTAT) 0.4 MG SL tablet Place 0.4 mg under the tongue every 5 (five) minutes x 3 doses as needed for chest pain.     . pantoprazole (PROTONIX) 40 MG tablet Take 40 mg by mouth 2 (two) times daily.    . potassium chloride SA (K-DUR,KLOR-CON) 20 MEQ tablet Take 1 tablet (20 mEq total) by mouth 2 (two) times daily.    . rosuvastatin (CRESTOR) 10 MG tablet Take 10 mg by mouth. M-W-F    . sennosides-docusate sodium (SENOKOT-S) 8.6-50 MG tablet Take 2 tablets by mouth at bedtime.     . tamsulosin (FLOMAX) 0.4 MG CAPS capsule Take 0.4 mg by mouth at bedtime.     . vitamin B-12 (CYANOCOBALAMIN) 1000 MCG tablet Take 1,000 mcg by mouth daily.      No current facility-administered medications on file prior to visit.     No Known Allergies  Family History  Problem Relation Age of Onset  . Asthma Neg Hx   . COPD Neg Hx   . Diabetes Neg Hx     BP 138/68 (BP Location: Left Arm, Patient Position: Sitting, Cuff Size: Normal)   Pulse 68   Ht 5\' 8"  (1.727 m)   Wt 192 lb (87.1 kg) Comment: per patient; wheelchair  SpO2 98%   BMI 29.19 kg/m    Review of Systems He denies hypoglycemia    Objective:   Physical Exam Vital signs: see vs page Gen: elderly, frail, no distress.  In wheelchair Pulses: foot pulses are intact bilaterally.   MSK: no deformity of the feet or ankles.  CV: 2+ bilat edema of the legs Skin:  no ulcer on the feet or ankles.  normal color and temp on the feet and ankles.  Old healed surgical scar (vein harvest) at the right leg Neuro: sensation is intact to touch on the feet and ankles.   Ext: There is bilateral onychomycosis of the toenails.    Lab Results  Component Value Date   HGBA1C 8.4 12/17/2017   Lab Results  Component Value Date   CREATININE 2.0 (A) 11/19/2017   BUN 57 (A) 11/19/2017   NA 139 11/19/2017   K 4.3 11/19/2017   CL 100 10/11/2017   CO2 20 10/11/2017        Assessment & Plan:  Insulin-requiring type 2 DM, with PAD: The pattern of his cbg's indicates he needs some adjustment in his therapy Renal failure: this is the likely reason why he seems to need more mealtime insulin relative to basal. CAD and frail elderly state: he is not  a candidate for aggressive glycemic control  Patient Instructions  check resident's blood sugar 4 times a day: before the 3 meals, and at bedtime.  also check if you have symptoms of your blood sugar being too high or too low.  please keep a record of the readings and bring it to your next appointment here (or you can bring the meter itself).  You can write it on any piece of paper.  please call us sooner if your blood sugar goes below 70, or if most cbg's are over 200.  Take novolog, 12 units 3 times a day (with each meal), no matter what cbg is, and: Reduce lantus to 8 units at bedtime.  Also, take extra novolog, 2 units for any cbg over 300.  Please come back for a follow-up appointment in 2-3 months.

## 2017-12-23 ENCOUNTER — Encounter: Payer: Self-pay | Admitting: Internal Medicine

## 2017-12-23 ENCOUNTER — Non-Acute Institutional Stay (SKILLED_NURSING_FACILITY): Payer: Medicare HMO | Admitting: Internal Medicine

## 2017-12-23 DIAGNOSIS — IMO0002 Reserved for concepts with insufficient information to code with codable children: Secondary | ICD-10-CM

## 2017-12-23 DIAGNOSIS — E1165 Type 2 diabetes mellitus with hyperglycemia: Secondary | ICD-10-CM | POA: Diagnosis not present

## 2017-12-23 DIAGNOSIS — E1129 Type 2 diabetes mellitus with other diabetic kidney complication: Secondary | ICD-10-CM

## 2017-12-23 DIAGNOSIS — I5042 Chronic combined systolic (congestive) and diastolic (congestive) heart failure: Secondary | ICD-10-CM | POA: Diagnosis not present

## 2017-12-23 NOTE — Progress Notes (Signed)
This encounter was created in error - please disregard.

## 2017-12-23 NOTE — Patient Instructions (Signed)
See assessment and plan under each diagnosis in the problem list and acutely for this visit 

## 2017-12-23 NOTE — Assessment & Plan Note (Addendum)
The pathophysiology of fluid retention with salt intake was discussed. He will receive an extra dose of Lasix in the morning.

## 2017-12-23 NOTE — Progress Notes (Signed)
NURSING HOME LOCATION:  [facility] ROOM NUMBER:  [id number]  CODE STATUS:  Full Code  PCP:  [name], MD  [place] [address]  This is a nursing facility follow up of chronic medical diagnoses. Weight gain has been noted over the last 5 days.   Interim medical record and care since last [place] visit was updated with review of diagnostic studies and change in clinical status since last visit were documented.  HPI: He has chronic congestive heart failure and is monitored by Dr. Tamala Julian, Cardiologist. His diabetes which is insulin-dependent is monitored by Dr.Ellison, Endocrinologist. In the last 5 days his weight has risen from 193.2 pounds at 197.6 pounds. He has noted dyspnea on exertion after approximately 15 yards during this period of time. He also describes decreased urine output during this time. He is unaware of any trigger for the weight gain and exertional dyspnea. He states that "I eat what they bring me and I take what they give me". Intermittently he has been refusing his diuretic, particularly over the weekends. He has salt containers at the bedside but maintains he does not use them. He told the NP Student that " they are simply to aggravate Dr Linna Darner". He does have saltine crackers on the bedside table also, but again he states he is not eating these. Cardiology saw the patient 11/24/17. He was felt to be clinically stable in relationship to his heart failure at that time. Chronic edema was also attributed to previous extremity injuries as well as leg dependency. Chronic anticoagulation was not recommended for his paroxysmal AF Fasting glucoses have ranged 152-260. At lunch values are 123 up to 216. At dinner values are 186-221. He saw Dr. Loanne Drilling 12/17/17;a increased need for pre-meal insulin was attributed to renal failure. He was not a candidate for aggressive glycemic control due to his age and multiple comorbidities. Protocol was outlined. He  was to notify Dr. Loanne Drilling if sugars were frequently below 70 or usually over 200. NovoLog 12 units before each meal was to be baseline. Lantus at bedtime was reduced to 8 units. Any glucoses over 300 were to be treated with extra NovoLog 2 units.  Review of systems:  Constitutional: No fever  Eyes: No redness, discharge, pain, vision change ENT/mouth: No nasal congestion,  purulent discharge, earache,change in hearing ,sore throat  Cardiovascular: No chest pain, palpitations,paroxysmal nocturnal dyspnea, claudication, edema  Respiratory: No cough, sputum production,hemoptysis, significant snoring,apnea  Gastrointestinal: No heartburn,dysphagia,abdominal pain, nausea / vomiting,rectal bleeding, melena,change in bowels Genitourinary: No dysuria,hematuria, pyuria,  incontinence, nocturia Musculoskeletal: No joint stiffness, joint swelling, weakness,pain Dermatologic: No rash, pruritus, change in appearance of skin Neurologic: No dizziness,headache,syncope, seizures, numbness , tingling Psychiatric: No significant anxiety , depression, insomnia, anorexia Endocrine: No change in hair/skin/ nails, excessive thirst, excessive hunger, excessive urination  Hematologic/lymphatic: No significant bruising, lymphadenopathy,abnormal bleeding Allergy/immunology: No itchy/ watery eyes, significant sneezing, urticaria, angioedema  Physical exam:  Pertinent or positive findings:He has pattern alopecia. Ptosis is present. He has dense arcus senilis. He has only a few lower teeth which are eroded. Heart sounds are distant. Breath sounds are decreased except for minor rales at the bases posteriorly. Abdomen is massive. He has 1+ pitting edema. Pedal pulses are decreased. A dressing is noted of the left shin. He has isolated nail deformities.   General appearance:Adequately nourished; no acute distress , increased work of breathing is present.   Lymphatic: No lymphadenopathy about the head, neck, axilla . Eyes:  No  conjunctival inflammation or lid edema is present. There is no scleral icterus. Ears:  External ear exam shows no significant lesions or deformities.   Nose:  External nasal examination shows no deformity or inflammation. Nasal mucosa are pink and moist without lesions ,exudates Oral exam: lips and gums are healthy appearing.There is no oropharyngeal erythema or exudate . Neck:  No thyromegaly, masses, tenderness noted.    Heart:  No gallop, murmur, click, rub .  Lungs:without wheezes, rhonchi, rubs. Abdomen:Bowel sounds are normal. Abdomen is soft and nontender with no organomegaly, hernias,masses. GU: deferred  Extremities:  No cyanosis, clubbing  Neurologic exam: Strength equal  in upper & lower extremities Balance,Rhomberg,finger to nose testing could not be completed due to clinical state Skin: Warm & dry w/o tenting. No significant rash.  See summary under each active problem in the Problem List with associated updated therapeutic plan

## 2017-12-23 NOTE — Assessment & Plan Note (Signed)
Insulin regimen as per Dr. Loanne Drilling Glucoses indicate adequate control, no change in present regimen

## 2017-12-24 ENCOUNTER — Encounter: Payer: Self-pay | Admitting: Internal Medicine

## 2017-12-28 DIAGNOSIS — D649 Anemia, unspecified: Secondary | ICD-10-CM | POA: Diagnosis not present

## 2017-12-28 DIAGNOSIS — I10 Essential (primary) hypertension: Secondary | ICD-10-CM | POA: Diagnosis not present

## 2017-12-29 ENCOUNTER — Non-Acute Institutional Stay (SKILLED_NURSING_FACILITY): Payer: Medicare HMO | Admitting: Adult Health

## 2017-12-29 ENCOUNTER — Encounter: Payer: Self-pay | Admitting: Adult Health

## 2017-12-29 DIAGNOSIS — I70209 Unspecified atherosclerosis of native arteries of extremities, unspecified extremity: Secondary | ICD-10-CM

## 2017-12-29 DIAGNOSIS — E1151 Type 2 diabetes mellitus with diabetic peripheral angiopathy without gangrene: Secondary | ICD-10-CM | POA: Diagnosis not present

## 2017-12-29 NOTE — Progress Notes (Addendum)
Location:  [place] Room Number: 323-A Place of Service:  SNF (31) Provider:  Durenda Age, NP  Patient Care Team: [name], MD as PCP - General (Internal Medicine) Medina-Vargas, Senaida Lange, NP as Nurse Practitioner (Internal Medicine)  Extended Emergency Contact Information Primary Emergency Contact: [name] [place] of [place] Phone: [phone] Mobile Phone: [phone] Relation: Son Secondary Emergency Contact: [name] [place] of [place] Phone: [phone] Mobile Phone: [phone] Relation: Daughter  Code Status:  Full Code  Goals of care: Advanced Directive information Advanced Directives 07/20/2017  Does Patient Have a Medical Advance Directive? No  Type of Advance Directive -  Does patient want to make changes to medical advance directive? -  Copy of [place] in Chart? -  Would patient like information on creating a medical advance directive? No - Patient declined     Chief Complaint  Patient presents with  . Acute Visit    Patient seen for medication management    HPI:  Pt is a 84 y.o. male seen today for an acute visit for medication management.  He is a long-term care resident of [facility].  He has a PMH of HTN, DM with HLD, and CAD status post CABG. He was seen in the room today. Discussed with him about pharmacy recommendation. Insurance covered alternative for Lantus is [employer], [employer]. He agreed to the change of therapy.   Past Medical History:  Diagnosis Date  . Actinic keratitis   . CHF (congestive heart failure) ([place])   . Claudication ([place])   . Coronary artery disease    status post CABG, 1999  . Diabetes mellitus    Uncontrolled secondary to dietary noncompliance  . Diverticulosis   . DJD (degenerative joint disease)   . Hypertension    hypertensive syndrome with dyspnea -[facility], February, 2011 - EF 50%  and cardiac bypass grafts all patent - responded to diuretics and blood pressure control - [name]  . Microscopic hematuria   . Onychomycosis   . Pneumonia 05/2017  . Prostate nodule   . PSVT (paroxysmal supraventricular tachycardia) ([place])   . Renal disorder   . UTI (urinary tract infection) 09/02/2017   sensitive to Ceftriaxone  . Vitamin B12 deficiency   . Vitamin D deficiency    Past Surgical History:  Procedure Laterality Date  . APPENDECTOMY    . CORONARY ARTERY BYPASS GRAFT       No Known Allergies  Outpatient Encounter Medications as of 12/29/2017  Medication Sig  . acetaminophen (TYLENOL) 325 MG tablet Take 650 mg by mouth every 6 (six) hours as needed for mild pain.  Marland Kitchen amLODipine (NORVASC) 5 MG tablet Take 5 mg by mouth daily.  Marland Kitchen aspirin EC 81 MG EC tablet Take 1 tablet (81 mg total) by mouth daily.  . bisacodyl (DULCOLAX) 10 MG suppository Place 10 mg rectally once as needed (FOR CONSTIPATION NOT RELIEVED BY MILK OF MAGNESIA).  Marland Kitchen cholecalciferol (VITAMIN D) 1000 units tablet Take 1,000 Units by mouth daily.  . furosemide (LASIX) 80 MG tablet Take 1 tablet (80 mg total) by mouth daily.  . hydrALAZINE (APRESOLINE) 10 MG tablet Take 10 mg by mouth 2 (two) times daily.  . insulin aspart (NOVOLOG) 100 UNIT/ML injection Inject 10-12 Units into the skin 3 (three) times daily before meals. Inject an additional 2 units if CBG is over 300  . insulin glargine (LANTUS) 100 UNIT/ML injection Inject 14 Units into  the skin at bedtime.  Marland Kitchen ipratropium-albuterol (DUONEB) 0.5-2.5 (3) MG/3ML SOLN Take 3 mLs by nebulization every 6 (six) hours as needed.  . isosorbide mononitrate (IMDUR) 30 MG 24 hr tablet Take 30 mg by mouth daily.   Marland Kitchen latanoprost (XALATAN) 0.005 % ophthalmic solution Place 1 drop into both eyes every evening.   . magnesium hydroxide (MILK OF MAGNESIA) 400 MG/5ML suspension Take 5 mLs by mouth daily as needed for mild constipation.  . metoprolol (LOPRESSOR) 50 MG  tablet TAKE 1 TABLET (50 MG TOTAL) BY MOUTH 2 (TWO) TIMES DAILY.  . Multiple Vitamin (MULTIVITAMIN) tablet Take 1 tablet by mouth daily.  Marland Kitchen Neomycin-Bacitracin-Polymyxin (TRIPLE ANTIBIOTIC EX) Apply 1 application topically daily. Apply to right nostril  . nitroGLYCERIN (NITROSTAT) 0.4 MG SL tablet Place 0.4 mg under the tongue every 5 (five) minutes x 3 doses as needed for chest pain.   . pantoprazole (PROTONIX) 40 MG tablet Take 40 mg by mouth 2 (two) times daily.  . potassium chloride SA (K-DUR,KLOR-CON) 20 MEQ tablet Take 1 tablet (20 mEq total) by mouth 2 (two) times daily.  . rosuvastatin (CRESTOR) 10 MG tablet Take 10 mg by mouth. M-W-F  . sennosides-docusate sodium (SENOKOT-S) 8.6-50 MG tablet Take 2 tablets by mouth 2 (two) times daily.   . tamsulosin (FLOMAX) 0.4 MG CAPS capsule Take 0.4 mg by mouth at bedtime.   . vitamin B-12 (CYANOCOBALAMIN) 1000 MCG tablet Take 1,000 mcg by mouth daily.    No facility-administered encounter medications on file as of 12/29/2017.     Review of Systems  GENERAL: No change in appetite, no fatigue, no weight changes, no fever, chills or weakness MOUTH and THROAT: Denies oral discomfort, gingival pain or bleeding, pain from teeth or hoarseness   RESPIRATORY: no cough, SOB, DOE, wheezing, hemoptysis CARDIAC: No chest pain, or palpitations GI: No abdominal pain, diarrhea, constipation, heart burn, nausea or vomiting PSYCHIATRIC: Denies feelings of depression or anxiety. No report of hallucinations, insomnia, paranoia, or agitation   Immunization History  Administered Date(s) Administered  . Influenza-Unspecified 08/06/2015, 09/03/2016, 09/02/2017  . PPD Test 09/20/2015  . Pneumococcal Polysaccharide-23 08/06/2015  . Pneumococcal-Unspecified 09/03/2016  . Tdap 07/28/2015   Pertinent  Health Maintenance Due  Topic Date Due  . OPHTHALMOLOGY EXAM  09/28/1930  . PNA vac Low Risk Adult (2 of 2 - PCV13) 09/03/2017  . URINE MICROALBUMIN  11/17/2023  (Originally 12/10/2016)  . HEMOGLOBIN A1C  06/16/2018  . FOOT EXAM  12/17/2018  . INFLUENZA VACCINE  Completed   Fall Risk  07/20/2017 01/05/2017 09/09/2016 08/24/2016 08/07/2016  Falls in the past year? _0       Vitals:   12/29/17 1001  BP: 129/67  Pulse: 67  Resp: 20  Temp: 98.4 F (36.9 C)  TempSrc: Oral  SpO2: 93%  Weight: 196 lb (88.9 kg)  Height: _1  (1.727 m)   Body mass index is 29.8 kg/m.  Physical Exam  GENERAL APPEARANCE: Well nourished. In no acute distress. Normal body habitus SKIN:  Left shin wound covered with dressing MOUTH and THROAT: Lips are without lesions. Oral mucosa is moist and without lesions. Tongue is normal in shape, size, and color and without lesions RESPIRATORY: Breathing is even & unlabored, BS CTAB CARDIAC: RRR, no murmur,no extra heart sounds, LLE 2+ edema, RLE 1+ GI: Abdomen soft, normal BS, no masses, no tenderness EXTREMITIES:  Able to move X 4 extremities PSYCHIATRIC: Alert and oriented X 3. Affect and behavior are appropriate  Labs  reviewed: 12/28/17  Glucose 162, calcium 9.7  Creatinine 2.16, BUN Na 140 K 4.5 CO2 21  eGFR 25.06 Recent Labs    06/11/17 1907  06/23/17 0614 06/23/17 1441  06/25/17 0315 06/26/17 0416  09/10/17 10/11/17 1520 11/19/17  NA 127*   < >  --  138   < > 138 139   < > 136* 139 139  K 5.0   < >  --  4.0   < > 3.6 3.8   < > 4.8 4.7 4.3  CL 93*   < >  --  106   < > 105 104  --   --  100  --   CO2 22   < >  --  21*   < > 24 24  --   --  20  --   GLUCOSE 275*   < >  --  195*   < > 139* 105*  --   --  225*  --   BUN 127*   < >  --  41*   < > 30* 29*   < > 54* 51* 57*  CREATININE 4.14*   < >  --  2.11*   < > 1.87* 1.86*   < > 1.9* 2.07* 2.0*  CALCIUM 8.9   < >  --  8.6*   < > 8.7* 8.8*  --   --  9.0  --   MG 1.7  --  1.9 1.8  --   --   --   --   --   --   --   PHOS  --   --  4.0  --   --  3.4 3.8  --   --   --   --    < > = values in this interval not displayed.   Recent Labs    06/22/17 1608  06/23/17 0108 06/23/17 1441 06/25/17 0315 06/26/17 0416 07/01/17  AST _0 --   --  13*  ALT _1 --   --  19  ALKPHOS 70 65 72  --   --  67  BILITOT 1.4* 1.5* 1.3*  --   --   --   PROT 7.0 6.4* 6.6  --   --   --   ALBUMIN 3.2* 3.0* 2.9* 3.0* 2.9*  --    Recent Labs    06/11/17 1907  06/23/17 1441  06/25/17 0315 06/26/17 0416 07/01/17 09/01/17 10/11/17 1520  WBC 8.9   < > 9.5   < > 6.0 6.9 8.6 9.6 8.4  NEUTROABS 6.3  --  7.3  --   --   --  6  --   --   HGB 11.4*   < > 9.4*   < > 9.8* 9.6* 10.7* 9.4* 10.5*  HCT 33.7*   < > 28.1*   < > 29.3* 29.1* 32* 28* 32.3*  MCV 86.6   < > 87.5   < > 86.2 86.4  --   --  90  PLT 183   < > 177   < > 217 237 262 211 229   < > = values in this interval not displayed.   Lab Results  Component Value Date   TSH 1.266 06/23/2017   Lab Results  Component Value Date   HGBA1C 8.4 12/17/2017   Lab Results  Component Value Date   CHOL 181 06/19/2017   HDL 49 06/19/2017   LDLCALC 117  06/19/2017   TRIG 75 06/19/2017   CHOLHDL 2.5 01/01/2010    Assessment/Plan  1. 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 - 5:00 p.m.) [phone] (after hours)

## 2018-01-10 ENCOUNTER — Non-Acute Institutional Stay (SKILLED_NURSING_FACILITY): Payer: Medicare HMO | Admitting: Adult Health

## 2018-01-10 ENCOUNTER — Encounter: Payer: Self-pay | Admitting: Adult Health

## 2018-01-10 DIAGNOSIS — I5042 Chronic combined systolic (congestive) and diastolic (congestive) heart failure: Secondary | ICD-10-CM | POA: Diagnosis not present

## 2018-01-10 DIAGNOSIS — J309 Allergic rhinitis, unspecified: Secondary | ICD-10-CM

## 2018-01-10 DIAGNOSIS — N184 Chronic kidney disease, stage 4 (severe): Secondary | ICD-10-CM

## 2018-01-10 NOTE — Progress Notes (Addendum)
Location:  [place] Room Number: 323-A Place of Service:  SNF (31) Provider:  Durenda Age, NP  Patient Care Team: [name], MD as PCP - General (Internal Medicine) Medina-Vargas, Senaida Lange, NP as Nurse Practitioner (Internal Medicine)  Extended Emergency Contact Information Primary Emergency Contact: [name] [place] of [place] Phone: [phone] Mobile Phone: [phone] Relation: Son Secondary Emergency Contact: [name] [place] of [place] Phone: [phone] Mobile Phone: [phone] Relation: Daughter  Code Status:  Full Code  Goals of care: Advanced Directive information Advanced Directives 07/20/2017  Does Patient Have a Medical Advance Directive? No  Type of Advance Directive -  Does patient want to make changes to medical advance directive? -  Copy of [place] in Chart? -  Would patient like information on creating a medical advance directive? No - Patient declined     Chief Complaint  Patient presents with  . Acute Visit    Patient is complaining of a sore throat    HPI:  Pt is a 84 y.o. male seen today for an acute visit due to complaints of a sore throat.  He is a long-term care resident of [facility].  He has a PMH of HTN, DM with HLD, and CAD status post CABG. He was seen in the room today. He complained of sore throat and stuffy nose. No noted erythema on his throat. He reports spitting out "clear phlegm." He said that he has stuffy nose especially at night. He denies having body malaise nor fever.   Past Medical History:  Diagnosis Date  . Actinic keratitis   . CHF (congestive heart failure) ([place])   . Claudication ([place])   . Coronary artery disease    status post CABG, 1999  . Diabetes mellitus    Uncontrolled secondary to dietary noncompliance  . Diverticulosis   . DJD (degenerative joint disease)   . Hypertension    hypertensive syndrome with dyspnea -[facility], February, 2011 - EF 50% and cardiac bypass grafts all patent - responded to diuretics and blood pressure control - [name]  . Microscopic hematuria   . Onychomycosis   . Pneumonia 05/2017  . Prostate nodule   . PSVT (paroxysmal supraventricular tachycardia) ([place])   . Renal disorder   . UTI (urinary tract infection) 09/02/2017   sensitive to Ceftriaxone  . Vitamin B12 deficiency   . Vitamin D deficiency    Past Surgical History:  Procedure Laterality Date  . APPENDECTOMY    . CORONARY ARTERY BYPASS GRAFT      No Known Allergies  Outpatient Encounter Medications as of 01/10/2018  Medication Sig  . acetaminophen (TYLENOL) 325 MG tablet Take 650 mg by mouth every 6 (six) hours as needed for mild pain.  Marland Kitchen amLODipine (NORVASC) 5 MG tablet Take 5 mg by mouth daily.  Marland Kitchen aspirin EC 81 MG EC tablet Take 1 tablet (81 mg total) by mouth daily.  . bisacodyl (DULCOLAX) 10 MG suppository Place 10 mg rectally once as needed (FOR CONSTIPATION NOT RELIEVED BY MILK OF MAGNESIA).  Marland Kitchen cholecalciferol (VITAMIN D) 1000 units tablet Take 1,000 Units by mouth daily.  . furosemide (LASIX) 80 MG tablet Take 1 tablet (80 mg total) by mouth daily.  . hydrALAZINE (APRESOLINE) 10 MG tablet Take 10 mg by mouth 2 (two) times daily.  . insulin aspart (NOVOLOG) 100 UNIT/ML injection Inject 10-12 Units into the skin 3 (three) times daily before meals. Inject  10 units before each meal, inject an additional 2 units if CBG is over 300  . Insulin Glargine (BASAGLAR KWIKPEN) 100 UNIT/ML SOPN Inject 14 Units into the skin at bedtime.  Marland Kitchen ipratropium-albuterol (DUONEB) 0.5-2.5 (3) MG/3ML SOLN Take 3 mLs by nebulization every 6 (six) hours as needed.  . isosorbide mononitrate (IMDUR) 30 MG 24 hr tablet Take 30 mg by mouth daily.   Marland Kitchen latanoprost (XALATAN) 0.005 % ophthalmic solution Place 1 drop into both eyes every evening.   . magnesium hydroxide (MILK OF  MAGNESIA) 400 MG/5ML suspension Take 5 mLs by mouth daily as needed for mild constipation.  . metoprolol (LOPRESSOR) 50 MG tablet TAKE 1 TABLET (50 MG TOTAL) BY MOUTH 2 (TWO) TIMES DAILY.  . Multiple Vitamin (MULTIVITAMIN) tablet Take 1 tablet by mouth daily.  Marland Kitchen Neomycin-Bacitracin-Polymyxin (TRIPLE ANTIBIOTIC EX) Apply 1 application topically daily. Apply to right nostril  . nitroGLYCERIN (NITROSTAT) 0.4 MG SL tablet Place 0.4 mg under the tongue every 5 (five) minutes x 3 doses as needed for chest pain.   . pantoprazole (PROTONIX) 40 MG tablet Take 40 mg by mouth 2 (two) times daily.  . potassium chloride SA (K-DUR,KLOR-CON) 20 MEQ tablet Take 1 tablet (20 mEq total) by mouth 2 (two) times daily.  . rosuvastatin (CRESTOR) 10 MG tablet Take 10 mg by mouth. M-W-F  . sennosides-docusate sodium (SENOKOT-S) 8.6-50 MG tablet Take 2 tablets by mouth 2 (two) times daily.   . tamsulosin (FLOMAX) 0.4 MG CAPS capsule Take 0.4 mg by mouth at bedtime.   . vitamin B-12 (CYANOCOBALAMIN) 1000 MCG tablet Take 1,000 mcg by mouth daily.   . [DISCONTINUED] insulin glargine (LANTUS) 100 UNIT/ML injection Inject 14 Units into the skin at bedtime.   No facility-administered encounter medications on file as of 01/10/2018.     Review of Systems  GENERAL: No change in appetite, no fatigue, no weight changes, no fever, chills or weakness MOUTH and THROAT: +sorethroat  RESPIRATORY: no cough, SOB, DOE, wheezing, hemoptysis CARDIAC: No chest pain, edema or palpitations GI: No abdominal pain, diarrhea, constipation, heart burn, nausea or vomiting GU: Denies dysuria, frequency, hematuria, incontinence, or discharge PSYCHIATRIC: Denies feelings of depression or anxiety. No report of hallucinations, insomnia, paranoia, or agitation   Immunization History  Administered Date(s) Administered  . Influenza-Unspecified 08/06/2015, 09/03/2016, 09/02/2017  . PPD Test 09/20/2015  . Pneumococcal Polysaccharide-23 08/06/2015  .  Pneumococcal-Unspecified 09/03/2016  . Tdap 07/28/2015   Pertinent  Health Maintenance Due  Topic Date Due  . OPHTHALMOLOGY EXAM  09/28/1930  . PNA vac Low Risk Adult (2 of 2 - PCV13) 01/10/2019 (Originally 09/03/2017)  . URINE MICROALBUMIN  11/17/2023 (Originally 12/10/2016)  . HEMOGLOBIN A1C  06/16/2018  . FOOT EXAM  12/17/2018  . INFLUENZA VACCINE  Completed   Fall Risk  07/20/2017 01/05/2017 09/09/2016 08/24/2016 08/07/2016  Falls in the past year? No No No No No   Body mass index is 29.71 kg/m.   Physical Exam  GENERAL APPEARANCE: Well nourished. In no acute distress. Normal body habitus SKIN:  Skin is warm and dry.  MOUTH and THROAT: Lips are without lesions. Oral mucosa is moist and without lesions. Tongue is normal in shape, size, and color and without lesions RESPIRATORY: Breathing is even & unlabored, BS CTAB CARDIAC: RRR, no murmur,no extra heart sounds, no edema GI: Abdomen soft, normal BS, no masses, no tenderness EXTREMITIES:  Able to move X 4 extremities, wears bilateral orthotic shoes  PSYCHIATRIC: Alert and oriented X 3. Affect and behavior  are appropriate   Labs reviewed: 12/28/17  Glucose 162  Ca 9.7  Creatinine 2.16  BUN 51.5  Na 140  K 4.5  eGFR 25.06 Recent Labs    06/11/17 1907  06/23/17 0614 06/23/17 1441  06/25/17 0315 06/26/17 0416  09/10/17 10/11/17 1520 11/19/17  NA 127*   < >  --  138   < > 138 139   < > 136* 139 139  K 5.0   < >  --  4.0   < > 3.6 3.8   < > 4.8 4.7 4.3  CL 93*   < >  --  106   < > 105 104  --   --  100  --   CO2 22   < >  --  21*   < > 24 24  --   --  20  --   GLUCOSE 275*   < >  --  195*   < > 139* 105*  --   --  225*  --   BUN 127*   < >  --  41*   < > 30* 29*   < > 54* 51* 57*  CREATININE 4.14*   < >  --  2.11*   < > 1.87* 1.86*   < > 1.9* 2.07* 2.0*  CALCIUM 8.9   < >  --  8.6*   < > 8.7* 8.8*  --   --  9.0  --   MG 1.7  --  1.9 1.8  --   --   --   --   --   --   --   PHOS  --   --  4.0  --   --  3.4 3.8  --   --   --   --     < > = values in this interval not displayed.   Recent Labs    06/22/17 1608 06/23/17 0108 06/23/17 1441 06/25/17 0315 06/26/17 0416 07/01/17  AST '27 23 22  ' --   --  13*  ALT '26 23 25  ' --   --  19  ALKPHOS 70 65 72  --   --  67  BILITOT 1.4* 1.5* 1.3*  --   --   --   PROT 7.0 6.4* 6.6  --   --   --   ALBUMIN 3.2* 3.0* 2.9* 3.0* 2.9*  --    Recent Labs    06/11/17 1907  06/23/17 1441  06/25/17 0315 06/26/17 0416 07/01/17 09/01/17 10/11/17 1520  WBC 8.9   < > 9.5   < > 6.0 6.9 8.6 9.6 8.4  NEUTROABS 6.3  --  7.3  --   --   --  6  --   --   HGB 11.4*   < > 9.4*   < > 9.8* 9.6* 10.7* 9.4* 10.5*  HCT 33.7*   < > 28.1*   < > 29.3* 29.1* 32* 28* 32.3*  MCV 86.6   < > 87.5   < > 86.2 86.4  --   --  90  PLT 183   < > 177   < > 217 237 262 211 229   < > = values in this interval not displayed.   Lab Results  Component Value Date   TSH 1.266 06/23/2017   Lab Results  Component Value Date   HGBA1C 8.4 12/17/2017   Lab Results  Component Value Date   CHOL 181 06/19/2017  HDL 49 06/19/2017   LDLCALC 117 06/19/2017   TRIG 75 06/19/2017   CHOLHDL 2.5 01/01/2010     Assessment/Plan  1. Allergic rhinitis - will start Flonase 50 mcg instill 2 sprays to each nostril daily   2. Chronic combined systolic and diastolic CHF (congestive heart failure) (HCC) - no SOB, weight is steady at 195.4, noted 2 entries for Lasix 80 mg daily, will discontinue Lasix 80 mg @ 10AM and continue Lasix 80 mg Q 9AM, continue Hydralazine 10 mg 1tab BID, Metoprolol tartrate 50 mg 1 tab BID   3. Chronic kidney disease, stage 4 (severe) (HCC) - creatinine 2.16 (12/28/17), will re-check BMP     Durenda Age, NP [facility] [phone] (Monday-Friday 8:00 a.m. - 5:00 p.m.) [phone] (after hours)

## 2018-01-11 DIAGNOSIS — I10 Essential (primary) hypertension: Secondary | ICD-10-CM | POA: Diagnosis not present

## 2018-01-11 DIAGNOSIS — N183 Chronic kidney disease, stage 3 (moderate): Secondary | ICD-10-CM | POA: Diagnosis not present

## 2018-01-11 DIAGNOSIS — D649 Anemia, unspecified: Secondary | ICD-10-CM | POA: Diagnosis not present

## 2018-01-11 LAB — BASIC METABOLIC PANEL
BUN: 58 — AB (ref 4–21)
CREATININE: 2.4 — AB (ref 0.6–1.3)
Glucose: 154
Potassium: 4.7 (ref 3.4–5.3)
Sodium: 140 (ref 137–147)

## 2018-01-12 ENCOUNTER — Encounter: Payer: Self-pay | Admitting: Adult Health

## 2018-01-12 ENCOUNTER — Non-Acute Institutional Stay (SKILLED_NURSING_FACILITY): Payer: Medicare HMO | Admitting: Adult Health

## 2018-01-12 DIAGNOSIS — I5043 Acute on chronic combined systolic (congestive) and diastolic (congestive) heart failure: Secondary | ICD-10-CM

## 2018-01-12 DIAGNOSIS — R0602 Shortness of breath: Secondary | ICD-10-CM | POA: Diagnosis not present

## 2018-01-12 DIAGNOSIS — N184 Chronic kidney disease, stage 4 (severe): Secondary | ICD-10-CM

## 2018-01-12 NOTE — Progress Notes (Signed)
Location:  [facility] Room Number: 323-A Place of Service:  SNF (31) Provider:  Durenda Age, NP  Patient Care Team: [name], MD as PCP - General (Internal Medicine) Medina-Vargas, Senaida Lange, NP as Nurse Practitioner (Internal Medicine)  Extended Emergency Contact Information Primary Emergency Contact: [name] [place] of [place] Phone: [phone] Mobile Phone: [phone] Relation: Son Secondary Emergency Contact: [name] [place] Phone: [phone] Mobile Phone: [phone] Relation: Daughter  Code Status:  Full Code  Goals of care: Advanced Directive information Advanced Directives 07/20/2017  Does Patient Have a Medical Advance Directive? No  Type of Advance Directive -  Does patient want to make changes to medical advance directive? -  Copy of [name] in Chart? -  Would patient like information on creating a medical advance directive? No - Patient declined     Chief Complaint  Patient presents with  . Acute Visit    Patient had a documented weight gain and complained of shortness of breath    HPI:  Pt is a 84 y.o. Cole [name] seen today for an acute visit due to weight gain and complaints of shortness of breath.  He is a long-term care resident of [facility].  He has a PMH of HTN, DM with HLD, and CAD status post CABG. He had gained 4.8 lbs in 2 days. He said that he felt good in the morning but in the afternoon he doesn't feel good. He said that he is not hurting. He has no wheezing but feels slightly short of breath (O2 sat of 92%). Latest creatinine is 2.35, BUN 57.8 Na 140  K 4.7 eGFR 22.62. Chest x-ray revealed no acute infiltrate.      Past Medical History:  Diagnosis Date  . Actinic keratitis   . CHF (congestive heart failure) ([place])   . Claudication ([place])   . Coronary artery disease    status post CABG, 1999  . Diabetes mellitus     Uncontrolled secondary to dietary noncompliance  . Diverticulosis   . DJD (degenerative joint disease)   . Hypertension    hypertensive syndrome with dyspnea -[facility], February, 2011 - EF 50% and cardiac bypass grafts all patent - responded to diuretics and blood pressure control - [name]  . Microscopic hematuria   . Onychomycosis   . Pneumonia 05/2017  . Prostate nodule   . PSVT (paroxysmal supraventricular tachycardia) ([place])   . Renal disorder   . UTI (urinary tract infection) 09/02/2017   sensitive to Ceftriaxone  . Vitamin B12 deficiency   . Vitamin D deficiency    Past Surgical History:  Procedure Laterality Date  . APPENDECTOMY    . CORONARY ARTERY BYPASS GRAFT      No Known Allergies  Outpatient Encounter Medications as of 01/12/2018  Medication Sig  . acetaminophen (TYLENOL) 325 MG tablet Take 650 mg by mouth every 6 (six) hours as needed for mild pain.  Marland Kitchen amLODipine (NORVASC) 5 MG tablet Take 5 mg by mouth daily.  Marland Kitchen aspirin EC 81 MG EC tablet Take 1 tablet (81 mg total) by mouth daily.  . bisacodyl (DULCOLAX) 10 MG suppository Place 10 mg rectally once as needed (FOR CONSTIPATION NOT RELIEVED BY MILK OF MAGNESIA).  Marland Kitchen cholecalciferol (VITAMIN D) 1000 units tablet Take 1,000 Units by mouth daily.  . Fluticasone Propionate (FLONASE ALLERGY RELIEF NA) Place 2 sprays into the nose at bedtime. Each nostril for allergic  rhinitis  . furosemide (LASIX) 80 MG tablet Take 1 tablet (80 mg total) by mouth daily.  . hydrALAZINE (APRESOLINE) 10 MG tablet Take 10 mg by mouth 2 (two) times daily.  . insulin aspart (NOVOLOG) 100 UNIT/ML injection Inject 10-12 Units into the skin 3 (three) times daily before meals. Inject 10 units before each meal, inject an additional 2 units if CBG is over 300  . Insulin Glargine (BASAGLAR KWIKPEN) 100 UNIT/ML SOPN Inject 14 Units into the skin at bedtime.  Marland Kitchen ipratropium-albuterol (DUONEB) 0.5-2.5 (3) MG/3ML SOLN Take 3 mLs  by nebulization every 6 (six) hours as needed.  . isosorbide mononitrate (IMDUR) 30 MG 24 hr tablet Take 30 mg by mouth daily.   Marland Kitchen latanoprost (XALATAN) 0.005 % ophthalmic solution Place 1 drop into both eyes every evening.   . magnesium hydroxide (MILK OF MAGNESIA) 400 MG/5ML suspension Take 5 mLs by mouth daily as needed for mild constipation.  . metoprolol (LOPRESSOR) 50 MG tablet TAKE 1 TABLET (50 MG TOTAL) BY MOUTH 2 (TWO) TIMES DAILY.  . Multiple Vitamin (MULTIVITAMIN) tablet Take 1 tablet by mouth daily.  Marland Kitchen Neomycin-Bacitracin-Polymyxin (TRIPLE ANTIBIOTIC EX) Apply 1 application topically daily. Apply to right nostril  . nitroGLYCERIN (NITROSTAT) 0.4 MG SL tablet Place 0.4 mg under the tongue every 5 (five) minutes x 3 doses as needed for chest pain.   . pantoprazole (PROTONIX) 40 MG tablet Take 40 mg by mouth 2 (two) times daily.  . potassium chloride SA (K-DUR,KLOR-CON) 20 MEQ tablet Take 1 tablet (20 mEq total) by mouth 2 (two) times daily.  . rosuvastatin (CRESTOR) 10 MG tablet Take 10 mg by mouth. M-W-F  . sennosides-docusate sodium (SENOKOT-S) 8.6-50 MG tablet Take 2 tablets by mouth 2 (two) times daily.   . tamsulosin (FLOMAX) 0.4 MG CAPS capsule Take 0.4 mg by mouth at bedtime.   . vitamin B-12 (CYANOCOBALAMIN) 1000 MCG tablet Take 1,000 mcg by mouth daily.    No facility-administered encounter medications on file as of 01/12/2018.     Review of Systems  GENERAL: +weight gain MOUTH and THROAT: Denies oral discomfort, gingival pain or bleeding, pain from teeth or hoarseness   RESPIRATORY: +SOB CARDIAC: No chest pain, or palpitations GI: No abdominal pain, diarrhea, constipation, heart burn, nausea or vomiting GU: Denies dysuria, frequency, hematuria, incontinence, or discharge PSYCHIATRIC: Denies feelings of depression or anxiety. No report of hallucinations, insomnia, paranoia, or agitation   Immunization History  Administered Date(s) Administered  . Influenza-Unspecified  08/06/2015, 09/03/2016, 09/02/2017  . PPD Test 09/20/2015  . Pneumococcal Polysaccharide-23 08/06/2015  . Pneumococcal-Unspecified 09/03/2016  . Tdap 07/28/2015   Pertinent  Health Maintenance Due  Topic Date Due  . OPHTHALMOLOGY EXAM  09/28/1930  . PNA vac Low Risk Adult (2 of 2 - PCV13) 01/10/2019 (Originally 09/03/2017)  . URINE MICROALBUMIN  11/17/2023 (Originally 12/10/2016)  . HEMOGLOBIN A1C  06/16/2018  . FOOT EXAM  12/17/2018  . INFLUENZA VACCINE  Completed   Fall Risk  07/20/2017 01/05/2017 09/09/2016 08/24/2016 08/07/2016  Falls in the past year? _0       Vitals:   01/12/18 1707  BP: (!) 147/87  Pulse: 65  Resp: 19  Temp: 98.6 F (37 C)  TempSrc: Oral  SpO2: 93%  Weight: 195 lb 6.4 oz (88.6 kg)  Height: _1  (1.727 m)   Body mass index is 29.71 kg/m.  Physical Exam  GENERAL APPEARANCE: Well nourished. SKIN:  Skin is warm and dry.  MOUTH and THROAT: Lips  are without lesions. Oral mucosa is moist and without lesions. Tongue is normal in shape, size, and color and without lesions RESPIRATORY: no wheezing CARDIAC: RRR, no murmur,no extra heart sounds, BLE 1-2+ edema GI: Abdomen soft, normal BS, no masses, no tenderness EXTREMITIES:  Able to move X 4 extremities PSYCHIATRIC: Alert and oriented X 3. Affect and behavior are appropriate   Labs reviewed: Recent Labs    06/11/17 1907  06/23/17 0614 06/23/17 1441  06/25/17 0315 06/26/17 0416  09/10/17 10/11/17 1520 11/19/17  NA 127*   < >  --  138   < > 138 139   < > 136* 139 139  K 5.0   < >  --  4.0   < > 3.6 3.8   < > 4.8 4.7 4.3  CL 93*   < >  --  106   < > 105 104  --   --  100  --   CO2 22   < >  --  21*   < > 24 24  --   --  20  --   GLUCOSE 275*   < >  --  195*   < > 139* 105*  --   --  225*  --   BUN 127*   < >  --  41*   < > 30* 29*   < > 54* 51* 57*  CREATININE 4.14*   < >  --  2.11*   < > 1.87* 1.86*   < > 1.9* 2.07* 2.0*  CALCIUM 8.9   < >  --  8.6*   < > 8.7* 8.8*  --   --  9.0  --     MG 1.7  --  1.9 1.8  --   --   --   --   --   --   --   PHOS  --   --  4.0  --   --  3.4 3.8  --   --   --   --    < > = values in this interval not displayed.   Recent Labs    06/22/17 1608 06/23/17 0108 06/23/17 1441 06/25/17 0315 06/26/17 0416 07/01/17  AST _0 --   --  13*  ALT _1 --   --  19  ALKPHOS 70 65 72  --   --  67  BILITOT 1.4* 1.5* 1.3*  --   --   --   PROT 7.0 6.4* 6.6  --   --   --   ALBUMIN 3.2* 3.0* 2.9* 3.0* 2.9*  --    Recent Labs    06/11/17 1907  06/23/17 1441  06/25/17 0315 06/26/17 0416 07/01/17 09/01/17 10/11/17 1520  WBC 8.9   < > 9.5   < > 6.0 6.9 8.6 9.6 8.4  NEUTROABS 6.3  --  7.3  --   --   --  6  --   --   HGB 11.4*   < > 9.4*   < > 9.8* 9.6* 10.7* 9.4* 10.5*  HCT 33.7*   < > 28.1*   < > 29.3* 29.1* 32* 28* 32.3*  MCV 86.6   < > 87.5   < > 86.2 86.4  --   --  90  PLT 183   < > 177   < > 217 237 262 211 229   < > = values in this interval not displayed.  Lab Results  Component Value Date   TSH 1.266 06/23/2017   Lab Results  Component Value Date   HGBA1C 8.4 12/17/2017   Lab Results  Component Value Date   CHOL 181 06/19/2017   HDL 49 06/19/2017   LDLCALC 117 06/19/2017   TRIG 75 06/19/2017   CHOLHDL 2.5 01/01/2010    Assessment/Plan  1. Acute on chronic combined systolic (congestive) and diastolic (congestive) heart failure (HCC) -  Continue Furosemide 80 mg daily, give Metolazone 2.5 mg PO X 1, check BNP   2. Chronic kidney disease, stage 4 (severe) (HCC) - creatinine 2.35, will re-check BMP     Durenda Age, NP [facility] [phone] (Monday-Friday 8:00 a.m. - 5:00 p.m.) [phone] (after hours)

## 2018-01-13 ENCOUNTER — Other Ambulatory Visit: Payer: Self-pay

## 2018-01-13 ENCOUNTER — Emergency Department (HOSPITAL_COMMUNITY): Payer: Medicare HMO

## 2018-01-13 ENCOUNTER — Emergency Department (HOSPITAL_COMMUNITY)
Admission: EM | Admit: 2018-01-13 | Discharge: 2018-01-13 | Disposition: A | Discharge status: Home / Self Care | Payer: Medicare HMO | Attending: Emergency Medicine | Admitting: Emergency Medicine

## 2018-01-13 ENCOUNTER — Encounter (HOSPITAL_COMMUNITY): Payer: Self-pay | Admitting: Emergency Medicine

## 2018-01-13 DIAGNOSIS — J209 Acute bronchitis, unspecified: Secondary | ICD-10-CM | POA: Diagnosis not present

## 2018-01-13 DIAGNOSIS — N184 Chronic kidney disease, stage 4 (severe): Secondary | ICD-10-CM | POA: Diagnosis not present

## 2018-01-13 DIAGNOSIS — I5042 Chronic combined systolic (congestive) and diastolic (congestive) heart failure: Secondary | ICD-10-CM | POA: Diagnosis not present

## 2018-01-13 DIAGNOSIS — Z79899 Other long term (current) drug therapy: Secondary | ICD-10-CM | POA: Diagnosis not present

## 2018-01-13 DIAGNOSIS — Z794 Long term (current) use of insulin: Secondary | ICD-10-CM | POA: Diagnosis not present

## 2018-01-13 DIAGNOSIS — E1122 Type 2 diabetes mellitus with diabetic chronic kidney disease: Secondary | ICD-10-CM | POA: Diagnosis not present

## 2018-01-13 DIAGNOSIS — J80 Acute respiratory distress syndrome: Secondary | ICD-10-CM | POA: Diagnosis not present

## 2018-01-13 DIAGNOSIS — I130 Hypertensive heart and chronic kidney disease with heart failure and stage 1 through stage 4 chronic kidney disease, or unspecified chronic kidney disease: Secondary | ICD-10-CM | POA: Diagnosis not present

## 2018-01-13 DIAGNOSIS — I2510 Atherosclerotic heart disease of native coronary artery without angina pectoris: Secondary | ICD-10-CM | POA: Diagnosis not present

## 2018-01-13 DIAGNOSIS — R509 Fever, unspecified: Secondary | ICD-10-CM

## 2018-01-13 DIAGNOSIS — J189 Pneumonia, unspecified organism: Secondary | ICD-10-CM | POA: Diagnosis not present

## 2018-01-13 DIAGNOSIS — J40 Bronchitis, not specified as acute or chronic: Secondary | ICD-10-CM | POA: Diagnosis not present

## 2018-01-13 DIAGNOSIS — R069 Unspecified abnormalities of breathing: Secondary | ICD-10-CM | POA: Diagnosis not present

## 2018-01-13 DIAGNOSIS — I129 Hypertensive chronic kidney disease with stage 1 through stage 4 chronic kidney disease, or unspecified chronic kidney disease: Secondary | ICD-10-CM | POA: Diagnosis not present

## 2018-01-13 DIAGNOSIS — I4892 Unspecified atrial flutter: Secondary | ICD-10-CM | POA: Diagnosis not present

## 2018-01-13 DIAGNOSIS — Z951 Presence of aortocoronary bypass graft: Secondary | ICD-10-CM | POA: Diagnosis not present

## 2018-01-13 DIAGNOSIS — D649 Anemia, unspecified: Secondary | ICD-10-CM | POA: Diagnosis not present

## 2018-01-13 DIAGNOSIS — N390 Urinary tract infection, site not specified: Secondary | ICD-10-CM | POA: Diagnosis not present

## 2018-01-13 DIAGNOSIS — Z87891 Personal history of nicotine dependence: Secondary | ICD-10-CM | POA: Insufficient documentation

## 2018-01-13 DIAGNOSIS — R0602 Shortness of breath: Secondary | ICD-10-CM | POA: Diagnosis not present

## 2018-01-13 DIAGNOSIS — E119 Type 2 diabetes mellitus without complications: Secondary | ICD-10-CM | POA: Diagnosis not present

## 2018-01-13 DIAGNOSIS — Z7982 Long term (current) use of aspirin: Secondary | ICD-10-CM | POA: Diagnosis not present

## 2018-01-13 DIAGNOSIS — I10 Essential (primary) hypertension: Secondary | ICD-10-CM | POA: Diagnosis not present

## 2018-01-13 LAB — CBC WITH DIFFERENTIAL/PLATELET
BASOS ABS: 0 10*3/uL (ref 0.0–0.1)
BASOS PCT: 0 %
EOS ABS: 0 10*3/uL (ref 0.0–0.7)
EOS PCT: 0 %
HCT: 45.9 % (ref 39.0–52.0)
Hemoglobin: 15.4 g/dL (ref 13.0–17.0)
Lymphocytes Relative: 10 %
Lymphs Abs: 0.7 10*3/uL (ref 0.7–4.0)
MCH: 30.2 pg (ref 26.0–34.0)
MCHC: 33.6 g/dL (ref 30.0–36.0)
MCV: 90 fL (ref 78.0–100.0)
Monocytes Absolute: 0.9 10*3/uL (ref 0.1–1.0)
Monocytes Relative: 13 %
Neutro Abs: 5.2 10*3/uL (ref 1.7–7.7)
Neutrophils Relative %: 77 %
PLATELETS: 114 10*3/uL — AB (ref 150–400)
RBC: 5.1 MIL/uL (ref 4.22–5.81)
RDW: 13.7 % (ref 11.5–15.5)
WBC: 6.7 10*3/uL (ref 4.0–10.5)

## 2018-01-13 LAB — BASIC METABOLIC PANEL
Anion gap: 13 (ref 5–15)
BUN: 56 — AB (ref 4–21)
BUN: 57 mg/dL — AB (ref 6–20)
CALCIUM: 9.1 mg/dL (ref 8.9–10.3)
CO2: 21 mmol/L — ABNORMAL LOW (ref 22–32)
CREATININE: 2.4 — AB (ref 0.6–1.3)
Chloride: 105 mmol/L (ref 101–111)
Creatinine, Ser: 2.29 mg/dL — ABNORMAL HIGH (ref 0.61–1.24)
GFR calc Af Amer: 26 mL/min — ABNORMAL LOW (ref >60)
GFR calc non Af Amer: 23 mL/min — ABNORMAL LOW (ref >60)
Glucose, Bld: 138 mg/dL — ABNORMAL HIGH (ref 65–99)
Glucose: 71
POTASSIUM: 4.4 (ref 3.4–5.3)
POTASSIUM: 3.8 mmol/L (ref 3.5–5.1)
SODIUM: 139 mmol/L (ref 135–145)
Sodium: 143 (ref 137–147)

## 2018-01-13 LAB — TROPONIN I: Troponin I: 0.03 ng/mL (ref <0.03)

## 2018-01-13 LAB — INFLUENZA PANEL BY PCR (TYPE A & B)
INFLAPCR: NEGATIVE
INFLBPCR: NEGATIVE

## 2018-01-13 LAB — BRAIN NATRIURETIC PEPTIDE: B NATRIURETIC PEPTIDE 5: 1023.7 pg/mL — AB (ref 0.0–100.0)

## 2018-01-13 MED ORDER — IPRATROPIUM-ALBUTEROL 0.5-2.5 (3) MG/3ML IN SOLN
3.0000 mL | Freq: Once | RESPIRATORY_TRACT | Status: AC
  Administered 2018-01-13: 3 mL via RESPIRATORY_TRACT
  Filled 2018-01-13: qty 3

## 2018-01-13 MED ORDER — ACETAMINOPHEN 500 MG PO TABS
1000.0000 mg | ORAL_TABLET | Freq: Once | ORAL | Status: AC
  Administered 2018-01-13: 1000 mg via ORAL
  Filled 2018-01-13: qty 2

## 2018-01-13 MED ORDER — LORAZEPAM 2 MG/ML IJ SOLN: 1.0000 mg | INTRAMUSCULAR | Status: DC | PRN

## 2018-01-13 MED ORDER — AZITHROMYCIN 250 MG PO TABS
500.0000 mg | ORAL_TABLET | Freq: Once | ORAL | Status: AC
  Administered 2018-01-13: 500 mg via ORAL
  Filled 2018-01-13: qty 2

## 2018-01-13 MED ORDER — AZITHROMYCIN 250 MG PO TABS: ORAL_TABLET | ORAL | 0 refills | Status: DC
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
## 2018-01-13 NOTE — ED Triage Notes (Signed)
Pt here from [facility] via EMS, c/o has fever 100.2 for a few days and feels SOB when getting out of bed. 95% room air, denies N/V. BO 166/72, 70 HR. CBG 196.

## 2018-01-13 NOTE — ED Notes (Signed)
PT[facility] called  

## 2018-01-13 NOTE — ED Notes (Signed)
Pt ambulated in hall with pulse ox and oxygen level stayed 96%

## 2018-01-24 ENCOUNTER — Non-Acute Institutional Stay (SKILLED_NURSING_FACILITY): Payer: Medicare HMO | Admitting: Adult Health

## 2018-01-24 ENCOUNTER — Encounter: Payer: Self-pay | Admitting: Adult Health

## 2018-01-24 DIAGNOSIS — I5042 Chronic combined systolic (congestive) and diastolic (congestive) heart failure: Secondary | ICD-10-CM

## 2018-01-24 DIAGNOSIS — E1151 Type 2 diabetes mellitus with diabetic peripheral angiopathy without gangrene: Secondary | ICD-10-CM | POA: Diagnosis not present

## 2018-01-24 DIAGNOSIS — I10 Essential (primary) hypertension: Secondary | ICD-10-CM | POA: Diagnosis not present

## 2018-01-24 DIAGNOSIS — N183 Chronic kidney disease, stage 3 unspecified: Secondary | ICD-10-CM

## 2018-01-24 DIAGNOSIS — I70209 Unspecified atherosclerosis of native arteries of extremities, unspecified extremity: Secondary | ICD-10-CM

## 2018-01-24 DIAGNOSIS — I4819 Other persistent atrial fibrillation: Secondary | ICD-10-CM

## 2018-01-24 DIAGNOSIS — E7800 Pure hypercholesterolemia, unspecified: Secondary | ICD-10-CM

## 2018-01-24 DIAGNOSIS — K219 Gastro-esophageal reflux disease without esophagitis: Secondary | ICD-10-CM

## 2018-01-24 DIAGNOSIS — N400 Enlarged prostate without lower urinary tract symptoms: Secondary | ICD-10-CM | POA: Diagnosis not present

## 2018-01-24 DIAGNOSIS — I481 Persistent atrial fibrillation: Secondary | ICD-10-CM

## 2018-01-24 NOTE — Progress Notes (Signed)
Location:  [facility] Room Number: 628 BTDVV of Service:  SNF (31) Provider:  Durenda Age, NP  Patient Care Team: [name], MD as PCP - General (Internal Medicine) Medina-Vargas, Senaida Lange, NP as Nurse Practitioner (Internal Medicine)  Extended Emergency Contact Information Primary Emergency Contact: [name] [place] Phone: [phone] Mobile Phone: [phone] Relation: Son Secondary Emergency Contact: [name] [place] Phone: [phone] Mobile Phone: [phone] Relation: Daughter  Code Status:  Full Code  Goals of care: Advanced Directive information Advanced Directives 01/13/2018  Does Patient Have a Medical Advance Directive? No  Type of Advance Directive -  Does patient want to make changes to medical advance directive? -  Copy of [place] in Chart? -  Would patient like information on creating a medical advance directive? No - Patient declined     Chief Complaint  Patient presents with  . Medical Management of Chronic Issues    Routine [facility] visit    HPI:  Pt is a 84 y.o. male seen today for medical management of chronic diseases.  He is a long-term care resident of [facility].  He has a PMH of HTN, DM with HLD, and CAD status post CABG. He was recently transferred to the hospital for SOB which was thought to be due to Bronchitis. He completed antibiotic treatment.    Past Medical History:  Diagnosis Date  . Actinic keratitis   . CHF (congestive heart failure) ([place])   . Claudication ([place])   . Coronary artery disease    status post CABG, 1999  . Diabetes mellitus    Uncontrolled secondary to dietary noncompliance  . Diverticulosis   . DJD (degenerative joint disease)   . Hypertension    hypertensive syndrome with dyspnea -[facility], February, 2011 - EF 50% and cardiac bypass grafts all  patent - responded to diuretics and blood pressure control - [name]  . Microscopic hematuria   . Onychomycosis   . Pneumonia 05/2017  . Prostate nodule   . PSVT (paroxysmal supraventricular tachycardia) ([place])   . Renal disorder   . UTI (urinary tract infection) 09/02/2017   sensitive to Ceftriaxone  . Vitamin B12 deficiency   . Vitamin D deficiency    Past Surgical History:  Procedure Laterality Date  . APPENDECTOMY    . CORONARY ARTERY BYPASS GRAFT      No Known Allergies  Outpatient Encounter Medications as of 01/24/2018  Medication Sig  . acetaminophen (TYLENOL) 325 MG tablet Take 650 mg by mouth every 6 (six) hours as needed for mild pain.  Marland Kitchen amLODipine (NORVASC) 5 MG tablet Take 5 mg by mouth daily.  Marland Kitchen aspirin EC 81 MG EC tablet Take 1 tablet (81 mg total) by mouth daily.  . bisacodyl (DULCOLAX) 10 MG suppository Place 10 mg rectally once as needed (FOR CONSTIPATION NOT RELIEVED BY MILK OF MAGNESIA).  Marland Kitchen cholecalciferol (VITAMIN D) 1000 units tablet Take 1,000 Units by mouth daily.  . Fluticasone Propionate (FLONASE ALLERGY RELIEF NA) Place 2 sprays into the nose at bedtime. Each nostril for allergic rhinitis  . furosemide (LASIX) 80 MG tablet Take 1 tablet (80 mg total) by mouth daily.  . hydrALAZINE (APRESOLINE) 10 MG tablet Take 10 mg by mouth 2 (two) times daily.  . insulin aspart (NOVOLOG) 100 UNIT/ML injection Inject 10-12 Units into the skin 3 (three) times daily before meals. Inject 10 units before each meal,  inject an additional 2 units if CBG is over 300  . Insulin Glargine (BASAGLAR KWIKPEN) 100 UNIT/ML SOPN Inject 14 Units into the skin at bedtime.  Marland Kitchen ipratropium-albuterol (DUONEB) 0.5-2.5 (3) MG/3ML SOLN Take 3 mLs by nebulization every 6 (six) hours as needed.  . isosorbide mononitrate (IMDUR) 30 MG 24 hr tablet Take 30 mg by mouth daily.   Marland Kitchen latanoprost (XALATAN) 0.005 % ophthalmic solution Place 1 drop into both eyes every evening.   . magnesium  hydroxide (MILK OF MAGNESIA) 400 MG/5ML suspension Take 5 mLs by mouth daily as needed for mild constipation.  . metoprolol (LOPRESSOR) 50 MG tablet TAKE 1 TABLET (50 MG TOTAL) BY MOUTH 2 (TWO) TIMES DAILY.  . Multiple Vitamin (MULTIVITAMIN) tablet Take 1 tablet by mouth daily.  Marland Kitchen Neomycin-Bacitracin-Polymyxin (TRIPLE ANTIBIOTIC EX) Apply 1 application topically daily. Apply to right nostril  . nitroGLYCERIN (NITROSTAT) 0.4 MG SL tablet Place 0.4 mg under the tongue every 5 (five) minutes x 3 doses as needed for chest pain.   . pantoprazole (PROTONIX) 40 MG tablet Take 40 mg by mouth 2 (two) times daily.  . potassium chloride SA (K-DUR,KLOR-CON) 20 MEQ tablet Take 1 tablet (20 mEq total) by mouth 2 (two) times daily.  . rosuvastatin (CRESTOR) 10 MG tablet Take 10 mg by mouth. M-W-F  . sennosides-docusate sodium (SENOKOT-S) 8.6-50 MG tablet Take 2 tablets by mouth 2 (two) times daily.   . tamsulosin (FLOMAX) 0.4 MG CAPS capsule Take 0.4 mg by mouth at bedtime.   . vitamin B-12 (CYANOCOBALAMIN) 1000 MCG tablet Take 1,000 mcg by mouth daily.   . [DISCONTINUED] azithromycin (ZITHROMAX) 250 MG tablet Take 1 every day until finished.   No facility-administered encounter medications on file as of 01/24/2018.     Review of Systems  GENERAL: No change in appetite, no fatigue, no weight changes, no fever, chills or weakness MOUTH and THROAT: Denies oral discomfort, gingival pain or bleeding RESPIRATORY: no cough, SOB, DOE, wheezing, hemoptysis CARDIAC: No chest pain,  or palpitations GI: No abdominal pain, diarrhea, constipation, heart burn, nausea or vomiting GU: Denies dysuria, frequency, hematuria, incontinence, or discharge PSYCHIATRIC: Denies feelings of depression or anxiety. No report of hallucinations, insomnia, paranoia, or agitation   Immunization History  Administered Date(s) Administered  . Influenza-Unspecified 08/06/2015, 09/03/2016, 09/02/2017  . PPD Test 09/20/2015  . Pneumococcal  Polysaccharide-23 08/06/2015  . Pneumococcal-Unspecified 09/03/2016  . Tdap 07/28/2015   Pertinent  Health Maintenance Due  Topic Date Due  . OPHTHALMOLOGY EXAM  09/28/1930  . PNA vac Low Risk Adult (2 of 2 - PCV13) 01/10/2019 (Originally 09/03/2017)  . URINE MICROALBUMIN  11/17/2023 (Originally 12/10/2016)  . HEMOGLOBIN A1C  06/16/2018  . FOOT EXAM  12/17/2018  . INFLUENZA VACCINE  Completed   Fall Risk  07/20/2017 01/05/2017 09/09/2016 08/24/2016 08/07/2016  Falls in the past year? No No No No No      Vitals:   01/24/18 1348  BP: 139/83  Pulse: 62  Resp: 18  Temp: (!) 97 F (36.1 C)  TempSrc: Oral  SpO2: 97%  Weight: 198 lb 6.4 oz (90 kg)  Height: 5\' 8"  (1.727 m)   Body mass index is 30.17 kg/m.  Physical Exam  GENERAL APPEARANCE: Well nourished. In no acute distress. Obese SKIN:  Skin is warm and dry.  MOUTH and THROAT: Lips are without lesions. Oral mucosa is moist and without lesions. Tongue is normal in shape, size, and color and without lesions RESPIRATORY: Breathing is even & unlabored, BS CTAB  CARDIAC: RRR, no murmur,no extra heart sounds, LLE2+ edema, RLE 1+edema GI: Abdomen soft, normal BS, no masses, no tenderness EXTREMITIES:  Able to move X 4 extremities PSYCHIATRIC: Alert and oriented X 3. Affect and behavior are appropriate   Labs reviewed: Recent Labs    06/11/17 1907  06/23/17 0614 06/23/17 1441  06/25/17 0315 06/26/17 0416  10/11/17 1520  01/11/18 01/13/18 01/13/18 1839  NA 127*   < >  --  138   < > 138 139   < > 139   < > 140 143 139  K 5.0   < >  --  4.0   < > 3.6 3.8   < > 4.7   < > 4.7 4.4 3.8  CL 93*   < >  --  106   < > 105 104  --  100  --   --   --  105  CO2 22   < >  --  21*   < > 24 24  --  20  --   --   --  21*  GLUCOSE 275*   < >  --  195*   < > 139* 105*  --  225*  --   --   --  138*  BUN 127*   < >  --  41*   < > 30* 29*   < > 51*   < > 58* 56* 57*  CREATININE 4.14*   < >  --  2.11*   < > 1.87* 1.86*   < > 2.07*   < > 2.4* 2.4*  2.29*  CALCIUM 8.9   < >  --  8.6*   < > 8.7* 8.8*  --  9.0  --   --   --  9.1  MG 1.7  --  1.9 1.8  --   --   --   --   --   --   --   --   --   PHOS  --   --  4.0  --   --  3.4 3.8  --   --   --   --   --   --    < > = values in this interval not displayed.   Recent Labs    06/22/17 1608 06/23/17 0108 06/23/17 1441 06/25/17 0315 06/26/17 0416 07/01/17  AST 27 23 22   --   --  13*  ALT 26 23 25   --   --  19  ALKPHOS 70 65 72  --   --  67  BILITOT 1.4* 1.5* 1.3*  --   --   --   PROT 7.0 6.4* 6.6  --   --   --   ALBUMIN 3.2* 3.0* 2.9* 3.0* 2.9*  --    Recent Labs    06/23/17 1441  06/26/17 0416 07/01/17 09/01/17 10/11/17 1520 01/13/18 1839  WBC 9.5   < > 6.9 8.6 9.6 8.4 6.7  NEUTROABS 7.3  --   --  6  --   --  5.2  HGB 9.4*   < > 9.6* 10.7* 9.4* 10.5* 15.4  HCT 28.1*   < > 29.1* 32* 28* 32.3* 45.9  MCV 87.5   < > 86.4  --   --  90 90.0  PLT 177   < > 237 262 211 229 114*   < > = values in this interval not displayed.   Lab Results  Component Value Date  TSH 1.266 06/23/2017   Lab Results  Component Value Date   HGBA1C 8.4 12/17/2017   Lab Results  Component Value Date   CHOL 181 06/19/2017   HDL 49 06/19/2017   LDLCALC 117 06/19/2017   TRIG 75 06/19/2017   CHOLHDL 2.5 01/01/2010    Significant Diagnostic Results in last 30 days:  Dg Chest 2 View  Result Date: 01/13/2018 CLINICAL DATA:  Fever, shortness of Breath EXAM: CHEST  2 VIEW COMPARISON:  06/2017 FINDINGS: Prior CABG. Cardiomegaly. Lingular scarring. No acute confluent airspace opacities or effusions. No overt edema. No acute bony abnormality. IMPRESSION: Cardiomegaly.  Lingular scarring.  No active disease. Electronically Signed   By: Rolm Baptise M.D.   On: 01/13/2018 19:31    Assessment/Plan  1. Persistent atrial fibrillation (HCC) - rate-controlled, continue aspirating EC 81 mg 1 tab daily   2. Benign prostatic hyperplasia without lower urinary tract symptoms - continue tamsulosin 0.4 mg 1  capsule daily at bedtime   3. CKD (chronic kidney disease), stage III (HCC) - will monitor Lab Results  Component Value Date   CREATININE 2.29 (H) 01/13/2018     4. Gastroesophageal reflux disease without esophagitis - continue pantoprazole 40 mg 1 tab twice a day   5. Pure hypercholesterolemia - continue rosuvastatin 10 mg 1 tab daily at bedtime   6. Atherosclerotic peripheral vascular disease (HCC) - no chest pain, continue isosorbide MN ER 30 mg 1 tab daily, aspirin EC 81 mg 1 tab daily, rosuvastatin 10 mg 1 tab every Monday, Wednesday and Friday, NTG when necessary   7. 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 Hegar 100 units/mL inject 14 units subcutaneous daily at bedtime, NovoLog 100 units/mL give 10 units subcutaneous before each meal and 2 units if CBG is over 300 Lab Results  Component Value Date   HGBA1C 8.4 12/17/2017     8. Chronic combined systolic and diastolic heart failure - no SOB, continue Lasix 80 mg 1 tab daily, hydralazine 10 mg 1 tab twice a day, metoprolol oral tartrate 50 mg 1 tab twice a day  9. Hypertension - well-controlled, continue amlodipine 5 mg 1 tab daily, hydralazine 10 mg 1 tab twice a day, metoprolol oral tartrate 50 mg 1 tab twice a day     Family/ staff Communication:  Discussed plan of care with resident.  Labs/tests ordered:  None  Goals of care:   Long-term care.   Durenda Age, NP [facility] [phone] (Monday-Friday 8:00 a.m. - 5:00 p.m.) [phone] (after hours)
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
## 2018-01-26 ENCOUNTER — Ambulatory Visit (INDEPENDENT_AMBULATORY_CARE_PROVIDER_SITE_OTHER): Payer: Medicare HMO | Admitting: Interventional Cardiology

## 2018-01-26 ENCOUNTER — Encounter: Payer: Self-pay | Admitting: Interventional Cardiology

## 2018-01-26 VITALS — BP 146/70 | HR 62 | Ht 68.0 in | Wt 199.1 lb

## 2018-01-26 DIAGNOSIS — I4819 Other persistent atrial fibrillation: Secondary | ICD-10-CM

## 2018-01-26 DIAGNOSIS — I25709 Atherosclerosis of coronary artery bypass graft(s), unspecified, with unspecified angina pectoris: Secondary | ICD-10-CM | POA: Diagnosis not present

## 2018-01-26 DIAGNOSIS — I70209 Unspecified atherosclerosis of native arteries of extremities, unspecified extremity: Secondary | ICD-10-CM

## 2018-01-26 DIAGNOSIS — I5042 Chronic combined systolic (congestive) and diastolic (congestive) heart failure: Secondary | ICD-10-CM

## 2018-01-26 DIAGNOSIS — I481 Persistent atrial fibrillation: Secondary | ICD-10-CM | POA: Diagnosis not present

## 2018-01-26 DIAGNOSIS — N183 Chronic kidney disease, stage 3 unspecified: Secondary | ICD-10-CM

## 2018-01-26 NOTE — Patient Instructions (Addendum)
Medication Instructions:  Your physician recommends that you continue on your current medications as directed. Please refer to the Current Medication list given to you today.  Labwork: You will need to have a BMET and BNP drawn at [facility] at the end of March.  Testing/Procedures: None  Follow-Up: Your physician recommends that you schedule a follow-up appointment in: 2 months with Truitt Merle, NP.  Your physician wants you to follow-up in: 4 months with Dr. Tamala Julian.  You will receive a reminder letter in the mail two months in advance. If you don't receive a letter, please call our office to schedule the follow-up appointment.    Any Other Special Instructions Will Be Listed Below (If Applicable).     If you need a refill on your cardiac medications before your next appointment, please call your pharmacy.

## 2018-01-31 DIAGNOSIS — E119 Type 2 diabetes mellitus without complications: Secondary | ICD-10-CM | POA: Diagnosis not present

## 2018-01-31 DIAGNOSIS — H353132 Nonexudative age-related macular degeneration, bilateral, intermediate dry stage: Secondary | ICD-10-CM | POA: Diagnosis not present

## 2018-01-31 DIAGNOSIS — H401212 Low-tension glaucoma, right eye, moderate stage: Secondary | ICD-10-CM | POA: Diagnosis not present

## 2018-01-31 DIAGNOSIS — H524 Presbyopia: Secondary | ICD-10-CM | POA: Diagnosis not present

## 2018-01-31 LAB — HM DIABETES EYE EXAM

## 2018-02-01 ENCOUNTER — Encounter: Payer: Self-pay | Admitting: Endocrinology

## 2018-02-03 ENCOUNTER — Ambulatory Visit (INDEPENDENT_AMBULATORY_CARE_PROVIDER_SITE_OTHER): Payer: Medicare HMO | Admitting: Podiatry

## 2018-02-03 DIAGNOSIS — B351 Tinea unguium: Secondary | ICD-10-CM | POA: Diagnosis not present

## 2018-02-03 DIAGNOSIS — E1151 Type 2 diabetes mellitus with diabetic peripheral angiopathy without gangrene: Secondary | ICD-10-CM

## 2018-02-08 ENCOUNTER — Encounter: Payer: Self-pay | Admitting: Adult Health

## 2018-02-08 ENCOUNTER — Non-Acute Institutional Stay (SKILLED_NURSING_FACILITY): Payer: Medicare HMO | Admitting: Adult Health

## 2018-02-08 DIAGNOSIS — K219 Gastro-esophageal reflux disease without esophagitis: Secondary | ICD-10-CM | POA: Diagnosis not present

## 2018-02-08 DIAGNOSIS — N184 Chronic kidney disease, stage 4 (severe): Secondary | ICD-10-CM | POA: Diagnosis not present

## 2018-02-08 DIAGNOSIS — I5042 Chronic combined systolic (congestive) and diastolic (congestive) heart failure: Secondary | ICD-10-CM | POA: Diagnosis not present

## 2018-02-08 NOTE — Progress Notes (Signed)
Location:  [facility] Room Number: 323-A Place of Service:  SNF (31) Provider:  Durenda Age, NP  Patient Care Team: [name], MD as PCP - General (Internal Medicine) Medina-Vargas, Senaida Lange, NP as Nurse Practitioner (Internal Medicine)  Extended Emergency Contact Information Primary Emergency Contact: [name] [place] of [place] Phone: [phone] Mobile Phone: [phone] Relation: Son Secondary Emergency Contact: [name] [place] of [place] Phone: [phone] Mobile Phone: [phone] Relation: Daughter  Code Status:  Full Code  Goals of care: Advanced Directive information Advanced Directives 01/13/2018  Does Patient Have a Medical Advance Directive? No  Type of Advance Directive -  Does patient want to make changes to medical advance directive? -  Copy of [place] in Chart? -  Would patient like information on creating a medical advance directive? No - Patient declined     Chief Complaint  Patient presents with  . Acute Visit    Patient has bilateral lower extremity edema.    HPI:  Pt is a 84 y.o. male seen today for an acute visit secondary to bilateral lower extremity edema.  He is a long-term care resident of [facility].  He has a PMH of hypertension, diabetes with HLD, and CAD status post CABG. Latest weight is 200.4 lbs and has gained  3 lbs in 2 days. He has an enlarged abdomen, tight, and lower extremity edema. He said that he becomes short of breath when he walks short distances. He went out today and had a haircut. Pharmacy recommended to have Pantoprazole tapered down. Resident takes Pantoprazole for GERD and currently on 40 mg BID.   Past Medical History:  Diagnosis Date  . Actinic keratitis   . CHF (congestive heart failure) ([place])   . Claudication ([place])   . Coronary artery disease    status post CABG, 1999  . Diabetes mellitus      Uncontrolled secondary to dietary noncompliance  . Diverticulosis   . DJD (degenerative joint disease)   . Hypertension    hypertensive syndrome with dyspnea -[facility], February, 2011 - EF 50% and cardiac bypass grafts all patent - responded to diuretics and blood pressure control - [name]  . Microscopic hematuria   . Onychomycosis   . Pneumonia 05/2017  . Prostate nodule   . PSVT (paroxysmal supraventricular tachycardia) ([place])   . Renal disorder   . UTI (urinary tract infection) 09/02/2017   sensitive to Ceftriaxone  . Vitamin B12 deficiency   . Vitamin D deficiency    Past Surgical History:  Procedure Laterality Date  . APPENDECTOMY    . CORONARY ARTERY BYPASS GRAFT      No Known Allergies  Outpatient Encounter Medications as of 02/08/2018  Medication Sig  . acetaminophen (TYLENOL) 325 MG tablet Take 650 mg by mouth every 6 (six) hours as needed for mild pain.  Marland Kitchen amLODipine (NORVASC) 5 MG tablet Take 5 mg by mouth daily.  Marland Kitchen aspirin EC 81 MG EC tablet Take 1 tablet (81 mg total) by mouth daily.  . bisacodyl (DULCOLAX) 10 MG suppository Place 10 mg rectally once as needed (FOR CONSTIPATION NOT RELIEVED BY MILK OF MAGNESIA).  Marland Kitchen cholecalciferol (VITAMIN D) 1000 units tablet Take 1,000 Units by mouth daily.  . Fluticasone Propionate (FLONASE ALLERGY RELIEF NA) Place 2 sprays into the nose at bedtime. Each nostril for allergic rhinitis  . furosemide (LASIX) 80 MG tablet Take 1 tablet (80 mg total)  by mouth daily.  . hydrALAZINE (APRESOLINE) 10 MG tablet Take 10 mg by mouth 2 (two) times daily.  . hydrocortisone cream 0.5 % Apply 1 application topically 2 (two) times daily. Apply to rash on right foot until healed  . insulin aspart (NOVOLOG) 100 UNIT/ML injection Inject 10-12 Units into the skin 3 (three) times daily before meals. Inject 10 units before each meal, inject an additional 2 units if CBG is over 300  . Insulin Glargine (BASAGLAR KWIKPEN) 100  UNIT/ML SOPN Inject 14 Units into the skin at bedtime.  Marland Kitchen ipratropium-albuterol (DUONEB) 0.5-2.5 (3) MG/3ML SOLN Take 3 mLs by nebulization every 6 (six) hours as needed.  . isosorbide mononitrate (IMDUR) 30 MG 24 hr tablet Take 30 mg by mouth daily.   Marland Kitchen latanoprost (XALATAN) 0.005 % ophthalmic solution Place 1 drop into both eyes every evening.   . magnesium hydroxide (MILK OF MAGNESIA) 400 MG/5ML suspension Take 5 mLs by mouth daily as needed for mild constipation.  . metoprolol (LOPRESSOR) 50 MG tablet TAKE 1 TABLET (50 MG TOTAL) BY MOUTH 2 (TWO) TIMES DAILY.  . Multiple Vitamin (MULTIVITAMIN) tablet Take 1 tablet by mouth daily.  Marland Kitchen Neomycin-Bacitracin-Polymyxin (TRIPLE ANTIBIOTIC EX) Apply 1 application topically daily. Apply to right nostril and all open areas on bilateral toes  . nitroGLYCERIN (NITROSTAT) 0.4 MG SL tablet Place 0.4 mg under the tongue every 5 (five) minutes x 3 doses as needed for chest pain.   . potassium chloride SA (K-DUR,KLOR-CON) 20 MEQ tablet Take 1 tablet (20 mEq total) by mouth 2 (two) times daily.  . rosuvastatin (CRESTOR) 10 MG tablet Take 10 mg by mouth. M-W-F  . sennosides-docusate sodium (SENOKOT-S) 8.6-50 MG tablet Take 2 tablets by mouth 2 (two) times daily.   . tamsulosin (FLOMAX) 0.4 MG CAPS capsule Take 0.4 mg by mouth at bedtime.   . vitamin B-12 (CYANOCOBALAMIN) 1000 MCG tablet Take 1,000 mcg by mouth daily.   . [DISCONTINUED] pantoprazole (PROTONIX) 40 MG tablet Take 40 mg by mouth 2 (two) times daily.   No facility-administered encounter medications on file as of 02/08/2018.     Review of Systems  GENERAL: No change in appetite, no fatigue, no fever, chills or weakness MOUTH and THROAT: Denies oral discomfort, gingival pain or bleeding RESPIRATORY: no cough, SOB, DOE, wheezing, hemoptysis CARDIAC: No chest pain, or palpitations GI: No abdominal pain, diarrhea, constipation, heart burn, nausea or vomiting PSYCHIATRIC: Denies feelings of depression  or anxiety. No report of hallucinations, insomnia, paranoia, or agitation   Immunization History  Administered Date(s) Administered  . Influenza-Unspecified 08/06/2015, 09/03/2016, 09/02/2017  . PPD Test 09/20/2015  . Pneumococcal Polysaccharide-23 08/06/2015  . Pneumococcal-Unspecified 09/03/2016  . Tdap 07/28/2015   Pertinent  Health Maintenance Due  Topic Date Due  . PNA vac Low Risk Adult (2 of 2 - PCV13) 01/10/2019 (Originally 09/03/2017)  . URINE MICROALBUMIN  11/17/2023 (Originally 12/10/2016)  . HEMOGLOBIN A1C  06/16/2018  . FOOT EXAM  12/17/2018  . OPHTHALMOLOGY EXAM  02/01/2019  . INFLUENZA VACCINE  Completed   Fall Risk  07/20/2017 01/05/2017 09/09/2016 08/24/2016 08/07/2016  Falls in the past year? No No No No No     Vitals:   02/08/18 1019  BP: 134/76  Pulse: 73  Resp: 20  Temp: 98 F (36.7 C)  TempSrc: Oral  SpO2: 97%  Weight: 200 lb 6.4 oz (90.9 kg)  Height: 5\' 8"  (1.727 m)   Body mass index is 30.47 kg/m.  Physical Exam  GENERAL APPEARANCE: Well  nourished. In no acute distress. Obese SKIN:  Skin is warm and dry.  MOUTH and THROAT: Lips are without lesions. Oral mucosa is moist and without lesions. Tongue is normal in shape, size, and color and without lesions RESPIRATORY: Breathing is even & unlabored, BS CTAB CARDIAC: RRR, no murmur,no extra heart sounds, LLE 2+ edema, RLE 1+edema GI: Abdomen is enlarged and tight, normoactive bowel sounds EXTREMITIES: Able to move X 4 extremities  PSYCHIATRIC: Alert and oriented X 3. Affect and behavior are appropriate  Labs reviewed: Recent Labs    06/11/17 1907  06/23/17 0614 06/23/17 1441  06/25/17 0315 06/26/17 0416  10/11/17 1520  01/11/18 01/13/18 01/13/18 1839  NA 127*   < >  --  138   < > 138 139   < > 139   < > 140 143 139  K 5.0   < >  --  4.0   < > 3.6 3.8   < > 4.7   < > 4.7 4.4 3.8  CL 93*   < >  --  106   < > 105 104  --  100  --   --   --  105  CO2 22   < >  --  21*   < > 24 24  --  20  --    --   --  21*  GLUCOSE 275*   < >  --  195*   < > 139* 105*  --  225*  --   --   --  138*  BUN 127*   < >  --  41*   < > 30* 29*   < > 51*   < > 58* 56* 57*  CREATININE 4.14*   < >  --  2.11*   < > 1.87* 1.86*   < > 2.07*   < > 2.4* 2.4* 2.29*  CALCIUM 8.9   < >  --  8.6*   < > 8.7* 8.8*  --  9.0  --   --   --  9.1  MG 1.7  --  1.9 1.8  --   --   --   --   --   --   --   --   --   PHOS  --   --  4.0  --   --  3.4 3.8  --   --   --   --   --   --    < > = values in this interval not displayed.   Recent Labs    06/22/17 1608 06/23/17 0108 06/23/17 1441 06/25/17 0315 06/26/17 0416 07/01/17  AST 27 23 22   --   --  13*  ALT 26 23 25   --   --  19  ALKPHOS 70 65 72  --   --  67  BILITOT 1.4* 1.5* 1.3*  --   --   --   PROT 7.0 6.4* 6.6  --   --   --   ALBUMIN 3.2* 3.0* 2.9* 3.0* 2.9*  --    Recent Labs    06/23/17 1441  06/26/17 0416 07/01/17 09/01/17 10/11/17 1520 01/13/18 1839  WBC 9.5   < > 6.9 8.6 9.6 8.4 6.7  NEUTROABS 7.3  --   --  6  --   --  5.2  HGB 9.4*   < > 9.6* 10.7* 9.4* 10.5* 15.4  HCT 28.1*   < > 29.1* 32* 28* 32.3* 45.9  MCV 87.5   < > 86.4  --   --  90 90.0  PLT 177   < > 237 262 211 229 114*   < > = values in this interval not displayed.   Lab Results  Component Value Date   TSH 1.266 06/23/2017   Lab Results  Component Value Date   HGBA1C 8.4 12/17/2017   Lab Results  Component Value Date   CHOL 181 06/19/2017   HDL 49 06/19/2017   LDLCALC 117 06/19/2017   TRIG 75 06/19/2017   CHOLHDL 2.5 01/01/2010    Significant Diagnostic Results in last 30 days:  Dg Chest 2 View  Result Date: 01/13/2018 CLINICAL DATA:  Fever, shortness of Breath EXAM: CHEST  2 VIEW COMPARISON:  06/2017 FINDINGS: Prior CABG. Cardiomegaly. Lingular scarring. No acute confluent airspace opacities or effusions. No overt edema. No acute bony abnormality. IMPRESSION: Cardiomegaly.  Lingular scarring.  No active disease. Electronically Signed   By: Rolm Baptise M.D.   On: 01/13/2018  19:31    Assessment/Plan  1. Chronic combined systolic and diastolic CHF (congestive heart failure) ([place]) - will give extra dose of Lasix 40 mg PO today in addition to 80 mg daily   2. Chronic kidney disease, stage IV (severe) (HCC) - will check BMP Lab Results  Component Value Date   CREATININE 2.29 (H) 01/13/2018     3. Gastroesophageal reflux disease without esophagitis - will taper Pantoprazole 40 mg from BID to daily    Family/ staff Communication:  Discussed plan of care with resident and charge nurse.  Labs/tests ordered:  BMP  Goals of care:   Long-term care.   Durenda Age, NP [facility] [phone] (Monday-Friday 8:00 a.m. - 5:00 p.m.) [phone] (after hours)

## 2018-02-09 DIAGNOSIS — D649 Anemia, unspecified: Secondary | ICD-10-CM | POA: Diagnosis not present

## 2018-02-09 DIAGNOSIS — I130 Hypertensive heart and chronic kidney disease with heart failure and stage 1 through stage 4 chronic kidney disease, or unspecified chronic kidney disease: Secondary | ICD-10-CM | POA: Diagnosis not present

## 2018-02-10 DIAGNOSIS — I10 Essential (primary) hypertension: Secondary | ICD-10-CM | POA: Diagnosis not present

## 2018-02-10 DIAGNOSIS — E119 Type 2 diabetes mellitus without complications: Secondary | ICD-10-CM | POA: Diagnosis not present

## 2018-02-10 DIAGNOSIS — D649 Anemia, unspecified: Secondary | ICD-10-CM | POA: Diagnosis not present

## 2018-02-10 DIAGNOSIS — R0602 Shortness of breath: Secondary | ICD-10-CM | POA: Diagnosis not present

## 2018-02-10 DIAGNOSIS — N390 Urinary tract infection, site not specified: Secondary | ICD-10-CM | POA: Diagnosis not present

## 2018-02-10 LAB — BASIC METABOLIC PANEL
BUN: 46 — AB (ref 4–21)
Creatinine: 2 — AB (ref 0.6–1.3)
GLUCOSE: 99
POTASSIUM: 4.8 (ref 3.4–5.3)
SODIUM: 142 (ref 137–147)

## 2018-02-11 NOTE — Progress Notes (Addendum)
  Subjective:  Patient ID: Cole [name], male    DOB: 12-30-1919,  MRN: [id number]  Chief Complaint  Patient presents with  . Nail Problem    3 month debride  . Diabetes   84 y.o. male presents with the above complaint.  Reports painfully long nails he cannot care for himself.  Denies new issues.   Objective:  There were no vitals filed for this visit. General AA&O x3. Normal mood and affect.  Vascular Dorsalis pedis and posterior tibial pulses  present 2+ and absent bilaterally  Capillary refill normal to all digits. Pedal hair growth diminished.  Neurologic Epicritic sensation grossly present.  Dermatologic No open lesions. Interspaces clear of maceration. Nails well groomed and normal in appearance.   Orthopedic: MMT 5/5 in dorsiflexion, plantarflexion, inversion, and eversion. Normal joint ROM without pain or crepitus.   Assessment & Plan:  Patient was evaluated and treated and all questions answered.  Diabetes with PAD, Onychomycosis -Educated on diabetic footcare. Diabetic risk level 1 -Nails x10 debrided sharply and manually with large nail nipper and rotary burr.  Procedure: Nail Debridement Rationale: Patient meets criteria for routine foot care due to Class B findings Type of Debridement: manual, sharp debridement. Instrumentation: Nail nipper, rotary burr. Number of Nails: 10  VLU -Unna Boot applied.  Return in about 2 weeks (around 02/17/2018) for Wound Care.

## 2018-02-14 ENCOUNTER — Encounter: Payer: Self-pay | Admitting: Adult Health

## 2018-02-14 ENCOUNTER — Non-Acute Institutional Stay (SKILLED_NURSING_FACILITY): Payer: Medicare HMO | Admitting: Adult Health

## 2018-02-14 DIAGNOSIS — E538 Deficiency of other specified B group vitamins: Secondary | ICD-10-CM

## 2018-02-14 DIAGNOSIS — E559 Vitamin D deficiency, unspecified: Secondary | ICD-10-CM | POA: Diagnosis not present

## 2018-02-14 DIAGNOSIS — K219 Gastro-esophageal reflux disease without esophagitis: Secondary | ICD-10-CM | POA: Diagnosis not present

## 2018-02-14 DIAGNOSIS — E1129 Type 2 diabetes mellitus with other diabetic kidney complication: Secondary | ICD-10-CM | POA: Diagnosis not present

## 2018-02-14 DIAGNOSIS — I4819 Other persistent atrial fibrillation: Secondary | ICD-10-CM

## 2018-02-14 DIAGNOSIS — I70209 Unspecified atherosclerosis of native arteries of extremities, unspecified extremity: Secondary | ICD-10-CM

## 2018-02-14 DIAGNOSIS — E1165 Type 2 diabetes mellitus with hyperglycemia: Secondary | ICD-10-CM | POA: Diagnosis not present

## 2018-02-14 DIAGNOSIS — I110 Hypertensive heart disease with heart failure: Secondary | ICD-10-CM | POA: Diagnosis not present

## 2018-02-14 DIAGNOSIS — I481 Persistent atrial fibrillation: Secondary | ICD-10-CM

## 2018-02-14 DIAGNOSIS — N400 Enlarged prostate without lower urinary tract symptoms: Secondary | ICD-10-CM

## 2018-02-14 DIAGNOSIS — I5042 Chronic combined systolic (congestive) and diastolic (congestive) heart failure: Secondary | ICD-10-CM | POA: Diagnosis not present

## 2018-02-14 DIAGNOSIS — IMO0002 Reserved for concepts with insufficient information to code with codable children: Secondary | ICD-10-CM

## 2018-02-14 DIAGNOSIS — I5032 Chronic diastolic (congestive) heart failure: Secondary | ICD-10-CM | POA: Diagnosis not present

## 2018-02-14 NOTE — Progress Notes (Signed)
Location:  [place] Room Number: 323-A Place of Service:  SNF (31) Provider:  Durenda Age, NP  Patient Care Team: [name], MD as PCP - General (Internal Medicine) Medina-Vargas, Senaida Lange, NP as Nurse Practitioner (Internal Medicine)  Extended Emergency Contact Information Primary Emergency Contact: [name] States of [place] Phone: [phone] Mobile Phone: [phone] Relation: Son Secondary Emergency Contact: [name] [place] of [place] Phone: [phone] Mobile Phone: [phone] Relation: Daughter  Code Status:  Full Code  Goals of care: Advanced Directive information Advanced Directives 01/13/2018  Does Patient Have a Medical Advance Directive? No  Type of Advance Directive -  Does patient want to make changes to medical advance directive? -  Copy of [place] in Chart? -  Would patient like information on creating a medical advance directive? No - Patient declined     Chief Complaint  Patient presents with  . Medical Management of Chronic Issues    Routine [facility] visit    HPI:  Pt is a 84 y.o. male seen today for medical management of chronic diseases.  He is a long-term care resident of [facility].  He has a PMH of hypertension and is noncompliant with sodium restriction, diabetes with HLD, and CAD status post CABG. He was seen in his room today. He recently started having blisters on his left lower leg. Noted today that he has another blister on posterior left lower leg. Clear drainage noted. No erythema. Skin is cold to touch. He talked about his books that he has been reading. Latest weight is 202 lbs.    Past Medical History:  Diagnosis Date  . Actinic keratitis   . CHF (congestive heart failure) ([place])   . Claudication ([place])   . Coronary artery disease    status post CABG, 1999  . Diabetes mellitus    Uncontrolled secondary  to dietary noncompliance  . Diverticulosis   . DJD (degenerative joint disease)   . Hypertension    hypertensive syndrome with dyspnea -[facility], February, 2011 - EF 50% and cardiac bypass grafts all patent - responded to diuretics and blood pressure control - [name]  . Microscopic hematuria   . Onychomycosis   . Pneumonia 05/2017  . Prostate nodule   . PSVT (paroxysmal supraventricular tachycardia) ([place])   . Renal disorder   . UTI (urinary tract infection) 09/02/2017   sensitive to Ceftriaxone  . Vitamin B12 deficiency   . Vitamin D deficiency    Past Surgical History:  Procedure Laterality Date  . APPENDECTOMY    . CORONARY ARTERY BYPASS GRAFT      No Known Allergies  Outpatient Encounter Medications as of 02/14/2018  Medication Sig  . acetaminophen (TYLENOL) 325 MG tablet Take 650 mg by mouth every 6 (six) hours as needed for mild pain.  Marland Kitchen amLODipine (NORVASC) 5 MG tablet Take 5 mg by mouth daily.  Marland Kitchen aspirin EC 81 MG EC tablet Take 1 tablet (81 mg total) by mouth daily.  . bisacodyl (DULCOLAX) 10 MG suppository Place 10 mg rectally once as needed (FOR CONSTIPATION NOT RELIEVED BY MILK OF MAGNESIA).  Marland Kitchen cholecalciferol (VITAMIN D) 1000 units tablet Take 1,000 Units by mouth daily.  . Fluticasone Propionate (FLONASE ALLERGY RELIEF NA) Place 2 sprays into the nose at bedtime. Each nostril for allergic rhinitis  . furosemide (LASIX) 80 MG tablet Take 1 tablet (80 mg total) by mouth daily.  . hydrALAZINE (  APRESOLINE) 10 MG tablet Take 10 mg by mouth 2 (two) times daily.  . hydrocortisone cream 0.5 % Apply 1 application topically 2 (two) times daily. Apply to rash on right foot until healed  . insulin aspart (NOVOLOG) 100 UNIT/ML injection Inject 10-12 Units into the skin 3 (three) times daily before meals. Inject 10 units before each meal, inject an additional 2 units if CBG is over 300  . Insulin Glargine (BASAGLAR KWIKPEN) 100 UNIT/ML SOPN Inject 14 Units  into the skin at bedtime.  Marland Kitchen ipratropium-albuterol (DUONEB) 0.5-2.5 (3) MG/3ML SOLN Take 3 mLs by nebulization every 6 (six) hours as needed.  . isosorbide mononitrate (IMDUR) 30 MG 24 hr tablet Take 30 mg by mouth daily.   Marland Kitchen latanoprost (XALATAN) 0.005 % ophthalmic solution Place 1 drop into both eyes every evening.   . magnesium hydroxide (MILK OF MAGNESIA) 400 MG/5ML suspension Take 5 mLs by mouth daily as needed for mild constipation.  . metoprolol (LOPRESSOR) 50 MG tablet TAKE 1 TABLET (50 MG TOTAL) BY MOUTH 2 (TWO) TIMES DAILY.  . Multiple Vitamin (MULTIVITAMIN) tablet Take 1 tablet by mouth daily.  Marland Kitchen Neomycin-Bacitracin-Polymyxin (TRIPLE ANTIBIOTIC EX) Apply 1 application topically daily. Apply to right nostril and all open areas on bilateral toes  . nitroGLYCERIN (NITROSTAT) 0.4 MG SL tablet Place 0.4 mg under the tongue every 5 (five) minutes x 3 doses as needed for chest pain.   . pantoprazole (PROTONIX) 40 MG tablet Take 40 mg by mouth daily.  . potassium chloride SA (K-DUR,KLOR-CON) 20 MEQ tablet Take 1 tablet (20 mEq total) by mouth 2 (two) times daily.  . rosuvastatin (CRESTOR) 10 MG tablet Take 10 mg by mouth. M-W-F  . sennosides-docusate sodium (SENOKOT-S) 8.6-50 MG tablet Take 2 tablets by mouth 2 (two) times daily.   . tamsulosin (FLOMAX) 0.4 MG CAPS capsule Take 0.4 mg by mouth at bedtime.   . vitamin B-12 (CYANOCOBALAMIN) 1000 MCG tablet Take 1,000 mcg by mouth daily.    No facility-administered encounter medications on file as of 02/14/2018.     Review of Systems  GENERAL: No change in appetite, no fatigue, no weight changes, no fever, chills or weakness MOUTH and THROAT: Denies oral discomfort, gingival pain or bleeding, pain from teeth or hoarseness   RESPIRATORY: no cough, SOB, DOE, wheezing, hemoptysis CARDIAC: No chest pain, or palpitations GI: No abdominal pain, diarrhea, constipation, heart burn, nausea or vomiting GU: Denies dysuria, frequency, hematuria,  incontinence, or discharge PSYCHIATRIC: Denies feelings of depression or anxiety. No report of hallucinations, insomnia, paranoia, or agitation   Immunization History  Administered Date(s) Administered  . Influenza-Unspecified 08/06/2015, 09/03/2016, 09/02/2017  . PPD Test 09/20/2015  . Pneumococcal Polysaccharide-23 08/06/2015  . Pneumococcal-Unspecified 09/03/2016  . Tdap 07/28/2015   Pertinent  Health Maintenance Due  Topic Date Due  . PNA vac Low Risk Adult (2 of 2 - PCV13) 01/10/2019 (Originally 09/03/2017)  . URINE MICROALBUMIN  11/17/2023 (Originally 12/10/2016)  . INFLUENZA VACCINE  06/16/2018  . HEMOGLOBIN A1C  06/16/2018  . FOOT EXAM  12/17/2018  . OPHTHALMOLOGY EXAM  02/01/2019   Fall Risk  07/20/2017 01/05/2017 09/09/2016 08/24/2016 08/07/2016  Falls in the past year? No No No No No      Vitals:   02/14/18 1045  BP: 138/73  Pulse: 77  Resp: 20  Temp: 98.4 F (36.9 C)  TempSrc: Oral  SpO2: 97%  Weight: 202 lb (91.6 kg)  Height: 5\' 8"  (1.727 m)   Body mass index is 30.71 kg/m.  Physical Exam  GENERAL APPEARANCE: Well nourished. In no acute distress. Obese SKIN:  Anterior left lower leg has 2 open wounds and posterior left lower leg has blister MOUTH and THROAT: Lips are without lesions. Oral mucosa is moist and without lesions. Tongue is normal in shape, size, and color and without lesions RESPIRATORY: Breathing is even & unlabored, BS CTAB CARDIAC: RRR, no murmur,no extra heart sounds, LLE 2+ edema, RLE trace edema GI: Abdomen is enlarged and tight, normal BS, no masses EXTREMITIES:  Able to move X 4 extremities PSYCHIATRIC: Alert and oriented X 3. Affect and behavior are appropriate   Labs reviewed: Recent Labs    06/11/17 1907  06/23/17 0614 06/23/17 1441  06/25/17 0315 06/26/17 0416  10/11/17 1520  01/13/18 01/13/18 1839 02/10/18  NA 127*   < >  --  138   < > 138 139   < > 139   < > 143 139 142  K 5.0   < >  --  4.0   < > 3.6 3.8   < > 4.7   < >  4.4 3.8 4.8  CL 93*   < >  --  106   < > 105 104  --  100  --   --  105  --   CO2 22   < >  --  21*   < > 24 24  --  20  --   --  21*  --   GLUCOSE 275*   < >  --  195*   < > 139* 105*  --  225*  --   --  138*  --   BUN 127*   < >  --  41*   < > 30* 29*   < > 51*   < > 56* 57* 46*  CREATININE 4.14*   < >  --  2.11*   < > 1.87* 1.86*   < > 2.07*   < > 2.4* 2.29* 2.0*  CALCIUM 8.9   < >  --  8.6*   < > 8.7* 8.8*  --  9.0  --   --  9.1  --   MG 1.7  --  1.9 1.8  --   --   --   --   --   --   --   --   --   PHOS  --   --  4.0  --   --  3.4 3.8  --   --   --   --   --   --    < > = values in this interval not displayed.   Recent Labs    06/22/17 1608 06/23/17 0108 06/23/17 1441 06/25/17 0315 06/26/17 0416 07/01/17  AST 27 23 22   --   --  13*  ALT 26 23 25   --   --  19  ALKPHOS 70 65 72  --   --  67  BILITOT 1.4* 1.5* 1.3*  --   --   --   PROT 7.0 6.4* 6.6  --   --   --   ALBUMIN 3.2* 3.0* 2.9* 3.0* 2.9*  --    Recent Labs    06/23/17 1441  06/26/17 0416  07/01/17 09/01/17 10/11/17 1520 01/13/18 1839  WBC 9.5   < > 6.9   < > 8.6 9.6 8.4 6.7  NEUTROABS 7.3  --   --   --  6  --   --  5.2  HGB 9.4*   < > 9.6*  --  10.7* 9.4* 10.5* 15.4  HCT 28.1*   < > 29.1*  --  32* 28* 32.3* 45.9  MCV 87.5   < > 86.4  --   --   --  90 90.0  PLT 177   < > 237  --  262 211 229 114*   < > = values in this interval not displayed.   Lab Results  Component Value Date   TSH 1.266 06/23/2017   Lab Results  Component Value Date   HGBA1C 8.4 12/17/2017   Lab Results  Component Value Date   CHOL 181 06/19/2017   HDL 49 06/19/2017   LDLCALC 117 06/19/2017   TRIG 75 06/19/2017   CHOLHDL 2.5 01/01/2010    Assessment/Plan  1. Chronic combined systolic and diastolic CHF (congestive heart failure) (HCC) - latest weight 202 lbs, has been having blisters on his left lower leg, will give  Lasix 40 mg PO X 1  Today and continue Lasix 80 mg daily and KCL ER 20 meq BID, Hydralazine 10 mg BID, Metoprolol  tartrate 50 mg BID   2. Persistent atrial fibrillation (HCC) - rate-controlled, continue  Metoprolol tartrate 50 mg BID    3. Gastroesophageal reflux disease without esophagitis - continue Pantoprazole 40 mg daily   4. Vitamin D deficiency - continue Vitamin D3 1,000 units daily   5. B12 deficiency   6. Diabetes mellitus with renal manifestations, uncontrolled (HCC) - continue Basaglar  100 units/ml inject 14 units SQ Q HS, Novolog 100 units/ml inject 2 units SQ PRN for CBG >200, 10 units SQ before each meal Lab Results  Component Value Date   HGBA1C 8.4 12/17/2017     7. Atherosclerotic peripheral vascular disease (HCC) - con complaints of chest pain, continue Isosorbide MN ER 30 mg daily, Rosuvastatin 10 mg 1 tab Q MWF, NTG PRN   8. Hypertensive heart disease with chronic diastolic congestive heart failure (HCC) - stable, continue Amlodipine 5 mg daily, Hydralazine 10 mg BID, Metoprolol tartrate 50 mg BID   9. BPH -  Continue Flomax 0.4 mg 1 capsule daily     Family/ staff Communication: Discussed plan of care with resident.  Labs/tests ordered:  BMP on 02/15/18  Goals of care:   Long-term care.   Durenda Age, NP [facility] [phone] (Monday-Friday 8:00 a.m. - 5:00 p.m.) [phone] (after hours)

## 2018-02-15 DIAGNOSIS — D649 Anemia, unspecified: Secondary | ICD-10-CM | POA: Diagnosis not present

## 2018-02-15 DIAGNOSIS — I10 Essential (primary) hypertension: Secondary | ICD-10-CM | POA: Diagnosis not present

## 2018-02-15 LAB — BASIC METABOLIC PANEL
BUN: 50 — AB (ref 4–21)
Creatinine: 2.1 — AB (ref 0.6–1.3)
GLUCOSE: 95
POTASSIUM: 4.5 (ref 3.4–5.3)
Sodium: 140 (ref 137–147)

## 2018-02-18 ENCOUNTER — Ambulatory Visit: Payer: Medicare HMO | Admitting: Podiatry

## 2018-02-22 ENCOUNTER — Telehealth: Payer: Self-pay | Admitting: *Deleted

## 2018-02-22 NOTE — Telephone Encounter (Signed)
[facility] states they have no orders for the wraps to his legs and he told his dtr no one has looked at them.

## 2018-02-23 ENCOUNTER — Non-Acute Institutional Stay (SKILLED_NURSING_FACILITY): Payer: Medicare HMO | Admitting: Adult Health

## 2018-02-23 ENCOUNTER — Encounter: Payer: Self-pay | Admitting: Adult Health

## 2018-02-23 DIAGNOSIS — E1129 Type 2 diabetes mellitus with other diabetic kidney complication: Secondary | ICD-10-CM | POA: Diagnosis not present

## 2018-02-23 DIAGNOSIS — I481 Persistent atrial fibrillation: Secondary | ICD-10-CM

## 2018-02-23 DIAGNOSIS — N400 Enlarged prostate without lower urinary tract symptoms: Secondary | ICD-10-CM | POA: Diagnosis not present

## 2018-02-23 DIAGNOSIS — E1165 Type 2 diabetes mellitus with hyperglycemia: Secondary | ICD-10-CM

## 2018-02-23 DIAGNOSIS — I4819 Other persistent atrial fibrillation: Secondary | ICD-10-CM

## 2018-02-23 DIAGNOSIS — IMO0002 Reserved for concepts with insufficient information to code with codable children: Secondary | ICD-10-CM

## 2018-02-23 DIAGNOSIS — I25709 Atherosclerosis of coronary artery bypass graft(s), unspecified, with unspecified angina pectoris: Secondary | ICD-10-CM | POA: Diagnosis not present

## 2018-02-23 DIAGNOSIS — N184 Chronic kidney disease, stage 4 (severe): Secondary | ICD-10-CM | POA: Diagnosis not present

## 2018-02-23 DIAGNOSIS — I5042 Chronic combined systolic (congestive) and diastolic (congestive) heart failure: Secondary | ICD-10-CM | POA: Diagnosis not present

## 2018-02-23 NOTE — Progress Notes (Signed)
Location:  [facility] Room Number: 323-A Place of Service:  SNF (31) Provider:  Durenda Age, NP  Patient Care Team: [name], MD as PCP - General (Internal Medicine) Medina-Vargas, Senaida Lange, NP as Nurse Practitioner (Internal Medicine)  Extended Emergency Contact Information Primary Emergency Contact: [name] [place] Phone: [phone] Mobile Phone: [phone] Relation: Son Secondary Emergency Contact: [name] [place] Phone: [phone] Mobile Phone: [phone] Relation: Daughter  Code Status:  Full Code  Goals of care: Advanced Directive information Advanced Directives 01/13/2018  Does Patient Have a Medical Advance Directive? No  Type of Advance Directive -  Does patient want to make changes to medical advance directive? -  Copy of [place] in Chart? -  Would patient like information on creating a medical advance directive? No - Patient declined     Chief Complaint  Patient presents with  . Advanced Directive    Care plan meeting    HPI:  Pt is a 84 y.o. male seen today for a care plan meeting.  He is a long-term care resident of [facility].  He has a PMH of HTN and is noncompliant with sodium restriction, diabetes with HLD, and CAD status post CABG. Care plan  Meeting was attended by the social worker, resident and NP. He was noted to have  11 salt packets on his table. He said that he just want them on his table and does not use them. He continues to be full code. He was treated for bronchitis last month with Azithromycin. He follows-up with nephrology and cardiology. The meeting lasted X 20 minutes.    Past Medical History:  Diagnosis Date  . Actinic keratitis   . CHF (congestive heart failure) ([place])   . Claudication ([place])   . Coronary artery disease    status post CABG, 1999  . Diabetes mellitus    Uncontrolled  secondary to dietary noncompliance  . Diverticulosis   . DJD (degenerative joint disease)   . Hypertension    hypertensive syndrome with dyspnea -[facility], February, 2011 - EF 50% and cardiac bypass grafts all patent - responded to diuretics and blood pressure control - [name]  . Microscopic hematuria   . Onychomycosis   . Pneumonia 05/2017  . Prostate nodule   . PSVT (paroxysmal supraventricular tachycardia) ([place])   . Renal disorder   . UTI (urinary tract infection) 09/02/2017   sensitive to Ceftriaxone  . Vitamin B12 deficiency   . Vitamin D deficiency    Past Surgical History:  Procedure Laterality Date  . APPENDECTOMY    . CORONARY ARTERY BYPASS GRAFT      No Known Allergies  Outpatient Encounter Medications as of 02/23/2018  Medication Sig  . acetaminophen (TYLENOL) 325 MG tablet Take 650 mg by mouth every 6 (six) hours as needed for mild pain.  Marland Kitchen amLODipine (NORVASC) 5 MG tablet Take 5 mg by mouth daily.  Marland Kitchen aspirin EC 81 MG EC tablet Take 1 tablet (81 mg total) by mouth daily.  . bisacodyl (DULCOLAX) 10 MG suppository Place 10 mg rectally once as needed (FOR CONSTIPATION NOT RELIEVED BY MILK OF MAGNESIA).  Marland Kitchen cholecalciferol (VITAMIN D) 1000 units tablet Take 1,000 Units by mouth daily.  . Fluticasone Propionate (FLONASE ALLERGY RELIEF NA) Place 2 sprays into the nose at bedtime. Each nostril for allergic rhinitis  . furosemide (LASIX) 80 MG tablet Take 1 tablet (80  mg total) by mouth daily.  . hydrALAZINE (APRESOLINE) 10 MG tablet Take 10 mg by mouth 2 (two) times daily.  . hydrocortisone cream 0.5 % Apply 1 application topically 2 (two) times daily. Apply to rash on right foot until healed  . insulin aspart (NOVOLOG) 100 UNIT/ML injection Inject 10-12 Units into the skin 3 (three) times daily before meals. Inject 10 units before each meal, inject an additional 2 units if CBG is over 300  . Insulin Glargine (BASAGLAR KWIKPEN) 100 UNIT/ML SOPN  Inject 14 Units into the skin at bedtime.  Marland Kitchen ipratropium-albuterol (DUONEB) 0.5-2.5 (3) MG/3ML SOLN Take 3 mLs by nebulization every 6 (six) hours as needed.  . isosorbide mononitrate (IMDUR) 30 MG 24 hr tablet Take 30 mg by mouth daily.   Marland Kitchen latanoprost (XALATAN) 0.005 % ophthalmic solution Place 1 drop into both eyes every evening.   . magnesium hydroxide (MILK OF MAGNESIA) 400 MG/5ML suspension Take 5 mLs by mouth daily as needed for mild constipation.  . metoprolol (LOPRESSOR) 50 MG tablet TAKE 1 TABLET (50 MG TOTAL) BY MOUTH 2 (TWO) TIMES DAILY.  . Multiple Vitamin (MULTIVITAMIN) tablet Take 1 tablet by mouth daily.  Marland Kitchen Neomycin-Bacitracin-Polymyxin (TRIPLE ANTIBIOTIC EX) Apply 1 application topically daily. Apply to right nostril and all open areas on bilateral toes  . nitroGLYCERIN (NITROSTAT) 0.4 MG SL tablet Place 0.4 mg under the tongue every 5 (five) minutes x 3 doses as needed for chest pain.   . pantoprazole (PROTONIX) 40 MG tablet Take 40 mg by mouth daily.  . potassium chloride SA (K-DUR,KLOR-CON) 20 MEQ tablet Take 1 tablet (20 mEq total) by mouth 2 (two) times daily.  . rosuvastatin (CRESTOR) 10 MG tablet Take 10 mg by mouth. M-W-F  . sennosides-docusate sodium (SENOKOT-S) 8.6-50 MG tablet Take 2 tablets by mouth 2 (two) times daily.   . tamsulosin (FLOMAX) 0.4 MG CAPS capsule Take 0.4 mg by mouth at bedtime.   . vitamin B-12 (CYANOCOBALAMIN) 1000 MCG tablet Take 1,000 mcg by mouth daily.    No facility-administered encounter medications on file as of 02/23/2018.     Review of Systems  GENERAL: No change in appetite, no fatigue, no weight changes, no fever, chills or weakness MOUTH and THROAT: Denies oral discomfort, gingival pain or bleeding RESPIRATORY: no cough, SOB, DOE, wheezing, hemoptysis CARDIAC: No chest pain, or palpitations GI: No abdominal pain, diarrhea, constipation, heart burn, nausea or vomiting GU: Denies dysuria, frequency, hematuria, incontinence, or  discharge PSYCHIATRIC: Denies feelings of depression or anxiety. No report of hallucinations, insomnia, paranoia, or agitation    Immunization History  Administered Date(s) Administered  . Influenza-Unspecified 08/06/2015, 09/03/2016, 09/02/2017  . PPD Test 09/20/2015  . Pneumococcal Polysaccharide-23 08/06/2015  . Pneumococcal-Unspecified 09/03/2016  . Tdap 07/28/2015   Pertinent  Health Maintenance Due  Topic Date Due  . PNA vac Low Risk Adult (2 of 2 - PCV13) 01/10/2019 (Originally 09/03/2017)  . URINE MICROALBUMIN  11/17/2023 (Originally 12/10/2016)  . INFLUENZA VACCINE  06/16/2018  . HEMOGLOBIN A1C  06/16/2018  . FOOT EXAM  12/17/2018  . OPHTHALMOLOGY EXAM  02/01/2019   Fall Risk  07/20/2017 01/05/2017 09/09/2016 08/24/2016 08/07/2016  Falls in the past year? No No No No No     Vitals:   02/23/18 1159  BP: 122/71  Pulse: 62  Resp: 20  Temp: 98.8 F (37.1 C)  TempSrc: Oral  SpO2: 97%  Weight: 197 lb 4.8 oz (89.5 kg)  Height: 5\' 8"  (1.727 m)   Body mass index  is 30 kg/m.  Physical Exam  GENERAL APPEARANCE: Well nourished. In no acute distress.Obese SKIN:  Left lower leg with dressing MOUTH and THROAT: Lips are without lesions. Oral mucosa is moist and without lesions. Tongue is normal in shape, size, and color and without lesions RESPIRATORY: Breathing is even & unlabored, BS CTAB CARDIAC: RRR, no murmur,no extra heart sounds GI: Abdomen soft, normal BS, no masses, no tenderness EXTREMITIES:  Able to move X 4 extremities PSYCHIATRIC: Alert and oriented X 3. Affect and behavior are appropriate   Labs reviewed: Recent Labs    06/11/17 1907  06/23/17 0614 06/23/17 1441  06/25/17 0315 06/26/17 0416  10/11/17 1520  01/13/18 1839 02/10/18 02/15/18  NA 127*   < >  --  138   < > 138 139   < > 139   < > 139 142 140  K 5.0   < >  --  4.0   < > 3.6 3.8   < > 4.7   < > 3.8 4.8 4.5  CL 93*   < >  --  106   < > 105 104  --  100  --  105  --   --   CO2 22   < >  --   21*   < > 24 24  --  20  --  21*  --   --   GLUCOSE 275*   < >  --  195*   < > 139* 105*  --  225*  --  138*  --   --   BUN 127*   < >  --  41*   < > 30* 29*   < > 51*   < > 57* 46* 50*  CREATININE 4.14*   < >  --  2.11*   < > 1.87* 1.86*   < > 2.07*   < > 2.29* 2.0* 2.1*  CALCIUM 8.9   < >  --  8.6*   < > 8.7* 8.8*  --  9.0  --  9.1  --   --   MG 1.7  --  1.9 1.8  --   --   --   --   --   --   --   --   --   PHOS  --   --  4.0  --   --  3.4 3.8  --   --   --   --   --   --    < > = values in this interval not displayed.   Recent Labs    06/22/17 1608 06/23/17 0108 06/23/17 1441 06/25/17 0315 06/26/17 0416 07/01/17  AST 27 23 22   --   --  13*  ALT 26 23 25   --   --  19  ALKPHOS 70 65 72  --   --  67  BILITOT 1.4* 1.5* 1.3*  --   --   --   PROT 7.0 6.4* 6.6  --   --   --   ALBUMIN 3.2* 3.0* 2.9* 3.0* 2.9*  --    Recent Labs    06/23/17 1441  06/26/17 0416  07/01/17 09/01/17 10/11/17 1520 01/13/18 1839  WBC 9.5   < > 6.9   < > 8.6 9.6 8.4 6.7  NEUTROABS 7.3  --   --   --  6  --   --  5.2  HGB 9.4*   < > 9.6*  --  10.7*  9.4* 10.5* 15.4  HCT 28.1*   < > 29.1*  --  32* 28* 32.3* 45.9  MCV 87.5   < > 86.4  --   --   --  90 90.0  PLT 177   < > 237  --  262 211 229 114*   < > = values in this interval not displayed.   Lab Results  Component Value Date   TSH 1.266 06/23/2017   Lab Results  Component Value Date   HGBA1C 8.4 12/17/2017   Lab Results  Component Value Date   CHOL 181 06/19/2017   HDL 49 06/19/2017   LDLCALC 117 06/19/2017   TRIG 75 06/19/2017   CHOLHDL 2.5 01/01/2010    Assessment/Plan  1. Persistent atrial fibrillation (HCC) - rate-controlled, continue Metoprolol tartrate 50 mg BID   2. Benign prostatic hyperplasia without lower urinary tract symptoms - no complaints of urinary retention, continue Tamsulosis 0.4 mg 1 capsule daily   3. Chronic kidney disease, stage 4 (severe) (HCC) - will monitor, followed-up by nephrology Lab Results  Component  Value Date   CREATININE 2.1 (A) 03/10/2018     4. Coronary artery disease involving coronary bypass graft of native heart with angina pectoris (HCC) - no complaints of chest pains, continue NTG PRN, ASA EC 81 mg daily, Isosorbide MN ER 30 mg daily, Rosuvastatin 10 mg Q HS on MWF   5. Chronic combined systolic and diastolic CHF (congestive heart failure) (HCC) - stable, continue Metoprolol tartrate 50 mg BID, Lasix 80 mg daily, Hydralazine 10 mg BID   6. Diabetes mellitus with renal manifestations, uncontrolled - continue Basaglar 100 units/ml inject 14 units SQ Q HS, Novolog 100 units/ml inject 10 units SQ TID with meals and additional 2 units  TID for CBG >300 Lab Results  Component Value Date   HGBA1C 8.4 12/17/2017      Family/ staff Communication: Discussed plan of care with resident and Education officer, museum.  Labs/tests ordered:  None  Goals of care:   Long-term care   Durenda Age, NP [facility] [phone] (Monday-Friday 8:00 a.m. - 5:00 p.m.) [phone] (after hours)

## 2018-02-23 NOTE — Telephone Encounter (Signed)
Dr March Rummage this is a [facility] patient seen on Friday can you please advise Val

## 2018-02-24 ENCOUNTER — Telehealth: Payer: Self-pay | Admitting: Interventional Cardiology

## 2018-02-24 DIAGNOSIS — D649 Anemia, unspecified: Secondary | ICD-10-CM | POA: Diagnosis not present

## 2018-02-24 DIAGNOSIS — I10 Essential (primary) hypertension: Secondary | ICD-10-CM | POA: Diagnosis not present

## 2018-02-24 LAB — BASIC METABOLIC PANEL
BUN: 45 — AB (ref 4–21)
CREATININE: 2.1 — AB (ref 0.6–1.3)
Glucose: 143
Potassium: 4.3 (ref 3.4–5.3)
Sodium: 140 (ref 137–147)

## 2018-02-24 NOTE — Telephone Encounter (Signed)
Pt had labs last week at [facility] and Dr. Tamala Julian ordered Metolazone 2.5mg  30 mins prior to his next 80mg  dose of Furosemide and repeat BMET in 1 week.  Lab was completed today and is now available in Epic.  Per Nursing home note dated 02/14/18, pt's weight was 202lbs.  Nursing home note dated 02/23/18 shows pt's weight to be 197lbs.  Will route message to Dr. Tamala Julian for review and advisement.

## 2018-02-25 NOTE — Telephone Encounter (Signed)
Basic metabolic panel is stable.  New order is for the patient to receive metolazone once per week, 2.5 mg 30 minutes prior the a.m. dose of furosemide.  Basic metabolic panel in 1 month.

## 2018-02-25 NOTE — Telephone Encounter (Signed)
Spoke with Dawn at [facility] and made her aware of Dr. Thompson Caul recommendations.  Dawn verbalized understanding and asked that I also fax orders to [phone].

## 2018-03-01 DIAGNOSIS — E119 Type 2 diabetes mellitus without complications: Secondary | ICD-10-CM | POA: Diagnosis not present

## 2018-03-01 DIAGNOSIS — D649 Anemia, unspecified: Secondary | ICD-10-CM | POA: Diagnosis not present

## 2018-03-01 LAB — BASIC METABOLIC PANEL
BUN: 49 — AB (ref 4–21)
Creatinine: 1.9 — AB (ref 0.6–1.3)
GLUCOSE: 167
POTASSIUM: 4 (ref 3.4–5.3)
SODIUM: 137 (ref 137–147)

## 2018-03-03 ENCOUNTER — Ambulatory Visit (INDEPENDENT_AMBULATORY_CARE_PROVIDER_SITE_OTHER): Payer: Medicare HMO | Admitting: Podiatry

## 2018-03-03 DIAGNOSIS — R600 Localized edema: Secondary | ICD-10-CM | POA: Diagnosis not present

## 2018-03-03 DIAGNOSIS — I83892 Varicose veins of left lower extremities with other complications: Secondary | ICD-10-CM

## 2018-03-03 DIAGNOSIS — L97929 Non-pressure chronic ulcer of unspecified part of left lower leg with unspecified severity: Secondary | ICD-10-CM | POA: Diagnosis not present

## 2018-03-03 DIAGNOSIS — I83029 Varicose veins of left lower extremity with ulcer of unspecified site: Secondary | ICD-10-CM

## 2018-03-03 DIAGNOSIS — R609 Edema, unspecified: Secondary | ICD-10-CM

## 2018-03-03 NOTE — Progress Notes (Signed)
  Subjective:  Patient ID: GABERIAL CADA, male    DOB: 1920-02-11,  MRN: [id number]  Chief Complaint  Patient presents with  . Diabetic Ulcer    left foot and lower leg look better - UNA boot removed in the office today   84 y.o. male returns for the above complaint. States the wounds are looking better on his leg   Objective:  There were no vitals filed for this visit. General AA&O x3. Normal mood and affect.  Vascular Pedal pulses palpable.  Neurologic Epicritic sensation grossly intact.  Dermatologic No open lesions. Skin normal texture and turgor. Early VLUs with skin breakdown. Superficial EDEMA LLE.  Orthopedic: No pain to palpation either foot.   Assessment & Plan:  Patient was evaluated and treated and all questions answered.  VLUs L Leg -Improving. Unna boot LLE with compressive dressing and [place]  Return in about 2 weeks (around 03/17/2018) for Wound Care.

## 2018-03-03 NOTE — Progress Notes (Signed)
03/01/18 

## 2018-03-10 DIAGNOSIS — I10 Essential (primary) hypertension: Secondary | ICD-10-CM | POA: Diagnosis not present

## 2018-03-10 DIAGNOSIS — R0989 Other specified symptoms and signs involving the circulatory and respiratory systems: Secondary | ICD-10-CM | POA: Diagnosis not present

## 2018-03-10 DIAGNOSIS — D649 Anemia, unspecified: Secondary | ICD-10-CM | POA: Diagnosis not present

## 2018-03-10 LAB — CBC AND DIFFERENTIAL
HEMATOCRIT: 32 — AB (ref 41–53)
HEMOGLOBIN: 10.6 — AB (ref 13.5–17.5)
Neutrophils Absolute: 7
PLATELETS: 191 (ref 150–399)
WBC: 8.9

## 2018-03-10 LAB — BASIC METABOLIC PANEL
BUN: 56 — AB (ref 4–21)
Creatinine: 2.1 — AB (ref 0.6–1.3)
GLUCOSE: 271
Potassium: 3.4 (ref 3.4–5.3)
Sodium: 136 — AB (ref 137–147)

## 2018-03-11 DIAGNOSIS — Z79899 Other long term (current) drug therapy: Secondary | ICD-10-CM | POA: Diagnosis not present

## 2018-03-11 DIAGNOSIS — R319 Hematuria, unspecified: Secondary | ICD-10-CM | POA: Diagnosis not present

## 2018-03-11 DIAGNOSIS — N390 Urinary tract infection, site not specified: Secondary | ICD-10-CM | POA: Diagnosis not present

## 2018-03-16 ENCOUNTER — Encounter: Payer: Self-pay | Admitting: Endocrinology

## 2018-03-16 ENCOUNTER — Ambulatory Visit (INDEPENDENT_AMBULATORY_CARE_PROVIDER_SITE_OTHER): Payer: Medicare HMO | Admitting: Endocrinology

## 2018-03-16 VITALS — BP 130/58 | HR 66 | Wt 197.0 lb

## 2018-03-16 DIAGNOSIS — E1151 Type 2 diabetes mellitus with diabetic peripheral angiopathy without gangrene: Secondary | ICD-10-CM

## 2018-03-16 DIAGNOSIS — I70209 Unspecified atherosclerosis of native arteries of extremities, unspecified extremity: Secondary | ICD-10-CM | POA: Diagnosis not present

## 2018-03-16 LAB — POCT GLYCOSYLATED HEMOGLOBIN (HGB A1C): HEMOGLOBIN A1C: 8.2

## 2018-03-16 MED ORDER — INSULIN ASPART 100 UNIT/ML ~~LOC~~ SOLN: 12.0000 [IU] | Freq: Three times a day (TID) | SUBCUTANEOUS | 11 refills | Status: DC

## 2018-03-16 MED ORDER — BASAGLAR KWIKPEN 100 UNIT/ML ~~LOC~~ SOPN: 12.0000 [IU] | PEN_INJECTOR | Freq: Every day | SUBCUTANEOUS | 11 refills | Status: DC

## 2018-03-16 NOTE — Patient Instructions (Addendum)
check resident's blood sugar 4 times a day: before the 3 meals, and at bedtime.  also check if you have symptoms of your blood sugar being too high or too low.  please keep a record of the readings and bring it to your next appointment here (or you can bring the meter itself).  You can write it on any piece of paper.  please call us sooner if your blood sugar goes below 70, or if most cbg's are over 200.  Take novolog, 12 units 3 times a day (with each meal), no matter what cbg is, and: Reduce basaglar to 12 units at bedtime.  Also, take extra novolog, 2 units for any cbg over 300.  Please come back for a follow-up appointment in 3 months.

## 2018-03-16 NOTE — Progress Notes (Signed)
Subjective:    Patient ID: Cole [name], male    DOB: 1919-12-13, 84 y.o.   MRN: [id number]  HPI Pt returns for f/u of diabetes mellitus:  DM type: Insulin-requiring type 2 Dx'ed: 7412 Complications: polyneuropathy, CAD, PAD, foot ulcers, and renal failure  Therapy: insulin since mid-2018  DKA: never Severe hypoglycemia: never.  Pancreatitis: never Pancreatic imaging: not mentioned on 2017 [place] Other: he takes MDI; he lives at [facility], where staff gives insulin and checks cbg; history is mostly from son, due to memory loss.  Interval history: I have reviewed cbg record.  It varies from 119-504.  It is in general higher as the day goes on.  pt states he feels well in general.   Past Medical History:  Diagnosis Date  . Actinic keratitis   . CHF (congestive heart failure) ([place])   . Claudication ([place])   . Coronary artery disease    status post CABG, 1999  . Diabetes mellitus    Uncontrolled secondary to dietary noncompliance  . Diverticulosis   . DJD (degenerative joint disease)   . Hypertension    hypertensive syndrome with dyspnea -[facility], February, 2011 - EF 50% and cardiac bypass grafts all patent - responded to diuretics and blood pressure control - [name]  . Microscopic hematuria   . Onychomycosis   . Pneumonia 05/2017  . Prostate nodule   . PSVT (paroxysmal supraventricular tachycardia) ([place])   . Renal disorder   . UTI (urinary tract infection) 09/02/2017   sensitive to Ceftriaxone  . Vitamin B12 deficiency   . Vitamin D deficiency     Past Surgical History:  Procedure Laterality Date  . APPENDECTOMY    . CORONARY ARTERY BYPASS GRAFT      Social History   Socioeconomic History  . Marital status: Married    Spouse name: Not on file  . Number of children: Not on file  . Years of education: Not on file  . Highest education level: Not on file  Occupational History  . Not on file  Social Needs  . Financial resource strain:  Not on file  . Food insecurity:    Worry: Not on file    Inability: Not on file  . Transportation needs:    Medical: Not on file    Non-medical: Not on file  Tobacco Use  . Smoking status: Former Smoker    Types: Cigars  . Smokeless tobacco: Never Used  . Tobacco comment: rarely smoked  Substance and Sexual Activity  . Alcohol use: No  . Drug use: No  . Sexual activity: Never  Lifestyle  . Physical activity:    Days per week: Not on file    Minutes per session: Not on file  . Stress: Not on file  Relationships  . Social connections:    Talks on phone: Not on file    Gets together: Not on file    Attends religious service: Not on file    Active member of club or organization: Not on file    Attends meetings of clubs or organizations: Not on file    Relationship status: Not on file  . Intimate partner violence:    Fear of current or ex partner: Not on file    Emotionally abused: Not on file    Physically abused: Not on file    Forced sexual activity: Not on file  Other Topics Concern  . Not on file  Social History Narrative  Long-term resident of [facility].  Noncompliant with sodium restriction.    Current Outpatient Medications on File Prior to Visit  Medication Sig Dispense Refill  . acetaminophen (TYLENOL) 325 MG tablet Take 650 mg by mouth every 6 (six) hours as needed for mild pain.    Marland Kitchen amLODipine (NORVASC) 5 MG tablet Take 5 mg by mouth daily.    Marland Kitchen aspirin EC 81 MG EC tablet Take 1 tablet (81 mg total) by mouth daily.    . bisacodyl (DULCOLAX) 10 MG suppository Place 10 mg rectally once as needed (FOR CONSTIPATION NOT RELIEVED BY MILK OF MAGNESIA).    Marland Kitchen cholecalciferol (VITAMIN D) 1000 units tablet Take 1,000 Units by mouth daily.    . Fluticasone Propionate (FLONASE ALLERGY RELIEF NA) Place 2 sprays into the nose at bedtime. Each nostril for allergic rhinitis    . furosemide (LASIX) 80 MG tablet Take 1 tablet (80 mg total) by mouth  daily. 30 tablet 2  . hydrALAZINE (APRESOLINE) 10 MG tablet Take 10 mg by mouth 2 (two) times daily.    . hydrocortisone cream 0.5 % Apply 1 application topically 2 (two) times daily. Apply to rash on right foot until healed    . ipratropium-albuterol (DUONEB) 0.5-2.5 (3) MG/3ML SOLN Take 3 mLs by nebulization every 6 (six) hours as needed.    . isosorbide mononitrate (IMDUR) 30 MG 24 hr tablet Take 30 mg by mouth daily.     Marland Kitchen latanoprost (XALATAN) 0.005 % ophthalmic solution Place 1 drop into both eyes every evening.     . magnesium hydroxide (MILK OF MAGNESIA) 400 MG/5ML suspension Take 5 mLs by mouth daily as needed for mild constipation.    . metoprolol (LOPRESSOR) 50 MG tablet TAKE 1 TABLET (50 MG TOTAL) BY MOUTH 2 (TWO) TIMES DAILY. 60 tablet 5  . Multiple Vitamin (MULTIVITAMIN) tablet Take 1 tablet by mouth daily.    Marland Kitchen Neomycin-Bacitracin-Polymyxin (TRIPLE ANTIBIOTIC EX) Apply 1 application topically daily. Apply to right nostril and all open areas on bilateral toes    . nitroGLYCERIN (NITROSTAT) 0.4 MG SL tablet Place 0.4 mg under the tongue every 5 (five) minutes x 3 doses as needed for chest pain.     . pantoprazole (PROTONIX) 40 MG tablet Take 40 mg by mouth daily.    . potassium chloride SA (K-DUR,KLOR-CON) 20 MEQ tablet Take 1 tablet (20 mEq total) by mouth 2 (two) times daily.    . rosuvastatin (CRESTOR) 10 MG tablet Take 10 mg by mouth. M-W-F    . sennosides-docusate sodium (SENOKOT-S) 8.6-50 MG tablet Take 2 tablets by mouth 2 (two) times daily.     . tamsulosin (FLOMAX) 0.4 MG CAPS capsule Take 0.4 mg by mouth at bedtime.     . vitamin B-12 (CYANOCOBALAMIN) 1000 MCG tablet Take 1,000 mcg by mouth daily.      No current facility-administered medications on file prior to visit.     No Known Allergies  Family History  Problem Relation Age of Onset  . Asthma Neg Hx   . COPD Neg Hx   . Diabetes Neg Hx     BP (!) 130/58 (BP Location: Left Arm, Patient Position: Sitting, Cuff  Size: Normal)   Pulse 66   Wt 197 lb (89.4 kg)   SpO2 97%   BMI 29.95 kg/m    Review of Systems He denies hypoglycemia.      Objective:   Physical Exam Vital signs: see vs page Gen: elderly, frail, no distress.  In wheelchair Pulses: right foot pulse is intace.   MSK: no deformity of the right foot or ankle.  CV: 2+ edema of the right leg Skin:  no ulcer on the right foot or ankle.  Old healed surgical scar (vein harvest) at the right leg Neuro: sensation is intact to touch on the feet and ankles, but decreased from normal.   Ext: There is bilateral onychomycosis of the right foot toenails. Left foot and ankle are bandaged  I reviewed med list--no steroids  Lab Results  Component Value Date   HGBA1C 8.2 03/16/2018       Assessment & Plan:  Insulin-requiring type 2 DM: The pattern of his cbg's indicates he needs some adjustment in his therapy.      Patient Instructions  check resident's blood sugar 4 times a day: before the 3 meals, and at bedtime.  also check if you have symptoms of your blood sugar being too high or too low.  please keep a record of the readings and bring it to your next appointment here (or you can bring the meter itself).  You can write it on any piece of paper.  please call us sooner if your blood sugar goes below 70, or if most cbg's are over 200.  Take novolog, 12 units 3 times a day (with each meal), no matter what cbg is, and: Reduce basaglar to 12 units at bedtime.  Also, take extra novolog, 2 units for any cbg over 300.  Please come back for a follow-up appointment in 3 months.

## 2018-03-17 ENCOUNTER — Ambulatory Visit (INDEPENDENT_AMBULATORY_CARE_PROVIDER_SITE_OTHER): Payer: Medicare HMO | Admitting: Podiatry

## 2018-03-17 DIAGNOSIS — L97929 Non-pressure chronic ulcer of unspecified part of left lower leg with unspecified severity: Secondary | ICD-10-CM | POA: Diagnosis not present

## 2018-03-17 DIAGNOSIS — I83029 Varicose veins of left lower extremity with ulcer of unspecified site: Secondary | ICD-10-CM | POA: Diagnosis not present

## 2018-03-17 DIAGNOSIS — R609 Edema, unspecified: Secondary | ICD-10-CM

## 2018-03-17 DIAGNOSIS — R600 Localized edema: Secondary | ICD-10-CM

## 2018-03-17 DIAGNOSIS — I83892 Varicose veins of left lower extremities with other complications: Secondary | ICD-10-CM

## 2018-03-17 NOTE — Progress Notes (Signed)
  Subjective:  Patient ID: Cole [name], male    DOB: 05-Apr-1920,  MRN: [id number]  Chief Complaint  Patient presents with  . Diabetic Ulcer    left lower leg - looks great - patient is hoping for a break from the R[place][age] y.o. male returns for the above complaint. States the leg looks good. Does not want more Unna boot therapy.  Objective:  There were no vitals filed for this visit. General AA&O x3. Normal mood and affect.  Vascular Pedal pulses palpable.  Neurologic Epicritic sensation grossly intact.  Dermatologic No open lesions. Skin normal texture and turgor. Wounds healed.  Orthopedic: No pain to palpation either foot.   Assessment & Plan:  Patient was evaluated and treated and all questions answered.  VLUs L Leg -Healed. -ACE bandage applied today. Discussed importance of compression.  Return in about 1 month (around 04/14/2018) for Wound Check leg and foot.

## 2018-03-23 ENCOUNTER — Non-Acute Institutional Stay (SKILLED_NURSING_FACILITY): Payer: Medicare HMO | Admitting: Adult Health

## 2018-03-23 ENCOUNTER — Encounter: Payer: Self-pay | Admitting: Adult Health

## 2018-03-23 DIAGNOSIS — I4819 Other persistent atrial fibrillation: Secondary | ICD-10-CM

## 2018-03-23 DIAGNOSIS — I481 Persistent atrial fibrillation: Secondary | ICD-10-CM

## 2018-03-23 DIAGNOSIS — N184 Chronic kidney disease, stage 4 (severe): Secondary | ICD-10-CM

## 2018-03-23 DIAGNOSIS — E7800 Pure hypercholesterolemia, unspecified: Secondary | ICD-10-CM

## 2018-03-23 DIAGNOSIS — I5042 Chronic combined systolic (congestive) and diastolic (congestive) heart failure: Secondary | ICD-10-CM

## 2018-03-23 DIAGNOSIS — E1129 Type 2 diabetes mellitus with other diabetic kidney complication: Secondary | ICD-10-CM | POA: Diagnosis not present

## 2018-03-23 DIAGNOSIS — I25709 Atherosclerosis of coronary artery bypass graft(s), unspecified, with unspecified angina pectoris: Secondary | ICD-10-CM | POA: Diagnosis not present

## 2018-03-23 DIAGNOSIS — E1165 Type 2 diabetes mellitus with hyperglycemia: Secondary | ICD-10-CM

## 2018-03-23 DIAGNOSIS — IMO0002 Reserved for concepts with insufficient information to code with codable children: Secondary | ICD-10-CM

## 2018-03-23 DIAGNOSIS — N400 Enlarged prostate without lower urinary tract symptoms: Secondary | ICD-10-CM

## 2018-03-23 NOTE — Progress Notes (Signed)
Location:  [facility] Room Number: 323-A Place of Service:  SNF (31) Provider:  Durenda Age, NP  Patient Care Team: [name], MD as PCP - General (Internal Medicine) Medina-Vargas, Senaida Lange, NP as Nurse Practitioner (Internal Medicine)  Extended Emergency Contact Information Primary Emergency Contact: [name] [place] Phone: [phone] Mobile Phone: [phone] Relation: Son Secondary Emergency Contact: [name] [place] of [place] Phone: [phone] Mobile Phone: [phone] Relation: Daughter  Code Status:  Full Code  Goals of care: Advanced Directive information Advanced Directives 01/13/2018  Does Patient Have a Medical Advance Directive? No  Type of Advance Directive -  Does patient want to make changes to medical advance directive? -  Copy of [place] in Chart? -  Would patient like information on creating a medical advance directive? No - Patient declined     Chief Complaint  Patient presents with  . Medical Management of Chronic Issues    Routine [facility] visit    HPI:  Pt is a 84 y.o. male seen today for medical management of chronic diseases.  He is a long-term care resident of [facility].  He has a PMH of hypertension and is noncompliant with sodium restriction, diabetes with HLD, and CAD status post CABG. He was seen in the room today. He enjoys looking out the window and was noted to be using his binoculars.      Past Medical History:  Diagnosis Date  . Actinic keratitis   . CHF (congestive heart failure) ([place])   . Claudication ([place])   . Coronary artery disease    status post CABG, 1999  . Diabetes mellitus    Uncontrolled secondary to dietary noncompliance  . Diverticulosis   . DJD (degenerative joint disease)   . Hypertension    hypertensive syndrome with dyspnea -[facility], February, 2011 -  EF 50% and cardiac bypass grafts all patent - responded to diuretics and blood pressure control - [name]  . Microscopic hematuria   . Onychomycosis   . Pneumonia 05/2017  . Prostate nodule   . PSVT (paroxysmal supraventricular tachycardia) ([place])   . Renal disorder   . UTI (urinary tract infection) 09/02/2017   sensitive to Ceftriaxone  . Vitamin B12 deficiency   . Vitamin D deficiency    Past Surgical History:  Procedure Laterality Date  . APPENDECTOMY    . CORONARY ARTERY BYPASS GRAFT      No Known Allergies  Outpatient Encounter Medications as of 03/23/2018  Medication Sig  . acetaminophen (TYLENOL) 325 MG tablet Take 650 mg by mouth every 6 (six) hours as needed for mild pain.  Marland Kitchen amLODipine (NORVASC) 5 MG tablet Take 5 mg by mouth daily.  Marland Kitchen aspirin EC 81 MG EC tablet Take 1 tablet (81 mg total) by mouth daily.  . bisacodyl (DULCOLAX) 10 MG suppository Place 10 mg rectally once as needed (FOR CONSTIPATION NOT RELIEVED BY MILK OF MAGNESIA).  Marland Kitchen cholecalciferol (VITAMIN D) 1000 units tablet Take 1,000 Units by mouth daily.  . Fluticasone Propionate (FLONASE ALLERGY RELIEF NA) Place 2 sprays into the nose at bedtime. Each nostril for allergic rhinitis  . furosemide (LASIX) 80 MG tablet Take 1 tablet (80 mg total) by mouth daily.  . hydrALAZINE (APRESOLINE) 10 MG tablet Take 10 mg by mouth 2 (two) times daily.  . hydrocortisone cream 0.5 % Apply 1 application topically 2 (two) times daily. Apply to rash on  right foot until healed  . insulin aspart (NOVOLOG) 100 UNIT/ML injection Inject 12-14 Units into the skin 3 (three) times daily before meals. extra 2 units for any CBG is over 300  . Insulin Glargine (BASAGLAR KWIKPEN) 100 UNIT/ML SOPN Inject 12-14 Units into the skin at bedtime. Give 12 units QHS.  Take an extra 2 units for CBG >300  . ipratropium-albuterol (DUONEB) 0.5-2.5 (3) MG/3ML SOLN Take 3 mLs by nebulization every 6 (six) hours as needed.  . isosorbide mononitrate  (IMDUR) 30 MG 24 hr tablet Take 30 mg by mouth daily.   Marland Kitchen latanoprost (XALATAN) 0.005 % ophthalmic solution Place 1 drop into both eyes every evening.   . magnesium hydroxide (MILK OF MAGNESIA) 400 MG/5ML suspension Take 5 mLs by mouth daily as needed for mild constipation.  . metolazone (ZAROXOLYN) 2.5 MG tablet Take 2.5 mg by mouth See admin instructions. Take every Saturday 30 minutes before furosemide  . metoprolol (LOPRESSOR) 50 MG tablet TAKE 1 TABLET (50 MG TOTAL) BY MOUTH 2 (TWO) TIMES DAILY.  . Multiple Vitamin (MULTIVITAMIN) tablet Take 1 tablet by mouth daily.  . nitroGLYCERIN (NITROSTAT) 0.4 MG SL tablet Place 0.4 mg under the tongue every 5 (five) minutes x 3 doses as needed for chest pain.   . pantoprazole (PROTONIX) 40 MG tablet Take 40 mg by mouth daily.  . potassium chloride SA (K-DUR,KLOR-CON) 20 MEQ tablet Take 1 tablet (20 mEq total) by mouth 2 (two) times daily.  . rosuvastatin (CRESTOR) 10 MG tablet Take 10 mg by mouth. M-W-F  . sennosides-docusate sodium (SENOKOT-S) 8.6-50 MG tablet Take 2 tablets by mouth 2 (two) times daily.   . tamsulosin (FLOMAX) 0.4 MG CAPS capsule Take 0.4 mg by mouth at bedtime.   . vitamin B-12 (CYANOCOBALAMIN) 1000 MCG tablet Take 1,000 mcg by mouth daily.   . [DISCONTINUED] Insulin Glargine (BASAGLAR KWIKPEN) 100 UNIT/ML SOPN Inject 0.12 mLs (12 Units total) into the skin at bedtime.  . [DISCONTINUED] Neomycin-Bacitracin-Polymyxin (TRIPLE ANTIBIOTIC EX) Apply 1 application topically daily. Apply to right nostril and all open areas on bilateral toes   No facility-administered encounter medications on file as of 03/23/2018.     Review of Systems  GENERAL: No change in appetite, no fatigue, no weight changes, no fever, chills or weakness MOUTH and THROAT: Denies oral discomfort, gingival pain or bleeding, pain from teeth or hoarseness   RESPIRATORY: no cough, SOB, DOE, wheezing, hemoptysis CARDIAC: No chest pain, edema or palpitations GI: No  abdominal pain, diarrhea, constipation, heart burn, nausea or vomiting PSYCHIATRIC: Denies feelings of depression or anxiety. No report of hallucinations, insomnia, paranoia, or agitation   Immunization History  Administered Date(s) Administered  . Influenza-Unspecified 08/06/2015, 09/03/2016, 09/02/2017  . PPD Test 09/20/2015  . Pneumococcal Polysaccharide-23 08/06/2015  . Pneumococcal-Unspecified 09/03/2016  . Tdap 07/28/2015   Pertinent  Health Maintenance Due  Topic Date Due  . PNA vac Low Risk Adult (2 of 2 - PCV13) 01/10/2019 (Originally 09/03/2017)  . URINE MICROALBUMIN  11/17/2023 (Originally 12/10/2016)  . INFLUENZA VACCINE  06/16/2018  . HEMOGLOBIN A1C  09/16/2018  . OPHTHALMOLOGY EXAM  02/01/2019  . FOOT EXAM  03/17/2019   Fall Risk  07/20/2017 01/05/2017 09/09/2016 08/24/2016 08/07/2016  Falls in the past year? No No No No No      Vitals:   03/23/18 1258  BP: (!) 140/56  Pulse: 82  Resp: 20  Temp: (!) 97.4 F (36.3 C)  TempSrc: Oral  SpO2: 96%  Weight: 190 lb 9.6 oz (  86.5 kg)  Height: 5\' 8"  (1.727 m)   Body mass index is 28.98 kg/m.  Physical Exam  GENERAL APPEARANCE: Well nourished. In no acute distress. Normal body habitus MOUTH and THROAT: Lips are without lesions. Oral mucosa is moist and without lesions. Tongue is normal in shape, size, and color and without lesions RESPIRATORY: Breathing is even & unlabored, BS CTAB CARDIAC: RRR, no murmur,no extra heart sounds, no edema GI: Abdomen soft, normal BS, no masses, no tenderness EXTREMITIES:  Able to move X 4 extremities, wears bilateral orthotic shoes PSYCHIATRIC: Alert and oriented X 3. Affect and behavior are appropriate   Labs reviewed: Recent Labs    06/11/17 1907  06/23/17 0614 06/23/17 1441  06/25/17 0315 06/26/17 0416  10/11/17 1520  01/13/18 1839  02/24/18 03/01/18 03/10/18  NA 127*   < >  --  138   < > 138 139   < > 139   < > 139   < > 140 137 136*  K 5.0   < >  --  4.0   < > 3.6 3.8   <  > 4.7   < > 3.8   < > 4.3 4.0 3.4  CL 93*   < >  --  106   < > 105 104  --  100  --  105  --   --   --   --   CO2 22   < >  --  21*   < > 24 24  --  20  --  21*  --   --   --   --   GLUCOSE 275*   < >  --  195*   < > 139* 105*  --  225*  --  138*  --   --   --   --   BUN 127*   < >  --  41*   < > 30* 29*   < > 51*   < > 57*   < > 45* 49* 56*  CREATININE 4.14*   < >  --  2.11*   < > 1.87* 1.86*   < > 2.07*   < > 2.29*   < > 2.1* 1.9* 2.1*  CALCIUM 8.9   < >  --  8.6*   < > 8.7* 8.8*  --  9.0  --  9.1  --   --   --   --   MG 1.7  --  1.9 1.8  --   --   --   --   --   --   --   --   --   --   --   PHOS  --   --  4.0  --   --  3.4 3.8  --   --   --   --   --   --   --   --    < > = values in this interval not displayed.   Recent Labs    06/22/17 1608 06/23/17 0108 06/23/17 1441 06/25/17 0315 06/26/17 0416 07/01/17  AST 27 23 22   --   --  13*  ALT 26 23 25   --   --  19  ALKPHOS 70 65 72  --   --  67  BILITOT 1.4* 1.5* 1.3*  --   --   --   PROT 7.0 6.4* 6.6  --   --   --   ALBUMIN 3.2* 3.0*  2.9* 3.0* 2.9*  --    Recent Labs    06/26/17 0416 07/01/17  10/11/17 1520 01/13/18 1839 03/10/18  WBC 6.9 8.6   < > 8.4 6.7 8.9  NEUTROABS  --  6  --   --  5.2 7  HGB 9.6* 10.7*   < > 10.5* 15.4 10.6*  HCT 29.1* 32*   < > 32.3* 45.9 32*  MCV 86.4  --   --  90 90.0  --   PLT 237 262   < > 229 114* 191   < > = values in this interval not displayed.   Lab Results  Component Value Date   TSH 1.266 06/23/2017   Lab Results  Component Value Date   HGBA1C 8.2 03/16/2018   Lab Results  Component Value Date   CHOL 181 06/19/2017   HDL 49 06/19/2017   LDLCALC 117 06/19/2017   TRIG 75 06/19/2017   CHOLHDL 2.5 01/01/2010    Assessment/Plan  1. Benign prostatic hyperplasia without lower urinary tract symptoms - continue Tamsulosin 0.4 mg 1 capsule Q HS   2. Chronic combined systolic and diastolic CHF (congestive heart failure) (HCC) - no SOB, continue Metolazone 2.5 mg 1 tab Q Saturday,  Lasix 80 mg daily, Hydralazine 10 mg 1 tab BID, Metoprolol tartrate 50 mg 1 tab BID   3. Coronary artery disease involving coronary bypass graft of native heart with angina pectoris (HCC) - no complaints of chest pain, continue NTG PRN, Isosorbide MN ER 30 mg daily, Rosuvastatin 10 mg Q M_W_F, ASA EC 81 mg daily   4. Persistent atrial fibrillation (HCC) - rate-controlled, continue Metoprolol tartrate 50 mg 1 tab BID, ASA 81 mg daily   5. Pure hypercholesterolemia - continue continue Rosuvastatin 10 mg Q M_W_F   6. Diabetes mellitus with renal manifestations, uncontrolled (HCC) - continue Novolog 100 units/ml inject 12 units with meals,  And additional 2 units for CBG >300, Basaglar 100 units/ml inject 12 units SQ Q HS and add extra 2 units for CBG >300 Lab Results  Component Value Date   HGBA1C 8.2 03/16/2018    7.Chronic kidney disease, stage IV - will monitor Lab Results  Component Value Date   CREATININE 1.95 (H) 04/05/2018       Family/ staff Communication: Discussed plan of care with resident.  Labs/tests ordered:  None  Goals of care:   Long-term care   Durenda Age, NP [facility] [phone] (Monday-Friday 8:00 a.m. - 5:00 p.m.) [phone] (after hours)
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
## 2018-04-05 ENCOUNTER — Encounter: Payer: Self-pay | Admitting: Nurse Practitioner

## 2018-04-05 ENCOUNTER — Ambulatory Visit (INDEPENDENT_AMBULATORY_CARE_PROVIDER_SITE_OTHER): Payer: Medicare HMO | Admitting: Nurse Practitioner

## 2018-04-05 VITALS — BP 164/64 | HR 67 | Ht 68.0 in | Wt 202.1 lb

## 2018-04-05 DIAGNOSIS — I5042 Chronic combined systolic (congestive) and diastolic (congestive) heart failure: Secondary | ICD-10-CM

## 2018-04-05 MED ORDER — METOLAZONE 2.5 MG PO TABS: 2.5000 mg | ORAL_TABLET | ORAL | 3 refills | Status: DC

## 2018-04-05 NOTE — Patient Instructions (Signed)
We will be checking the following labs today - BMET, CBC and BNP   Medication Instructions:    Continue with your current medicines. BUT   I am increasing the Zaroxolyn to 2. 5 mg to take tomorrow, Thursday and Saturday this week then take every Tuesday and Saturday.     Testing/Procedures To Be Arranged:  N/A  Follow-Up:   See me in one month.     Other Special Instructions:   N/A    If you need a refill on your cardiac medications before your next appointment, please call your pharmacy.   Call the [place] office at [phone] if you have any questions, problems or concerns.

## 2018-04-06 ENCOUNTER — Telehealth: Payer: Self-pay | Admitting: Nurse Practitioner

## 2018-04-06 LAB — CBC
Hematocrit: 32.5 % — ABNORMAL LOW (ref 37.5–51.0)
Hemoglobin: 10.5 g/dL — ABNORMAL LOW (ref 13.0–17.7)
MCH: 28.5 pg (ref 26.6–33.0)
MCHC: 32.3 g/dL (ref 31.5–35.7)
MCV: 88 fL (ref 79–97)
Platelets: 200 10*3/uL (ref 150–450)
RBC: 3.69 x10E6/uL — ABNORMAL LOW (ref 4.14–5.80)
RDW: 14.1 % (ref 12.3–15.4)
WBC: 9.2 10*3/uL (ref 3.4–10.8)

## 2018-04-06 LAB — BASIC METABOLIC PANEL
BUN/Creatinine Ratio: 22 (ref 10–24)
BUN: 42 mg/dL — ABNORMAL HIGH (ref 10–36)
CO2: 22 mmol/L (ref 20–29)
Calcium: 9.1 mg/dL (ref 8.6–10.2)
Chloride: 102 mmol/L (ref 96–106)
Creatinine, Ser: 1.95 mg/dL — ABNORMAL HIGH (ref 0.76–1.27)
GFR calc Af Amer: 33 mL/min/{1.73_m2} — ABNORMAL LOW (ref >59)
GFR calc non Af Amer: 28 mL/min/{1.73_m2} — ABNORMAL LOW (ref >59)
Glucose: 192 mg/dL — ABNORMAL HIGH (ref 65–99)
Potassium: 4 mmol/L (ref 3.5–5.2)
Sodium: 140 mmol/L (ref 134–144)

## 2018-04-06 LAB — PRO B NATRIURETIC PEPTIDE: NT-Pro BNP: 3175 pg/mL — ABNORMAL HIGH (ref 0–486)

## 2018-04-06 NOTE — Telephone Encounter (Signed)
New message    Patient states he has lost form that was completed on 04/05/18 office visit for handicap placard.
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
## 2018-04-07 NOTE — Telephone Encounter (Signed)
Noted  

## 2018-04-08 DIAGNOSIS — I25119 Atherosclerotic heart disease of native coronary artery with unspecified angina pectoris: Secondary | ICD-10-CM | POA: Diagnosis not present

## 2018-04-08 DIAGNOSIS — E1122 Type 2 diabetes mellitus with diabetic chronic kidney disease: Secondary | ICD-10-CM | POA: Diagnosis not present

## 2018-04-08 DIAGNOSIS — E1151 Type 2 diabetes mellitus with diabetic peripheral angiopathy without gangrene: Secondary | ICD-10-CM | POA: Diagnosis not present

## 2018-04-08 DIAGNOSIS — Z794 Long term (current) use of insulin: Secondary | ICD-10-CM | POA: Diagnosis not present

## 2018-04-08 DIAGNOSIS — N183 Chronic kidney disease, stage 3 (moderate): Secondary | ICD-10-CM | POA: Diagnosis not present

## 2018-04-08 DIAGNOSIS — I509 Heart failure, unspecified: Secondary | ICD-10-CM | POA: Diagnosis not present

## 2018-04-08 DIAGNOSIS — E11622 Type 2 diabetes mellitus with other skin ulcer: Secondary | ICD-10-CM | POA: Diagnosis not present

## 2018-04-08 DIAGNOSIS — I130 Hypertensive heart and chronic kidney disease with heart failure and stage 1 through stage 4 chronic kidney disease, or unspecified chronic kidney disease: Secondary | ICD-10-CM | POA: Diagnosis not present

## 2018-04-08 DIAGNOSIS — L98499 Non-pressure chronic ulcer of skin of other sites with unspecified severity: Secondary | ICD-10-CM | POA: Diagnosis not present

## 2018-04-08 DIAGNOSIS — E669 Obesity, unspecified: Secondary | ICD-10-CM | POA: Diagnosis not present

## 2018-04-20 IMAGING — CR DG CHEST 2V
2 series · 3 of 3 positions shown · non-contrast
Comparison: 08/08/2016

CLINICAL DATA: Shortness of breath tonight.

EXAM:
CHEST  2 VIEW

[Series 2: chest lat · 0.14mm/px · 2 of 2 slices shown]
[im 1/2]
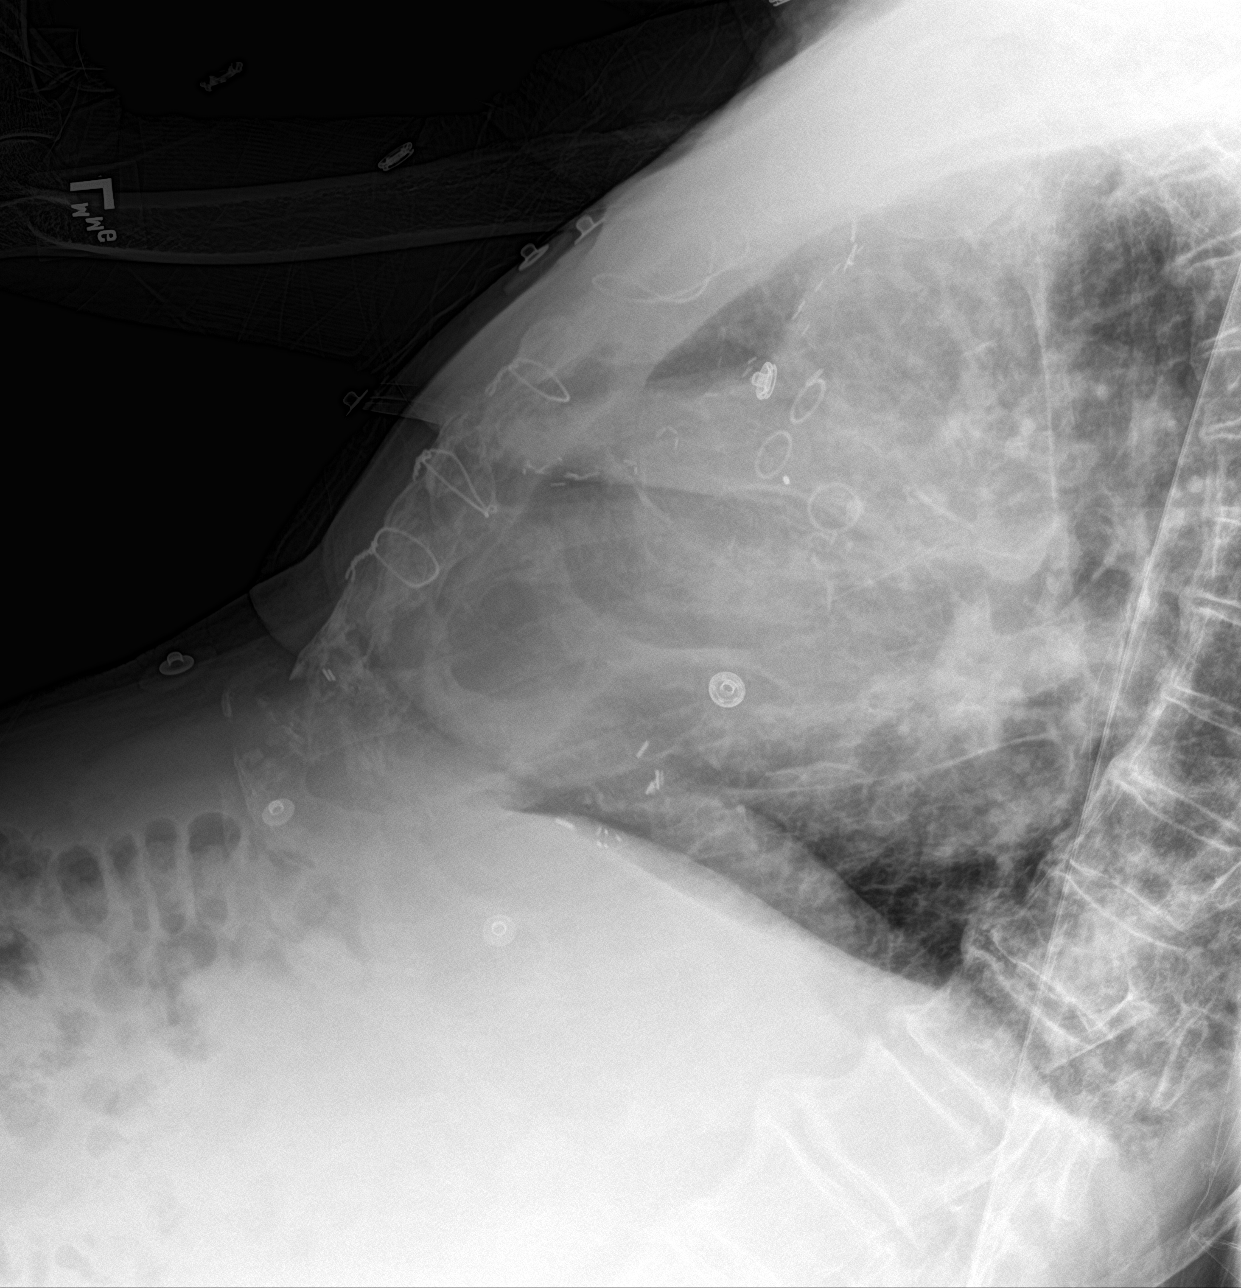
[im 2/2]
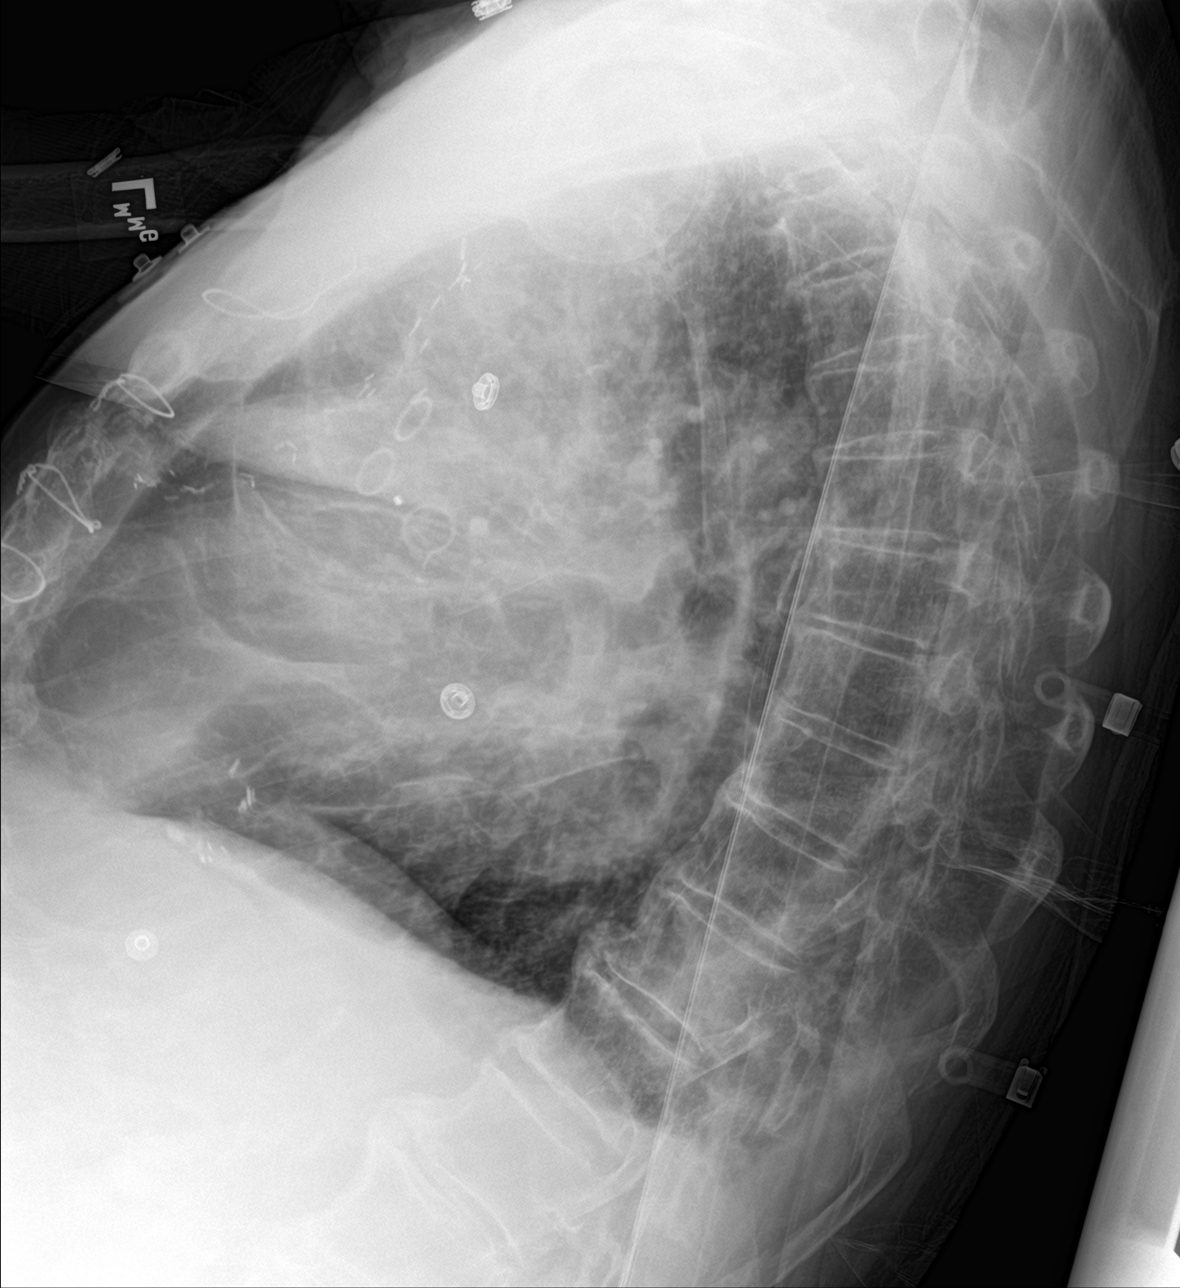

[chest ap]
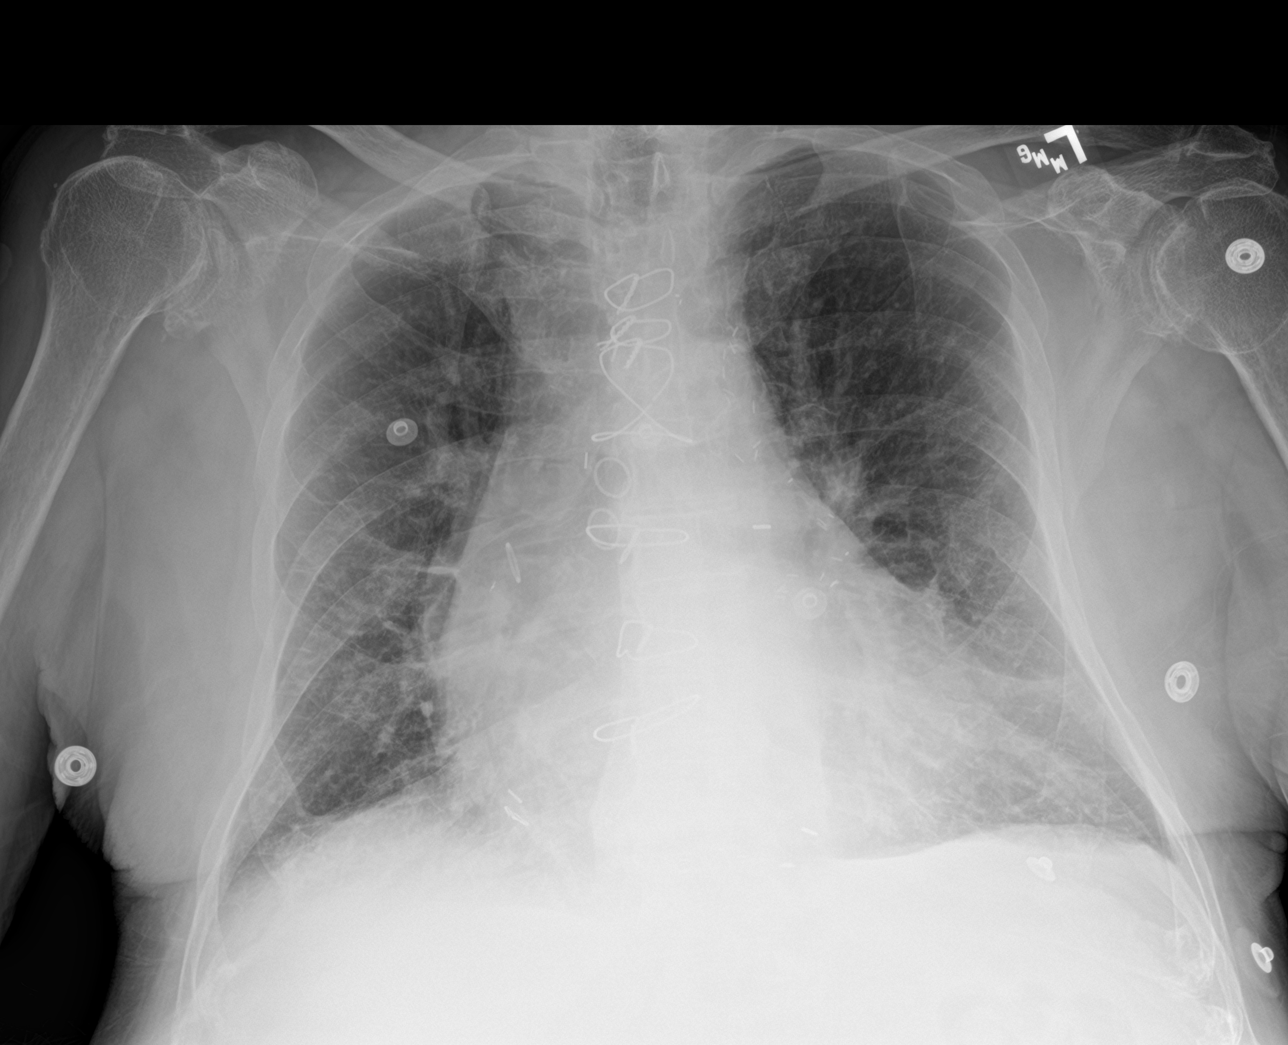

[3 of 3 positions shown; findings below may reference images not displayed]

FINDINGS: Postoperative changes in the mediastinum. Mild cardiac enlargement
without vascular congestion. Mild peripheral interstitial changes in
the lungs are developing since previous study and may indicate
developing edema. No focal consolidation. Blunting of the
costophrenic angles suggests small effusions. Emphysematous changes
and fibrosis in the lungs. No pneumothorax. Degenerative changes in
the spine and shoulders.
IMPRESSION: Cardiac enlargement with developing interstitial pattern to the
lungs suggesting developing interstitial edema. Underlying
emphysematous changes and fibrosis in the lungs.

## 2018-04-28 ENCOUNTER — Non-Acute Institutional Stay (SKILLED_NURSING_FACILITY): Payer: Medicare HMO | Admitting: Adult Health

## 2018-04-28 ENCOUNTER — Encounter: Payer: Self-pay | Admitting: Adult Health

## 2018-04-28 DIAGNOSIS — N184 Chronic kidney disease, stage 4 (severe): Secondary | ICD-10-CM

## 2018-04-28 DIAGNOSIS — Z0471 Encounter for examination and observation following alleged adult physical abuse: Secondary | ICD-10-CM

## 2018-04-28 DIAGNOSIS — E1165 Type 2 diabetes mellitus with hyperglycemia: Secondary | ICD-10-CM

## 2018-04-28 DIAGNOSIS — R69 Illness, unspecified: Secondary | ICD-10-CM | POA: Diagnosis not present
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
## 2018-04-29 DIAGNOSIS — D649 Anemia, unspecified: Secondary | ICD-10-CM | POA: Diagnosis not present

## 2018-04-29 DIAGNOSIS — Z79899 Other long term (current) drug therapy: Secondary | ICD-10-CM | POA: Diagnosis not present

## 2018-04-29 LAB — CBC AND DIFFERENTIAL
HCT: 34 — AB (ref 41–53)
HEMOGLOBIN: 11.4 — AB (ref 13.5–17.5)
Neutrophils Absolute: 6
PLATELETS: 175 (ref 150–399)
WBC: 9.1

## 2018-04-29 LAB — BASIC METABOLIC PANEL
BUN: 69 — AB (ref 4–21)
CREATININE: 2.4 — AB (ref 0.6–1.3)
Glucose: 242
Potassium: 3.2 — AB (ref 3.4–5.3)
Sodium: 138 (ref 137–147)

## 2018-04-29 LAB — HEPATIC FUNCTION PANEL
ALK PHOS: 50 (ref 25–125)
ALT: 7 — AB (ref 10–40)
AST: 10 — AB (ref 14–40)
Bilirubin, Total: 0.6

## 2018-05-09 ENCOUNTER — Encounter: Payer: Self-pay | Admitting: Podiatry

## 2018-05-10 ENCOUNTER — Encounter: Payer: Self-pay | Admitting: Nurse Practitioner

## 2018-05-10 ENCOUNTER — Ambulatory Visit: Payer: Medicare HMO | Admitting: Nurse Practitioner

## 2018-05-10 ENCOUNTER — Non-Acute Institutional Stay (SKILLED_NURSING_FACILITY): Payer: Medicare HMO | Admitting: Internal Medicine

## 2018-05-10 ENCOUNTER — Encounter: Payer: Self-pay | Admitting: Internal Medicine

## 2018-05-10 VITALS — BP 138/50 | HR 61 | Ht 70.0 in | Wt 196.1 lb

## 2018-05-10 DIAGNOSIS — S80822A Blister (nonthermal), left lower leg, initial encounter: Secondary | ICD-10-CM | POA: Diagnosis not present

## 2018-05-10 DIAGNOSIS — I5032 Chronic diastolic (congestive) heart failure: Secondary | ICD-10-CM

## 2018-05-10 DIAGNOSIS — I2510 Atherosclerotic heart disease of native coronary artery without angina pectoris: Secondary | ICD-10-CM

## 2018-05-10 DIAGNOSIS — I5042 Chronic combined systolic (congestive) and diastolic (congestive) heart failure: Secondary | ICD-10-CM

## 2018-05-10 DIAGNOSIS — E1151 Type 2 diabetes mellitus with diabetic peripheral angiopathy without gangrene: Secondary | ICD-10-CM | POA: Diagnosis not present

## 2018-05-10 DIAGNOSIS — I481 Persistent atrial fibrillation: Secondary | ICD-10-CM

## 2018-05-10 DIAGNOSIS — I70209 Unspecified atherosclerosis of native arteries of extremities, unspecified extremity: Secondary | ICD-10-CM

## 2018-05-10 DIAGNOSIS — I4819 Other persistent atrial fibrillation: Secondary | ICD-10-CM

## 2018-05-10 NOTE — Assessment & Plan Note (Signed)
05/10/18 clinically patient has regular rhythm with well-controlled rate today
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
## 2018-05-10 NOTE — Assessment & Plan Note (Addendum)
Salted peanut butter crackers at the bedside, but cardiopulmonary symptoms stable Risk of sodium intake in reference to his heart failure and CKD discussed Cardiology follow up visit today

## 2018-05-10 NOTE — Assessment & Plan Note (Addendum)
He denies requesting blood draws from the joints of his fingers I explained the avascular nature of the joint and  the risk of osteomyelitis were joint sticks to be performed

## 2018-05-10 NOTE — Progress Notes (Signed)
NURSING HOME LOCATION:  [facility] ROOM NUMBER:  [id number]  CODE STATUS:  Full Code  PCP:  [name], MD  [address] [place] [address]  This is a nursing facility follow up of chronic medical diagnoses.  Interim medical record and care since last [place] visit was updated with review of diagnostic studies and change in clinical status since last visit were documented.  HPI: The patient is a permanent resident of the facility with medical diagnoses of chronic combined systolic and diastolic congestive heart failure, diabetes with neuropathy and peripheral vascular disease, paroxysmal  A. fib & CKD III. He was last seen by cardiology 5/21. Clinically he was in sinus rhythm with well-controlled rate. Extra dose of Zaroxolyn dose was recommended with maintenance doses Tues and Saturdays. Focus was to be on symptom relief because of his combined heart failure and CKD. He saw his endocrinologist 5/1. Insulin aspart 12 units before meals with 2 units for any CABG over 300 & basal insulin 12 units at bedtime was continued. Lowest random glucose has been 210 and and the highest recorded glucose was 585. He also has "high" recorded at bedtime.Staff thought he was requesting joint aspiration sticks in lieu of fingersticks for glucose. His labs are current. On 6/14 potassium was 3.2, glucose 242, BUN 69, and creatinine 2.4. Creatinine had risen from 1.95 His anemia had improved to hemoglobin 11.4 hematocrit 34.  Review of systems: Describes occasional exertional dyspnea. He has urinary frequency only with the Lasix administration. He denies any other cardiopulmonary symptoms or symptoms related to his diabetes. He has salted peanut crackers by his bedside. He also has multiple containers of salt and on the bedside table. When I explained the pathophysiology of volume overload in the context of heart failure and chronic kidney disease & its relationship to salt intake, his reply  was "I don't eat much (salt)".  Constitutional: No fever, significant weight change, fatigue  Eyes: No redness, discharge, pain, vision change ENT/mouth: No nasal congestion,  purulent discharge, earache, change in hearing, sore throat  Cardiovascular: No chest pain, palpitations, paroxysmal nocturnal dyspnea, claudication, edema  Respiratory: No cough, sputum production, hemoptysis, significant snoring, apnea   Gastrointestinal: No heartburn, dysphagia, abdominal pain, nausea /vomiting, rectal bleeding, melena, change in bowels Genitourinary: No dysuria, hematuria, pyuria, incontinence, nocturia Musculoskeletal: No joint stiffness, joint swelling, weakness, pain Dermatologic: No rash, pruritus, change in appearance of skin Neurologic: No dizziness, headache, syncope, seizures, numbness, tingling Psychiatric: No significant anxiety, depression, insomnia, anorexia Endocrine: No change in hair/skin/nails, excessive thirst, excessive hunger  Hematologic/lymphatic: No significant bruising, lymphadenopathy, abnormal bleeding Allergy/immunology: No itchy/watery eyes, significant sneezing, urticaria, angioedema  Physical exam:  Pertinent or positive findings: Pattern alopecia is present. He has dense arcus senilis. He is edentulous except for only a few lower anterior teeth which appear carious. Chest surprisingly clear. Heart rate is slow and regular. Gynecomastia is present. Abdomen is massive and protuberant. Pedal pulses are decreased. He has DIP osteoarthritic changes in isolated fashion. Has chronic irregular surface contour fingernail changes with discoloration of the left hand. He is symmetrically weak to opposition. He has diffuse keratoses over the posterior thorax. He has a small blister over the posterior ankle area.  General appearance: Adequately nourished; no acute distress, increased work of breathing is present.   Lymphatic: No lymphadenopathy about the head, neck, axilla. Eyes: No  conjunctival inflammation or lid edema is present. There is no scleral icterus. Ears:  External ear exam shows  no significant lesions or deformities.   Nose:  External nasal examination shows no deformity or inflammation. Nasal mucosa are pink and moist without lesions, exudates Oral exam:  Lips and gums are healthy appearing. There is no oropharyngeal erythema or exudate. Neck:  No thyromegaly, masses, tenderness noted.    Heart:  No gallop, murmur, click, rub .  Lungs: Chest clear to auscultation without wheezes, rhonchi, rales, rubs. Abdomen: Bowel sounds are normal. Abdomen is soft and nontender with no organomegaly, hernias, masses. GU: Deferred  Extremities:  No cyanosis, clubbing, edema  Neurologic exam : Balance, Rhomberg, finger to nose testing could not be completed due to clinical state Skin: Warm & dry w/o tenting. No significant  rash.  See summary under each active problem in the Problem List with associated updated therapeutic plan

## 2018-05-10 NOTE — Patient Instructions (Signed)
See assessment and plan under each diagnosis in the problem list and acutely for this visit 

## 2018-05-10 NOTE — Patient Instructions (Addendum)
We will be checking the following labs today - BMET  Medication Instructions:    Continue with your current medicines.     Testing/Procedures To Be Arranged:  N/A  Follow-Up:   See me in 2 to 3 months    Other Special Instructions:   N/A    If you need a refill on your cardiac medications before your next appointment, please call your pharmacy.   Call the [place] office at [phone] if you have any questions, problems or concerns.

## 2018-05-11 ENCOUNTER — Telehealth: Payer: Self-pay

## 2018-05-11 LAB — BASIC METABOLIC PANEL
BUN/Creatinine Ratio: 30 — ABNORMAL HIGH (ref 10–24)
BUN: 72 mg/dL — ABNORMAL HIGH (ref 10–36)
CO2: 25 mmol/L (ref 20–29)
Calcium: 9 mg/dL (ref 8.6–10.2)
Chloride: 88 mmol/L — ABNORMAL LOW (ref 96–106)
Creatinine, Ser: 2.4 mg/dL — ABNORMAL HIGH (ref 0.76–1.27)
GFR calc Af Amer: 26 mL/min/{1.73_m2} — ABNORMAL LOW (ref >59)
GFR calc non Af Amer: 22 mL/min/{1.73_m2} — ABNORMAL LOW (ref >59)
Glucose: 554 mg/dL (ref 65–99)
Potassium: 3.7 mmol/L (ref 3.5–5.2)
Sodium: 132 mmol/L — ABNORMAL LOW (ref 134–144)

## 2018-05-11 MED ORDER — FUROSEMIDE 40 MG PO TABS: 60.0000 mg | ORAL_TABLET | Freq: Every day | ORAL | Status: DC

## 2018-05-11 NOTE — Telephone Encounter (Signed)
-----   Message from Daune Perch, NP sent at 05/11/2018  6:59 AM EDT ----- Please call pt regarding his blood glucose of 554 on labs done yesterday. This is very high. Please see what his blood sugar is this morning. He may need to call his PCP for direction. If he is symptomatic he should go to the hospital.

## 2018-05-11 NOTE — Telephone Encounter (Signed)
Spoke with patient. Patient stated he feels fine. Ramos [phone], informed them of lab results and changes. Nurse verbalized understanding and will await the fax. Faxed lab results to [phone].   Daune Perch, NP  P [facility] Triage        I routed this patient's lab to triage but I wanted to make sure that it got sent. He had a blood glucose over 500 on labs done yesterday. Please call him to discuss. What is his blood sugar this morning? Is he taking his insulin? Is he ill, which could cause BG to rise. He should call his PCP regarding his blood sugars. If he is symptomatic, ie lethargic, he should go to the hospital.   Thanks, Pecolia Ades NP    Notes recorded by Burtis Junes, NP on 05/11/2018 at 7:40 AM EDT Ok to report. Labs show that his glucose is markedly elevated - would ask house MD to address. Kidney function is a little worse - would cut Lasix back to 60 mg a day. Continue with the twice a week Zaroxolyn.  BMET in one week please - ok to do at the facility and fax to me.

## 2018-05-13 ENCOUNTER — Telehealth: Payer: Self-pay | Admitting: Nurse Practitioner

## 2018-05-13 ENCOUNTER — Telehealth: Payer: Self-pay | Admitting: Podiatry

## 2018-05-13 ENCOUNTER — Telehealth: Payer: Self-pay

## 2018-05-13 ENCOUNTER — Ambulatory Visit (INDEPENDENT_AMBULATORY_CARE_PROVIDER_SITE_OTHER): Payer: Medicare HMO | Admitting: Podiatry

## 2018-05-13 ENCOUNTER — Encounter

## 2018-05-13 DIAGNOSIS — L97521 Non-pressure chronic ulcer of other part of left foot limited to breakdown of skin: Secondary | ICD-10-CM | POA: Diagnosis not present

## 2018-05-13 DIAGNOSIS — L02612 Cutaneous abscess of left foot: Secondary | ICD-10-CM | POA: Diagnosis not present

## 2018-05-13 DIAGNOSIS — E1151 Type 2 diabetes mellitus with diabetic peripheral angiopathy without gangrene: Secondary | ICD-10-CM | POA: Diagnosis not present

## 2018-05-13 DIAGNOSIS — E11621 Type 2 diabetes mellitus with foot ulcer: Secondary | ICD-10-CM | POA: Diagnosis not present

## 2018-05-13 DIAGNOSIS — B351 Tinea unguium: Secondary | ICD-10-CM

## 2018-05-13 DIAGNOSIS — L03032 Cellulitis of left toe: Secondary | ICD-10-CM

## 2018-05-13 MED ORDER — CEPHALEXIN 500 MG PO CAPS: 500.0000 mg | ORAL_CAPSULE | Freq: Two times a day (BID) | ORAL | 0 refills | Status: DC

## 2018-05-13 NOTE — Telephone Encounter (Signed)
-----   Message from Rivka Barbara sent at 05/13/2018  3:07 PM EDT ----- Regarding: Fax [facility] did confirm the fax number is [phone]. The nurse hung up before I could get her name and contact number at [facility]. But she did say a verbal order would work as well.

## 2018-05-13 NOTE — Telephone Encounter (Signed)
Disregard opened in error °

## 2018-05-13 NOTE — Telephone Encounter (Signed)
[facility] called to confirm medication orders for keflex from today. Gave verbal orders based on what Dr. March Rummage wrote today. I spoke to Iris.   Cole [name]

## 2018-05-13 NOTE — Telephone Encounter (Signed)
Refaxed lab results and instructions per Truitt Merle NP to [facility] with number provided.

## 2018-05-15 NOTE — Progress Notes (Signed)
  Subjective:  Patient ID: Cole [name], male    DOB: 06-01-1920,  MRN: [id number]  No chief complaint on file.  84 y.o. male returns for diabetic foot care.  Denies new issues.  Endorses diabetes with elongated toenails  Objective:   General AA&O x3. Normal mood and affect.  Vascular Dorsalis pedis pulses present 1+ bilaterally  Posterior tibial pulses absent bilaterally  Capillary refill normal to all digits. Pedal hair growth normal.  Neurologic Epicritic sensation present bilaterally. Protective sensation with 5.07 monofilament  present bilaterally. Vibratory sensation present bilaterally.  Dermatologic No open lesions. Interspaces clear of maceration.  Normal skin temperature and turgor. Hyperkeratotic lesions: None bilaterally. Nails: brittle, onychomycosis, thickening, elongation Left third toe superficial ulcer over the proximal interphalangeal joint with purulent exudate but no probe to bone.  Orthopedic: No history of amputation. MMT 5/5 in dorsiflexion, plantarflexion, inversion, and eversion. Normal lower extremity joint ROM without pain or crepitus.     Assessment & Plan:  Patient was evaluated and treated and all questions answered.  Diabetes with PAD, Onychomycosis -Educated on diabetic footcare. Diabetic risk level 2 -At risk foot care provided as below.  Procedure: Nail Debridement Rationale: Patient meets criteria for routine foot care due to PAD Type of Debridement: manual, sharp debridement. Instrumentation: Nail nipper, rotary burr. Number of Nails: 10  L 3rd Toe Ulcer -Rx Keflex -Debridement as below. -Dressed wit Silvadene and DSD orders written h facility to apply as such.   -We will perform x-rays at next visit  Procedure: Excisional Debridement of Wound Rationale: Removal of non-viable soft tissue from the wound to promote healing.  Anesthesia: none Pre-Debridement Wound Measurements: overlying hyperkeratosis Post-Debridement Wound  Measurements: 0.5 cm x 0.5 cm x 0.2 cm  Type of Debridement: Excisional Tissue Removed: Non-viable soft tissue Depth of Debridement: subq Instrumentation: 3-0 mm dermal curette Technique: Sharp excisional debridement to bleeding, viable wound base.  Dressing: Dry, sterile, compression dressing. Disposition: Patient tolerated procedure well. Patient to return in 1 week for follow-up.      Return in about 2 weeks (around 05/27/2018) for Wound Care, Left 3rd toe.  Needs x-ray

## 2018-05-16 ENCOUNTER — Non-Acute Institutional Stay (SKILLED_NURSING_FACILITY): Payer: Medicare HMO | Admitting: Adult Health

## 2018-05-16 ENCOUNTER — Encounter: Payer: Self-pay | Admitting: Adult Health

## 2018-05-16 DIAGNOSIS — L03032 Cellulitis of left toe: Secondary | ICD-10-CM

## 2018-05-16 DIAGNOSIS — E1165 Type 2 diabetes mellitus with hyperglycemia: Secondary | ICD-10-CM

## 2018-05-16 DIAGNOSIS — M19011 Primary osteoarthritis, right shoulder: Secondary | ICD-10-CM

## 2018-05-16 NOTE — Progress Notes (Signed)
Location:  [place] Room Number: 323-A Place of Service:  SNF (31) Provider:  Durenda Age, NP  Patient Care Team: [name], MD as PCP - General (Internal Medicine) Medina-Vargas, Senaida Lange, NP as Nurse Practitioner (Internal Medicine)  Extended Emergency Contact Information Primary Emergency Contact: [name] [place] Phone: [phone] Mobile Phone: [phone] Relation: Son Secondary Emergency Contact: [name] [place] Phone: [phone] Mobile Phone: [phone] Relation: Daughter  Code Status:  Full Code  Goals of care: Advanced Directive information Advanced Directives 01/13/2018  Does Patient Have a Medical Advance Directive? No  Type of Advance Directive -  Does patient want to make changes to medical advance directive? -  Copy of [place] in Chart? -  Would patient like information on creating a medical advance directive? No - Patient declined     Chief Complaint  Patient presents with  . Acute Visit    The patient is seen for an acute visit secondary to "high" blood sugars.    HPI:  Pt is a 84 y.o. male seen today for an acute visit due to several "high" blood sugars.  He is a long-term care resident of [facility].  He has a PMH of hypertension, diabetes with HLD, and CAD status post CABG. He was seen in his room. CBGs were  330, 252, 335, 352 in AMs, 315, 323, 452, 401 at lunch, and 393, 380, HI, 505, 501 at dinner. He was recently started on Keflex for left foot 3rd toe infection. He complained of right shoulder pain. No erythema nor swelling has been noted. He thinks that it is arthritis that's causing the pain. He requested for Acetaminophen as needed for the pain. He does not like to take other pain medications.    Past Medical History:  Diagnosis Date  . Actinic keratitis   . CHF (congestive heart failure) ([place])    . Claudication ([place])   . Coronary artery disease    status post CABG, 1999  . Diabetes mellitus    Uncontrolled secondary to dietary noncompliance  . Diverticulosis   . DJD (degenerative joint disease)   . Hypertension    hypertensive syndrome with dyspnea -[facility], February, 2011 - EF 50% and cardiac bypass grafts all patent - responded to diuretics and blood pressure control - [name]  . Microscopic hematuria   . Onychomycosis   . Pneumonia 05/2017  . Prostate nodule   . PSVT (paroxysmal supraventricular tachycardia) ([place])   . Renal disorder   . UTI (urinary tract infection) 09/02/2017   sensitive to Ceftriaxone  . Vitamin B12 deficiency   . Vitamin D deficiency    Past Surgical History:  Procedure Laterality Date  . APPENDECTOMY    . CORONARY ARTERY BYPASS GRAFT      No Known Allergies  Outpatient Encounter Medications as of 05/16/2018  Medication Sig  . acetaminophen (TYLENOL) 325 MG tablet Take 650 mg by mouth every 6 (six) hours as needed for mild pain.  Marland Kitchen amLODipine (NORVASC) 5 MG tablet Take 5 mg by mouth daily.  Marland Kitchen aspirin EC 81 MG EC tablet Take 1 tablet (81 mg total) by mouth daily.  . bisacodyl (DULCOLAX) 10 MG suppository Place 10 mg rectally once as needed (FOR CONSTIPATION NOT RELIEVED BY MILK OF MAGNESIA).  . cephALEXin (KEFLEX) 500 MG capsule Take 1 capsule (500 mg total) by mouth 2 (two) times daily.  . cholecalciferol (  VITAMIN D) 1000 units tablet Take 1,000 Units by mouth daily.  . Fluticasone Propionate (FLONASE ALLERGY RELIEF NA) Place 2 sprays into the nose at bedtime. Each nostril for allergic rhinitis  . furosemide (LASIX) 40 MG tablet Take 1.5 tablets (60 mg total) by mouth daily.  . hydrALAZINE (APRESOLINE) 10 MG tablet Take 10 mg by mouth 2 (two) times daily.  . hydrocortisone cream 0.5 % Apply 1 application topically 2 (two) times daily. Apply to rash on right foot until healed  . insulin aspart (NOVOLOG FLEXPEN) 100  UNIT/ML FlexPen Inject 16-18 Units into the skin 3 (three) times daily with meals. 16 units with each meal, hold for CBG <100, call provider if CBG <70 or >200.  Give an additional 2 units if CBG over 300.  . Insulin Glargine (BASAGLAR KWIKPEN) 100 UNIT/ML SOPN Inject 20 Units into the skin at bedtime.   Marland Kitchen ipratropium-albuterol (DUONEB) 0.5-2.5 (3) MG/3ML SOLN Take 3 mLs by nebulization every 6 (six) hours as needed.  . isosorbide mononitrate (IMDUR) 30 MG 24 hr tablet Take 30 mg by mouth daily.   Marland Kitchen latanoprost (XALATAN) 0.005 % ophthalmic solution Place 1 drop into both eyes every evening.   . magnesium hydroxide (MILK OF MAGNESIA) 400 MG/5ML suspension Take 5 mLs by mouth daily as needed for mild constipation.  . metolazone (ZAROXOLYN) 2.5 MG tablet Take 1 tablet (2.5 mg total) by mouth as directed. Take 1 tablet tomorrow, Thursday and then Saturday of this week, then take every Tuesday and Saturday  . metoprolol (LOPRESSOR) 50 MG tablet TAKE 1 TABLET (50 MG TOTAL) BY MOUTH 2 (TWO) TIMES DAILY.  . Multiple Vitamin (MULTIVITAMIN) tablet Take 1 tablet by mouth daily.  . nitroGLYCERIN (NITROSTAT) 0.4 MG SL tablet Place 0.4 mg under the tongue every 5 (five) minutes x 3 doses as needed for chest pain.   . pantoprazole (PROTONIX) 40 MG tablet Take 40 mg by mouth daily.  . potassium chloride SA (K-DUR,KLOR-CON) 20 MEQ tablet Take 1 tablet (20 mEq total) by mouth 2 (two) times daily.  . rosuvastatin (CRESTOR) 10 MG tablet Take 10 mg by mouth. M-W-F  . sennosides-docusate sodium (SENOKOT-S) 8.6-50 MG tablet Take 2 tablets by mouth 2 (two) times daily.   . tamsulosin (FLOMAX) 0.4 MG CAPS capsule Take 0.4 mg by mouth at bedtime.   . vitamin B-12 (CYANOCOBALAMIN) 1000 MCG tablet Take 1,000 mcg by mouth daily.    No facility-administered encounter medications on file as of 05/16/2018.     Review of Systems  GENERAL: No change in appetite, no fatigue, no weight changes, no fever, chills or weakness MOUTH  and THROAT: Denies oral discomfort, gingival pain or bleeding RESPIRATORY: no cough, SOB, DOE, wheezing, hemoptysis CARDIAC: No chest pain, or palpitations, +edema GI: No abdominal pain, diarrhea, constipation, heart burn, nausea or vomiting GU: Denies dysuria, frequency, hematuria, incontinence, or discharge PSYCHIATRIC: Denies feelings of depression or anxiety. No report of hallucinations, insomnia, paranoia, or agitation   Immunization History  Administered Date(s) Administered  . Influenza-Unspecified 08/06/2015, 09/03/2016, 09/02/2017  . PPD Test 09/20/2015  . Pneumococcal Polysaccharide-23 08/06/2015  . Pneumococcal-Unspecified 09/03/2016  . Tdap 07/28/2015   Pertinent  Health Maintenance Due  Topic Date Due  . PNA vac Low Risk Adult (2 of 2 - PCV13) 01/10/2019 (Originally 09/03/2017)  . URINE MICROALBUMIN  11/17/2023 (Originally 12/10/2016)  . INFLUENZA VACCINE  06/16/2018  . HEMOGLOBIN A1C  09/16/2018  . OPHTHALMOLOGY EXAM  02/01/2019  . FOOT EXAM  03/17/2019   Fall  Risk  07/20/2017 01/05/2017 09/09/2016 08/24/2016 08/07/2016  Falls in the past year? No No No No No      Vitals:   05/16/18 1522  BP: 128/64  Pulse: 83  Resp: 20  Temp: 98.6 F (37 C)  TempSrc: Oral  SpO2: 97%  Weight: 191 lb 12.8 oz (87 kg)  Height: 5\' 10"  (1.778 m)   Body mass index is 27.52 kg/m.  Physical Exam  GENERAL APPEARANCE: Well nourished. In no acute distress. Normal body habitus SKIN:  Left foot 3rd toe ulcer with slight serous drainage MOUTH and THROAT: Lips are without lesions. Oral mucosa is moist and without lesions. Tongue is normal in shape, size, and color and without lesions RESPIRATORY: Breathing is even & unlabored, BS CTAB CARDIAC: RRR, no murmur,no extra heart sounds, Left foot 2+ edema GI: Abdomen soft, normal BS, no masses, no tenderness EXTREMITIES:  Able to move X 4 extremities, wears orthotic shoes PSYCHIATRIC: Alert and oriented X 3. Affect and behavior are  appropriate  Labs reviewed: Recent Labs    06/11/17 1907  06/23/17 0614 06/23/17 1441  06/25/17 0315 06/26/17 0416  01/13/18 1839  04/05/18 1544 04/29/18 05/10/18 1440  NA 127*   < >  --  138   < > 138 139   < > 139   < > 140 138 132*  K 5.0   < >  --  4.0   < > 3.6 3.8   < > 3.8   < > 4.0 3.2* 3.7  CL 93*   < >  --  106   < > 105 104   < > 105  --  102  --  88*  CO2 22   < >  --  21*   < > 24 24   < > 21*  --  22  --  25  GLUCOSE 275*   < >  --  195*   < > 139* 105*   < > 138*  --  192*  --  554*  BUN 127*   < >  --  41*   < > 30* 29*   < > 57*   < > 42* 69* 72*  CREATININE 4.14*   < >  --  2.11*   < > 1.87* 1.86*   < > 2.29*   < > 1.95* 2.4* 2.40*  CALCIUM 8.9   < >  --  8.6*   < > 8.7* 8.8*   < > 9.1  --  9.1  --  9.0  MG 1.7  --  1.9 1.8  --   --   --   --   --   --   --   --   --   PHOS  --   --  4.0  --   --  3.4 3.8  --   --   --   --   --   --    < > = values in this interval not displayed.   Recent Labs    06/22/17 1608 06/23/17 0108 06/23/17 1441 06/25/17 0315 06/26/17 0416 07/01/17 04/29/18  AST 27 23 22   --   --  13* 10*  ALT 26 23 25   --   --  19 7*  ALKPHOS 70 65 72  --   --  67 50  BILITOT 1.4* 1.5* 1.3*  --   --   --   --   PROT 7.0 6.4* 6.6  --   --   --   --  ALBUMIN 3.2* 3.0* 2.9* 3.0* 2.9*  --   --    Recent Labs    10/11/17 1520 01/13/18 1839 03/10/18 04/05/18 1544 04/29/18  WBC 8.4 6.7 8.9 9.2 9.1  NEUTROABS  --  5.2 7  --  6  HGB 10.5* 15.4 10.6* 10.5* 11.4*  HCT 32.3* 45.9 32* 32.5* 34*  MCV 90 90.0  --  88  --   PLT 229 114* 191 200 175   Lab Results  Component Value Date   TSH 1.266 06/23/2017   Lab Results  Component Value Date   HGBA1C 8.2 03/16/2018   Lab Results  Component Value Date   CHOL 181 06/19/2017   HDL 49 06/19/2017   LDLCALC 117 06/19/2017   TRIG 75 06/19/2017   CHOLHDL 2.5 01/01/2010    Assessment/Plan  1. Uncontrolled type 2 diabetes mellitus with hyperglycemia (HCC) - CBGs are elevated, will increase  Basaglar from 20 units to 25 units SQ Q HS Lab Results  Component Value Date   HGBA1C 8.2 03/16/2018     2. Cellulitis of third toe of left foot - CBGs are elevated, continue Keflex till 05/20/18, start treatment by cleaning with NS, apply xeroform dressing and cover with dry dressing daily till healed   3. Primary osteoarthritis of right shoulder - continue Acetaminophen 325 mg give 2 tabs = 650 mg Q 6 hours PRN and start Biofreeze 4% gel topically to right shoulder BID    Family/ staff Communication: Discussed plan of care with resident.  Labs/tests ordered:  None  Goals of care:   Long-term care.   Durenda Age, NP [facility] [phone] (Monday-Friday 8:00 a.m. - 5:00 p.m.) [phone] (after hours)

## 2018-05-27 ENCOUNTER — Ambulatory Visit (INDEPENDENT_AMBULATORY_CARE_PROVIDER_SITE_OTHER): Payer: Medicare HMO | Admitting: Podiatry

## 2018-05-27 DIAGNOSIS — L97521 Non-pressure chronic ulcer of other part of left foot limited to breakdown of skin: Secondary | ICD-10-CM | POA: Diagnosis not present

## 2018-05-27 DIAGNOSIS — E11621 Type 2 diabetes mellitus with foot ulcer: Secondary | ICD-10-CM | POA: Diagnosis not present

## 2018-06-07 NOTE — Progress Notes (Addendum)
  Subjective:  Patient ID: Cole [name], male    DOB: [birth year],  MRN: [id number]  Chief Complaint  Patient presents with  . Diabetic Ulcer    rm 10    Wound Care, Left 3rd toe   84 y.o. male returns for diabetic foot care. 3rd toe improved.  Objective:   General AA&O x3. Normal mood and affect.  Vascular Dorsalis pedis pulses present 1+ bilaterally  Posterior tibial pulses absent bilaterally  Capillary refill normal to all digits. Pedal hair growth normal.  Neurologic Epicritic sensation present bilaterally. Protective sensation with 5.07 monofilament  present bilaterally. Vibratory sensation present bilaterally.  Dermatologic No open lesions. Interspaces clear of maceration.  Normal skin temperature and turgor. Hyperkeratotic lesions: None bilaterally. Nails: brittle, onychomycosis, thickening, elongation R 2nd toe superficial ulcer over the proximal interphalangeal joint with purulent exudate but no probe to bone.  Orthopedic: No history of amputation. MMT 5/5 in dorsiflexion, plantarflexion, inversion, and eversion. Normal lower extremity joint ROM without pain or crepitus.     Assessment & Plan:  Patient was evaluated and treated and all questions answered.  R 2nd toe ulcer -Improving. -No debridement today. -Medihoney and dressing applied.  Return in about 2 weeks (around 06/10/2018) for R 2nd Toe f/u.  Needs x-ray

## 2018-06-10 ENCOUNTER — Non-Acute Institutional Stay (SKILLED_NURSING_FACILITY): Payer: Medicare HMO | Admitting: Adult Health

## 2018-06-10 ENCOUNTER — Encounter: Payer: Self-pay | Admitting: Internal Medicine

## 2018-06-10 ENCOUNTER — Encounter: Payer: Self-pay | Admitting: Adult Health

## 2018-06-10 DIAGNOSIS — I5042 Chronic combined systolic (congestive) and diastolic (congestive) heart failure: Secondary | ICD-10-CM | POA: Diagnosis not present

## 2018-06-10 DIAGNOSIS — N400 Enlarged prostate without lower urinary tract symptoms: Secondary | ICD-10-CM | POA: Diagnosis not present

## 2018-06-10 DIAGNOSIS — E538 Deficiency of other specified B group vitamins: Secondary | ICD-10-CM | POA: Diagnosis not present

## 2018-06-10 DIAGNOSIS — E1129 Type 2 diabetes mellitus with other diabetic kidney complication: Secondary | ICD-10-CM | POA: Diagnosis not present

## 2018-06-10 DIAGNOSIS — IMO0002 Reserved for concepts with insufficient information to code with codable children: Secondary | ICD-10-CM

## 2018-06-10 DIAGNOSIS — E1165 Type 2 diabetes mellitus with hyperglycemia: Secondary | ICD-10-CM | POA: Diagnosis not present

## 2018-06-10 DIAGNOSIS — I25709 Atherosclerosis of coronary artery bypass graft(s), unspecified, with unspecified angina pectoris: Secondary | ICD-10-CM

## 2018-06-10 NOTE — Progress Notes (Signed)
Location:  [name] Room Number: 323-A Place of Service:  SNF (31) Provider:  Durenda Age, NP  Patient Care Team: [name], MD as PCP - General (Internal Medicine) Medina-Vargas, Senaida Lange, NP as Nurse Practitioner (Internal Medicine)  Extended Emergency Contact Information Primary Emergency Contact: [name] [place] of [place] Phone: [phone] Mobile Phone: [phone] Relation: Son Secondary Emergency Contact: [name] [place] of [place] Phone: [phone] Mobile Phone: [phone] Relation: Daughter  Code Status:  Full Code  Goals of care: Advanced Directive information Advanced Directives 01/13/2018  Does Patient Have a Medical Advance Directive? No  Type of Advance Directive -  Does patient want to make changes to medical advance directive? -  Copy of [place] in Chart? -  Would patient like information on creating a medical advance directive? No - Patient declined     Chief Complaint  Patient presents with  . Medical Management of Chronic Issues    The patient is seen for a routine [facility] visit    HPI:  Pt is a 84 y.o. Cole [name] seen today for medical management of chronic diseases.  He is a long-term care resident of [facility].  He has a PMH of diabetes with HLD, hypertension, and CAD status post CABG. He was seen in the room today. His Basaglar insulin dosage was recently increased from 20 units to 25 units. Latest CBGs are 232, 406, 269, 164, 294. He has recently finished antibiotic course for left 3rd to infection. He verbalized no concerns.   Past Medical History:  Diagnosis Date  . Actinic keratitis   . CHF (congestive heart failure) ([place])   . Claudication ([place])   . Coronary artery disease    status post CABG, 1999  . Diabetes mellitus    Uncontrolled secondary to dietary noncompliance  . Diverticulosis   . DJD (degenerative  joint disease)   . Hypertension    hypertensive syndrome with dyspnea -[facility], February, 2011 - EF 50% and cardiac bypass grafts all patent - responded to diuretics and blood pressure control - [name]  . Microscopic hematuria   . Onychomycosis   . Pneumonia 05/2017  . Prostate nodule   . PSVT (paroxysmal supraventricular tachycardia) ([place])   . Renal disorder   . UTI (urinary tract infection) 09/02/2017   sensitive to Ceftriaxone  . Vitamin B12 deficiency   . Vitamin D deficiency    Past Surgical History:  Procedure Laterality Date  . APPENDECTOMY    . CORONARY ARTERY BYPASS GRAFT      No Known Allergies  Outpatient Encounter Medications as of 06/10/2018  Medication Sig  . acetaminophen (TYLENOL) 325 MG tablet Take 650 mg by mouth every 6 (six) hours as needed for mild pain.  Marland Kitchen amLODipine (NORVASC) 5 MG tablet Take 5 mg by mouth daily.  Marland Kitchen aspirin EC 81 MG EC tablet Take 1 tablet (81 mg total) by mouth daily.  . bisacodyl (DULCOLAX) 10 MG suppository Place 10 mg rectally once as needed (FOR CONSTIPATION NOT RELIEVED BY MILK OF MAGNESIA).  Marland Kitchen cholecalciferol (VITAMIN D) 1000 units tablet Take 1,000 Units by mouth daily.  . Fluticasone Propionate (FLONASE ALLERGY RELIEF NA) Place 2 sprays into the nose at bedtime. Each nostril for allergic rhinitis  . furosemide (LASIX) 80 MG tablet Take 80 mg by mouth daily.  . hydrALAZINE (APRESOLINE) 10 MG tablet Take 10 mg by mouth 2 (two) times daily.  Marland Kitchen  hydrocortisone cream 0.5 % Apply 1 application topically 2 (two) times daily. Apply to rash on right foot until healed  . insulin aspart (NOVOLOG FLEXPEN) 100 UNIT/ML FlexPen Inject 16 Units into the skin 3 (three) times daily with meals. 16 units with each meal, hold for CBG <100, call provider if CBG <70 or >200.  Give an additional 2 units if CBG over 300.   . Insulin Glargine (BASAGLAR KWIKPEN) 100 UNIT/ML SOPN Inject 25 Units into the skin at bedtime.   Marland Kitchen  ipratropium-albuterol (DUONEB) 0.5-2.5 (3) MG/3ML SOLN Take 3 mLs by nebulization every 6 (six) hours as needed.  . isosorbide mononitrate (IMDUR) 30 MG 24 hr tablet Take 30 mg by mouth daily.   Marland Kitchen latanoprost (XALATAN) 0.005 % ophthalmic solution Place 1 drop into both eyes every evening.   . magnesium hydroxide (MILK OF MAGNESIA) 400 MG/5ML suspension Take 5 mLs by mouth daily as needed for mild constipation.  . metolazone (ZAROXOLYN) 2.5 MG tablet Take 1 tablet (2.5 mg total) by mouth as directed. Take 1 tablet tomorrow, Thursday and then Saturday of this week, then take every Tuesday and Saturday  . metoprolol (LOPRESSOR) 50 MG tablet TAKE 1 TABLET (50 MG TOTAL) BY MOUTH 2 (TWO) TIMES DAILY.  . Multiple Vitamin (MULTIVITAMIN) tablet Take 1 tablet by mouth daily.  . nitroGLYCERIN (NITROSTAT) 0.4 MG SL tablet Place 0.4 mg under the tongue every 5 (five) minutes x 3 doses as needed for chest pain.   . pantoprazole (PROTONIX) 40 MG tablet Take 40 mg by mouth daily.  . potassium chloride SA (K-DUR,KLOR-CON) 20 MEQ tablet Take 1 tablet (20 mEq total) by mouth 2 (two) times daily.  . rosuvastatin (CRESTOR) 10 MG tablet Take 10 mg by mouth. M-W-F  . sennosides-docusate sodium (SENOKOT-S) 8.6-50 MG tablet Take 2 tablets by mouth 2 (two) times daily.   . tamsulosin (FLOMAX) 0.4 MG CAPS capsule Take 0.4 mg by mouth at bedtime.   . vitamin B-12 (CYANOCOBALAMIN) 1000 MCG tablet Take 1,000 mcg by mouth daily.    No facility-administered encounter medications on file as of 06/10/2018.     Review of Systems  GENERAL: No change in appetite, no fatigue, no weight changes, no fever, chills or weakness MOUTH and THROAT: Denies oral discomfort, gingival pain or bleeding, pain from teeth or hoarseness   RESPIRATORY: no cough, SOB, DOE, wheezing, hemoptysis CARDIAC: No chest pain, or palpitations GI: No abdominal pain, diarrhea, constipation, heart burn, nausea or vomiting GU: Denies dysuria, frequency,  hematuria, incontinence, or discharge PSYCHIATRIC: Denies feelings of depression or anxiety. No report of hallucinations, insomnia, paranoia, or agitation   Immunization History  Administered Date(s) Administered  . Influenza-Unspecified 08/06/2015, 09/03/2016, 09/02/2017  . PPD Test 09/20/2015  . Pneumococcal Polysaccharide-23 08/06/2015  . Pneumococcal-Unspecified 09/03/2016  . Tdap 07/28/2015   Pertinent  Health Maintenance Due  Topic Date Due  . PNA vac Low Risk Adult (2 of 2 - PCV13) 01/10/2019 (Originally 09/03/2017)  . URINE MICROALBUMIN  11/17/2023 (Originally 12/10/2016)  . INFLUENZA VACCINE  06/16/2018  . HEMOGLOBIN A1C  09/16/2018  . OPHTHALMOLOGY EXAM  02/01/2019  . FOOT EXAM  03/17/2019   Fall Risk  07/20/2017 01/05/2017 09/09/2016 08/24/2016 08/07/2016  Falls in the past year? No No No No No      Vitals:   06/10/18 0928  BP: 118/68  Pulse: 60  Resp: 18  Temp: 98.5 F (36.9 C)  TempSrc: Oral  SpO2: 97%  Weight: 197 lb 12.8 oz (89.7 kg)  Height:  5\' 10"  (1.778 m)   Body mass index is 28.38 kg/m.  Physical Exam  GENERAL APPEARANCE: Well nourished. In no acute distress. Normal body habitus SKIN:  Left shin wound with dressing, no redness  MOUTH and THROAT: Lips are without lesions. Oral mucosa is moist and without lesions. Tongue is normal in shape, size, and color and without lesions RESPIRATORY: Breathing is even & unlabored, BS CTAB CARDIAC: RRR, no murmur,no extra heart sounds, LLE 1-2+ edema GI: Abdomen soft, normal BS, no masses, no tenderness EXTREMITIES:  Able to move X 4 extremities PSYCHIATRIC: Alert and oriented X 3. Affect and behavior are appropriate   Labs reviewed: Recent Labs    06/11/17 1907  06/23/17 0614 06/23/17 1441  06/25/17 0315 06/26/17 0416  01/13/18 1839  04/05/18 1544 04/29/18 05/10/18 1440  NA 127*   < >  --  138   < > 138 139   < > 139   < > 140 138 132*  K 5.0   < >  --  4.0   < > 3.6 3.8   < > 3.8   < > 4.0 3.2* 3.7    CL 93*   < >  --  106   < > 105 104   < > 105  --  102  --  88*  CO2 22   < >  --  21*   < > 24 24   < > 21*  --  22  --  25  GLUCOSE 275*   < >  --  195*   < > 139* 105*   < > 138*  --  192*  --  554*  BUN 127*   < >  --  41*   < > 30* 29*   < > Cole*   < > 42* 69* 72*  CREATININE 4.14*   < >  --  2.11*   < > 1.87* 1.86*   < > 2.29*   < > 1.95* 2.4* 2.40*  CALCIUM 8.9   < >  --  8.6*   < > 8.7* 8.8*   < > 9.1  --  9.1  --  9.0  MG 1.7  --  1.9 1.8  --   --   --   --   --   --   --   --   --   PHOS  --   --  4.0  --   --  3.4 3.8  --   --   --   --   --   --    < > = values in this interval not displayed.   Recent Labs    06/22/17 1608 06/23/17 0108 06/23/17 1441 06/25/17 0315 06/26/17 0416 07/01/17 04/29/18  AST 27 23 22   --   --  13* 10*  ALT 26 23 25   --   --  19 7*  ALKPHOS 70 65 72  --   --  67 50  BILITOT 1.4* 1.5* 1.3*  --   --   --   --   PROT 7.0 6.4* 6.6  --   --   --   --   ALBUMIN 3.2* 3.0* 2.9* 3.0* 2.9*  --   --    Recent Labs    10/11/17 1520 01/13/18 1839 03/10/18 04/05/18 1544 04/29/18  WBC 8.4 6.7 8.9 9.2 9.1  NEUTROABS  --  5.2 7  --  6  HGB 10.5* 15.4 10.6*  10.5* 11.4*  HCT 32.3* 45.9 32* 32.5* 34*  MCV 90 90.0  --  88  --   PLT 229 114* 191 200 175   Lab Results  Component Value Date   TSH 1.266 06/23/2017   Lab Results  Component Value Date   HGBA1C 8.2 03/16/2018   Lab Results  Component Value Date   CHOL 181 06/19/2017   HDL 49 06/19/2017   LDLCALC 117 06/19/2017   TRIG 75 06/19/2017   CHOLHDL 2.5 01/01/2010     Assessment/Plan  1. Coronary artery disease involving coronary bypass graft of native heart with angina pectoris (HCC) - no complaints of chest pains, continue Isosorbide MN ER 30 mg daily, Rosuvastatin 10 mg 1 tab Q MWF, NTG PRN, Metoprolol tartrate 50 mg BID,ASA 81 mg daily   2. B12 deficiency - continue Vitamin B12 1,000 mcg 1 tab daily   3. Diabetes mellitus with renal manifestations, uncontrolled ([place]) - recently  increased Basaglar 100 units/ml from 20 units to 25 units Q HS,  Novolog 100 units/ml inject 16 units SQ with each meal Lab Results  Component Value Date   HGBA1C 8.2 03/16/2018     4. Chronic combined systolic and diastolic CHF (congestive heart failure) (HCC) - no SOB, weight is stable, continue Lasix 80 mg daily, Metolazone 2.5 mg Q Tuesday and Saturday   5. Benign prostatic hyperplasia without lower urinary tract symptoms - continue Tamsulosin 0.4 mg 1 capsule Q HS    Family/ staff Communication:  Discussed plan of care with resident.  Labs/tests ordered:  None  Goals of care:   Long-term care.   Durenda Age, NP [facility] [phone] (Monday-Friday 8:00 a.m. - 5:00 p.m.) [phone] (after hours)

## 2018-06-10 NOTE — Progress Notes (Signed)
Location:  [place] Room Number: 323-A Place of Service:  SNF (31) Provider:  Durenda Age, NP  Patient Care Team: [name], MD as PCP - General (Internal Medicine) Medina-Vargas, Senaida Lange, NP as Nurse Practitioner (Internal Medicine)  Extended Emergency Contact Information Primary Emergency Contact: [name] [place] of [place] Phone: [phone] Mobile Phone: [phone] Relation: Son Secondary Emergency Contact: [name] [place] of [place] Phone: [phone] Mobile Phone: [phone] Relation: Daughter  Code Status:  Full Code  Goals of care: Advanced Directive information Advanced Directives 01/13/2018  Does Patient Have a Medical Advance Directive? No  Type of Advance Directive -  Does patient want to make changes to medical advance directive? -  Copy of [place] in Chart? -  Would patient like information on creating a medical advance directive? No - Patient declined     Chief Complaint  Patient presents with  . Medical Management of Chronic Issues    The patient is seen for a routine [facility] visit    HPI:  Pt is a 84 y.o. male seen today for medical management of chronic diseases.  He is a long-term care resident of [facility].  He has a PMH of    Past Medical History:  Diagnosis Date  . Actinic keratitis   . CHF (congestive heart failure) ([place])   . Claudication ([place])   . Coronary artery disease    status post CABG, 1999  . Diabetes mellitus    Uncontrolled secondary to dietary noncompliance  . Diverticulosis   . DJD (degenerative joint disease)   . Hypertension    hypertensive syndrome with dyspnea -[facility], February, 2011 - EF 50% and cardiac bypass grafts all patent - responded to diuretics and blood pressure control - [name]  . Microscopic hematuria   . Onychomycosis   . Pneumonia 05/2017    . Prostate nodule   . PSVT (paroxysmal supraventricular tachycardia) ([place])   . Renal disorder   . UTI (urinary tract infection) 09/02/2017   sensitive to Ceftriaxone  . Vitamin B12 deficiency   . Vitamin D deficiency    Past Surgical History:  Procedure Laterality Date  . APPENDECTOMY    . CORONARY ARTERY BYPASS GRAFT      No Known Allergies  Outpatient Encounter Medications as of 06/10/2018  Medication Sig  . acetaminophen (TYLENOL) 325 MG tablet Take 650 mg by mouth every 6 (six) hours as needed for mild pain.  Marland Kitchen amLODipine (NORVASC) 5 MG tablet Take 5 mg by mouth daily.  Marland Kitchen aspirin EC 81 MG EC tablet Take 1 tablet (81 mg total) by mouth daily.  . bisacodyl (DULCOLAX) 10 MG suppository Place 10 mg rectally once as needed (FOR CONSTIPATION NOT RELIEVED BY MILK OF MAGNESIA).  Marland Kitchen cholecalciferol (VITAMIN D) 1000 units tablet Take 1,000 Units by mouth daily.  . Fluticasone Propionate (FLONASE ALLERGY RELIEF NA) Place 2 sprays into the nose at bedtime. Each nostril for allergic rhinitis  . furosemide (LASIX) 80 MG tablet Take 80 mg by mouth daily.  . hydrALAZINE (APRESOLINE) 10 MG tablet Take 10 mg by mouth 2 (two) times daily.  . hydrocortisone cream 0.5 % Apply 1 application topically 2 (two) times daily. Apply to rash on right foot until healed  . insulin aspart (NOVOLOG FLEXPEN) 100 UNIT/ML FlexPen Inject 16 Units into the skin 3 (three) times daily with meals. 16 units with each meal, hold for CBG <100,  call provider if CBG <70 or >200.  Give an additional 2 units if CBG over 300.   . Insulin Glargine (BASAGLAR KWIKPEN) 100 UNIT/ML SOPN Inject 25 Units into the skin at bedtime.   Marland Kitchen ipratropium-albuterol (DUONEB) 0.5-2.5 (3) MG/3ML SOLN Take 3 mLs by nebulization every 6 (six) hours as needed.  . isosorbide mononitrate (IMDUR) 30 MG 24 hr tablet Take 30 mg by mouth daily.   Marland Kitchen latanoprost (XALATAN) 0.005 % ophthalmic solution Place 1 drop into both eyes every evening.   . magnesium  hydroxide (MILK OF MAGNESIA) 400 MG/5ML suspension Take 5 mLs by mouth daily as needed for mild constipation.  . metolazone (ZAROXOLYN) 2.5 MG tablet Take 1 tablet (2.5 mg total) by mouth as directed. Take 1 tablet tomorrow, Thursday and then Saturday of this week, then take every Tuesday and Saturday  . metoprolol (LOPRESSOR) 50 MG tablet TAKE 1 TABLET (50 MG TOTAL) BY MOUTH 2 (TWO) TIMES DAILY.  . Multiple Vitamin (MULTIVITAMIN) tablet Take 1 tablet by mouth daily.  . nitroGLYCERIN (NITROSTAT) 0.4 MG SL tablet Place 0.4 mg under the tongue every 5 (five) minutes x 3 doses as needed for chest pain.   . pantoprazole (PROTONIX) 40 MG tablet Take 40 mg by mouth daily.  . potassium chloride SA (K-DUR,KLOR-CON) 20 MEQ tablet Take 1 tablet (20 mEq total) by mouth 2 (two) times daily.  . rosuvastatin (CRESTOR) 10 MG tablet Take 10 mg by mouth. M-W-F  . sennosides-docusate sodium (SENOKOT-S) 8.6-50 MG tablet Take 2 tablets by mouth 2 (two) times daily.   . tamsulosin (FLOMAX) 0.4 MG CAPS capsule Take 0.4 mg by mouth at bedtime.   . vitamin B-12 (CYANOCOBALAMIN) 1000 MCG tablet Take 1,000 mcg by mouth daily.    No facility-administered encounter medications on file as of 06/10/2018.     Review of Systems  GENERAL: No change in appetite, no fatigue, no weight changes, no fever, chills or weakness SKIN: Denies rash, itching, wounds, ulcer sores, or nail abnormalities EYES: Denies change in vision, dry eyes, eye pain, itching or discharge EARS: Denies change in hearing, ringing in ears, or earache NOSE: Denies nasal congestion or epistaxis MOUTH and THROAT: Denies oral discomfort, gingival pain or bleeding, pain from teeth or hoarseness   RESPIRATORY: no cough, SOB, DOE, wheezing, hemoptysis CARDIAC: No chest pain, edema or palpitations GI: No abdominal pain, diarrhea, constipation, heart burn, nausea or vomiting GU: Denies dysuria, frequency, hematuria, incontinence, or discharge MUSCULOSKELETAL:  Denies joint pain, muscle pain, back pain, restricted movement, or unusual weakness CIRCULATION: Denies claudication, edema of legs, varicosities, or cold extremities NEUROLOGICAL: Denies dizziness, syncope, numbness, or headache PSYCHIATRIC: Denies feelings of depression or anxiety. No report of hallucinations, insomnia, paranoia, or agitation ENDOCRINE: Denies polyphagia, polyuria, polydipsia, heat or cold intolerance HEME/LYMPH: Denies excessive bruising, petechia, enlarged lymph nodes, or bleeding problems IMMUNOLOGIC: Denies history of frequent infections, AIDS, or use of immunosuppressive agents   Immunization History  Administered Date(s) Administered  . Influenza-Unspecified 08/06/2015, 09/03/2016, 09/02/2017  . PPD Test 09/20/2015  . Pneumococcal Polysaccharide-23 08/06/2015  . Pneumococcal-Unspecified 09/03/2016  . Tdap 07/28/2015   Pertinent  Health Maintenance Due  Topic Date Due  . PNA vac Low Risk Adult (2 of 2 - PCV13) 01/10/2019 (Originally 09/03/2017)  . URINE MICROALBUMIN  11/17/2023 (Originally 12/10/2016)  . INFLUENZA VACCINE  06/16/2018  . HEMOGLOBIN A1C  09/16/2018  . OPHTHALMOLOGY EXAM  02/01/2019  . FOOT EXAM  03/17/2019   Fall Risk  07/20/2017 01/05/2017 09/09/2016 08/24/2016 08/07/2016  Falls in the past year? No No No No No      Vitals:   06/10/18 0846  BP: 118/68  Pulse: 60  Resp: 18  Temp: 98.5 F (36.9 C)  TempSrc: Oral  SpO2: 97%  Weight: 197 lb 12.8 oz (89.7 kg)  Height: 5\' 10"  (1.778 m)   Body mass index is 28.38 kg/m.  Physical Exam  GENERAL APPEARANCE: Well nourished. In no acute distress. Normal body habitus SKIN:  Skin is warm and dry. There are no suspicious lesions or rash HEAD: Normal in size and contour. No evidence of trauma EYES: Lids open and close normally. No blepharitis, entropion or ectropion. PERRL. Conjunctivae are clear and sclerae are white. Lenses are without opacity EARS: Pinnae are normal. Patient hears normal voice  tunes of the examiner MOUTH and THROAT: Lips are without lesions. Oral mucosa is moist and without lesions. Tongue is normal in shape, size, and color and without lesions NECK: supple, trachea midline, no neck masses, no thyroid tenderness, no thyromegaly LYMPHATICS: No LAN in the neck, no supraclavicular LAN RESPIRATORY: Breathing is even & unlabored, BS CTAB CARDIAC: RRR, no murmur,no extra heart sounds, no edema GI: Abdomen soft, normal BS, no masses, no tenderness, no hepatomegaly, no splenomegaly MUSCULOSKELETAL: No deformities. Movement at each extremity is full and painless. Strength is 5/5 at each extremity. Back is without kyphosis or scoliosis CIRCULATION: Pedal pulses are 2+. There is no edema of the legs, ankles and feet NEUROLOGICAL: There is no tremor. Speech is clear PSYCHIATRIC: Alert and oriented X 3. Affect and behavior are appropriate  Labs reviewed: Recent Labs    06/11/17 1907  06/23/17 0614 06/23/17 1441  06/25/17 0315 06/26/17 0416  01/13/18 1839  04/05/18 1544 04/29/18 05/10/18 1440  NA 127*   < >  --  138   < > 138 139   < > 139   < > 140 138 132*  K 5.0   < >  --  4.0   < > 3.6 3.8   < > 3.8   < > 4.0 3.2* 3.7  CL 93*   < >  --  106   < > 105 104   < > 105  --  102  --  88*  CO2 22   < >  --  21*   < > 24 24   < > 21*  --  22  --  25  GLUCOSE 275*   < >  --  195*   < > 139* 105*   < > 138*  --  192*  --  554*  BUN 127*   < >  --  41*   < > 30* 29*   < > 57*   < > 42* 69* 72*  CREATININE 4.14*   < >  --  2.11*   < > 1.87* 1.86*   < > 2.29*   < > 1.95* 2.4* 2.40*  CALCIUM 8.9   < >  --  8.6*   < > 8.7* 8.8*   < > 9.1  --  9.1  --  9.0  MG 1.7  --  1.9 1.8  --   --   --   --   --   --   --   --   --   PHOS  --   --  4.0  --   --  3.4 3.8  --   --   --   --   --   --    < > =  values in this interval not displayed.   Recent Labs    06/22/17 1608 06/23/17 0108 06/23/17 1441 06/25/17 0315 06/26/17 0416 07/01/17 04/29/18  AST 27 23 22   --   --  13* 10*    ALT 26 23 25   --   --  19 7*  ALKPHOS 70 65 72  --   --  67 50  BILITOT 1.4* 1.5* 1.3*  --   --   --   --   PROT 7.0 6.4* 6.6  --   --   --   --   ALBUMIN 3.2* 3.0* 2.9* 3.0* 2.9*  --   --    Recent Labs    10/11/17 1520 01/13/18 1839 03/10/18 04/05/18 1544 04/29/18  WBC 8.4 6.7 8.9 9.2 9.1  NEUTROABS  --  5.2 7  --  6  HGB 10.5* 15.4 10.6* 10.5* 11.4*  HCT 32.3* 45.9 32* 32.5* 34*  MCV 90 90.0  --  88  --   PLT 229 114* 191 200 175   Lab Results  Component Value Date   TSH 1.266 06/23/2017   Lab Results  Component Value Date   HGBA1C 8.2 03/16/2018   Lab Results  Component Value Date   CHOL 181 06/19/2017   HDL 49 06/19/2017   LDLCALC 117 06/19/2017   TRIG 75 06/19/2017   CHOLHDL 2.5 01/01/2010    Assessment/Plan    Family/ staff Communication:   Labs/tests ordered:    Goals of care:   Long-term care.   Durenda Age, NP [facility] [phone] (Monday-Friday 8:00 a.m. - 5:00 p.m.) [phone] (after hours)  This encounter was created in error - please disregard.

## 2018-06-16 ENCOUNTER — Ambulatory Visit: Payer: Medicare HMO | Admitting: Podiatry

## 2018-06-16 DIAGNOSIS — L97511 Non-pressure chronic ulcer of other part of right foot limited to breakdown of skin: Secondary | ICD-10-CM

## 2018-06-16 DIAGNOSIS — M2041 Other hammer toe(s) (acquired), right foot: Secondary | ICD-10-CM | POA: Diagnosis not present

## 2018-06-16 DIAGNOSIS — L97521 Non-pressure chronic ulcer of other part of left foot limited to breakdown of skin: Secondary | ICD-10-CM

## 2018-06-16 DIAGNOSIS — E11621 Type 2 diabetes mellitus with foot ulcer: Secondary | ICD-10-CM | POA: Diagnosis not present

## 2018-06-16 DIAGNOSIS — R609 Edema, unspecified: Secondary | ICD-10-CM

## 2018-06-16 DIAGNOSIS — R600 Localized edema: Secondary | ICD-10-CM

## 2018-06-16 DIAGNOSIS — I83029 Varicose veins of left lower extremity with ulcer of unspecified site: Secondary | ICD-10-CM

## 2018-06-16 DIAGNOSIS — I83892 Varicose veins of left lower extremities with other complications: Secondary | ICD-10-CM

## 2018-06-16 DIAGNOSIS — L97929 Non-pressure chronic ulcer of unspecified part of left lower leg with unspecified severity: Secondary | ICD-10-CM

## 2018-06-17 ENCOUNTER — Ambulatory Visit: Payer: Medicare HMO | Admitting: Endocrinology

## 2018-06-17 ENCOUNTER — Encounter: Payer: Self-pay | Admitting: Endocrinology

## 2018-06-17 VITALS — BP 122/42 | HR 60 | Ht 70.0 in | Wt 199.8 lb

## 2018-06-17 DIAGNOSIS — I70209 Unspecified atherosclerosis of native arteries of extremities, unspecified extremity: Secondary | ICD-10-CM | POA: Diagnosis not present

## 2018-06-17 DIAGNOSIS — E1151 Type 2 diabetes mellitus with diabetic peripheral angiopathy without gangrene: Secondary | ICD-10-CM

## 2018-06-17 LAB — POCT GLYCOSYLATED HEMOGLOBIN (HGB A1C): HEMOGLOBIN A1C: 9.6 % — AB (ref 4.0–5.6)

## 2018-06-17 MED ORDER — BASAGLAR KWIKPEN 100 UNIT/ML ~~LOC~~ SOPN: 12.0000 [IU] | PEN_INJECTOR | Freq: Every day | SUBCUTANEOUS | 11 refills | Status: DC

## 2018-06-17 MED ORDER — BASAGLAR KWIKPEN 100 UNIT/ML ~~LOC~~ SOPN: 20.0000 [IU] | PEN_INJECTOR | Freq: Every day | SUBCUTANEOUS | 11 refills | Status: DC

## 2018-06-17 MED ORDER — INSULIN ASPART 100 UNIT/ML FLEXPEN: 20.0000 [IU] | PEN_INJECTOR | Freq: Three times a day (TID) | SUBCUTANEOUS | 11 refills | Status: DC

## 2018-06-17 NOTE — Progress Notes (Signed)
Subjective:    Patient ID: Cole [name], male    DOB: July 06, 1920, 84 y.o.   MRN: [id number]  HPI Pt returns for f/u of diabetes mellitus:  DM type: Insulin-requiring type 2 Dx'ed: 0737 Complications: polyneuropathy, CAD, PAD, foot ulcers, and renal failure.   Therapy: insulin since mid-2018  DKA: never Severe hypoglycemia: never.  Pancreatitis: never Pancreatic imaging: not mentioned on 2017 [place] Other: he takes MDI; he lives at [facility], where staff gives insulin and checks cbg; history is mostly from son, due to memory loss.  Interval history: basaglar has been increased to 25 units qhs, and he takes ss-novolog.  Out office has [facility] to clarify med list, who have told us that pt is also getting scheduled novolog 16 units 3 times a day (just before each meal).  He brings a record of his cbg's which I have reviewed today.  It varies from 89-300's.  It is in general higher as the day goes on.   Past Medical History:  Diagnosis Date  . Actinic keratitis   . CHF (congestive heart failure) ([place])   . Claudication ([place])   . Coronary artery disease    status post CABG, 1999  . Diabetes mellitus    Uncontrolled secondary to dietary noncompliance  . Diverticulosis   . DJD (degenerative joint disease)   . Hypertension    hypertensive syndrome with dyspnea -[facility], February, 2011 - EF 50% and cardiac bypass grafts all patent - responded to diuretics and blood pressure control - [name]  . Microscopic hematuria   . Onychomycosis   . Pneumonia 05/2017  . Prostate nodule   . PSVT (paroxysmal supraventricular tachycardia) ([place])   . Renal disorder   . UTI (urinary tract infection) 09/02/2017   sensitive to Ceftriaxone  . Vitamin B12 deficiency   . Vitamin D deficiency     Past Surgical History:  Procedure Laterality Date  . APPENDECTOMY    . CORONARY ARTERY BYPASS GRAFT      Social History   Socioeconomic History  . Marital status:  Married    Spouse name: Not on file  . Number of children: Not on file  . Years of education: Not on file  . Highest education level: Not on file  Occupational History  . Not on file  Social Needs  . Financial resource strain: Not on file  . Food insecurity:    Worry: Not on file    Inability: Not on file  . Transportation needs:    Medical: Not on file    Non-medical: Not on file  Tobacco Use  . Smoking status: Former Smoker    Types: Cigars  . Smokeless tobacco: Never Used  . Tobacco comment: rarely smoked  Substance and Sexual Activity  . Alcohol use: No  . Drug use: No  . Sexual activity: Never  Lifestyle  . Physical activity:    Days per week: Not on file    Minutes per session: Not on file  . Stress: Not on file  Relationships  . Social connections:    Talks on phone: Not on file    Gets together: Not on file    Attends religious service: Not on file    Active member of club or organization: Not on file    Attends meetings of clubs or organizations: Not on file    Relationship status: Not on file  . Intimate partner violence:    Fear of current or  ex partner: Not on file    Emotionally abused: Not on file    Physically abused: Not on file    Forced sexual activity: Not on file  Other Topics Concern  . Not on file  Social History Narrative   Long-term resident of [facility].  Noncompliant with sodium restriction.    Current Outpatient Medications on File Prior to Visit  Medication Sig Dispense Refill  . acetaminophen (TYLENOL) 325 MG tablet Take 650 mg by mouth every 6 (six) hours as needed for mild pain.    Marland Kitchen amLODipine (NORVASC) 5 MG tablet Take 5 mg by mouth daily.    Marland Kitchen aspirin EC 81 MG EC tablet Take 1 tablet (81 mg total) by mouth daily.    . bisacodyl (DULCOLAX) 10 MG suppository Place 10 mg rectally once as needed (FOR CONSTIPATION NOT RELIEVED BY MILK OF MAGNESIA).    Marland Kitchen cholecalciferol (VITAMIN D) 1000 units tablet Take 1,000  Units by mouth daily.    . Fluticasone Propionate (FLONASE ALLERGY RELIEF NA) Place 2 sprays into the nose at bedtime. Each nostril for allergic rhinitis    . furosemide (LASIX) 80 MG tablet Take 80 mg by mouth daily.    . hydrALAZINE (APRESOLINE) 10 MG tablet Take 10 mg by mouth 2 (two) times daily.    . hydrocortisone cream 0.5 % Apply 1 application topically 2 (two) times daily. Apply to rash on right foot until healed    . ipratropium-albuterol (DUONEB) 0.5-2.5 (3) MG/3ML SOLN Take 3 mLs by nebulization every 6 (six) hours as needed.    . isosorbide mononitrate (IMDUR) 30 MG 24 hr tablet Take 30 mg by mouth daily.     Marland Kitchen latanoprost (XALATAN) 0.005 % ophthalmic solution Place 1 drop into both eyes every evening.     . magnesium hydroxide (MILK OF MAGNESIA) 400 MG/5ML suspension Take 5 mLs by mouth daily as needed for mild constipation.    . metolazone (ZAROXOLYN) 2.5 MG tablet Take 1 tablet (2.5 mg total) by mouth as directed. Take 1 tablet tomorrow, Thursday and then Saturday of this week, then take every Tuesday and Saturday 10 tablet 3  . metoprolol (LOPRESSOR) 50 MG tablet TAKE 1 TABLET (50 MG TOTAL) BY MOUTH 2 (TWO) TIMES DAILY. 60 tablet 5  . Multiple Vitamin (MULTIVITAMIN) tablet Take 1 tablet by mouth daily.    . nitroGLYCERIN (NITROSTAT) 0.4 MG SL tablet Place 0.4 mg under the tongue every 5 (five) minutes x 3 doses as needed for chest pain.     . pantoprazole (PROTONIX) 40 MG tablet Take 40 mg by mouth daily.    . potassium chloride SA (K-DUR,KLOR-CON) 20 MEQ tablet Take 1 tablet (20 mEq total) by mouth 2 (two) times daily.    . rosuvastatin (CRESTOR) 10 MG tablet Take 10 mg by mouth. M-W-F    . sennosides-docusate sodium (SENOKOT-S) 8.6-50 MG tablet Take 2 tablets by mouth 2 (two) times daily.     . tamsulosin (FLOMAX) 0.4 MG CAPS capsule Take 0.4 mg by mouth at bedtime.     . vitamin B-12 (CYANOCOBALAMIN) 1000 MCG tablet Take 1,000 mcg by mouth daily.      No current  facility-administered medications on file prior to visit.     No Known Allergies  Family History  Problem Relation Age of Onset  . Asthma Neg Hx   . COPD Neg Hx   . Diabetes Neg Hx     BP (!) 122/42 (BP Location: Right Arm, Patient  Position: Sitting, Cuff Size: Normal)   Pulse 60   Ht 5\' 10"  (1.778 m)   Wt 199 lb 12.8 oz (90.6 kg)   SpO2 97%   BMI 28.67 kg/m   Review of Systems He denies hypoglycemia    Objective:   Physical Exam VITAL SIGNS:  See vs page GENERAL: no distress Pulses: right foot pulses are intact.   MSK: no deformity of the right foot or ankle CV: 1+ edema of the right leg Skin:  no ulcer on the right foot or ankle.  normal color and temp on the right foot and ankle, but decreased from normal Neuro: sensation is intact to touch on the feet and ankles.   Ext: Left foot is in a boot. onychomycosis of the right foot toenails.     Lab Results  Component Value Date   HGBA1C 9.6 (A) 06/17/2018       Assessment & Plan:  Insulin-requiring type 2 DM, with PAD: worse.   Frail elderly state: he is not a candidate for aggressive glycemic control.  Patient Instructions  check resident's blood sugar 4 times a day: before the 3 meals, and at bedtime.  also check if you have symptoms of your blood sugar being too high or too low.  please keep a record of the readings and bring it to your next appointment here (or you can bring the meter itself).  You can write it on any piece of paper.  please call us sooner if your blood sugar goes below 70, or if most cbg's are over 200.  Take novolog, 20 units 3 times a day (with each meal), and: Reduce basaglar to 20 units at bedtime.  Also, take extra novolog, 2 units for any cbg over 300.  Please fax cbg record in 1-2 weeks.  Please come back for a follow-up appointment in 3 months.

## 2018-06-17 NOTE — Patient Instructions (Addendum)
check resident's blood sugar 4 times a day: before the 3 meals, and at bedtime.  also check if you have symptoms of your blood sugar being too high or too low.  please keep a record of the readings and bring it to your next appointment here (or you can bring the meter itself).  You can write it on any piece of paper.  please call us sooner if your blood sugar goes below 70, or if most cbg's are over 200.  Take novolog, 20 units 3 times a day (with each meal), and: Reduce basaglar to 20 units at bedtime.  Also, take extra novolog, 2 units for any cbg over 300.  Please fax cbg record in 1-2 weeks.  Please come back for a follow-up appointment in 3 months.

## 2018-06-20 ENCOUNTER — Other Ambulatory Visit: Payer: Self-pay

## 2018-06-20 ENCOUNTER — Encounter (HOSPITAL_COMMUNITY): Payer: Self-pay | Admitting: Emergency Medicine

## 2018-06-20 ENCOUNTER — Inpatient Hospital Stay (HOSPITAL_COMMUNITY)
Admission: EM | Admit: 2018-06-20 | Discharge: 2018-06-22 | DRG: 281 | Disposition: A | Discharge status: Skilled Nursing Facility | Payer: Medicare HMO | Attending: Internal Medicine | Admitting: Internal Medicine

## 2018-06-20 ENCOUNTER — Emergency Department (HOSPITAL_COMMUNITY): Payer: Medicare HMO

## 2018-06-20 DIAGNOSIS — I248 Other forms of acute ischemic heart disease: Secondary | ICD-10-CM | POA: Diagnosis present

## 2018-06-20 DIAGNOSIS — I441 Atrioventricular block, second degree: Secondary | ICD-10-CM

## 2018-06-20 DIAGNOSIS — I5042 Chronic combined systolic (congestive) and diastolic (congestive) heart failure: Secondary | ICD-10-CM | POA: Diagnosis not present

## 2018-06-20 DIAGNOSIS — I481 Persistent atrial fibrillation: Secondary | ICD-10-CM | POA: Diagnosis present

## 2018-06-20 DIAGNOSIS — I4819 Other persistent atrial fibrillation: Secondary | ICD-10-CM | POA: Diagnosis present

## 2018-06-20 DIAGNOSIS — R0902 Hypoxemia: Secondary | ICD-10-CM | POA: Diagnosis not present

## 2018-06-20 DIAGNOSIS — Z87440 Personal history of urinary (tract) infections: Secondary | ICD-10-CM

## 2018-06-20 DIAGNOSIS — I499 Cardiac arrhythmia, unspecified: Secondary | ICD-10-CM | POA: Diagnosis not present

## 2018-06-20 DIAGNOSIS — N184 Chronic kidney disease, stage 4 (severe): Secondary | ICD-10-CM

## 2018-06-20 DIAGNOSIS — E785 Hyperlipidemia, unspecified: Secondary | ICD-10-CM | POA: Diagnosis not present

## 2018-06-20 DIAGNOSIS — E876 Hypokalemia: Secondary | ICD-10-CM | POA: Diagnosis present

## 2018-06-20 DIAGNOSIS — I10 Essential (primary) hypertension: Secondary | ICD-10-CM | POA: Diagnosis not present

## 2018-06-20 DIAGNOSIS — N183 Chronic kidney disease, stage 3 unspecified: Secondary | ICD-10-CM | POA: Diagnosis present

## 2018-06-20 DIAGNOSIS — Z7951 Long term (current) use of inhaled steroids: Secondary | ICD-10-CM

## 2018-06-20 DIAGNOSIS — I959 Hypotension, unspecified: Secondary | ICD-10-CM | POA: Diagnosis not present

## 2018-06-20 DIAGNOSIS — E1151 Type 2 diabetes mellitus with diabetic peripheral angiopathy without gangrene: Secondary | ICD-10-CM | POA: Diagnosis present

## 2018-06-20 DIAGNOSIS — N179 Acute kidney failure, unspecified: Secondary | ICD-10-CM | POA: Diagnosis present

## 2018-06-20 DIAGNOSIS — R7989 Other specified abnormal findings of blood chemistry: Secondary | ICD-10-CM

## 2018-06-20 DIAGNOSIS — E1169 Type 2 diabetes mellitus with other specified complication: Secondary | ICD-10-CM | POA: Diagnosis not present

## 2018-06-20 DIAGNOSIS — R0789 Other chest pain: Secondary | ICD-10-CM | POA: Diagnosis not present

## 2018-06-20 DIAGNOSIS — Z9111 Patient's noncompliance with dietary regimen: Secondary | ICD-10-CM

## 2018-06-20 DIAGNOSIS — F0390 Unspecified dementia without behavioral disturbance: Secondary | ICD-10-CM | POA: Diagnosis present

## 2018-06-20 DIAGNOSIS — E1122 Type 2 diabetes mellitus with diabetic chronic kidney disease: Secondary | ICD-10-CM | POA: Diagnosis present

## 2018-06-20 DIAGNOSIS — I130 Hypertensive heart and chronic kidney disease with heart failure and stage 1 through stage 4 chronic kidney disease, or unspecified chronic kidney disease: Secondary | ICD-10-CM | POA: Diagnosis present

## 2018-06-20 DIAGNOSIS — Z79899 Other long term (current) drug therapy: Secondary | ICD-10-CM

## 2018-06-20 DIAGNOSIS — R079 Chest pain, unspecified: Secondary | ICD-10-CM | POA: Diagnosis not present

## 2018-06-20 DIAGNOSIS — E1129 Type 2 diabetes mellitus with other diabetic kidney complication: Secondary | ICD-10-CM

## 2018-06-20 DIAGNOSIS — Z951 Presence of aortocoronary bypass graft: Secondary | ICD-10-CM

## 2018-06-20 DIAGNOSIS — Z87891 Personal history of nicotine dependence: Secondary | ICD-10-CM

## 2018-06-20 DIAGNOSIS — I214 Non-ST elevation (NSTEMI) myocardial infarction: Principal | ICD-10-CM | POA: Diagnosis present

## 2018-06-20 DIAGNOSIS — Z7982 Long term (current) use of aspirin: Secondary | ICD-10-CM

## 2018-06-20 DIAGNOSIS — R778 Other specified abnormalities of plasma proteins: Secondary | ICD-10-CM

## 2018-06-20 DIAGNOSIS — I2510 Atherosclerotic heart disease of native coronary artery without angina pectoris: Secondary | ICD-10-CM | POA: Diagnosis present

## 2018-06-20 DIAGNOSIS — R748 Abnormal levels of other serum enzymes: Secondary | ICD-10-CM | POA: Diagnosis not present

## 2018-06-20 DIAGNOSIS — R0602 Shortness of breath: Secondary | ICD-10-CM | POA: Diagnosis not present

## 2018-06-20 DIAGNOSIS — E538 Deficiency of other specified B group vitamins: Secondary | ICD-10-CM | POA: Diagnosis present

## 2018-06-20 DIAGNOSIS — E1165 Type 2 diabetes mellitus with hyperglycemia: Secondary | ICD-10-CM | POA: Diagnosis not present

## 2018-06-20 DIAGNOSIS — Z66 Do not resuscitate: Secondary | ICD-10-CM | POA: Diagnosis present

## 2018-06-20 DIAGNOSIS — IMO0002 Reserved for concepts with insufficient information to code with codable children: Secondary | ICD-10-CM

## 2018-06-20 DIAGNOSIS — Z794 Long term (current) use of insulin: Secondary | ICD-10-CM

## 2018-06-20 HISTORY — DX: Burn of unspecified body region, unspecified degree: T30.0

## 2018-06-20 LAB — CBC
HEMATOCRIT: 33.8 % — AB (ref 39.0–52.0)
Hemoglobin: 11 g/dL — ABNORMAL LOW (ref 13.0–17.0)
MCH: 29.3 pg (ref 26.0–34.0)
MCHC: 32.5 g/dL (ref 30.0–36.0)
MCV: 89.9 fL (ref 78.0–100.0)
PLATELETS: 199 10*3/uL (ref 150–400)
RBC: 3.76 MIL/uL — ABNORMAL LOW (ref 4.22–5.81)
RDW: 13.7 % (ref 11.5–15.5)
WBC: 17.1 10*3/uL — AB (ref 4.0–10.5)

## 2018-06-20 LAB — BASIC METABOLIC PANEL
ANION GAP: 16 — AB (ref 5–15)
BUN: 71 mg/dL — ABNORMAL HIGH (ref 8–23)
CALCIUM: 9.6 mg/dL (ref 8.9–10.3)
CO2: 28 mmol/L (ref 22–32)
Chloride: 95 mmol/L — ABNORMAL LOW (ref 98–111)
Creatinine, Ser: 2.58 mg/dL — ABNORMAL HIGH (ref 0.61–1.24)
GFR calc Af Amer: 23 mL/min — ABNORMAL LOW (ref >60)
GFR, EST NON AFRICAN AMERICAN: 20 mL/min — AB (ref >60)
Glucose, Bld: 252 mg/dL — ABNORMAL HIGH (ref 70–99)
POTASSIUM: 3.1 mmol/L — AB (ref 3.5–5.1)
SODIUM: 139 mmol/L (ref 135–145)

## 2018-06-20 LAB — I-STAT TROPONIN, ED: TROPONIN I, POC: 1.7 ng/mL — AB (ref 0.00–0.08)

## 2018-06-20 LAB — TROPONIN I
TROPONIN I: 2.21 ng/mL — AB (ref <0.03)
Troponin I: 3.53 ng/mL (ref <0.03)

## 2018-06-20 LAB — TSH: TSH: 1.434 u[IU]/mL (ref 0.350–4.500)

## 2018-06-20 LAB — GLUCOSE, CAPILLARY: Glucose-Capillary: 343 mg/dL — ABNORMAL HIGH (ref 70–99)

## 2018-06-20 LAB — CBG MONITORING, ED: GLUCOSE-CAPILLARY: 276 mg/dL — AB (ref 70–99)

## 2018-06-20 LAB — HEPARIN LEVEL (UNFRACTIONATED): HEPARIN UNFRACTIONATED: 0.22 [IU]/mL — AB (ref 0.30–0.70)

## 2018-06-20 MED ORDER — POTASSIUM CHLORIDE CRYS ER 20 MEQ PO TBCR
20.0000 meq | EXTENDED_RELEASE_TABLET | Freq: Two times a day (BID) | ORAL | Status: DC
  Administered 2018-06-20 – 2018-06-22 (×4): 20 meq via ORAL
  Filled 2018-06-20 (×4): qty 1

## 2018-06-20 MED ORDER — INSULIN ASPART 100 UNIT/ML ~~LOC~~ SOLN
0.0000 [IU] | Freq: Three times a day (TID) | SUBCUTANEOUS | Status: DC
  Administered 2018-06-20: 5 [IU] via SUBCUTANEOUS
  Administered 2018-06-21: 2 [IU] via SUBCUTANEOUS
  Administered 2018-06-21: 3 [IU] via SUBCUTANEOUS
  Administered 2018-06-21: 5 [IU] via SUBCUTANEOUS
  Administered 2018-06-22: 3 [IU] via SUBCUTANEOUS
  Administered 2018-06-22: 2 [IU] via SUBCUTANEOUS

## 2018-06-20 MED ORDER — PANTOPRAZOLE SODIUM 40 MG PO TBEC
40.0000 mg | DELAYED_RELEASE_TABLET | Freq: Every day | ORAL | Status: DC
  Administered 2018-06-21 – 2018-06-22 (×2): 40 mg via ORAL
  Filled 2018-06-20 (×2): qty 1

## 2018-06-20 MED ORDER — ISOSORBIDE MONONITRATE ER 60 MG PO TB24
60.0000 mg | ORAL_TABLET | Freq: Every day | ORAL | Status: DC
  Administered 2018-06-21 – 2018-06-22 (×2): 60 mg via ORAL
  Filled 2018-06-20 (×2): qty 1

## 2018-06-20 MED ORDER — SODIUM CHLORIDE 0.9 % IV SOLN: 250.0000 mL | INTRAVENOUS | Status: DC | PRN

## 2018-06-20 MED ORDER — HYDROXYZINE HCL 25 MG PO TABS: 25.0000 mg | ORAL_TABLET | Freq: Three times a day (TID) | ORAL | Status: DC | PRN

## 2018-06-20 MED ORDER — IPRATROPIUM-ALBUTEROL 0.5-2.5 (3) MG/3ML IN SOLN: 3.0000 mL | Freq: Four times a day (QID) | RESPIRATORY_TRACT | Status: DC | PRN

## 2018-06-20 MED ORDER — FUROSEMIDE 80 MG PO TABS
80.0000 mg | ORAL_TABLET | Freq: Every day | ORAL | Status: DC
  Administered 2018-06-21: 80 mg via ORAL
  Filled 2018-06-20: qty 1

## 2018-06-20 MED ORDER — AMLODIPINE BESYLATE 5 MG PO TABS
5.0000 mg | ORAL_TABLET | Freq: Every day | ORAL | Status: DC
  Administered 2018-06-21 – 2018-06-22 (×2): 5 mg via ORAL
  Filled 2018-06-20 (×2): qty 1

## 2018-06-20 MED ORDER — ONDANSETRON HCL 4 MG PO TABS: 4.0000 mg | ORAL_TABLET | Freq: Four times a day (QID) | ORAL | Status: DC | PRN

## 2018-06-20 MED ORDER — ASPIRIN 81 MG PO TBEC: 81.0000 mg | DELAYED_RELEASE_TABLET | Freq: Every day | ORAL | Status: DC

## 2018-06-20 MED ORDER — CALCIUM CARBONATE ANTACID 1250 MG/5ML PO SUSP
500.0000 mg | Freq: Four times a day (QID) | ORAL | Status: DC | PRN
  Filled 2018-06-20: qty 5

## 2018-06-20 MED ORDER — LATANOPROST 0.005 % OP SOLN
1.0000 [drp] | Freq: Every day | OPHTHALMIC | Status: DC
  Administered 2018-06-20 – 2018-06-21 (×2): 1 [drp] via OPHTHALMIC
  Filled 2018-06-20: qty 2.5

## 2018-06-20 MED ORDER — HEPARIN BOLUS VIA INFUSION
4000.0000 [IU] | Freq: Once | INTRAVENOUS | Status: AC
  Administered 2018-06-20: 4000 [IU] via INTRAVENOUS
  Filled 2018-06-20: qty 4000

## 2018-06-20 MED ORDER — METOLAZONE 2.5 MG PO TABS
2.5000 mg | ORAL_TABLET | ORAL | Status: DC
  Administered 2018-06-21: 2.5 mg via ORAL
  Filled 2018-06-20: qty 1

## 2018-06-20 MED ORDER — HYDRALAZINE HCL 10 MG PO TABS
10.0000 mg | ORAL_TABLET | Freq: Two times a day (BID) | ORAL | Status: DC
  Administered 2018-06-20 – 2018-06-22 (×4): 10 mg via ORAL
  Filled 2018-06-20 (×5): qty 1

## 2018-06-20 MED ORDER — ISOSORBIDE MONONITRATE ER 30 MG PO TB24: 30.0000 mg | ORAL_TABLET | Freq: Every day | ORAL | Status: DC

## 2018-06-20 MED ORDER — ACETAMINOPHEN 325 MG PO TABS: 650.0000 mg | ORAL_TABLET | Freq: Four times a day (QID) | ORAL | Status: DC | PRN

## 2018-06-20 MED ORDER — ACETAMINOPHEN 650 MG RE SUPP: 650.0000 mg | Freq: Four times a day (QID) | RECTAL | Status: DC | PRN

## 2018-06-20 MED ORDER — SENNA-DOCUSATE SODIUM 8.6-50 MG PO TABS: 2.0000 | ORAL_TABLET | Freq: Two times a day (BID) | ORAL | Status: DC

## 2018-06-20 MED ORDER — SODIUM CHLORIDE 0.9% FLUSH
3.0000 mL | Freq: Two times a day (BID) | INTRAVENOUS | Status: DC
  Administered 2018-06-21 – 2018-06-22 (×3): 3 mL via INTRAVENOUS

## 2018-06-20 MED ORDER — BISACODYL 10 MG RE SUPP: 10.0000 mg | Freq: Once | RECTAL | Status: DC | PRN

## 2018-06-20 MED ORDER — NITROGLYCERIN 0.4 MG SL SUBL: 0.4000 mg | SUBLINGUAL_TABLET | SUBLINGUAL | Status: DC | PRN

## 2018-06-20 MED ORDER — DOCUSATE SODIUM 283 MG RE ENEM
1.0000 | ENEMA | RECTAL | Status: DC | PRN
  Filled 2018-06-20: qty 1

## 2018-06-20 MED ORDER — ROSUVASTATIN CALCIUM 10 MG PO TABS
10.0000 mg | ORAL_TABLET | ORAL | Status: DC
  Administered 2018-06-20: 10 mg via ORAL
  Filled 2018-06-20: qty 1

## 2018-06-20 MED ORDER — NEPRO/CARBSTEADY PO LIQD
237.0000 mL | Freq: Three times a day (TID) | ORAL | Status: DC | PRN
  Filled 2018-06-20: qty 237

## 2018-06-20 MED ORDER — ONDANSETRON HCL 4 MG/2ML IJ SOLN: 4.0000 mg | Freq: Four times a day (QID) | INTRAMUSCULAR | Status: DC | PRN

## 2018-06-20 MED ORDER — TAMSULOSIN HCL 0.4 MG PO CAPS
0.4000 mg | ORAL_CAPSULE | Freq: Every day | ORAL | Status: DC
  Administered 2018-06-20 – 2018-06-21 (×2): 0.4 mg via ORAL
  Filled 2018-06-20 (×2): qty 1

## 2018-06-20 MED ORDER — ASPIRIN EC 81 MG PO TBEC
81.0000 mg | DELAYED_RELEASE_TABLET | Freq: Every day | ORAL | Status: DC
  Administered 2018-06-21 – 2018-06-22 (×2): 81 mg via ORAL
  Filled 2018-06-20 (×2): qty 1

## 2018-06-20 MED ORDER — SENNOSIDES-DOCUSATE SODIUM 8.6-50 MG PO TABS
2.0000 | ORAL_TABLET | Freq: Two times a day (BID) | ORAL | Status: DC
Start: 2018-06-20 — End: 2018-06-22
  Administered 2018-06-20 – 2018-06-22 (×4): 2 via ORAL
  Filled 2018-06-20 (×4): qty 2

## 2018-06-20 MED ORDER — METOPROLOL TARTRATE 50 MG PO TABS
50.0000 mg | ORAL_TABLET | Freq: Two times a day (BID) | ORAL | Status: DC
  Administered 2018-06-20 – 2018-06-21 (×2): 50 mg via ORAL
  Filled 2018-06-20 (×2): qty 1

## 2018-06-20 MED ORDER — ZOLPIDEM TARTRATE 5 MG PO TABS: 5.0000 mg | ORAL_TABLET | Freq: Every evening | ORAL | Status: DC | PRN

## 2018-06-20 MED ORDER — SORBITOL 70 % SOLN
30.0000 mL | Status: DC | PRN
  Filled 2018-06-20: qty 30

## 2018-06-20 MED ORDER — INSULIN GLARGINE 100 UNIT/ML ~~LOC~~ SOLN
20.0000 [IU] | Freq: Every day | SUBCUTANEOUS | Status: DC
  Administered 2018-06-20 – 2018-06-21 (×2): 20 [IU] via SUBCUTANEOUS
  Filled 2018-06-20 (×3): qty 0.2

## 2018-06-20 MED ORDER — SODIUM CHLORIDE 0.9% FLUSH: 3.0000 mL | INTRAVENOUS | Status: DC | PRN

## 2018-06-20 MED ORDER — HEPARIN (PORCINE) IN NACL 100-0.45 UNIT/ML-% IJ SOLN
1150.0000 [IU]/h | INTRAMUSCULAR | Status: DC
  Administered 2018-06-20: 1000 [IU]/h via INTRAVENOUS
  Administered 2018-06-21: 1150 [IU]/h via INTRAVENOUS
  Filled 2018-06-20 (×2): qty 250

## 2018-06-20 MED ORDER — CAMPHOR-MENTHOL 0.5-0.5 % EX LOTN
1.0000 "application " | TOPICAL_LOTION | Freq: Three times a day (TID) | CUTANEOUS | Status: DC | PRN
  Filled 2018-06-20: qty 222

## 2018-06-20 NOTE — ED Notes (Signed)
Heart Healthy Lunch Tray Ordered @ 1352-per RN-called by Levada Dy
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
## 2018-06-20 NOTE — ED Notes (Signed)
CARDS at bedside

## 2018-06-20 NOTE — ED Notes (Signed)
Critical I-stat troponin results given to Tawanna Solo, RN and Lacinda Axon, MD

## 2018-06-20 NOTE — ED Notes (Signed)
Patient denies pain and is resting comfortably.  

## 2018-06-20 NOTE — Progress Notes (Signed)
ANTICOAGULATION CONSULT NOTE - Initial Consult  Pharmacy Consult for heparin Indication: chest pain/ACS  No Known Allergies  Patient Measurements: Height: 5\' 8"  (172.7 cm) Weight: 190 lb (86.2 kg) IBW/kg (Calculated) : 68.4 Heparin Dosing Weight: 86 kg  Vital Signs: Temp: 97.7 F (36.5 C) (08/05 0912) Temp Source: Oral (08/05 0912) BP: 149/53 (08/05 1400) Pulse Rate: 73 (08/05 1330)  Labs: Recent Labs    06/20/18 1011  HGB 11.0*  HCT 33.8*  PLT 199  CREATININE 2.58*    Estimated Creatinine Clearance: 18.3 mL/min (A) (by C-G formula based on SCr of 2.58 mg/dL (H)).  Assessment: CC/HPI: 84 yo m presenting with SOB swelling  PMH: CAD, HF, DM, HTN, afib  Anticoag: none pta iv hep for r/o ACS  Renal: SCr 2.58  Heme/Onc: H&H 11/338, Plt 199   Goal of Therapy:  Heparin level 0.3-0.7 units/ml Monitor platelets by anticoagulation protocol: Yes   Plan:  Heparin bolus 4000 units x 1 Heparin gtt 1000 units/hr Initial HL 2200 Daily HL CBC F/U cards plans for hep duration since not a cath candidate  Levester Fresh, PharmD, BCPS, BCCCP Clinical Pharmacist [phone]  Please check AMION for all [place] numbers  06/20/2018 2:09 PM
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
## 2018-06-20 NOTE — ED Notes (Signed)
Patient denies chest pain this moment. He states "I was having chest pain before I come over here"
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
## 2018-06-20 NOTE — ED Triage Notes (Signed)
Patient arrived by EMS from [place]. He reports having chest pain since yesterday. Staff at [facility] gave PT meds:  8am -> 81 mg ASA , 1 Nitro, 650 mg  8:10am-> 1 Nitro When asked if the medication from [place] helped, patient states "I don't know"

## 2018-06-21 ENCOUNTER — Ambulatory Visit: Payer: Medicare HMO | Admitting: Nurse Practitioner

## 2018-06-21 ENCOUNTER — Other Ambulatory Visit: Payer: Self-pay

## 2018-06-21 DIAGNOSIS — E1169 Type 2 diabetes mellitus with other specified complication: Secondary | ICD-10-CM | POA: Diagnosis not present

## 2018-06-21 DIAGNOSIS — Z87440 Personal history of urinary (tract) infections: Secondary | ICD-10-CM | POA: Diagnosis not present

## 2018-06-21 DIAGNOSIS — Z7982 Long term (current) use of aspirin: Secondary | ICD-10-CM | POA: Diagnosis not present

## 2018-06-21 DIAGNOSIS — E1151 Type 2 diabetes mellitus with diabetic peripheral angiopathy without gangrene: Secondary | ICD-10-CM | POA: Diagnosis present

## 2018-06-21 DIAGNOSIS — I441 Atrioventricular block, second degree: Secondary | ICD-10-CM | POA: Diagnosis not present

## 2018-06-21 DIAGNOSIS — I10 Essential (primary) hypertension: Secondary | ICD-10-CM | POA: Diagnosis not present

## 2018-06-21 DIAGNOSIS — I214 Non-ST elevation (NSTEMI) myocardial infarction: Secondary | ICD-10-CM | POA: Diagnosis not present

## 2018-06-21 DIAGNOSIS — N179 Acute kidney failure, unspecified: Secondary | ICD-10-CM | POA: Diagnosis not present

## 2018-06-21 DIAGNOSIS — Z794 Long term (current) use of insulin: Secondary | ICD-10-CM | POA: Diagnosis not present

## 2018-06-21 DIAGNOSIS — E1122 Type 2 diabetes mellitus with diabetic chronic kidney disease: Secondary | ICD-10-CM | POA: Diagnosis not present

## 2018-06-21 DIAGNOSIS — Z9111 Patient's noncompliance with dietary regimen: Secondary | ICD-10-CM | POA: Diagnosis not present

## 2018-06-21 DIAGNOSIS — R748 Abnormal levels of other serum enzymes: Secondary | ICD-10-CM

## 2018-06-21 DIAGNOSIS — I481 Persistent atrial fibrillation: Secondary | ICD-10-CM | POA: Diagnosis not present

## 2018-06-21 DIAGNOSIS — E785 Hyperlipidemia, unspecified: Secondary | ICD-10-CM | POA: Diagnosis not present

## 2018-06-21 DIAGNOSIS — E1129 Type 2 diabetes mellitus with other diabetic kidney complication: Secondary | ICD-10-CM | POA: Diagnosis not present

## 2018-06-21 DIAGNOSIS — Z951 Presence of aortocoronary bypass graft: Secondary | ICD-10-CM | POA: Diagnosis not present

## 2018-06-21 DIAGNOSIS — N183 Chronic kidney disease, stage 3 (moderate): Secondary | ICD-10-CM | POA: Diagnosis not present

## 2018-06-21 DIAGNOSIS — E876 Hypokalemia: Secondary | ICD-10-CM | POA: Diagnosis present

## 2018-06-21 DIAGNOSIS — I130 Hypertensive heart and chronic kidney disease with heart failure and stage 1 through stage 4 chronic kidney disease, or unspecified chronic kidney disease: Secondary | ICD-10-CM | POA: Diagnosis not present

## 2018-06-21 DIAGNOSIS — F0390 Unspecified dementia without behavioral disturbance: Secondary | ICD-10-CM | POA: Diagnosis present

## 2018-06-21 DIAGNOSIS — E538 Deficiency of other specified B group vitamins: Secondary | ICD-10-CM | POA: Diagnosis present

## 2018-06-21 DIAGNOSIS — Z87891 Personal history of nicotine dependence: Secondary | ICD-10-CM | POA: Diagnosis not present

## 2018-06-21 DIAGNOSIS — Z66 Do not resuscitate: Secondary | ICD-10-CM | POA: Diagnosis not present

## 2018-06-21 DIAGNOSIS — E1165 Type 2 diabetes mellitus with hyperglycemia: Secondary | ICD-10-CM | POA: Diagnosis not present

## 2018-06-21 DIAGNOSIS — I2510 Atherosclerotic heart disease of native coronary artery without angina pectoris: Secondary | ICD-10-CM | POA: Diagnosis present

## 2018-06-21 DIAGNOSIS — I5042 Chronic combined systolic (congestive) and diastolic (congestive) heart failure: Secondary | ICD-10-CM | POA: Diagnosis not present

## 2018-06-21 DIAGNOSIS — I248 Other forms of acute ischemic heart disease: Secondary | ICD-10-CM | POA: Diagnosis present

## 2018-06-21 DIAGNOSIS — R0789 Other chest pain: Secondary | ICD-10-CM | POA: Diagnosis present

## 2018-06-21 LAB — BASIC METABOLIC PANEL
Anion gap: 13 (ref 5–15)
BUN: 68 mg/dL — AB (ref 8–23)
CHLORIDE: 91 mmol/L — AB (ref 98–111)
CO2: 32 mmol/L (ref 22–32)
Calcium: 9 mg/dL (ref 8.9–10.3)
Creatinine, Ser: 2.72 mg/dL — ABNORMAL HIGH (ref 0.61–1.24)
GFR calc Af Amer: 21 mL/min — ABNORMAL LOW (ref >60)
GFR calc non Af Amer: 18 mL/min — ABNORMAL LOW (ref >60)
GLUCOSE: 262 mg/dL — AB (ref 70–99)
POTASSIUM: 3 mmol/L — AB (ref 3.5–5.1)
SODIUM: 136 mmol/L (ref 135–145)

## 2018-06-21 LAB — CBC
HEMATOCRIT: 31.5 % — AB (ref 39.0–52.0)
Hemoglobin: 10.2 g/dL — ABNORMAL LOW (ref 13.0–17.0)
MCH: 28.7 pg (ref 26.0–34.0)
MCHC: 32.4 g/dL (ref 30.0–36.0)
MCV: 88.5 fL (ref 78.0–100.0)
Platelets: 206 10*3/uL (ref 150–400)
RBC: 3.56 MIL/uL — ABNORMAL LOW (ref 4.22–5.81)
RDW: 13.7 % (ref 11.5–15.5)
WBC: 12.9 10*3/uL — ABNORMAL HIGH (ref 4.0–10.5)

## 2018-06-21 LAB — MRSA PCR SCREENING: MRSA by PCR: POSITIVE — AB

## 2018-06-21 LAB — LIPID PANEL
CHOL/HDL RATIO: 3.1 ratio
CHOLESTEROL: 119 mg/dL (ref 0–200)
HDL: 38 mg/dL — ABNORMAL LOW (ref >40)
LDL Cholesterol: 62 mg/dL (ref 0–99)
Triglycerides: 95 mg/dL (ref <150)
VLDL: 19 mg/dL (ref 0–40)

## 2018-06-21 LAB — MAGNESIUM: Magnesium: 1.5 mg/dL — ABNORMAL LOW (ref 1.7–2.4)

## 2018-06-21 LAB — PROTIME-INR
INR: 1.11
Prothrombin Time: 14.2 seconds (ref 11.4–15.2)

## 2018-06-21 LAB — TROPONIN I: Troponin I: 4.65 ng/mL (ref <0.03)

## 2018-06-21 LAB — GLUCOSE, CAPILLARY
GLUCOSE-CAPILLARY: 200 mg/dL — AB (ref 70–99)
GLUCOSE-CAPILLARY: 277 mg/dL — AB (ref 70–99)
Glucose-Capillary: 217 mg/dL — ABNORMAL HIGH (ref 70–99)
Glucose-Capillary: 304 mg/dL — ABNORMAL HIGH (ref 70–99)

## 2018-06-21 LAB — HEPARIN LEVEL (UNFRACTIONATED): Heparin Unfractionated: 0.38 IU/mL (ref 0.30–0.70)

## 2018-06-21 MED ORDER — HEPARIN BOLUS VIA INFUSION
1500.0000 [IU] | Freq: Once | INTRAVENOUS | Status: AC
  Administered 2018-06-21: 1500 [IU] via INTRAVENOUS
  Filled 2018-06-21: qty 1500

## 2018-06-21 MED ORDER — METOPROLOL TARTRATE 25 MG PO TABS
25.0000 mg | ORAL_TABLET | Freq: Two times a day (BID) | ORAL | Status: DC
  Administered 2018-06-21 – 2018-06-22 (×2): 25 mg via ORAL
  Filled 2018-06-21 (×2): qty 1

## 2018-06-21 MED ORDER — HEPARIN (PORCINE) IN NACL 100-0.45 UNIT/ML-% IJ SOLN
1150.0000 [IU]/h | INTRAMUSCULAR | Status: DC
  Administered 2018-06-21: 1150 [IU]/h via INTRAVENOUS
  Filled 2018-06-21 (×3): qty 250

## 2018-06-21 MED ORDER — MUPIROCIN 2 % EX OINT
1.0000 "application " | TOPICAL_OINTMENT | Freq: Two times a day (BID) | CUTANEOUS | Status: DC
  Administered 2018-06-21 – 2018-06-22 (×3): 1 via NASAL
  Filled 2018-06-21 (×2): qty 22

## 2018-06-21 MED ORDER — POTASSIUM CHLORIDE CRYS ER 20 MEQ PO TBCR
40.0000 meq | EXTENDED_RELEASE_TABLET | Freq: Once | ORAL | Status: AC
  Administered 2018-06-21: 40 meq via ORAL
  Filled 2018-06-21: qty 2

## 2018-06-21 MED ORDER — CHLORHEXIDINE GLUCONATE CLOTH 2 % EX PADS
6.0000 | MEDICATED_PAD | Freq: Every day | CUTANEOUS | Status: DC
  Administered 2018-06-21 – 2018-06-22 (×2): 6 via TOPICAL
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
## 2018-06-21 NOTE — Progress Notes (Signed)
Nutrition Brief Note  Patient identified on the Malnutrition Screening Tool (MST) Report  Wt Readings from Last 15 Encounters:  06/21/18 189 lb 14.4 oz (86.1 kg)  06/17/18 199 lb 12.8 oz (90.6 kg)  06/10/18 197 lb 12.8 oz (89.7 kg)  06/10/18 197 lb 12.8 oz (89.7 kg)  05/16/18 191 lb 12.8 oz (87 kg)  05/10/18 196 lb 1.9 oz (89 kg)  05/10/18 190 lb (86.2 kg)  04/28/18 190 lb 6.4 oz (86.4 kg)  04/05/18 202 lb 1.9 oz (91.7 kg)  03/23/18 190 lb 9.6 oz (86.5 kg)  03/16/18 197 lb (89.4 kg)  02/23/18 197 lb 4.8 oz (89.5 kg)  02/14/18 202 lb (91.6 kg)  02/08/18 200 lb 6.4 oz (90.9 kg)  01/26/18 199 lb 1.9 oz (90.3 kg)   EOIN WILLDEN is a 84 y.o. male with medical history significant of CKD; DM; HTN; CAD s/p CABG; and CHF presenting with chest pain.   Pt admitted with chest pain and NSTEMI. He is a resident of [facility].   Spoke with pt at bedside, who reports fair appetite, estimating he consumed "some" of his lunch. Pt reports he consumes at least 50% of his meals usually, but breakfast is his best meal. He denies any weight loss. Reviewed wt hx; UBW around 190#. Pt without teeth, but denies offer of diet downgrade ("I've learned to eat without dentures").   Nutrition-Focused physical exam completed. Findings are no fat depletion, no muscle depletion, and mild edema.   Last Hgb A1c: 9.6 (06/17/18). PTA DM medications are 216 units insulin aspart TID, 25 units insulin glargine q HS, and 2 units insulin aspart if blood sugars >300 (per [facility] records). Per DM coordinator notes, if pt to remain inpatient, consider adding 0-5 units insulin aspart q HS and 3 units insulin aspart TID if post-prandials if CBGS >200.   Labs reviewed: K: 3.0, CBGS: 217 (inpatient orders for glycemic control are 0-9 units insulin aspart TID with meals and 20 units insulin glargine q HS).   Body mass index is 28.87 kg/m. Patient meets criteria for normal weight range based on current BMI.   Current diet  order is Heart Healthy/ Carb Modified, patient is consuming approximately 50% of meals at this time. Labs and medications reviewed.   No nutrition interventions warranted at this time. If nutrition issues arise, please consult RD.   Chi Woodham A. Jimmye Norman, RD, LDN, CDE Pager: [phone] After hours Pager: [phone]

## 2018-06-21 NOTE — Progress Notes (Signed)
Progress Note    GYAN CAMBRE  DVV:[id number] DOB: 01/29/1920  DOA: 06/20/2018 PCP: [name], MD    Brief Narrative:    Medical records reviewed and are as summarized below:  GURKIRAT BASHER is an 84 y.o. male with medical history significant of CKD; DM; HTN; CAD s/p CABG; and CHF presenting with chest pain.   "I just got to hurting in my chest".  The pain started Friday night "sort of".  He had difficulty sleeping that night.  The pain would come and go, but he couldn't tell what brought it on.  He did walk a lot on Friday going to the endocrinologist.  He did not c/o chest pain that day but he was out of breath.  It took longer than usual, but they aren't sure if he was more SOB.  Substernal CP.  He did c/o pain but the nursing home "didn't do nothing about it?Marland Kitchen  His symptoms may be better today.  He is feeling "nothing like I was" with the pain, but there may still be some discomfort (difficult history).  He had a CABG in 1999.  No stent since the CABG, and they are unsure if he has had a stress test.    Assessment/Plan:   Principal Problem:   NSTEMI (non-ST elevated myocardial infarction) ([place]) Active Problems:   Chronic combined systolic and diastolic CHF (congestive heart failure) ([place])   Essential hypertension   Dyslipidemia associated with type 2 diabetes mellitus ([place])   Diabetes mellitus with renal manifestations, uncontrolled ([place])   Persistent atrial fibrillation (H[facility])   CKD (chronic kidney disease), stage III ([place])  NSTEMI- with troponin peak of 4.65 -heparin gtt for 48 hours total which would end tomm -BB decreased -cardiology consult appreciated -imdur increased -holding lasix given increased Cr for today  Hypokalemia -check Mg -replete  Afib -Patient with prior h/o afib/flutter  HTN -Continue Norvasc, hydralazine, Lopressor  HLD -Change Crestor to daily -Recheck lipids - LDL was 117 last year and should be at goal <70  CHF -Appears to  be compensated at this time -8/18 Echo with EF 45-50% and grade 2 diastolic dysfunction -There does not appear to be significant benefit from repeating echo at this time  DM -Recent A1c shows poor control -He saw Dr. Loanne Drilling last week and he made changes for improving glycemic control  CKD -Appears to be worsening over time -While there may be an acute component today, this also could simply reflect progressive renal disease -Has outpatient f/u with Dr. Justin Mend that will need to be re-scheduled      Family Communication/Anticipated D/C date and plan/Code Status   DVT prophylaxis: heparin gtt Code Status: DNR Family Communication: at bedside Disposition Plan: SNF in AM   Medical Consultants:    cards    Subjective:   No chest pain- "starving"  Objective:    Vitals:   06/20/18 2043 06/21/18 0300 06/21/18 0530 06/21/18 0700  BP: (!) 142/61  (!) 131/57 (!) 135/54  Pulse: 97  65 72  Resp: 18  18 20   Temp: 98.5 F (36.9 C)  98 F (36.7 C) 98.7 F (37.1 C)  TempSrc: Oral     SpO2: 97%  96% 96%  Weight:  86.1 kg (189 lb 14.4 oz)    Height:        Intake/Output Summary (Last 24 hours) at 06/21/2018 1146 Last data filed at 06/21/2018 1119 Gross per 24 hour  Intake 267.25 ml  Output 1825 ml  Net -1557.75 ml   Filed Weights   06/20/18 0912 06/21/18 0300  Weight: 86.2 kg (190 lb) 86.1 kg (189 lb 14.4 oz)    Exam: In bed, NAD Pleasant cooperative No LE edema +BS, soft A+Ox3  Data Reviewed:   I have personally reviewed following labs and imaging studies:  Labs: Labs show the following:   Basic Metabolic Panel: Recent Labs  Lab 06/20/18 1011 06/21/18 0227  NA 139 136  K 3.1* 3.0*  CL 95* 91*  CO2 28 32  GLUCOSE 252* 262*  BUN 71* 68*  CREATININE 2.58* 2.72*  CALCIUM 9.6 9.0   GFR Estimated Creatinine Clearance: 17.3 mL/min (A) (by C-G formula based on SCr of 2.72 mg/dL (H)). Liver Function Tests: No results for input(s): AST, ALT, ALKPHOS,  BILITOT, PROT, ALBUMIN in the last 168 hours. No results for input(s): LIPASE, AMYLASE in the last 168 hours. No results for input(s): AMMONIA in the last 168 hours. Coagulation profile Recent Labs  Lab 06/21/18 0227  INR 1.11    CBC: Recent Labs  Lab 06/20/18 1011 06/21/18 0227  WBC 17.1* 12.9*  HGB 11.0* 10.2*  HCT 33.8* 31.5*  MCV 89.9 88.5  PLT 199 206   Cardiac Enzymes: Recent Labs  Lab 06/20/18 1354 06/20/18 1927 06/21/18 0227  TROPONINI 2.21* 3.53* 4.65*   BNP (last 3 results) Recent Labs    10/11/17 1520 04/05/18 1544  PROBNP 2,684* 3,175*   CBG: Recent Labs  Lab 06/20/18 1704 06/20/18 2124 06/21/18 0729  GLUCAP 276* 343* 217*   D-Dimer: No results for input(s): DDIMER in the last 72 hours. Hgb A1c: No results for input(s): HGBA1C in the last 72 hours. Lipid Profile: Recent Labs    06/21/18 0227  CHOL 119  HDL 38*  LDLCALC 62  TRIG 95  CHOLHDL 3.1   Thyroid function studies: Recent Labs    06/20/18 1354  TSH 1.434   Anemia work up: No results for input(s): VITAMINB12, FOLATE, FERRITIN, TIBC, IRON, RETICCTPCT in the last 72 hours. Sepsis Labs: Recent Labs  Lab 06/20/18 1011 06/21/18 0227  WBC 17.1* 12.9*    Microbiology Recent Results (from the past 240 hour(s))  MRSA PCR Screening     Status: Abnormal   Collection Time: 06/20/18 11:19 PM  Result Value Ref Range Status   MRSA by PCR POSITIVE (A) NEGATIVE Final    Comment:        The GeneXpert MRSA Assay (FDA approved for NASAL specimens only), is one component of a comprehensive MRSA colonization surveillance program. It is not intended to diagnose MRSA infection nor to guide or monitor treatment for MRSA infections. [place] [phone] 06/21/18 A BROWNING Performed at [facility], [place] [address] [place], [place] [address]     Procedures and diagnostic studies:  Dg Chest 2 View  Result Date: 06/20/2018 CLINICAL DATA:  Chest pain and shortness of breath EXAM:  CHEST - 2 VIEW COMPARISON:  January 13, 2018 FINDINGS: There is scarring in the left base region. There is no edema or consolidation. Heart is borderline enlarged with pulmonary vascularity normal. Patient is status post coronary artery bypass grafting. No adenopathy. No bone lesions. IMPRESSION: Scarring left base. No edema or consolidation. Stable cardiac prominence. No evident adenopathy. Postoperative changes noted. Electronically Signed   By: Lowella Grip III M.D.   On: 06/20/2018 10:52    Medications:   . amLODipine  5 mg Oral Daily  . aspirin EC  81 mg Oral Daily  .  Chlorhexidine Gluconate Cloth  6 each Topical Q0600  . hydrALAZINE  10 mg Oral BID  . insulin aspart  0-9 Units Subcutaneous TID WC  . insulin glargine  20 Units Subcutaneous QHS  . isosorbide mononitrate  60 mg Oral Daily  . latanoprost  1 drop Both Eyes QHS  . metoprolol tartrate  25 mg Oral BID  . mupirocin ointment  1 application Nasal BID  . pantoprazole  40 mg Oral Daily  . potassium chloride SA  20 mEq Oral BID  . potassium chloride  40 mEq Oral Once  . rosuvastatin  10 mg Oral Q M,W,F-1800  . senna-docusate  2 tablet Oral BID  . sodium chloride flush  3 mL Intravenous Q12H  . tamsulosin  0.4 mg Oral QHS   Continuous Infusions: . sodium chloride       LOS: 0 days   Geradine Girt  [facility]   *Please refer to [email or web address], password [email or web address] to get updated schedule on who will round on this patient, as hospitalists switch teams weekly. If 7PM-7AM, please contact night-coverage at [email or web address], password [email or web address] for any overnight needs.  06/21/2018, 11:46 AM

## 2018-06-21 NOTE — Progress Notes (Signed)
ANTICOAGULATION CONSULT NOTE  Pharmacy Consult for heparin Indication: chest pain/ACS  No Known Allergies  Patient Measurements: Height: 5\' 8"  (172.7 cm) Weight: 190 lb (86.2 kg) IBW/kg (Calculated) : 68.4 Heparin Dosing Weight: 86 kg  Vital Signs: Temp: 98.5 F (36.9 C) (08/05 2043) Temp Source: Oral (08/05 2043) BP: 142/61 (08/05 2043) Pulse Rate: 97 (08/05 2043)  Labs: Recent Labs    06/20/18 1011 06/20/18 1354 06/20/18 1927 06/20/18 2259  HGB 11.0*  --   --   --   HCT 33.8*  --   --   --   PLT 199  --   --   --   HEPARINUNFRC  --   --   --  0.22*  CREATININE 2.58*  --   --   --   TROPONINI  --  2.21* 3.53*  --     Estimated Creatinine Clearance: 18.3 mL/min (A) (by C-G formula based on SCr of 2.58 mg/dL (H)).  Assessment: 84 y.o. male with NSTEMI for heparin  Goal of Therapy:  Heparin level 0.3-0.7 units/ml Monitor platelets by anticoagulation protocol: Yes   Plan:  Heparin 1500 units IV bolus, then increase heparin 1150 units/hr Follow-up am labs.     Phillis Knack, PharmD, BCPS  06/21/2018 12:03 AM
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
## 2018-06-21 NOTE — Progress Notes (Signed)
Inpatient Diabetes Program Recommendations  AACE/ADA: New Consensus Statement on Inpatient Glycemic Control (2015)  Target Ranges:  Prepandial:   less than 140 mg/dL      Peak postprandial:   less than 180 mg/dL (1-2 hours)      Critically ill patients:  140 - 180 mg/dL   Lab Results  Component Value Date   GLUCAP 200 (H) 06/21/2018   HGBA1C 9.6 (A) 06/17/2018    Review of Glycemic Control Results for SALEEM, COCCIA (MRN [id number]) as of 06/21/2018 12:57  Ref. Range 06/20/2018 21:24 06/21/2018 07:29 06/21/2018 11:54  Glucose-Capillary Latest Ref Range: 70 - 99 mg/dL 343 (H) 217 (H) 200 (H)   Diabetes history: Type 2 DM Outpatient Diabetes medications: Novolog 20 units TID, Lantus 20 units QHS, Novolog 2 units SSI when BS >200 mg/dL Current orders for Inpatient glycemic control: Novolog 0-9 units TID, Lantus 20 units QHS  Inpatient Diabetes Program Recommendations:    If to remain inpatient, consider adding Novolog 0-5 units QHS. If post prandials continue to exceed 200 mg/dL, consider Novolog 3 units TID (assuming patient is consuming >50% of meal).  Thanks, Bronson Curb, MSN, RNC-OB Diabetes Coordinator [phone] (8a-5p)

## 2018-06-21 NOTE — Progress Notes (Signed)
ANTICOAGULATION CONSULT NOTE - Follow-Up Consult  Pharmacy Consult for heparin Indication: chest pain/ACS  No Known Allergies  Patient Measurements: Height: 5\' 8"  (172.7 cm) Weight: 189 lb 14.4 oz (86.1 kg) IBW/kg (Calculated) : 68.4 Heparin Dosing Weight: 86 kg  Vital Signs: Temp: 98 F (36.7 C) (08/06 0530) Temp Source: Oral (08/05 2043) BP: 131/57 (08/06 0530) Pulse Rate: 65 (08/06 0530)  Labs: Recent Labs    06/20/18 1011 06/20/18 1354 06/20/18 1927 06/20/18 2259 06/21/18 0227 06/21/18 0613  HGB 11.0*  --   --   --  10.2*  --   HCT 33.8*  --   --   --  31.5*  --   PLT 199  --   --   --  206  --   LABPROT  --   --   --   --  14.2  --   INR  --   --   --   --  1.11  --   HEPARINUNFRC  --   --   --  0.22*  --  0.38  CREATININE 2.58*  --   --   --  2.72*  --   TROPONINI  --  2.21* 3.53*  --  4.65*  --     Estimated Creatinine Clearance: 17.3 mL/min (A) (by C-G formula based on SCr of 2.72 mg/dL (H)).  Assessment: 89 yoM admitted with ACS r/o. Heparin level therapeutic at 0.38, CBC stable.  Goal of Therapy:  Heparin level 0.3-0.7 units/ml Monitor platelets by anticoagulation protocol: Yes   Plan:  Continue heparin 1150 units/hr Daily heparin level, CBC  Arrie Senate, PharmD, BCPS Clinical Pharmacist [phone] Please check AMION for all [place] numbers 06/21/2018

## 2018-06-21 NOTE — Clinical Social Work Note (Signed)
Clinical Social Work Assessment  Patient Details  Name: Cole [name] MRN: [id number] Date of Birth: 1920-06-11  Date of referral:  06/21/18               Reason for consult:  Discharge Planning                Permission sought to share information with:  Facility Sport and exercise psychologist, Family Supports Permission granted to share information::  Yes, Verbal Permission Granted  Name::     Dorena Dew  Agency::  [facility]  Relationship::  Daughter  Contact Information:  [phone]  Housing/Transportation Living arrangements for the past 2 months:  [place] of Information:  Patient, Medical Team, Facility, Adult Children Patient Interpreter Needed:  None Criminal Activity/Legal Involvement Pertinent to Current Situation/Hospitalization:  No - Comment as needed Significant Relationships:  Adult Children Lives with:  Facility Resident Do you feel safe going back to the place where you live?  Yes Need for family participation in patient care:  Yes (Comment)  Care giving concerns:  Patient is a long-[facility] resident at [facility].   Social Worker assessment / plan:  CSW met with patient. Daughter at bedside. CSW introduced role and explained that discharge planning would be discussed. Patient and his daughter confirmed he was admitted from [facility] and plan is for him to return at discharge. Per admissions coordinator, he is long-term. No further concerns. CSW encouraged patient and his daughter to contact CSW as needed. CSW will continue to follow patient and his daughter for support and facilitate discharge back to SNF once medically stable.  Employment status:  Retired Nurse, adult PT Recommendations:  Not assessed at this time Information / Referral to community resources:  [place]  Patient/Family's Response to care:  Patient and his daughter agreeable to return to SNF. Patient's children supportive  and involved in patient's care. Patient and his daughter appreciated social work intervention.  Patient/Family's Understanding of and Emotional Response to Diagnosis, Current Treatment, and Prognosis:  Patient and his daughter have a good understanding of the reason for admission and plan to return to SNF once medically stable. Patient and his daughter appear happy with hospital care. Patient eager to eat but per RN is NPO.  Emotional Assessment Appearance:  Appears stated age Attitude/Demeanor/Rapport:  Engaged, Gracious Affect (typically observed):  Accepting, Appropriate, Calm, Pleasant Orientation:  Oriented to Self, Oriented to Place, Oriented to  Time, Oriented to Situation Alcohol / Substance use:  Never Used Psych involvement (Current and /or in the community):  No (Comment)  Discharge Needs  Concerns to be addressed:  Care Coordination Readmission within the last 30 days:  No Current discharge risk:  None Barriers to Discharge:  Continued Medical Work up   Candie Chroman, LCSW 06/21/2018, 11:02 AM

## 2018-06-21 NOTE — Progress Notes (Signed)
Notified Dr. Gertie Fey of patient's Troponin trending up.Verbal order received to make patient NPO. No other complaints at this time. Will continue to monitor patient.

## 2018-06-22 DIAGNOSIS — E1169 Type 2 diabetes mellitus with other specified complication: Secondary | ICD-10-CM

## 2018-06-22 DIAGNOSIS — I10 Essential (primary) hypertension: Secondary | ICD-10-CM

## 2018-06-22 DIAGNOSIS — I441 Atrioventricular block, second degree: Secondary | ICD-10-CM

## 2018-06-22 DIAGNOSIS — E785 Hyperlipidemia, unspecified: Secondary | ICD-10-CM

## 2018-06-22 LAB — BASIC METABOLIC PANEL
Anion gap: 14 (ref 5–15)
BUN: 66 mg/dL — ABNORMAL HIGH (ref 8–23)
CHLORIDE: 90 mmol/L — AB (ref 98–111)
CO2: 32 mmol/L (ref 22–32)
Calcium: 9.1 mg/dL (ref 8.9–10.3)
Creatinine, Ser: 2.52 mg/dL — ABNORMAL HIGH (ref 0.61–1.24)
GFR calc non Af Amer: 20 mL/min — ABNORMAL LOW (ref >60)
GFR, EST AFRICAN AMERICAN: 23 mL/min — AB (ref >60)
Glucose, Bld: 189 mg/dL — ABNORMAL HIGH (ref 70–99)
Potassium: 3.1 mmol/L — ABNORMAL LOW (ref 3.5–5.1)
SODIUM: 136 mmol/L (ref 135–145)

## 2018-06-22 LAB — CBC
HEMATOCRIT: 32 % — AB (ref 39.0–52.0)
HEMOGLOBIN: 10.3 g/dL — AB (ref 13.0–17.0)
MCH: 28.6 pg (ref 26.0–34.0)
MCHC: 32.2 g/dL (ref 30.0–36.0)
MCV: 88.9 fL (ref 78.0–100.0)
Platelets: 199 10*3/uL (ref 150–400)
RBC: 3.6 MIL/uL — AB (ref 4.22–5.81)
RDW: 13.8 % (ref 11.5–15.5)
WBC: 9.3 10*3/uL (ref 4.0–10.5)

## 2018-06-22 LAB — HEPARIN LEVEL (UNFRACTIONATED): HEPARIN UNFRACTIONATED: 0.34 [IU]/mL (ref 0.30–0.70)

## 2018-06-22 LAB — GLUCOSE, CAPILLARY
GLUCOSE-CAPILLARY: 175 mg/dL — AB (ref 70–99)
GLUCOSE-CAPILLARY: 237 mg/dL — AB (ref 70–99)

## 2018-06-22 MED ORDER — FUROSEMIDE 80 MG PO TABS: 80.0000 mg | ORAL_TABLET | Freq: Every day | ORAL | Status: DC

## 2018-06-22 MED ORDER — ISOSORBIDE MONONITRATE ER 60 MG PO TB24: 60.0000 mg | ORAL_TABLET | Freq: Every day | ORAL | Status: DC

## 2018-06-22 MED ORDER — POTASSIUM CHLORIDE CRYS ER 20 MEQ PO TBCR
40.0000 meq | EXTENDED_RELEASE_TABLET | Freq: Once | ORAL | Status: AC
  Administered 2018-06-22: 40 meq via ORAL

## 2018-06-22 MED ORDER — POTASSIUM CHLORIDE CRYS ER 20 MEQ PO TBCR
40.0000 meq | EXTENDED_RELEASE_TABLET | Freq: Once | ORAL | Status: AC
Start: 2018-06-22 — End: 2018-06-22
  Administered 2018-06-22: 40 meq via ORAL
  Filled 2018-06-22: qty 2

## 2018-06-22 MED ORDER — METOPROLOL TARTRATE 25 MG PO TABS: 25.0000 mg | ORAL_TABLET | Freq: Two times a day (BID) | ORAL | Status: DC

## 2018-06-22 MED ORDER — MAGNESIUM SULFATE 2 GM/50ML IV SOLN
2.0000 g | Freq: Once | INTRAVENOUS | Status: AC
  Administered 2018-06-22: 2 g via INTRAVENOUS
  Filled 2018-06-22: qty 50

## 2018-06-22 NOTE — Progress Notes (Signed)
Paged social work regarding discharge orders placed

## 2018-06-22 NOTE — Progress Notes (Signed)
Patient will DC to: [facility] Anticipated DC date: 06/22/18 Family notified: Family at bedside Transport by: Family by car   Per MD patient ready for DC back to [place]. RN, patient, patient's family, and facility notified of DC. Discharge Summary sent to facility. RN given number for report [phone]).   CSW signing off.  Cedric Fishman, LCSW Clinical Social Worker [phone]

## 2018-06-22 NOTE — Progress Notes (Addendum)
ANTICOAGULATION CONSULT NOTE - Follow-Up Consult  Pharmacy Consult for heparin Indication: chest pain/ACS  No Known Allergies  Patient Measurements: Height: 5\' 8"  (172.7 cm) Weight: 187 lb 9.6 oz (85.1 kg)(scale c) IBW/kg (Calculated) : 68.4 Heparin Dosing Weight: 86 kg  Vital Signs: Temp: 98.6 F (37 C) (08/07 0541) Temp Source: Oral (08/07 0541) BP: 136/54 (08/07 0541) Pulse Rate: 60 (08/07 0541)  Labs: Recent Labs    06/20/18 1011 06/20/18 1354 06/20/18 1927 06/20/18 2259 06/21/18 0227 06/21/18 0613 06/22/18 0447  HGB 11.0*  --   --   --  10.2*  --  10.3*  HCT 33.8*  --   --   --  31.5*  --  32.0*  PLT 199  --   --   --  206  --  199  LABPROT  --   --   --   --  14.2  --   --   INR  --   --   --   --  1.11  --   --   HEPARINUNFRC  --   --   --  0.22*  --  0.38 0.34  CREATININE 2.58*  --   --   --  2.72*  --  2.52*  TROPONINI  --  2.21* 3.53*  --  4.65*  --   --     Estimated Creatinine Clearance: 18.6 mL/min (A) (by C-G formula based on SCr of 2.52 mg/dL (H)).  Assessment: 89 yoM admitted with ACS r/o. Heparin level therapeutic at 0.34, CBC stable. Planning for 48 hr of IV heparin, will stop this afternoon ~1430.  Goal of Therapy:  Heparin level 0.3-0.7 units/ml Monitor platelets by anticoagulation protocol: Yes   Plan:  Continue heparin 1150 units/hr until 1430 8/7 Pharmacy will sign off, reconsult as needed  Arrie Senate, PharmD, BCPS Clinical Pharmacist [phone] Please check AMION for all [place] numbers 06/22/2018
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
## 2018-06-22 NOTE — Progress Notes (Signed)
Patient ready for discharage back to [facility]. Spoke with admission coordinator. Daughter to transport to facility.

## 2018-06-22 NOTE — Discharge Summary (Signed)
Physician Discharge Summary  Cole [name] YBO:[id number] DOB: 06/13/1920 DOA: 06/20/2018  PCP: [name], MD  Admit date: 06/20/2018 Discharge date: 06/22/2018  Admitted From: SNF Discharge disposition: SNF   Recommendations for Outpatient Follow-Up:   1. BMP early next week re: K and Cr 2. Resume lasix on Friday 3. Daily weights 4. Monitor blood sugars closely and change regimen as needed   Discharge Diagnosis:   Principal Problem:   NSTEMI (non-ST elevated myocardial infarction) ([place]) Active Problems:   Chronic combined systolic and diastolic CHF (congestive heart failure) ([place])   Essential hypertension   Dyslipidemia associated with type 2 diabetes mellitus ([place])   Diabetes mellitus with renal manifestations, uncontrolled ([place])   Persistent atrial fibrillation (HCC)   CKD (chronic kidney disease), stage III (HCC)   Second degree AV block, Mobitz type I    Discharge Condition: Improved.  Diet recommendation: Low sodium, heart healthy.  Carbohydrate-modified.    Wound care: None.  Code status: Full.   History of Present Illness:  Cole [name] is a 84 y.o. male with medical history significant of CKD; DM; HTN; CAD s/p CABG; and CHF presenting with chest pain.   "I just got to hurting in my chest".  The pain started Friday night "sort of".  He had difficulty sleeping that night.  The pain would come and go, but he couldn't tell what brought it on.  He did walk a lot on Friday going to the endocrinologist.  He did not c/o chest pain that day but he was out of breath.  It took longer than usual, but they aren't sure if he was more SOB.  Substernal CP.  He did c/o pain but the nursing home "didn't do nothing about it?Marland Kitchen  His symptoms may be better today.  He is feeling "nothing like I was" with the pain, but there may still be some discomfort (difficult history).  He had a CABG in 1999.  No stent since the CABG, and they are unsure if he has had a stress  test.     Hospital Course by Problem:   1. Non-STEMI: -Symptoms started after exertional activity on Friday.Troponin peaked at 4.65. EKG long first-degree AV block(no third-degree block)nonspecific ST changes. -per cards:Not a candidate for cath given age, poor functional status and CKD. Treat conservatively by cardiology. Increasedimdur to 60mg  qd and decreased metoprolol -s/p heparin for 48 hours   2.Chronic combined CHF -Chest x-ray without evidence of edema - Does not appear volume overloaded on exam.  -resume lasix on Friday with close lab follow up Daily weights  3.Acute on chronic kidney disease IV  -Baseline creatinine around 1.9-2.1. Scr of 2.58>>2.72>>2.5today. Question whether this could be his new baseline?  -Followed by nephrology -- BMET on Monday  4. Hypokalemia -repleted -will need close outpatient follow up  5. Hypomagnesemia: -repleted IV      Medical Consultants:   cards   Discharge Exam:   Vitals:   06/22/18 0541 06/22/18 1138  BP: (!) 136/54 (!) 132/58  Pulse: 60 63  Resp: 18 16  Temp: 98.6 F (37 C) 98.7 F (37.1 C)  SpO2: 98% 97%   Vitals:   06/21/18 2100 06/21/18 2118 06/22/18 0541 06/22/18 1138  BP: (!) 131/59 (!) 131/59 (!) 136/54 (!) 132/58  Pulse: 64 64 60 63  Resp: 18 18 18 16   Temp: 98.8 F (37.1 C) 98.8 F (37.1 C) 98.6 F (37 C) 98.7 F (37.1 C)  TempSrc: Oral  Oral Oral Oral  SpO2:  97% 98% 97%  Weight:   85.1 kg (187 lb 9.6 oz)   Height:        General exam: Appears calm and comfortable.  Anxious to return home  The results of significant diagnostics from this hospitalization (including imaging, microbiology, ancillary and laboratory) are listed below for reference.     Procedures and Diagnostic Studies:   Dg Chest 2 View  Result Date: 06/20/2018 CLINICAL DATA:  Chest pain and shortness of breath EXAM: CHEST - 2 VIEW COMPARISON:  January 13, 2018 FINDINGS: There is scarring in the left base  region. There is no edema or consolidation. Heart is borderline enlarged with pulmonary vascularity normal. Patient is status post coronary artery bypass grafting. No adenopathy. No bone lesions. IMPRESSION: Scarring left base. No edema or consolidation. Stable cardiac prominence. No evident adenopathy. Postoperative changes noted. Electronically Signed   By: Lowella Grip III M.D.   On: 06/20/2018 10:52     Labs:   Basic Metabolic Panel: Recent Labs  Lab 06/20/18 1011 06/21/18 0227 06/21/18 1536 06/22/18 0447  NA 139 136  --  136  K 3.1* 3.0*  --  3.1*  CL 95* 91*  --  90*  CO2 28 32  --  32  GLUCOSE 252* 262*  --  189*  BUN 71* 68*  --  66*  CREATININE 2.58* 2.72*  --  2.52*  CALCIUM 9.6 9.0  --  9.1  MG  --   --  1.5*  --    GFR Estimated Creatinine Clearance: 18.6 mL/min (A) (by C-G formula based on SCr of 2.52 mg/dL (H)). Liver Function Tests: No results for input(s): AST, ALT, ALKPHOS, BILITOT, PROT, ALBUMIN in the last 168 hours. No results for input(s): LIPASE, AMYLASE in the last 168 hours. No results for input(s): AMMONIA in the last 168 hours. Coagulation profile Recent Labs  Lab 06/21/18 0227  INR 1.11    CBC: Recent Labs  Lab 06/20/18 1011 06/21/18 0227 06/22/18 0447  WBC 17.1* 12.9* 9.3  HGB 11.0* 10.2* 10.3*  HCT 33.8* 31.5* 32.0*  MCV 89.9 88.5 88.9  PLT 199 206 199   Cardiac Enzymes: Recent Labs  Lab 06/20/18 1354 06/20/18 1927 06/21/18 0227  TROPONINI 2.21* 3.53* 4.65*   BNP: Invalid input(s): POCBNP CBG: Recent Labs  Lab 06/21/18 1154 06/21/18 1637 06/21/18 2115 06/22/18 0728 06/22/18 1135  GLUCAP 200* 277* 304* 175* 237*   D-Dimer No results for input(s): DDIMER in the last 72 hours. Hgb A1c No results for input(s): HGBA1C in the last 72 hours. Lipid Profile Recent Labs    06/21/18 0227  CHOL 119  HDL 38*  LDLCALC 62  TRIG 95  CHOLHDL 3.1   Thyroid function studies Recent Labs    06/20/18 1354  TSH 1.434    Anemia work up No results for input(s): VITAMINB12, FOLATE, FERRITIN, TIBC, IRON, RETICCTPCT in the last 72 hours. Microbiology Recent Results (from the past 240 hour(s))  MRSA PCR Screening     Status: Abnormal   Collection Time: 06/20/18 11:19 PM  Result Value Ref Range Status   MRSA by PCR POSITIVE (A) NEGATIVE Final    Comment:        The GeneXpert MRSA Assay (FDA approved for NASAL specimens only), is one component of a comprehensive MRSA colonization surveillance program. It is not intended to diagnose MRSA infection nor to guide or monitor treatment for MRSA infections. E DODOO RN [id number] 06/21/18 A BROWNING Performed  at [facility], [place] [address], [place], [place] [address]      Discharge Instructions:   Discharge Instructions    Diet - low sodium heart healthy   Complete by:  As directed    Diet Carb Modified   Complete by:  As directed    Increase activity slowly   Complete by:  As directed      Allergies as of 06/22/2018   No Known Allergies     Medication List    STOP taking these medications   metolazone 2.5 MG tablet Commonly known as:  ZAROXOLYN     TAKE these medications   acetaminophen 325 MG tablet Commonly known as:  TYLENOL Take 650 mg by mouth every 6 (six) hours as needed for mild pain.   amLODipine 5 MG tablet Commonly known as:  NORVASC Take 5 mg by mouth daily.   aspirin 81 MG EC tablet Take 1 tablet (81 mg total) by mouth daily.   BASAGLAR KWIKPEN 100 UNIT/ML Sopn Inject 0.2 mLs (20 Units total) into the skin at bedtime.   bisacodyl 10 MG suppository Commonly known as:  DULCOLAX Place 10 mg rectally once as needed (FOR CONSTIPATION NOT RELIEVED BY MILK OF MAGNESIA).   cholecalciferol 1000 units tablet Commonly known as:  VITAMIN D Take 1,000 Units by mouth daily.   FLONASE ALLERGY RELIEF NA Place 2 sprays into the nose at bedtime. Each nostril for allergic rhinitis   furosemide 80 MG tablet Commonly known as:   LASIX Take 1 tablet (80 mg total) by mouth daily. Start taking on:  06/24/2018 What changed:  These instructions start on 06/24/2018. If you are unsure what to do until then, ask your doctor or other care provider.   hydrALAZINE 10 MG tablet Commonly known as:  APRESOLINE Take 10 mg by mouth 2 (two) times daily.   insulin aspart 100 UNIT/ML injection Commonly known as:  novoLOG Inject 2 Units into the skin See admin instructions. Take a extra 2 units for any cbgs > 300   insulin aspart 100 UNIT/ML FlexPen Commonly known as:  NOVOLOG FLEXPEN Inject 20 Units into the skin 3 (three) times daily with meals.   ipratropium-albuterol 0.5-2.5 (3) MG/3ML Soln Commonly known as:  DUONEB Take 3 mLs by nebulization every 6 (six) hours as needed.   isosorbide mononitrate 60 MG 24 hr tablet Commonly known as:  IMDUR Take 1 tablet (60 mg total) by mouth daily. Start taking on:  06/23/2018 What changed:    medication strength  how much to take   latanoprost 0.005 % ophthalmic solution Commonly known as:  XALATAN Place 1 drop into both eyes every evening.   magnesium hydroxide 400 MG/5ML suspension Commonly known as:  MILK OF MAGNESIA Take 5 mLs by mouth daily as needed for mild constipation.   metoprolol tartrate 25 MG tablet Commonly known as:  LOPRESSOR Take 1 tablet (25 mg total) by mouth 2 (two) times daily. What changed:    medication strength  See the new instructions.   multivitamin tablet Take 1 tablet by mouth daily.   nitroGLYCERIN 0.4 MG SL tablet Commonly known as:  NITROSTAT Place 0.4 mg under the tongue every 5 (five) minutes x 3 doses as needed for chest pain.   pantoprazole 40 MG tablet Commonly known as:  PROTONIX Take 40 mg by mouth daily.   potassium chloride SA 20 MEQ tablet Commonly known as:  K-DUR,KLOR-CON Take 1 tablet (20 mEq total) by mouth 2 (two) times  daily.   rosuvastatin 10 MG tablet Commonly known as:  CRESTOR Take 10 mg by mouth. M-W-F    sennosides-docusate sodium 8.6-50 MG tablet Commonly known as:  SENOKOT-S Take 2 tablets by mouth 2 (two) times daily.   tamsulosin 0.4 MG Caps capsule Commonly known as:  FLOMAX Take 0.4 mg by mouth at bedtime.   vitamin B-12 1000 MCG tablet Commonly known as:  CYANOCOBALAMIN Take 1,000 mcg by mouth daily.      Contact information for after-discharge care    Destination    [facility] .   Service:  Skilled Nursing Contact information: [address] [place] [phone]               Time coordinating discharge: 35 min  Signed:  Geradine Girt  [facility] 06/22/2018, 2:47 PM

## 2018-06-23 ENCOUNTER — Non-Acute Institutional Stay (SKILLED_NURSING_FACILITY): Payer: Medicare HMO | Admitting: Internal Medicine

## 2018-06-23 ENCOUNTER — Encounter: Payer: Self-pay | Admitting: Internal Medicine

## 2018-06-23 DIAGNOSIS — I214 Non-ST elevation (NSTEMI) myocardial infarction: Secondary | ICD-10-CM | POA: Diagnosis not present

## 2018-06-23 DIAGNOSIS — I5042 Chronic combined systolic (congestive) and diastolic (congestive) heart failure: Secondary | ICD-10-CM

## 2018-06-23 DIAGNOSIS — IMO0002 Reserved for concepts with insufficient information to code with codable children: Secondary | ICD-10-CM

## 2018-06-23 DIAGNOSIS — Z789 Other specified health status: Secondary | ICD-10-CM

## 2018-06-23 DIAGNOSIS — E1165 Type 2 diabetes mellitus with hyperglycemia: Secondary | ICD-10-CM | POA: Diagnosis not present

## 2018-06-23 DIAGNOSIS — E1129 Type 2 diabetes mellitus with other diabetic kidney complication: Secondary | ICD-10-CM | POA: Diagnosis not present

## 2018-06-23 NOTE — Assessment & Plan Note (Signed)
Resume Lasix 06/24/2018

## 2018-06-23 NOTE — Assessment & Plan Note (Signed)
Insulin as per Dr. Loanne Drilling [facility] 06/27/18

## 2018-06-23 NOTE — Patient Instructions (Addendum)
See assessment and plan under each diagnosis in the problem list and acutely for this visit Total time 41 minutes; greater than 50% of the visit spent counseling patient and daughter & coordinating care for problems addressed at this encounter

## 2018-06-23 NOTE — Assessment & Plan Note (Addendum)
Conservative treatment Patient Full Code;this will be reviewed with him

## 2018-06-23 NOTE — Assessment & Plan Note (Signed)
Continue full code status until final decision is made

## 2018-06-23 NOTE — Progress Notes (Signed)
NURSING HOME LOCATION:  [facility] ROOM NUMBER:  [id number]  CODE STATUS:  Full Code  PCP:  [name], MD  [place] [address]  This is a Early readmission within 30 days.  Interim medical record and care since last [place] visit was updated with review of diagnostic studies and change in clinical status since last visit were documented.  HPI: Patient was hospitalized 8/5-06/22/2018 with NSTEMI in the context of CABG in 1999.  This began after increased activity associated with an endocrinology visit.  Dr. Loanne Drilling had adjusted his insulin regimen @ that OV. Troponin peaked at 4.65.  EKG revealed first-degree AV block with nonspecific ST changes. Cardiology felt he was not a candidate for catheterization given his age, poor functional status, and CKD. Conservative treatment was recommended. Imdur was increased to 60 mg daily; metoprolol dose decreased.  Hypokalemia & hypomagnesemia were corrected Lasix was to be resumed 06/24/2018. The week of 8/12 BMP was recommended. Here at the SNF glucoses range from a low of 125 fasting up to 400 post lunch.  Medical history includes combined congestive heart failure, CKD,PSVT, hypertension, diabetes, diverticulosis and chronic osteomyelitis of left lower extremity.  Review of systems: He continues to have exertional dyspnea; he also has intermittent chest pain. He, his daughter, and I discussed resuscitation in detail.  According to his daughter he elects DNR when he is admitted to the hospital.  He stated when he got back here he " took off that (DNR) bracelet".  After long resuscitation discussion he did not commit as to his decision.  I requested that he and his daughter  discuss this and get back with me.  Constitutional: No fever, significant weight change, fatigue  Cardiovascular: No  palpitations, paroxysmal nocturnal dyspnea, claudication, edema  Respiratory: No cough, sputum production, hemoptysis,  apnea   Gastrointestinal: No heartburn, dysphagia, abdominal pain, nausea /vomiting, rectal bleeding, melena, change in bowels Genitourinary: No dysuria, hematuria, pyuria, incontinence, nocturia Neurologic: No dizziness, headache, syncope, seizures, numbness, tingling Psychiatric: No significant anxiety, depression, insomnia, anorexia Endocrine: No change in hair/skin/nails, excessive thirst, excessive hunger, excessive urination   Physical exam:  Pertinent or positive findings: Pattern alopecia is present.  He has dense arcus senilis.  Edentulous except for multiple eroded teeth of the anterior mandible.  Heart sounds are distant and irregular. Chest surprisingly clear.  Abdomen is protuberant.  Trace edema right lower extremity.  The left lower extremity is dressed.  He has a brace on the left foot.  Mixed DIP/PIP of the hands.  There is pitting and deformity of the fingernails of the left hand. Pedal pulses are decreased.  He has scarring over the forearms.  He has scattered bruising of the upper extremities.  General appearance: Adequately nourished; no acute distress, increased work of breathing is present.   Lymphatic: No lymphadenopathy about the head, neck, axilla. Eyes: No conjunctival inflammation or lid edema is present. There is no scleral icterus. Ears:  External ear exam shows no significant lesions or deformities.   Nose:  External nasal examination shows no deformity or inflammation. Nasal mucosa are pink and moist without lesions, exudates Oral exam:  Lips and gums are healthy appearing. There is no oropharyngeal erythema or exudate. Neck:  No thyromegaly, masses, tenderness noted.    Heart:  No gallop, murmur, click, rub .  Lungs: without wheezes, rhonchi, rales, rubs. Abdomen: Bowel sounds are normal. Abdomen is soft and nontender with no organomegaly, hernias, masses. GU:  Deferred  Extremities:  No cyanosis, clubbing Skin: Warm & dry w/o tenting. No significant   rash.  See summary under each active problem in the Problem List with associated updated therapeutic plan

## 2018-07-01 ENCOUNTER — Encounter: Payer: Self-pay | Admitting: Podiatry

## 2018-07-01 ENCOUNTER — Ambulatory Visit: Payer: Medicare HMO | Admitting: Podiatry

## 2018-07-01 DIAGNOSIS — M2041 Other hammer toe(s) (acquired), right foot: Secondary | ICD-10-CM | POA: Diagnosis not present

## 2018-07-03 NOTE — Progress Notes (Signed)
  Subjective:  Patient ID: Cole [name], male    DOB: Jan 04, 1920,  MRN: [id number]  Chief Complaint  Patient presents with  . Wound Check    Follow-up; Left foot; 2nd toe-top of toe; pt Diabetic Type 2; Sugar=118 this afternoon; A1C=9.6 (06/17/18)   84 y.o. male returns today for planned flexor tenotomy of the right 2nd digit.  Objective:  There were no vitals filed for this visit.  General AA&O x3. Normal mood and affect.  Vascular Pedal pulses palpable.  Neurologic Epicritic sensation grossly intact.  Dermatologic Pre-ulcerative callus at the tip of the right, 2nd toe  Orthopedic: Semi-reducible hammertoe deformity right, 2nd toe    Assessment & Plan:  Patient was evaluated and treated and all questions answered.  Hammertoe right 2nd toe with pre-ulcerative callus -Flexor tenotomy as below. -Advised to remove the dressing in 24 hours and apply a band-aid and triple abx ointment every day thereafter.  Procedure: Flexor Tenotomy Indication for Procedure: toe with semi-reducible hammertoe with distal tip ulceration. Flexor tenotomy indicated to alleviate contracture, reduce pressure, and enhance healing of the ulceration. Location: right, 2nd toe Anesthesia: Lidocaine 1% plain; 1.5 mL and Marcaine 0.5% plain; 1.5 mL digital block Instrumentation: 18 g needle  Technique: The toe was anesthetized as above and prepped in the usual fashion. The toe was exsanquinated and a tourniquet was secured at the base of the toe. An 18g needle was then used to percutaneously release the flexor tendon at the plantar surface of the toe with noted release of the hammertoe deformity. The incision was then dressed with antibiotic ointment and band-aid. Compression splint dressing applied. Patient tolerated the procedure well. Dressing: Dry, sterile, compression dressing. Disposition: Patient tolerated procedure well. Patient to return in 1 week for follow-up.   Return in about 1 week (around 07/08/2018)  for Post-op flexor tenotomy R 2nd toe.

## 2018-07-05 DIAGNOSIS — D649 Anemia, unspecified: Secondary | ICD-10-CM | POA: Diagnosis not present

## 2018-07-05 DIAGNOSIS — Z79899 Other long term (current) drug therapy: Secondary | ICD-10-CM | POA: Diagnosis not present

## 2018-07-05 LAB — BASIC METABOLIC PANEL
BUN: 55 — AB (ref 4–21)
Creatinine: 2.2 — AB (ref 0.6–1.3)
GLUCOSE: 109
Potassium: 3.8 (ref 3.4–5.3)
Sodium: 138 (ref 137–147)

## 2018-07-08 ENCOUNTER — Ambulatory Visit (INDEPENDENT_AMBULATORY_CARE_PROVIDER_SITE_OTHER): Payer: Medicare HMO | Admitting: Podiatry

## 2018-07-08 DIAGNOSIS — M2041 Other hammer toe(s) (acquired), right foot: Secondary | ICD-10-CM

## 2018-07-08 DIAGNOSIS — Z9889 Other specified postprocedural states: Secondary | ICD-10-CM
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
## 2018-07-12 DIAGNOSIS — E1122 Type 2 diabetes mellitus with diabetic chronic kidney disease: Secondary | ICD-10-CM | POA: Diagnosis not present

## 2018-07-12 DIAGNOSIS — D631 Anemia in chronic kidney disease: Secondary | ICD-10-CM | POA: Diagnosis not present

## 2018-07-12 DIAGNOSIS — N2581 Secondary hyperparathyroidism of renal origin: Secondary | ICD-10-CM | POA: Diagnosis not present

## 2018-07-12 DIAGNOSIS — I129 Hypertensive chronic kidney disease with stage 1 through stage 4 chronic kidney disease, or unspecified chronic kidney disease: Secondary | ICD-10-CM | POA: Diagnosis not present

## 2018-07-12 DIAGNOSIS — I2510 Atherosclerotic heart disease of native coronary artery without angina pectoris: Secondary | ICD-10-CM | POA: Diagnosis not present

## 2018-07-12 DIAGNOSIS — I214 Non-ST elevation (NSTEMI) myocardial infarction: Secondary | ICD-10-CM | POA: Diagnosis not present

## 2018-07-12 DIAGNOSIS — I5040 Unspecified combined systolic (congestive) and diastolic (congestive) heart failure: Secondary | ICD-10-CM | POA: Diagnosis not present

## 2018-07-12 DIAGNOSIS — E876 Hypokalemia: Secondary | ICD-10-CM | POA: Diagnosis not present

## 2018-07-12 DIAGNOSIS — N184 Chronic kidney disease, stage 4 (severe): Secondary | ICD-10-CM | POA: Diagnosis not present

## 2018-07-16 NOTE — Progress Notes (Signed)
  Subjective:  Patient ID: Cole [name], male    DOB: 1919-12-18,  MRN: [id number]  No chief complaint on file.  84 y.o. male returns for diabetic foot care. 3rd toe improved.  Objective:   General AA&O x3. Normal mood and affect.  Vascular Dorsalis pedis pulses present 1+ bilaterally  Posterior tibial pulses absent bilaterally  Capillary refill normal to all digits. Pedal hair growth normal.  Neurologic Epicritic sensation present bilaterally. Protective sensation with 5.07 monofilament  present bilaterally. Vibratory sensation present bilaterally.  Dermatologic No open lesions. Interspaces clear of maceration.  Normal skin temperature and turgor. Hyperkeratotic lesions: None bilaterally. Nails: brittle, onychomycosis, thickening, elongation R 2nd toe superficial ulcer over the proximal interphalangeal joint with purulent exudate but no probe to bone. Venous leg ulcer left leg Left second toe improved  Orthopedic: No history of amputation. MMT 5/5 in dorsiflexion, plantarflexion, inversion, and eversion. Normal lower extremity joint ROM without pain or crepitus.     Assessment & Plan:  Patient was evaluated and treated and all questions answered.  Left second toe ulcer -No open ulceration  R 2nd toe ulcer -Would benefit from flexor tenotomy procedure.  Will perform next visit.  Left leg VLU -Unna boot applied.  Return in about 2 weeks (around 06/30/2018) for R 2nd toe tenotomy procedure, L leg and toe wound f/u.  Needs x-ray

## 2018-07-22 ENCOUNTER — Telehealth: Payer: Self-pay

## 2018-07-22 ENCOUNTER — Ambulatory Visit (INDEPENDENT_AMBULATORY_CARE_PROVIDER_SITE_OTHER): Payer: Medicare HMO | Admitting: Podiatry

## 2018-07-22 DIAGNOSIS — I83892 Varicose veins of left lower extremities with other complications: Secondary | ICD-10-CM

## 2018-07-22 DIAGNOSIS — I83029 Varicose veins of left lower extremity with ulcer of unspecified site: Secondary | ICD-10-CM | POA: Diagnosis not present

## 2018-07-22 DIAGNOSIS — R600 Localized edema: Secondary | ICD-10-CM | POA: Diagnosis not present

## 2018-07-22 DIAGNOSIS — R609 Edema, unspecified: Secondary | ICD-10-CM | POA: Diagnosis not present

## 2018-07-22 DIAGNOSIS — L97929 Non-pressure chronic ulcer of unspecified part of left lower leg with unspecified severity: Secondary | ICD-10-CM | POA: Diagnosis not present

## 2018-07-22 NOTE — Telephone Encounter (Signed)
Referral for Palliative Care received. Phone call placed to patient to offer to schedule visit with Palliative NP. Visit scheduled for 07/25/18 with Colletta Maryland NP
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
## 2018-07-25 ENCOUNTER — Ambulatory Visit (INDEPENDENT_AMBULATORY_CARE_PROVIDER_SITE_OTHER): Payer: Medicare HMO | Admitting: Nurse Practitioner

## 2018-07-25 ENCOUNTER — Encounter: Payer: Self-pay | Admitting: Nurse Practitioner

## 2018-07-25 VITALS — BP 130/60 | HR 56 | Ht 70.0 in | Wt 206.4 lb

## 2018-07-25 DIAGNOSIS — I4819 Other persistent atrial fibrillation: Secondary | ICD-10-CM

## 2018-07-25 DIAGNOSIS — I5042 Chronic combined systolic (congestive) and diastolic (congestive) heart failure: Secondary | ICD-10-CM

## 2018-07-25 DIAGNOSIS — I2510 Atherosclerotic heart disease of native coronary artery without angina pectoris: Secondary | ICD-10-CM

## 2018-07-25 DIAGNOSIS — I481 Persistent atrial fibrillation: Secondary | ICD-10-CM | POA: Diagnosis not present

## 2018-07-25 DIAGNOSIS — I5032 Chronic diastolic (congestive) heart failure: Secondary | ICD-10-CM

## 2018-07-25 MED ORDER — METOLAZONE 2.5 MG PO TABS: 2.5000 mg | ORAL_TABLET | Freq: Every day | ORAL | 6 refills | Status: DC
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
## 2018-07-25 NOTE — Patient Instructions (Addendum)
We will be checking the following labs today - BMET  BMET in one week - fax to me please   Medication Instructions:    Continue with your current medicines. BUT  I am adding back the Zaroxolyn 2.5 mg to take Tuesday, Wednesday, and Thursday this week and then back to every Tuesday and Saturday starting next week.      Testing/Procedures To Be Arranged:  N/A  Follow-Up:   See me in about 2 to 3 weeks.     Other Special Instructions:   N/A    If you need a refill on your cardiac medications before your next appointment, please call your pharmacy.   Call the [place] office at [phone] if you have any questions, problems or concerns.

## 2018-07-26 ENCOUNTER — Telehealth: Payer: Self-pay

## 2018-07-26 ENCOUNTER — Telehealth: Payer: Self-pay | Admitting: *Deleted

## 2018-07-26 ENCOUNTER — Other Ambulatory Visit: Payer: Medicare HMO | Admitting: Nurse Practitioner

## 2018-07-26 LAB — BASIC METABOLIC PANEL
BUN/Creatinine Ratio: 21 (ref 10–24)
BUN: 46 mg/dL — ABNORMAL HIGH (ref 10–36)
CO2: 21 mmol/L (ref 20–29)
Calcium: 9.2 mg/dL (ref 8.6–10.2)
Chloride: 102 mmol/L (ref 96–106)
Creatinine, Ser: 2.23 mg/dL — ABNORMAL HIGH (ref 0.76–1.27)
GFR calc Af Amer: 28 mL/min/{1.73_m2} — ABNORMAL LOW (ref >59)
GFR calc non Af Amer: 24 mL/min/{1.73_m2} — ABNORMAL LOW (ref >59)
Glucose: 155 mg/dL — ABNORMAL HIGH (ref 65–99)
Potassium: 4 mmol/L (ref 3.5–5.2)
Sodium: 141 mmol/L (ref 134–144)

## 2018-07-26 NOTE — Telephone Encounter (Signed)
Phone call placed to patient to reschedule visit with Palliative Care. Rescheduled for 07/27/18

## 2018-07-26 NOTE — Telephone Encounter (Signed)
Faxing order to [facility] @ [phone] # is [phone] for extra dose of kdur (20 meq) to take for 3 days with extra zaroxolyn.

## 2018-07-27 ENCOUNTER — Telehealth: Payer: Self-pay | Admitting: Primary Care

## 2018-07-27 ENCOUNTER — Non-Acute Institutional Stay: Payer: Medicare HMO | Admitting: Primary Care

## 2018-07-27 ENCOUNTER — Other Ambulatory Visit: Payer: Medicare HMO | Admitting: Nurse Practitioner

## 2018-07-27 DIAGNOSIS — Z515 Encounter for palliative care: Secondary | ICD-10-CM

## 2018-07-27 DIAGNOSIS — R269 Unspecified abnormalities of gait and mobility: Secondary | ICD-10-CM | POA: Diagnosis not present

## 2018-07-27 DIAGNOSIS — R6889 Other general symptoms and signs: Secondary | ICD-10-CM | POA: Diagnosis not present

## 2018-07-27 DIAGNOSIS — R0602 Shortness of breath: Secondary | ICD-10-CM | POA: Diagnosis not present

## 2018-07-27 DIAGNOSIS — R609 Edema, unspecified: Secondary | ICD-10-CM | POA: Diagnosis not present
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
## 2018-07-27 NOTE — Telephone Encounter (Signed)
T/c to Cole [name] today to introduce service and discuss concerns. Ms. Dory Peru stated her father has been approached about advance care planning but seems anxious. She'd like to be at our next meeting but she is from [place]. She stated she'd be in [place] on 9/19 /19 and would like to meet with PC at that time.Will f/u with family meeting at that time.
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
## 2018-07-29 ENCOUNTER — Ambulatory Visit (INDEPENDENT_AMBULATORY_CARE_PROVIDER_SITE_OTHER): Payer: Medicare HMO | Admitting: Podiatry

## 2018-07-29 DIAGNOSIS — S80822A Blister (nonthermal), left lower leg, initial encounter: Secondary | ICD-10-CM | POA: Diagnosis not present

## 2018-07-29 DIAGNOSIS — L97929 Non-pressure chronic ulcer of unspecified part of left lower leg with unspecified severity: Secondary | ICD-10-CM

## 2018-07-29 DIAGNOSIS — I83892 Varicose veins of left lower extremities with other complications: Secondary | ICD-10-CM

## 2018-07-29 DIAGNOSIS — I83029 Varicose veins of left lower extremity with ulcer of unspecified site: Secondary | ICD-10-CM

## 2018-07-29 DIAGNOSIS — R609 Edema, unspecified: Secondary | ICD-10-CM | POA: Diagnosis not present
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
## 2018-08-01 DIAGNOSIS — I10 Essential (primary) hypertension: Secondary | ICD-10-CM | POA: Diagnosis not present

## 2018-08-01 DIAGNOSIS — D649 Anemia, unspecified: Secondary | ICD-10-CM | POA: Diagnosis not present

## 2018-08-01 LAB — BASIC METABOLIC PANEL
BUN: 54 — AB (ref 4–21)
Creatinine: 2.3 — AB (ref 0.6–1.3)
Glucose: 97
Potassium: 3.5 (ref 3.4–5.3)
Sodium: 141 (ref 137–147)

## 2018-08-03 DIAGNOSIS — H401212 Low-tension glaucoma, right eye, moderate stage: Secondary | ICD-10-CM | POA: Diagnosis not present

## 2018-08-04 ENCOUNTER — Encounter: Payer: Self-pay | Admitting: Internal Medicine

## 2018-08-04 ENCOUNTER — Non-Acute Institutional Stay: Payer: Medicare HMO | Admitting: Internal Medicine

## 2018-08-04 DIAGNOSIS — Z789 Other specified health status: Secondary | ICD-10-CM | POA: Diagnosis not present

## 2018-08-04 DIAGNOSIS — Z7189 Other specified counseling: Secondary | ICD-10-CM

## 2018-08-04 DIAGNOSIS — Z515 Encounter for palliative care: Secondary | ICD-10-CM | POA: Diagnosis not present
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
## 2018-08-08 ENCOUNTER — Non-Acute Institutional Stay (SKILLED_NURSING_FACILITY): Payer: Medicare HMO | Admitting: Adult Health

## 2018-08-08 ENCOUNTER — Encounter: Payer: Self-pay | Admitting: Adult Health

## 2018-08-08 DIAGNOSIS — E1122 Type 2 diabetes mellitus with diabetic chronic kidney disease: Secondary | ICD-10-CM

## 2018-08-08 DIAGNOSIS — Z794 Long term (current) use of insulin: Secondary | ICD-10-CM

## 2018-08-08 DIAGNOSIS — I5042 Chronic combined systolic (congestive) and diastolic (congestive) heart failure: Secondary | ICD-10-CM | POA: Diagnosis not present

## 2018-08-08 DIAGNOSIS — N184 Chronic kidney disease, stage 4 (severe): Secondary | ICD-10-CM | POA: Diagnosis not present

## 2018-08-08 DIAGNOSIS — L97909 Non-pressure chronic ulcer of unspecified part of unspecified lower leg with unspecified severity: Secondary | ICD-10-CM

## 2018-08-08 DIAGNOSIS — I83009 Varicose veins of unspecified lower extremity with ulcer of unspecified site: Secondary | ICD-10-CM

## 2018-08-08 DIAGNOSIS — I25709 Atherosclerosis of coronary artery bypass graft(s), unspecified, with unspecified angina pectoris: Secondary | ICD-10-CM

## 2018-08-08 DIAGNOSIS — N400 Enlarged prostate without lower urinary tract symptoms: Secondary | ICD-10-CM | POA: Diagnosis not present
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
## 2018-08-09 ENCOUNTER — Ambulatory Visit (INDEPENDENT_AMBULATORY_CARE_PROVIDER_SITE_OTHER): Payer: Medicare HMO | Admitting: Nurse Practitioner

## 2018-08-09 ENCOUNTER — Encounter: Payer: Self-pay | Admitting: Nurse Practitioner

## 2018-08-09 VITALS — BP 150/60 | HR 56

## 2018-08-09 DIAGNOSIS — N183 Chronic kidney disease, stage 3 unspecified: Secondary | ICD-10-CM

## 2018-08-09 DIAGNOSIS — I5042 Chronic combined systolic (congestive) and diastolic (congestive) heart failure: Secondary | ICD-10-CM | POA: Diagnosis not present

## 2018-08-09 DIAGNOSIS — I481 Persistent atrial fibrillation: Secondary | ICD-10-CM | POA: Diagnosis not present

## 2018-08-09 DIAGNOSIS — I4819 Other persistent atrial fibrillation: Secondary | ICD-10-CM
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
## 2018-08-09 NOTE — Patient Instructions (Addendum)
We will be checking the following labs today - BMET   Medication Instructions:    Continue with your current medicines.     Testing/Procedures To Be Arranged:  N/A  Follow-Up:   See me in about 3 to 4 months    Other Special Instructions:   N/A    If you need a refill on your cardiac medications before your next appointment, please call your pharmacy.   Call the [place] office at [phone] if you have any questions, problems or concerns.

## 2018-08-10 ENCOUNTER — Other Ambulatory Visit: Payer: Self-pay | Admitting: *Deleted

## 2018-08-10 DIAGNOSIS — N179 Acute kidney failure, unspecified: Secondary | ICD-10-CM

## 2018-08-10 DIAGNOSIS — I2510 Atherosclerotic heart disease of native coronary artery without angina pectoris: Secondary | ICD-10-CM

## 2018-08-10 DIAGNOSIS — I5042 Chronic combined systolic (congestive) and diastolic (congestive) heart failure: Secondary | ICD-10-CM

## 2018-08-10 DIAGNOSIS — I4819 Other persistent atrial fibrillation: Secondary | ICD-10-CM

## 2018-08-10 DIAGNOSIS — N189 Chronic kidney disease, unspecified: Secondary | ICD-10-CM

## 2018-08-10 LAB — BASIC METABOLIC PANEL
BUN/Creatinine Ratio: 23 (ref 10–24)
BUN: 52 mg/dL — ABNORMAL HIGH (ref 10–36)
CO2: 27 mmol/L (ref 20–29)
Calcium: 9.5 mg/dL (ref 8.6–10.2)
Chloride: 93 mmol/L — ABNORMAL LOW (ref 96–106)
Creatinine, Ser: 2.26 mg/dL — ABNORMAL HIGH (ref 0.76–1.27)
GFR calc Af Amer: 27 mL/min/{1.73_m2} — ABNORMAL LOW (ref >59)
GFR calc non Af Amer: 24 mL/min/{1.73_m2} — ABNORMAL LOW (ref >59)
Glucose: 137 mg/dL — ABNORMAL HIGH (ref 65–99)
Potassium: 3.4 mmol/L — ABNORMAL LOW (ref 3.5–5.2)
Sodium: 141 mmol/L (ref 134–144)

## 2018-08-10 MED ORDER — POTASSIUM CHLORIDE CRYS ER 20 MEQ PO TBCR: 20.0000 meq | EXTENDED_RELEASE_TABLET | Freq: Three times a day (TID) | ORAL | Status: DC

## 2018-08-11 ENCOUNTER — Non-Acute Institutional Stay (SKILLED_NURSING_FACILITY): Payer: Medicare HMO

## 2018-08-11 DIAGNOSIS — Z0000 Encounter for general adult medical examination without abnormal findings: Secondary | ICD-10-CM | POA: Diagnosis not present
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
## 2018-08-11 NOTE — Patient Instructions (Signed)
Cole [name] , Thank you for taking time to come for your Medicare Wellness Visit. I appreciate your ongoing commitment to your health goals. Please review the following plan we discussed and let me know if I can assist you in the future.   Screening recommendations/referrals: Colonoscopy excluded, over age 82 Recommended yearly ophthalmology/optometry visit for glaucoma screening and checkup Recommended yearly dental visit for hygiene and checkup  Vaccinations: Influenza vaccine due, will receive at [facility] Pneumococcal vaccine up to date, completed Tdap vaccine up to date, due 07/27/2025 Shingles vaccine not in past records    Advanced directives: In chart  Conditions/risks identified: none  Next appointment: Dr. Linna Darner makes rounds  Preventive Care 58 Years and Older, Male Preventive care refers to lifestyle choices and visits with your health care provider that can promote health and wellness. What does preventive care include?  A yearly physical exam. This is also called an annual well check.  Dental exams once or twice a year.  Routine eye exams. Ask your health care provider how often you should have your eyes checked.  Personal lifestyle choices, including:  Daily care of your teeth and gums.  Regular physical activity.  Eating a healthy diet.  Avoiding tobacco and drug use.  Limiting alcohol use.  Practicing safe sex.  Taking low doses of aspirin every day.  Taking vitamin and mineral supplements as recommended by your health care provider. What happens during an annual well check? The services and screenings done by your health care provider during your annual well check will depend on your age, overall health, lifestyle risk factors, and family history of disease. Counseling  Your health care provider may ask you questions about your:  Alcohol use.  Tobacco use.  Drug use.  Emotional well-being.  Home and relationship well-being.  Sexual  activity.  Eating habits.  History of falls.  Memory and ability to understand (cognition).  Work and work Statistician. Screening  You may have the following tests or measurements:  Height, weight, and BMI.  Blood pressure.  Lipid and cholesterol levels. These may be checked every 5 years, or more frequently if you are over 32 years old.  Skin check.  Lung cancer screening. You may have this screening every year starting at age 61 if you have a 30-pack-year history of smoking and currently smoke or have quit within the past 15 years.  Fecal occult blood test (FOBT) of the stool. You may have this test every year starting at age 67.  Flexible sigmoidoscopy or colonoscopy. You may have a sigmoidoscopy every 5 years or a colonoscopy every 10 years starting at age 74.  Prostate cancer screening. Recommendations will vary depending on your family history and other risks.  Hepatitis C blood test.  Hepatitis B blood test.  Sexually transmitted disease (STD) testing.  Diabetes screening. This is done by checking your blood sugar (glucose) after you have not eaten for a while (fasting). You may have this done every 1-3 years.  Abdominal aortic aneurysm (AAA) screening. You may need this if you are a current or former smoker.  Osteoporosis. You may be screened starting at age 26 if you are at high risk. Talk with your health care provider about your test results, treatment options, and if necessary, the need for more tests. Vaccines  Your health care provider may recommend certain vaccines, such as:  Influenza vaccine. This is recommended every year.  Tetanus, diphtheria, and acellular pertussis (Tdap, Td) vaccine. You may need a Td booster  every 10 years.  Zoster vaccine. You may need this after age 22.  Pneumococcal 13-valent conjugate (PCV13) vaccine. One dose is recommended after age 14.  Pneumococcal polysaccharide (PPSV23) vaccine. One dose is recommended after age  75. Talk to your health care provider about which screenings and vaccines you need and how often you need them. This information is not intended to replace advice given to you by your health care provider. Make sure you discuss any questions you have with your health care provider. Document Released: 11/29/2015 Document Revised: 07/22/2016 Document Reviewed: 09/03/2015 Elsevier Interactive Patient Education  2017 [place] Prevention in the Home Falls can cause injuries. They can happen to people of all ages. There are many things you can do to make your home safe and to help prevent falls. What can I do on the outside of my home?  Regularly fix the edges of walkways and driveways and fix any cracks.  Remove anything that might make you trip as you walk through a door, such as a raised step or threshold.  Trim any bushes or trees on the path to your home.  Use bright outdoor lighting.  Clear any walking paths of anything that might make someone trip, such as rocks or tools.  Regularly check to see if handrails are loose or broken. Make sure that both sides of any steps have handrails.  Any raised decks and porches should have guardrails on the edges.  Have any leaves, snow, or ice cleared regularly.  Use sand or salt on walking paths during winter.  Clean up any spills in your garage right away. This includes oil or grease spills. What can I do in the bathroom?  Use night lights.  Install grab bars by the toilet and in the tub and shower. Do not use towel bars as grab bars.  Use non-skid mats or decals in the tub or shower.  If you need to sit down in the shower, use a plastic, non-slip stool.  Keep the floor dry. Clean up any water that spills on the floor as soon as it happens.  Remove soap buildup in the tub or shower regularly.  Attach bath mats securely with double-sided non-slip rug tape.  Do not have throw rugs and other things on the floor that can make  you trip. What can I do in the bedroom?  Use night lights.  Make sure that you have a light by your bed that is easy to reach.  Do not use any sheets or blankets that are too big for your bed. They should not hang down onto the floor.  Have a firm chair that has side arms. You can use this for support while you get dressed.  Do not have throw rugs and other things on the floor that can make you trip. What can I do in the kitchen?  Clean up any spills right away.  Avoid walking on wet floors.  Keep items that you use a lot in easy-to-reach places.  If you need to reach something above you, use a strong step stool that has a grab bar.  Keep electrical cords out of the way.  Do not use floor polish or wax that makes floors slippery. If you must use wax, use non-skid floor wax.  Do not have throw rugs and other things on the floor that can make you trip. What can I do with my stairs?  Do not leave any items on the stairs.  Make  sure that there are handrails on both sides of the stairs and use them. Fix handrails that are broken or loose. Make sure that handrails are as long as the stairways.  Check any carpeting to make sure that it is firmly attached to the stairs. Fix any carpet that is loose or worn.  Avoid having throw rugs at the top or bottom of the stairs. If you do have throw rugs, attach them to the floor with carpet tape.  Make sure that you have a light switch at the top of the stairs and the bottom of the stairs. If you do not have them, ask someone to add them for you. What else can I do to help prevent falls?  Wear shoes that:  Do not have high heels.  Have rubber bottoms.  Are comfortable and fit you well.  Are closed at the toe. Do not wear sandals.  If you use a stepladder:  Make sure that it is fully opened. Do not climb a closed stepladder.  Make sure that both sides of the stepladder are locked into place.  Ask someone to hold it for you, if  possible.  Clearly mark and make sure that you can see:  Any grab bars or handrails.  First and last steps.  Where the edge of each step is.  Use tools that help you move around (mobility aids) if they are needed. These include:  Canes.  Walkers.  Scooters.  Crutches.  Turn on the lights when you go into a dark area. Replace any light bulbs as soon as they burn out.  Set up your furniture so you have a clear path. Avoid moving your furniture around.  If any of your floors are uneven, fix them.  If there are any pets around you, be aware of where they are.  Review your medicines with your doctor. Some medicines can make you feel dizzy. This can increase your chance of falling. Ask your doctor what other things that you can do to help prevent falls. This information is not intended to replace advice given to you by your health care provider. Make sure you discuss any questions you have with your health care provider. Document Released: 08/29/2009 Document Revised: 04/09/2016 Document Reviewed: 12/07/2014 Elsevier Interactive Patient Education  2017 [employer].

## 2018-08-12 ENCOUNTER — Ambulatory Visit: Payer: Medicare HMO

## 2018-08-12 DIAGNOSIS — S80822D Blister (nonthermal), left lower leg, subsequent encounter: Secondary | ICD-10-CM
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
## 2018-08-31 DIAGNOSIS — I129 Hypertensive chronic kidney disease with stage 1 through stage 4 chronic kidney disease, or unspecified chronic kidney disease: Secondary | ICD-10-CM | POA: Diagnosis not present

## 2018-08-31 DIAGNOSIS — N2581 Secondary hyperparathyroidism of renal origin: Secondary | ICD-10-CM | POA: Diagnosis not present

## 2018-08-31 DIAGNOSIS — N184 Chronic kidney disease, stage 4 (severe): Secondary | ICD-10-CM | POA: Diagnosis not present

## 2018-08-31 DIAGNOSIS — E1122 Type 2 diabetes mellitus with diabetic chronic kidney disease: Secondary | ICD-10-CM | POA: Diagnosis not present

## 2018-08-31 DIAGNOSIS — K219 Gastro-esophageal reflux disease without esophagitis: Secondary | ICD-10-CM | POA: Diagnosis not present

## 2018-08-31 DIAGNOSIS — I5040 Unspecified combined systolic (congestive) and diastolic (congestive) heart failure: Secondary | ICD-10-CM | POA: Diagnosis not present

## 2018-08-31 DIAGNOSIS — D631 Anemia in chronic kidney disease: Secondary | ICD-10-CM | POA: Diagnosis not present

## 2018-08-31 DIAGNOSIS — I2510 Atherosclerotic heart disease of native coronary artery without angina pectoris: Secondary | ICD-10-CM | POA: Diagnosis not present

## 2018-08-31 DIAGNOSIS — I471 Supraventricular tachycardia: Secondary | ICD-10-CM | POA: Diagnosis not present

## 2018-09-01 ENCOUNTER — Ambulatory Visit (INDEPENDENT_AMBULATORY_CARE_PROVIDER_SITE_OTHER): Payer: Self-pay | Admitting: Podiatry

## 2018-09-01 DIAGNOSIS — L97521 Non-pressure chronic ulcer of other part of left foot limited to breakdown of skin: Secondary | ICD-10-CM

## 2018-09-04 ENCOUNTER — Other Ambulatory Visit: Payer: Self-pay

## 2018-09-04 ENCOUNTER — Emergency Department (HOSPITAL_COMMUNITY)
Admission: EM | Admit: 2018-09-04 | Discharge: 2018-09-04 | Disposition: A | Discharge status: Skilled Nursing Facility | Payer: Medicare HMO | Attending: Emergency Medicine | Admitting: Emergency Medicine

## 2018-09-04 ENCOUNTER — Emergency Department (HOSPITAL_COMMUNITY): Payer: Medicare HMO

## 2018-09-04 DIAGNOSIS — R5381 Other malaise: Secondary | ICD-10-CM | POA: Diagnosis not present

## 2018-09-04 DIAGNOSIS — Z951 Presence of aortocoronary bypass graft: Secondary | ICD-10-CM | POA: Insufficient documentation

## 2018-09-04 DIAGNOSIS — R52 Pain, unspecified: Secondary | ICD-10-CM | POA: Diagnosis not present

## 2018-09-04 DIAGNOSIS — R7989 Other specified abnormal findings of blood chemistry: Secondary | ICD-10-CM

## 2018-09-04 DIAGNOSIS — I130 Hypertensive heart and chronic kidney disease with heart failure and stage 1 through stage 4 chronic kidney disease, or unspecified chronic kidney disease: Secondary | ICD-10-CM | POA: Diagnosis not present

## 2018-09-04 DIAGNOSIS — Z79899 Other long term (current) drug therapy: Secondary | ICD-10-CM | POA: Insufficient documentation

## 2018-09-04 DIAGNOSIS — I5042 Chronic combined systolic (congestive) and diastolic (congestive) heart failure: Secondary | ICD-10-CM | POA: Diagnosis not present

## 2018-09-04 DIAGNOSIS — Z794 Long term (current) use of insulin: Secondary | ICD-10-CM | POA: Diagnosis not present

## 2018-09-04 DIAGNOSIS — N3000 Acute cystitis without hematuria: Secondary | ICD-10-CM | POA: Insufficient documentation

## 2018-09-04 DIAGNOSIS — R279 Unspecified lack of coordination: Secondary | ICD-10-CM | POA: Diagnosis not present

## 2018-09-04 DIAGNOSIS — R0602 Shortness of breath: Secondary | ICD-10-CM | POA: Insufficient documentation

## 2018-09-04 DIAGNOSIS — I2510 Atherosclerotic heart disease of native coronary artery without angina pectoris: Secondary | ICD-10-CM | POA: Diagnosis not present

## 2018-09-04 DIAGNOSIS — Z87891 Personal history of nicotine dependence: Secondary | ICD-10-CM | POA: Insufficient documentation

## 2018-09-04 DIAGNOSIS — I959 Hypotension, unspecified: Secondary | ICD-10-CM | POA: Diagnosis not present

## 2018-09-04 DIAGNOSIS — E876 Hypokalemia: Secondary | ICD-10-CM | POA: Diagnosis not present

## 2018-09-04 DIAGNOSIS — M546 Pain in thoracic spine: Secondary | ICD-10-CM | POA: Diagnosis not present

## 2018-09-04 DIAGNOSIS — M5489 Other dorsalgia: Secondary | ICD-10-CM | POA: Diagnosis not present

## 2018-09-04 DIAGNOSIS — N183 Chronic kidney disease, stage 3 (moderate): Secondary | ICD-10-CM | POA: Insufficient documentation

## 2018-09-04 DIAGNOSIS — E1122 Type 2 diabetes mellitus with diabetic chronic kidney disease: Secondary | ICD-10-CM | POA: Diagnosis not present

## 2018-09-04 DIAGNOSIS — Z743 Need for continuous supervision: Secondary | ICD-10-CM | POA: Diagnosis not present

## 2018-09-04 DIAGNOSIS — R778 Other specified abnormalities of plasma proteins: Secondary | ICD-10-CM

## 2018-09-04 DIAGNOSIS — R0902 Hypoxemia: Secondary | ICD-10-CM | POA: Diagnosis not present

## 2018-09-04 LAB — COMPREHENSIVE METABOLIC PANEL
ALT: 14 U/L (ref 0–44)
AST: 19 U/L (ref 15–41)
Albumin: 3.9 g/dL (ref 3.5–5.0)
Alkaline Phosphatase: 40 U/L (ref 38–126)
Anion gap: 13 (ref 5–15)
BUN: 64 mg/dL — AB (ref 8–23)
CHLORIDE: 99 mmol/L (ref 98–111)
CO2: 28 mmol/L (ref 22–32)
CREATININE: 2.36 mg/dL — AB (ref 0.61–1.24)
Calcium: 9.3 mg/dL (ref 8.9–10.3)
GFR calc non Af Amer: 22 mL/min — ABNORMAL LOW (ref >60)
GFR, EST AFRICAN AMERICAN: 25 mL/min — AB (ref >60)
Glucose, Bld: 93 mg/dL (ref 70–99)
POTASSIUM: 3 mmol/L — AB (ref 3.5–5.1)
Sodium: 140 mmol/L (ref 135–145)
TOTAL PROTEIN: 7.8 g/dL (ref 6.5–8.1)
Total Bilirubin: 0.8 mg/dL (ref 0.3–1.2)

## 2018-09-04 LAB — CBC WITH DIFFERENTIAL/PLATELET
Abs Immature Granulocytes: 0.04 10*3/uL (ref 0.00–0.07)
BASOS PCT: 1 %
Basophils Absolute: 0 10*3/uL (ref 0.0–0.1)
Eosinophils Absolute: 0.2 10*3/uL (ref 0.0–0.5)
Eosinophils Relative: 2 %
HEMATOCRIT: 35.1 % — AB (ref 39.0–52.0)
Hemoglobin: 11.1 g/dL — ABNORMAL LOW (ref 13.0–17.0)
Immature Granulocytes: 1 %
LYMPHS ABS: 1.3 10*3/uL (ref 0.7–4.0)
Lymphocytes Relative: 16 %
MCH: 27.5 pg (ref 26.0–34.0)
MCHC: 31.6 g/dL (ref 30.0–36.0)
MCV: 87.1 fL (ref 80.0–100.0)
MONOS PCT: 11 %
Monocytes Absolute: 0.8 10*3/uL (ref 0.1–1.0)
NEUTROS PCT: 69 %
Neutro Abs: 5.6 10*3/uL (ref 1.7–7.7)
PLATELETS: 245 10*3/uL (ref 150–400)
RBC: 4.03 MIL/uL — ABNORMAL LOW (ref 4.22–5.81)
RDW: 13.7 % (ref 11.5–15.5)
WBC: 8 10*3/uL (ref 4.0–10.5)
nRBC: 0 % (ref 0.0–0.2)

## 2018-09-04 LAB — URINALYSIS, ROUTINE W REFLEX MICROSCOPIC
BILIRUBIN URINE: NEGATIVE
Glucose, UA: NEGATIVE mg/dL
HGB URINE DIPSTICK: NEGATIVE
Ketones, ur: NEGATIVE mg/dL
Nitrite: NEGATIVE
Protein, ur: NEGATIVE mg/dL
SPECIFIC GRAVITY, URINE: 1.006 (ref 1.005–1.030)
pH: 5 (ref 5.0–8.0)

## 2018-09-04 LAB — TROPONIN I
TROPONIN I: 0.04 ng/mL — AB (ref <0.03)
Troponin I: 0.03 ng/mL (ref <0.03)

## 2018-09-04 LAB — CBG MONITORING, ED: Glucose-Capillary: 109 mg/dL — ABNORMAL HIGH (ref 70–99)

## 2018-09-04 MED ORDER — LIDOCAINE 5 % EX PTCH
1.0000 | MEDICATED_PATCH | CUTANEOUS | Status: DC
Start: 2018-09-04 — End: 2018-09-04
  Administered 2018-09-04: 1 via TRANSDERMAL
  Filled 2018-09-04: qty 1

## 2018-09-04 MED ORDER — INSULIN ASPART 100 UNIT/ML ~~LOC~~ SOLN
20.0000 [IU] | Freq: Once | SUBCUTANEOUS | Status: DC
  Administered 2018-09-04: 10 [IU] via SUBCUTANEOUS
  Filled 2018-09-04: qty 1

## 2018-09-04 MED ORDER — CEPHALEXIN 250 MG PO CAPS: 250.0000 mg | ORAL_CAPSULE | Freq: Three times a day (TID) | ORAL | 0 refills | Status: DC

## 2018-09-04 MED ORDER — DICLOFENAC SODIUM 1 % TD GEL: 2.0000 g | Freq: Four times a day (QID) | TRANSDERMAL | 0 refills | Status: DC

## 2018-09-04 MED ORDER — POTASSIUM CHLORIDE 20 MEQ PO PACK
20.0000 meq | PACK | Freq: Once | ORAL | Status: AC
  Administered 2018-09-04: 20 meq via ORAL
  Filled 2018-09-04: qty 1

## 2018-09-04 MED ORDER — CEPHALEXIN 250 MG PO CAPS
250.0000 mg | ORAL_CAPSULE | Freq: Once | ORAL | Status: AC
Start: 2018-09-04 — End: 2018-09-04
  Administered 2018-09-04: 250 mg via ORAL
  Filled 2018-09-04: qty 1

## 2018-09-04 MED ORDER — ACETAMINOPHEN 325 MG PO TABS
650.0000 mg | ORAL_TABLET | Freq: Once | ORAL | Status: AC
  Administered 2018-09-04: 650 mg via ORAL
  Filled 2018-09-04: qty 2

## 2018-09-04 MED ORDER — INSULIN ASPART 100 UNIT/ML ~~LOC~~ SOLN: 10.0000 [IU] | Freq: Once | SUBCUTANEOUS | Status: DC

## 2018-09-04 MED ORDER — CEPHALEXIN 250 MG PO CAPS: 250.0000 mg | ORAL_CAPSULE | Freq: Three times a day (TID) | ORAL | 0 refills | Status: AC
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
## 2018-09-04 NOTE — ED Triage Notes (Signed)
Patient arrived with EMS from SNF c/o left flank pain started last night. Per EMS pain was unable to control with tylenol that was been given at 7pm. Denies N/V.

## 2018-09-04 NOTE — Discharge Instructions (Addendum)
Take antibiotics as prescribed.  Take the entire course, even if your symptoms improve. Use Tylenol as needed for pain.  Try Voltaren gel to help with pain as well. It is important to follow-up with Dr. Linna Darner for further evaluation of your symptoms. Return to the emergency room if you develop any new, worsening, or concerning symptoms.  We did feed him breakfast, and he was given a half dose of his home insulin, 10 units, because his glucose was low.  Please check as soon as he arrives at the nursing care facility.

## 2018-09-04 NOTE — ED Notes (Signed)
Date and time results received: 09/04/18 1032 (use smartphrase ".now" to insert current time)  Test: Troponin Critical Value: 0.03  Name of Provider Notified: Alyssa PA   Orders Received? Or Actions Taken?: awaiting orders

## 2018-09-04 NOTE — ED Notes (Signed)
Bed: OI32 Expected date:  Expected time:  Means of arrival:  Comments: 84yo M/SNF
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
## 2018-09-06 LAB — URINE CULTURE

## 2018-09-07 ENCOUNTER — Telehealth: Payer: Self-pay | Admitting: Emergency Medicine

## 2018-09-07 NOTE — Telephone Encounter (Signed)
Post ED Visit - Positive Culture Follow-up  Culture report reviewed by antimicrobial stewardship pharmacist:  []  Elenor Quinones, Pharm.D. []  Heide Guile, Pharm.D., BCPS AQ-ID []  Parks Neptune, Pharm.D., BCPS []  Alycia Rossetti, Pharm.D., BCPS []  [place], [place].D., BCPS, AAHIVP []  Legrand Como, Pharm.D., BCPS, AAHIVP [x]  Salome Arnt, PharmD, BCPS []  Johnnette Gourd, PharmD, BCPS []  Hughes Better, PharmD, BCPS []  Leeroy Cha, PharmD  Positive urine culture Treated with cephalexin, organism sensitive to the same and no further patient follow-up is required at this time.  Hazle Nordmann 09/07/2018, 9:53 AM

## 2018-09-13 ENCOUNTER — Encounter: Payer: Self-pay | Admitting: Internal Medicine

## 2018-09-13 ENCOUNTER — Non-Acute Institutional Stay (SKILLED_NURSING_FACILITY): Payer: Medicare HMO | Admitting: Internal Medicine

## 2018-09-13 DIAGNOSIS — R0789 Other chest pain: Secondary | ICD-10-CM | POA: Diagnosis not present

## 2018-09-13 DIAGNOSIS — Z7189 Other specified counseling: Secondary | ICD-10-CM | POA: Diagnosis not present

## 2018-09-13 DIAGNOSIS — K5900 Constipation, unspecified: Secondary | ICD-10-CM | POA: Diagnosis not present

## 2018-09-13 NOTE — Assessment & Plan Note (Addendum)
Left-sided chest pain is described as intermittent and is atypical.  D-dimer and ventilation/perfusion scan could be considered if recurrent & progressive

## 2018-09-13 NOTE — Progress Notes (Signed)
NURSING HOME LOCATION:  [facility] ROOM NUMBER:  [id number]  CODE STATUS:  Full Code  PCP:  [name], MD  [address] [place] [address]  This is a nursing facility follow up of chronic medical diagnoses.  Interim medical record and care since last [place] visit was updated with review of diagnostic studies and change in clinical status since last visit were documented.   HPI: Patient was seen in the ED 09/04/2018 for left-sided thoracic chest pain.  He understood that the symptoms were related to his left lung.  Chest x-rays reveal vascular congestion and cardiomegaly but no acute process. Screening troponin had been elevated with a value of 0.04, delta troponin was negative. No d-dimer was done.   Urinalysis was cloudy with large leukocytes and greater than 50 white blood cells and few bacteria. Clinically pyelonephritis was diagnosed  for which Rocephin was recommended.  More aggressive therapy was not pursued as he did not have costophrenic angle tenderness to palpation/percussion, leukocytosis, or fever.  Subsequently urine culture grew greater than 100,000 E. coli uniformly sensitive to all antibiotics. He had been seen by cardiology 08/09/2018 for PAF, chronic diastolic heart failure, and coronary disease status post bypass.  Zaroxolyn was continued to control his weight gain with the chronic heart failure. His CKD is essentially stable.  On 10/20 in the ED creatinine was 2.36, BUN 64, and GFR 22.  He had a normochromic normocytic anemia with hemoglobin 11.1 and hematocrit of 35.1. Here at the SNF leukosis have ranged from a low of 110 fasting up to a value of 330 before the evening meal. BP & pulse have been variable w/o sustained HTN or symptomatic bradycardia  In AHT he is listed as a Full Code but, his CODE STATUS was changed after a frank discussion among his daughter, him and me. This  indeed was verified today.    Review of systems: He describes  intermittent,dull left lateral chest which he describes as a "feeling".  It is not radicular in nature and he denies any pleuritic component.  He has no active pulmonary symptoms.  He has intermittent constipation for which he takes a laxative with good results.  TSH is current and is therapeutic.  He has absolutely no genitourinary symptoms. Constitutional: No fever, significant weight change, fatigue  Eyes: No redness, discharge, pain, vision change ENT/mouth: No nasal congestion,  purulent discharge, earache, change in hearing, sore throat  Cardiovascular: No  palpitations, paroxysmal nocturnal dyspnea, claudication, edema  Respiratory: No cough, sputum production, hemoptysis,  significant snoring, apnea   Gastrointestinal: No heartburn, dysphagia, abdominal pain, nausea /vomiting, rectal bleeding, melena Genitourinary: No dysuria, hematuria, pyuria, incontinence, nocturia Musculoskeletal: No joint stiffness, joint swelling, weakness, pain Dermatologic: No rash, pruritus, change in appearance of skin Neurologic: No dizziness, headache, syncope, seizures, numbness, tingling Psychiatric: No significant anxiety, depression, insomnia, anorexia Endocrine: No change in hair/skin/nails, excessive thirst, excessive hunger, excessive urination  Hematologic/lymphatic: No significant bruising, lymphadenopathy, abnormal bleeding Allergy/immunology: No itchy/watery eyes, significant sneezing, urticaria, angioedema  Physical exam:  Pertinent or positive findings: He actually appears younger than his stated age.  He has pattern alopecia, hair is disheveled.  He has dense arcus senilis.  The maxilla is edentulous.  He has a few carious lower teeth.  He has minor bibasilar rales, greater on the right than the left.  These essentially clear with deep breathing.  Heart rhythm is slow and irregular.  Abdomen is protuberant.  Pedal pulses are decreased.  He is wearing TED type hose.  The left second toe is  bandaged.  General appearance: Adequately nourished; no acute distress, increased work of breathing is present.   Lymphatic: No lymphadenopathy about the head, neck, axilla. Eyes: No conjunctival inflammation or lid edema is present. There is no scleral icterus. Ears:  External ear exam shows no significant lesions or deformities.   Nose:  External nasal examination shows no deformity or inflammation. Nasal mucosa are pink and moist without lesions, exudates Oral exam:  Lips and gums are healthy appearing. There is no oropharyngeal erythema or exudate. Neck:  No thyromegaly, masses, tenderness noted.    Heart:  No gallop, murmur, click, rub .  Lungs:  without wheezes, rhonchi,  rubs. Abdomen: Bowel sounds are normal. Abdomen is soft and nontender with no organomegaly, hernias, masses. GU: Deferred  Extremities:  No cyanosis, clubbing  Skin: Warm & dry w/o tenting. No significant  rash.  See summary under each active problem in the Problem List with associated updated therapeutic plan

## 2018-09-13 NOTE — Assessment & Plan Note (Signed)
09/13/2018 again DNR status was verified with the patient

## 2018-09-13 NOTE — Patient Instructions (Signed)
See assessment and plan under each diagnosis in the problem list and acutely for this visit 

## 2018-09-14 ENCOUNTER — Encounter: Payer: Self-pay | Admitting: Internal Medicine
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
## 2018-09-18 DIAGNOSIS — R319 Hematuria, unspecified: Secondary | ICD-10-CM | POA: Diagnosis not present

## 2018-09-18 DIAGNOSIS — D649 Anemia, unspecified: Secondary | ICD-10-CM | POA: Diagnosis not present

## 2018-09-18 DIAGNOSIS — Z79899 Other long term (current) drug therapy: Secondary | ICD-10-CM | POA: Diagnosis not present

## 2018-09-18 DIAGNOSIS — N390 Urinary tract infection, site not specified: Secondary | ICD-10-CM | POA: Diagnosis not present

## 2018-09-18 LAB — CBC AND DIFFERENTIAL
HEMATOCRIT: 31 — AB (ref 41–53)
HEMOGLOBIN: 10.7 — AB (ref 13.5–17.5)
NEUTROS ABS: 8
PLATELETS: 132 — AB (ref 150–399)
WBC: 9.9

## 2018-09-19 ENCOUNTER — Ambulatory Visit (INDEPENDENT_AMBULATORY_CARE_PROVIDER_SITE_OTHER): Payer: Medicare HMO | Admitting: Endocrinology

## 2018-09-19 ENCOUNTER — Encounter: Payer: Self-pay | Admitting: Endocrinology

## 2018-09-19 VITALS — BP 110/60 | HR 56 | Ht 68.0 in | Wt 200.6 lb

## 2018-09-19 DIAGNOSIS — E1151 Type 2 diabetes mellitus with diabetic peripheral angiopathy without gangrene: Secondary | ICD-10-CM | POA: Diagnosis not present

## 2018-09-19 DIAGNOSIS — I70209 Unspecified atherosclerosis of native arteries of extremities, unspecified extremity: Secondary | ICD-10-CM

## 2018-09-19 LAB — POCT GLYCOSYLATED HEMOGLOBIN (HGB A1C): Hemoglobin A1C: 7.2 % — AB (ref 4.0–5.6)

## 2018-09-19 MED ORDER — BASAGLAR KWIKPEN 100 UNIT/ML ~~LOC~~ SOPN: 6.0000 [IU] | PEN_INJECTOR | Freq: Every day | SUBCUTANEOUS | 11 refills | Status: DC

## 2018-09-19 MED ORDER — INSULIN ASPART 100 UNIT/ML FLEXPEN: 21.0000 [IU] | PEN_INJECTOR | Freq: Three times a day (TID) | SUBCUTANEOUS | 11 refills | Status: DC

## 2018-09-19 NOTE — Patient Instructions (Addendum)
check resident's blood sugar 4 times a day: before the 3 meals, and at bedtime.  also check if you have symptoms of your blood sugar being too high or too low.  please keep a record of the readings and bring it to your next appointment here (or you can bring the meter itself).  please call us sooner if your blood sugar goes below 70, or if most cbg's are over 200.  Please send cbg record with resident to appointment here.   Take novolog, 21 units 3 times a day (with each meal), and: Reduce basaglar to 6 units at bedtime.  Also, take extra novolog, 2 units for any cbg over 300.  Please come back for a follow-up appointment in 3 months.
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
## 2018-09-20 DIAGNOSIS — Z79899 Other long term (current) drug therapy: Secondary | ICD-10-CM | POA: Diagnosis not present

## 2018-09-20 LAB — BASIC METABOLIC PANEL
BUN: 58 — AB (ref 4–21)
Creatinine: 2.4 — AB (ref 0.6–1.3)
Glucose: 109
Potassium: 2.9 — AB (ref 3.4–5.3)
SODIUM: 140 (ref 137–147)

## 2018-09-21 ENCOUNTER — Encounter: Payer: Self-pay | Admitting: Adult Health

## 2018-09-21 ENCOUNTER — Non-Acute Institutional Stay (SKILLED_NURSING_FACILITY): Payer: Medicare HMO | Admitting: Adult Health

## 2018-09-21 DIAGNOSIS — L853 Xerosis cutis: Secondary | ICD-10-CM

## 2018-09-21 DIAGNOSIS — N390 Urinary tract infection, site not specified: Secondary | ICD-10-CM | POA: Diagnosis not present

## 2018-09-21 DIAGNOSIS — R001 Bradycardia, unspecified: Secondary | ICD-10-CM | POA: Diagnosis not present
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
## 2018-09-22 ENCOUNTER — Ambulatory Visit (INDEPENDENT_AMBULATORY_CARE_PROVIDER_SITE_OTHER): Payer: Medicare HMO | Admitting: Podiatry

## 2018-09-22 ENCOUNTER — Encounter: Payer: Self-pay | Admitting: Podiatry

## 2018-09-22 DIAGNOSIS — I872 Venous insufficiency (chronic) (peripheral): Secondary | ICD-10-CM

## 2018-09-22 DIAGNOSIS — R600 Localized edema: Secondary | ICD-10-CM

## 2018-09-22 DIAGNOSIS — L97521 Non-pressure chronic ulcer of other part of left foot limited to breakdown of skin: Secondary | ICD-10-CM
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
## 2018-09-23 DIAGNOSIS — D649 Anemia, unspecified: Secondary | ICD-10-CM | POA: Diagnosis not present

## 2018-09-23 DIAGNOSIS — Z79899 Other long term (current) drug therapy: Secondary | ICD-10-CM | POA: Diagnosis not present

## 2018-09-23 LAB — BASIC METABOLIC PANEL
BUN: 64 — AB (ref 4–21)
CREATININE: 2.6 — AB (ref 0.6–1.3)
GLUCOSE: 134
POTASSIUM: 3.2 — AB (ref 3.4–5.3)
Sodium: 140 (ref 137–147)

## 2018-09-26 DIAGNOSIS — D649 Anemia, unspecified: Secondary | ICD-10-CM | POA: Diagnosis not present

## 2018-09-26 DIAGNOSIS — I509 Heart failure, unspecified: Secondary | ICD-10-CM | POA: Diagnosis not present

## 2018-09-26 LAB — BASIC METABOLIC PANEL
BUN: 68 — AB (ref 4–21)
Creatinine: 2.5 — AB (ref 0.6–1.3)
Glucose: 234
POTASSIUM: 3 — AB (ref 3.4–5.3)
Sodium: 138 (ref 137–147)

## 2018-09-27 ENCOUNTER — Non-Acute Institutional Stay: Payer: Medicare HMO | Admitting: Primary Care

## 2018-09-27 DIAGNOSIS — Z515 Encounter for palliative care: Secondary | ICD-10-CM | POA: Diagnosis not present

## 2018-09-27 DIAGNOSIS — I25709 Atherosclerosis of coronary artery bypass graft(s), unspecified, with unspecified angina pectoris: Secondary | ICD-10-CM | POA: Diagnosis not present

## 2018-09-27 DIAGNOSIS — E1142 Type 2 diabetes mellitus with diabetic polyneuropathy: Secondary | ICD-10-CM | POA: Diagnosis not present

## 2018-09-27 DIAGNOSIS — R609 Edema, unspecified: Secondary | ICD-10-CM
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
## 2018-09-28 DIAGNOSIS — E119 Type 2 diabetes mellitus without complications: Secondary | ICD-10-CM | POA: Diagnosis not present

## 2018-09-28 DIAGNOSIS — D649 Anemia, unspecified: Secondary | ICD-10-CM | POA: Diagnosis not present

## 2018-09-28 DIAGNOSIS — E559 Vitamin D deficiency, unspecified: Secondary | ICD-10-CM | POA: Diagnosis not present

## 2018-09-28 DIAGNOSIS — I509 Heart failure, unspecified: Secondary | ICD-10-CM | POA: Diagnosis not present

## 2018-09-28 DIAGNOSIS — I10 Essential (primary) hypertension: Secondary | ICD-10-CM | POA: Diagnosis not present

## 2018-09-28 LAB — BASIC METABOLIC PANEL
BUN: 68 — AB (ref 4–21)
Creatinine: 2.6 — AB (ref 0.6–1.3)
Glucose: 171
Potassium: 3.2 — AB (ref 3.4–5.3)
Sodium: 139 (ref 137–147)

## 2018-10-06 ENCOUNTER — Non-Acute Institutional Stay (SKILLED_NURSING_FACILITY): Payer: Medicare HMO | Admitting: Adult Health

## 2018-10-06 ENCOUNTER — Encounter: Payer: Self-pay | Admitting: Adult Health

## 2018-10-06 DIAGNOSIS — I5042 Chronic combined systolic (congestive) and diastolic (congestive) heart failure: Secondary | ICD-10-CM | POA: Diagnosis not present

## 2018-10-06 DIAGNOSIS — I10 Essential (primary) hypertension: Secondary | ICD-10-CM | POA: Diagnosis not present

## 2018-10-06 DIAGNOSIS — E1151 Type 2 diabetes mellitus with diabetic peripheral angiopathy without gangrene: Secondary | ICD-10-CM

## 2018-10-06 DIAGNOSIS — N184 Chronic kidney disease, stage 4 (severe): Secondary | ICD-10-CM | POA: Diagnosis not present

## 2018-10-06 DIAGNOSIS — K5901 Slow transit constipation: Secondary | ICD-10-CM | POA: Diagnosis not present

## 2018-10-06 DIAGNOSIS — N400 Enlarged prostate without lower urinary tract symptoms: Secondary | ICD-10-CM

## 2018-10-06 DIAGNOSIS — I70209 Unspecified atherosclerosis of native arteries of extremities, unspecified extremity: Secondary | ICD-10-CM | POA: Diagnosis not present
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
## 2018-10-20 ENCOUNTER — Ambulatory Visit: Payer: Medicare HMO | Admitting: Podiatry

## 2018-10-20 ENCOUNTER — Encounter: Payer: Self-pay | Admitting: Podiatry

## 2018-10-20 DIAGNOSIS — L97521 Non-pressure chronic ulcer of other part of left foot limited to breakdown of skin: Secondary | ICD-10-CM

## 2018-10-20 DIAGNOSIS — R600 Localized edema: Secondary | ICD-10-CM

## 2018-10-26 LAB — HM DIABETES FOOT EXAM

## 2018-10-28 ENCOUNTER — Encounter: Payer: Self-pay | Admitting: Adult Health

## 2018-10-28 ENCOUNTER — Non-Acute Institutional Stay (SKILLED_NURSING_FACILITY): Payer: Medicare HMO | Admitting: Adult Health

## 2018-10-28 DIAGNOSIS — I5042 Chronic combined systolic (congestive) and diastolic (congestive) heart failure: Secondary | ICD-10-CM

## 2018-10-28 DIAGNOSIS — H1011 Acute atopic conjunctivitis, right eye: Secondary | ICD-10-CM | POA: Diagnosis not present

## 2018-10-28 DIAGNOSIS — N400 Enlarged prostate without lower urinary tract symptoms: Secondary | ICD-10-CM

## 2018-10-28 DIAGNOSIS — N184 Chronic kidney disease, stage 4 (severe): Secondary | ICD-10-CM

## 2018-10-28 DIAGNOSIS — K219 Gastro-esophageal reflux disease without esophagitis: Secondary | ICD-10-CM

## 2018-10-28 DIAGNOSIS — E1122 Type 2 diabetes mellitus with diabetic chronic kidney disease: Secondary | ICD-10-CM | POA: Diagnosis not present
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
## 2018-11-01 DIAGNOSIS — D631 Anemia in chronic kidney disease: Secondary | ICD-10-CM | POA: Diagnosis not present

## 2018-11-01 DIAGNOSIS — I129 Hypertensive chronic kidney disease with stage 1 through stage 4 chronic kidney disease, or unspecified chronic kidney disease: Secondary | ICD-10-CM | POA: Diagnosis not present

## 2018-11-01 DIAGNOSIS — N189 Chronic kidney disease, unspecified: Secondary | ICD-10-CM | POA: Diagnosis not present

## 2018-11-01 DIAGNOSIS — K219 Gastro-esophageal reflux disease without esophagitis: Secondary | ICD-10-CM | POA: Diagnosis not present

## 2018-11-01 DIAGNOSIS — I471 Supraventricular tachycardia: Secondary | ICD-10-CM | POA: Diagnosis not present

## 2018-11-01 DIAGNOSIS — I5040 Unspecified combined systolic (congestive) and diastolic (congestive) heart failure: Secondary | ICD-10-CM | POA: Diagnosis not present

## 2018-11-01 DIAGNOSIS — N2581 Secondary hyperparathyroidism of renal origin: Secondary | ICD-10-CM | POA: Diagnosis not present

## 2018-11-01 DIAGNOSIS — N184 Chronic kidney disease, stage 4 (severe): Secondary | ICD-10-CM | POA: Diagnosis not present

## 2018-11-01 DIAGNOSIS — I2510 Atherosclerotic heart disease of native coronary artery without angina pectoris: Secondary | ICD-10-CM | POA: Diagnosis not present

## 2018-11-01 DIAGNOSIS — E1122 Type 2 diabetes mellitus with diabetic chronic kidney disease: Secondary | ICD-10-CM | POA: Diagnosis not present

## 2018-11-03 ENCOUNTER — Non-Acute Institutional Stay: Payer: Medicare HMO | Admitting: Primary Care

## 2018-11-03 DIAGNOSIS — E1129 Type 2 diabetes mellitus with other diabetic kidney complication: Secondary | ICD-10-CM | POA: Diagnosis not present

## 2018-11-03 DIAGNOSIS — R609 Edema, unspecified: Secondary | ICD-10-CM

## 2018-11-03 DIAGNOSIS — IMO0002 Reserved for concepts with insufficient information to code with codable children: Secondary | ICD-10-CM

## 2018-11-03 DIAGNOSIS — R269 Unspecified abnormalities of gait and mobility: Secondary | ICD-10-CM

## 2018-11-03 DIAGNOSIS — N183 Chronic kidney disease, stage 3 unspecified: Secondary | ICD-10-CM

## 2018-11-03 DIAGNOSIS — E1165 Type 2 diabetes mellitus with hyperglycemia: Secondary | ICD-10-CM | POA: Diagnosis not present

## 2018-11-03 DIAGNOSIS — Z515 Encounter for palliative care: Secondary | ICD-10-CM | POA: Diagnosis not present
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
## 2018-11-07 DIAGNOSIS — I130 Hypertensive heart and chronic kidney disease with heart failure and stage 1 through stage 4 chronic kidney disease, or unspecified chronic kidney disease: Secondary | ICD-10-CM | POA: Diagnosis not present

## 2018-11-07 DIAGNOSIS — I10 Essential (primary) hypertension: Secondary | ICD-10-CM | POA: Diagnosis not present

## 2018-11-07 DIAGNOSIS — I509 Heart failure, unspecified: Secondary | ICD-10-CM | POA: Diagnosis not present

## 2018-11-07 DIAGNOSIS — D649 Anemia, unspecified: Secondary | ICD-10-CM | POA: Diagnosis not present

## 2018-11-07 LAB — BASIC METABOLIC PANEL
BUN: 71 — AB (ref 4–21)
CREATININE: 2.4 — AB (ref 0.6–1.3)
GLUCOSE: 206
POTASSIUM: 3 — AB (ref 3.4–5.3)
Sodium: 140 (ref 137–147)

## 2018-11-10 DIAGNOSIS — Z79899 Other long term (current) drug therapy: Secondary | ICD-10-CM | POA: Diagnosis not present

## 2018-11-10 DIAGNOSIS — I10 Essential (primary) hypertension: Secondary | ICD-10-CM | POA: Diagnosis not present

## 2018-11-10 DIAGNOSIS — D649 Anemia, unspecified: Secondary | ICD-10-CM | POA: Diagnosis not present
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
## 2018-11-30 ENCOUNTER — Ambulatory Visit: Payer: Medicare HMO | Admitting: Nurse Practitioner

## 2018-11-30 ENCOUNTER — Encounter: Payer: Self-pay | Admitting: Nurse Practitioner

## 2018-11-30 VITALS — BP 120/60 | Ht 68.0 in | Wt 191.8 lb

## 2018-11-30 DIAGNOSIS — I5042 Chronic combined systolic (congestive) and diastolic (congestive) heart failure: Secondary | ICD-10-CM | POA: Diagnosis not present

## 2018-11-30 DIAGNOSIS — I4819 Other persistent atrial fibrillation: Secondary | ICD-10-CM

## 2018-11-30 DIAGNOSIS — I25709 Atherosclerosis of coronary artery bypass graft(s), unspecified, with unspecified angina pectoris: Secondary | ICD-10-CM

## 2018-11-30 NOTE — Patient Instructions (Addendum)
We will be checking the following labs today - BMET & CBC   Medication Instructions:    Continue with your current medicines.    If you need a refill on your cardiac medications before your next appointment, please call your pharmacy.     Testing/Procedures To Be Arranged:  N/A  Follow-Up:   See me in 6 months    At [facility], you and your health needs are our priority.  As part of our continuing mission to provide you with exceptional heart care, we have created designated Provider Care Teams.  These Care Teams include your primary Cardiologist (physician) and Advanced Practice Providers (APPs -  Physician Assistants and Nurse Practitioners) who all work together to provide you with the care you need, when you need it.  Special Instructions:  . None  Call the [place] office at [phone] if you have any questions, problems or concerns.

## 2018-12-01 ENCOUNTER — Telehealth: Payer: Self-pay | Admitting: *Deleted

## 2018-12-01 LAB — CBC
Hematocrit: 35.5 % — ABNORMAL LOW (ref 37.5–51.0)
Hemoglobin: 11.8 g/dL — ABNORMAL LOW (ref 13.0–17.7)
MCH: 28.5 pg (ref 26.6–33.0)
MCHC: 33.2 g/dL (ref 31.5–35.7)
MCV: 86 fL (ref 79–97)
Platelets: 184 10*3/uL (ref 150–450)
RBC: 4.14 x10E6/uL (ref 4.14–5.80)
RDW: 15.3 % (ref 11.6–15.4)
WBC: 8.7 10*3/uL (ref 3.4–10.8)

## 2018-12-01 LAB — BASIC METABOLIC PANEL
BUN/Creatinine Ratio: 27 — ABNORMAL HIGH (ref 10–24)
BUN: 69 mg/dL — ABNORMAL HIGH (ref 10–36)
CO2: 24 mmol/L (ref 20–29)
Calcium: 9.3 mg/dL (ref 8.6–10.2)
Chloride: 84 mmol/L — ABNORMAL LOW (ref 96–106)
Creatinine, Ser: 2.52 mg/dL — ABNORMAL HIGH (ref 0.76–1.27)
GFR calc Af Amer: 24 mL/min/{1.73_m2} — ABNORMAL LOW (ref >59)
GFR calc non Af Amer: 21 mL/min/{1.73_m2} — ABNORMAL LOW (ref >59)
Glucose: 300 mg/dL — ABNORMAL HIGH (ref 65–99)
Potassium: 3 mmol/L — ABNORMAL LOW (ref 3.5–5.2)
Sodium: 132 mmol/L — ABNORMAL LOW (ref 134–144)

## 2018-12-01 MED ORDER — POTASSIUM CHLORIDE CRYS ER 20 MEQ PO TBCR: EXTENDED_RELEASE_TABLET | ORAL | Status: DC

## 2018-12-01 NOTE — Telephone Encounter (Signed)
-----   Message from Burtis Junes, NP sent at 12/01/2018 11:07 AM EST ----- Please call the facility - send copy of labs please Needs to increase his potassium to 60meq every day and 60 meq on Tuesdays and Saturdays.  Blood sugar pretty high - would defer to house provider for management.  His kidney function is stable for him. BMET in 2 weeks and fax to [place] please

## 2018-12-01 NOTE — Telephone Encounter (Signed)
Please refer to phone note from today for complete details. Left message for Estevan Oaks, NP to go over lab results and K+ dose increase, with bmet to be done in 2 weeks with results to be faxed to our office.  Julaine Hua, [place] 12/01/2018 11:38 AM   I s/w Gillermina Phy, RN for pt today at [facility]. Reviewed lab results with RN as well as K+ dose increase. Advised per Truitt Merle, NP increase K+ to 40 meq everyday with the EXCEPTION of 60 meq every Tuesday and Saturday. Advised per NP will need BMET to be done in 2 weeks with results to be faxed to Truitt Merle, NP [phone]. RN verbalized understanding to orders given. I will fax these over to [place] as well fax # [phone]. I did also s/w Estevan Oaks, NP who has been made aware of results and recommendations as well. Estevan Oaks, NP thanked me for keeping her in the loop.

## 2018-12-02 ENCOUNTER — Telehealth: Payer: Self-pay | Admitting: *Deleted

## 2018-12-02 NOTE — Telephone Encounter (Signed)
Faxed to [facility] @ [phone]  Tel # [phone] ov note via Epic.

## 2018-12-06 ENCOUNTER — Non-Acute Institutional Stay: Payer: Medicare HMO | Admitting: Primary Care

## 2018-12-08 ENCOUNTER — Non-Acute Institutional Stay (SKILLED_NURSING_FACILITY): Payer: Medicare HMO | Admitting: Internal Medicine

## 2018-12-08 ENCOUNTER — Encounter: Payer: Self-pay | Admitting: Internal Medicine

## 2018-12-08 DIAGNOSIS — N183 Chronic kidney disease, stage 3 unspecified: Secondary | ICD-10-CM

## 2018-12-08 DIAGNOSIS — E1165 Type 2 diabetes mellitus with hyperglycemia: Secondary | ICD-10-CM | POA: Diagnosis not present

## 2018-12-08 DIAGNOSIS — I4819 Other persistent atrial fibrillation: Secondary | ICD-10-CM | POA: Diagnosis not present

## 2018-12-08 DIAGNOSIS — E1129 Type 2 diabetes mellitus with other diabetic kidney complication: Secondary | ICD-10-CM

## 2018-12-08 DIAGNOSIS — E7800 Pure hypercholesterolemia, unspecified: Secondary | ICD-10-CM | POA: Diagnosis not present

## 2018-12-08 DIAGNOSIS — I5042 Chronic combined systolic (congestive) and diastolic (congestive) heart failure: Secondary | ICD-10-CM

## 2018-12-08 DIAGNOSIS — H9193 Unspecified hearing loss, bilateral: Secondary | ICD-10-CM

## 2018-12-08 DIAGNOSIS — IMO0002 Reserved for concepts with insufficient information to code with codable children: Secondary | ICD-10-CM

## 2018-12-08 NOTE — Progress Notes (Signed)
NURSING HOME LOCATION:  [facility] ROOM NUMBER:  [id number]  CODE STATUS:  DNR  PCP:  [name], MD  [address] [place] [address]  This is a nursing facility follow up of chronic medical diagnoses  Interim medical record and care since last [facility] visit was updated with review of diagnostic studies and change in clinical status since last visit were documented.  HPI: He is a permanent resident of the facility with multiple advanced comorbidities which include chronic combined systolic and diastolic congestive heart failure; coronary disease status post bypass graft; insulin-dependent diabetes; essential hypertension; GERD; diabetic polyneuropathy; PAF and CKD. Cardiology follow-up was 11/30/2018;a weekly dose of Zaroxolyn was required for fluid retention.  The challenge clinically had been managing his diuretics in the context of progressive CKD.  Concern was expressed about possible  noncompliance with sodium restriction. He was hospitalized in August 2019 with chest pain; MI was ruled out.  Medical management was recommended and Imdur was increased and beta blocker initiated.  At that time diuretics were held and Zaroxolyn stopped due to the CKD; but Zaroxolyn had to be reinitiated due to dramatic weight gain and volume overload.  As PAF had not recurred, he was no longer felt to be a candidate for anticoagulation.  Cardiology felt that statin could be discontinued despite his coronary artery disease history and history of non-STEMI. Glucoses have ranged from a low of 209 up to a high of 367.  A1c is current and indicates adequate to good control with an value of 7.2%. Cardiology performed labs 11/30/2018.  Sodium is 132, potassium 3.0, creatinine 2.52, hemoglobin 11.8/hematocrit 35.5.  Review of systems: He states that he is doing "pretty fair".  He does have intermittent chest pain under the inferior rib cage bilaterally.  This is not aggravated by position change  or deep breathing.  He states that he did have frequent bowel movements on 1/22 which he relates to medication.  Urinary frequency is a chronic issue with his diuretics.  He has occasional stinging in his feet.  He questions that his ears might be stopped up as he has decreased hearing.   He does not have significant cardiopulmonary symptoms otherwise at this time.  Constitutional: No fever, significant weight change, fatigue  Eyes: No redness, discharge, pain, vision change ENT/mouth: No nasal congestion,  purulent discharge, earache,  sore throat  Cardiovascular: No palpitations, paroxysmal nocturnal dyspnea, claudication  Respiratory: No cough, sputum production, hemoptysis,  significant snoring, apnea   Gastrointestinal: No heartburn, dysphagia, abdominal pain, nausea /vomiting, rectal bleeding, melena Genitourinary: No dysuria, hematuria, pyuria, incontinence, nocturia Musculoskeletal: No joint stiffness, joint swelling, weakness, pain Dermatologic: No rash, pruritus,new change in appearance of skin Neurologic: No dizziness, headache, syncope, seizures Psychiatric: No significant anxiety, depression, insomnia, anorexia Endocrine: No change in hair/skin/nails, excessive thirst, excessive hunger  Hematologic/lymphatic: No significant bruising, lymphadenopathy, abnormal bleeding Allergy/immunology: No itchy/watery eyes, significant sneezing, urticaria, angioedema  Physical exam:  Pertinent or positive findings: The volume of the radio was cranked high.  Pattern alopecia is present.  The maxilla is edentulous.  He has a few lower carious teeth.  Arcus senilis is noted.  Bradycardia is present.  He has a faint left carotid bruit.  Chest was surprisingly clear.  Abdomen is protuberant.  Pedal pulses are decreased.  There is trace edema at the sock line.  He has bland hyperpigmentation of the left shin.  Isolated DIP changes are noted.  Nails in the left hand are  distorted and thickened. Auditory  acuity revealed marked decrease with testing to whisper at 3 feet.  General appearance: Adequately nourished; no acute distress, increased work of breathing is present.   Lymphatic: No lymphadenopathy about the head, neck, axilla. Eyes: No conjunctival inflammation or lid edema is present. There is no scleral icterus. Ears:  External ear exam shows no significant lesions or deformities. Canals clear & TMs unremarkable. Nose:  External nasal examination shows no deformity or inflammation. Nasal mucosa are pink and moist without lesions, exudates Oral exam:  Lips and gums are healthy appearing. There is no oropharyngeal erythema or exudate. Neck:  No thyromegaly, masses, tenderness noted.    Heart:  No gallop, murmur, click, rub .  Lungs: without wheezes, rhonchi, rales, rubs. Abdomen: Bowel sounds are normal. Abdomen is soft and nontender with no organomegaly, hernias, masses. GU: Deferred  Extremities:  No cyanosis, clubbing  Skin: Warm & dry w/o tenting. No significant lesions or rash.  See summary under each active problem in the Problem List with associated updated therapeutic plan

## 2018-12-08 NOTE — Assessment & Plan Note (Signed)
CKD is relatively stable This compromises treatment of both the combined congestive heart failure and the diabetes

## 2018-12-08 NOTE — Assessment & Plan Note (Signed)
Clinically compensated despite the presence of advanced CKD with limitations on diuresis

## 2018-12-08 NOTE — Assessment & Plan Note (Addendum)
12/08/2018 rhythm is regular but rate is slow; atrial fibrillation is not present clinically Not a candidate for anticoagulation

## 2018-12-08 NOTE — Patient Instructions (Signed)
See assessment and plan under each diagnosis in the problem list and acutely for this visit 

## 2018-12-08 NOTE — Assessment & Plan Note (Addendum)
Random glucoses and A1c suggest discordance most likely related to the CKD No change indicated Dietary noncompliance is an issue

## 2018-12-10 ENCOUNTER — Encounter: Payer: Self-pay | Admitting: Internal Medicine

## 2018-12-10 NOTE — Assessment & Plan Note (Signed)
Cardiology feels statin can be D/Ced
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
## 2018-12-13 ENCOUNTER — Non-Acute Institutional Stay: Payer: Medicare HMO | Admitting: Primary Care

## 2018-12-14 ENCOUNTER — Non-Acute Institutional Stay: Payer: Medicare HMO | Admitting: Primary Care

## 2018-12-28 ENCOUNTER — Non-Acute Institutional Stay: Payer: Medicare HMO | Admitting: Primary Care

## 2018-12-28 DIAGNOSIS — Z515 Encounter for palliative care: Secondary | ICD-10-CM | POA: Diagnosis not present

## 2018-12-28 DIAGNOSIS — I25709 Atherosclerosis of coronary artery bypass graft(s), unspecified, with unspecified angina pectoris: Secondary | ICD-10-CM

## 2018-12-28 DIAGNOSIS — E1129 Type 2 diabetes mellitus with other diabetic kidney complication: Secondary | ICD-10-CM | POA: Diagnosis not present

## 2018-12-28 DIAGNOSIS — E1165 Type 2 diabetes mellitus with hyperglycemia: Secondary | ICD-10-CM | POA: Diagnosis not present

## 2018-12-28 DIAGNOSIS — IMO0002 Reserved for concepts with insufficient information to code with codable children: Secondary | ICD-10-CM

## 2018-12-28 DIAGNOSIS — R609 Edema, unspecified: Secondary | ICD-10-CM | POA: Diagnosis not present
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
## 2018-12-29 ENCOUNTER — Encounter: Payer: Self-pay | Admitting: Adult Health

## 2018-12-29 ENCOUNTER — Non-Acute Institutional Stay (SKILLED_NURSING_FACILITY): Payer: Medicare HMO | Admitting: Adult Health

## 2018-12-29 DIAGNOSIS — H6123 Impacted cerumen, bilateral: Secondary | ICD-10-CM | POA: Diagnosis not present

## 2018-12-29 DIAGNOSIS — I4819 Other persistent atrial fibrillation: Secondary | ICD-10-CM | POA: Diagnosis not present
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
## 2018-12-30 ENCOUNTER — Encounter: Payer: Self-pay | Admitting: Endocrinology

## 2018-12-30 ENCOUNTER — Ambulatory Visit: Payer: Medicare HMO | Admitting: Endocrinology

## 2018-12-30 VITALS — BP 130/58 | HR 63 | Ht 68.0 in | Wt 190.2 lb

## 2018-12-30 DIAGNOSIS — I70209 Unspecified atherosclerosis of native arteries of extremities, unspecified extremity: Secondary | ICD-10-CM

## 2018-12-30 DIAGNOSIS — E1151 Type 2 diabetes mellitus with diabetic peripheral angiopathy without gangrene: Secondary | ICD-10-CM

## 2018-12-30 LAB — POCT GLYCOSYLATED HEMOGLOBIN (HGB A1C): Hemoglobin A1C: 9.8 % — AB (ref 4.0–5.6)

## 2018-12-30 MED ORDER — INSULIN ASPART 100 UNIT/ML FLEXPEN: 24.0000 [IU] | PEN_INJECTOR | Freq: Three times a day (TID) | SUBCUTANEOUS | 11 refills | Status: DC

## 2018-12-30 NOTE — Patient Instructions (Addendum)
check resident's blood sugar 4 times a day: before the 3 meals, and at bedtime.  also check if you have symptoms of your blood sugar being too high or too low.  please keep a record of the readings and bring it to your next appointment here (or you can bring the meter itself).  please call us sooner if your blood sugar goes below 70, or if most cbg's are over 200.  Please send cbg record with resident to appointment here.   Take novolog, 24 units 3 times a day (with each meal), and:  Please continue the same basaglar: 6 units at bedtime.  Also, take extra novolog, 2 units for any cbg over 300.  Please fax [place] cbg record next week.   Please come back for a follow-up appointment in 2 months.
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
## 2019-01-03 ENCOUNTER — Non-Acute Institutional Stay (SKILLED_NURSING_FACILITY): Payer: Medicare HMO | Admitting: Internal Medicine

## 2019-01-03 ENCOUNTER — Encounter: Payer: Self-pay | Admitting: Internal Medicine

## 2019-01-03 DIAGNOSIS — E1151 Type 2 diabetes mellitus with diabetic peripheral angiopathy without gangrene: Secondary | ICD-10-CM | POA: Diagnosis not present

## 2019-01-03 DIAGNOSIS — E538 Deficiency of other specified B group vitamins: Secondary | ICD-10-CM

## 2019-01-03 DIAGNOSIS — N183 Chronic kidney disease, stage 3 unspecified: Secondary | ICD-10-CM

## 2019-01-03 DIAGNOSIS — IMO0002 Reserved for concepts with insufficient information to code with codable children: Secondary | ICD-10-CM

## 2019-01-03 DIAGNOSIS — E1165 Type 2 diabetes mellitus with hyperglycemia: Secondary | ICD-10-CM | POA: Diagnosis not present

## 2019-01-03 DIAGNOSIS — I5042 Chronic combined systolic (congestive) and diastolic (congestive) heart failure: Secondary | ICD-10-CM

## 2019-01-03 DIAGNOSIS — E1129 Type 2 diabetes mellitus with other diabetic kidney complication: Secondary | ICD-10-CM | POA: Diagnosis not present

## 2019-01-03 DIAGNOSIS — I70209 Unspecified atherosclerosis of native arteries of extremities, unspecified extremity: Secondary | ICD-10-CM | POA: Diagnosis not present

## 2019-01-03 DIAGNOSIS — I25709 Atherosclerosis of coronary artery bypass graft(s), unspecified, with unspecified angina pectoris: Secondary | ICD-10-CM
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
## 2019-01-04 DIAGNOSIS — E1122 Type 2 diabetes mellitus with diabetic chronic kidney disease: Secondary | ICD-10-CM | POA: Diagnosis not present

## 2019-01-04 DIAGNOSIS — I5040 Unspecified combined systolic (congestive) and diastolic (congestive) heart failure: Secondary | ICD-10-CM | POA: Diagnosis not present

## 2019-01-04 DIAGNOSIS — N2581 Secondary hyperparathyroidism of renal origin: Secondary | ICD-10-CM | POA: Diagnosis not present

## 2019-01-04 DIAGNOSIS — K219 Gastro-esophageal reflux disease without esophagitis: Secondary | ICD-10-CM | POA: Diagnosis not present

## 2019-01-04 DIAGNOSIS — N184 Chronic kidney disease, stage 4 (severe): Secondary | ICD-10-CM | POA: Diagnosis not present

## 2019-01-04 DIAGNOSIS — D649 Anemia, unspecified: Secondary | ICD-10-CM | POA: Diagnosis not present

## 2019-01-04 DIAGNOSIS — I471 Supraventricular tachycardia: Secondary | ICD-10-CM | POA: Diagnosis not present

## 2019-01-04 DIAGNOSIS — I10 Essential (primary) hypertension: Secondary | ICD-10-CM | POA: Diagnosis not present

## 2019-01-04 DIAGNOSIS — Z79899 Other long term (current) drug therapy: Secondary | ICD-10-CM | POA: Diagnosis not present

## 2019-01-04 DIAGNOSIS — I2510 Atherosclerotic heart disease of native coronary artery without angina pectoris: Secondary | ICD-10-CM | POA: Diagnosis not present

## 2019-01-04 DIAGNOSIS — E559 Vitamin D deficiency, unspecified: Secondary | ICD-10-CM | POA: Diagnosis not present

## 2019-01-04 DIAGNOSIS — D631 Anemia in chronic kidney disease: Secondary | ICD-10-CM | POA: Diagnosis not present

## 2019-01-04 DIAGNOSIS — I509 Heart failure, unspecified: Secondary | ICD-10-CM | POA: Diagnosis not present

## 2019-01-04 DIAGNOSIS — I129 Hypertensive chronic kidney disease with stage 1 through stage 4 chronic kidney disease, or unspecified chronic kidney disease: Secondary | ICD-10-CM | POA: Diagnosis not present

## 2019-01-04 DIAGNOSIS — I5043 Acute on chronic combined systolic (congestive) and diastolic (congestive) heart failure: Secondary | ICD-10-CM | POA: Diagnosis not present

## 2019-01-05 ENCOUNTER — Non-Acute Institutional Stay (SKILLED_NURSING_FACILITY): Payer: Medicare HMO | Admitting: Internal Medicine

## 2019-01-05 DIAGNOSIS — E876 Hypokalemia: Secondary | ICD-10-CM | POA: Diagnosis not present

## 2019-01-05 DIAGNOSIS — N183 Chronic kidney disease, stage 3 unspecified: Secondary | ICD-10-CM

## 2019-01-05 DIAGNOSIS — I5042 Chronic combined systolic (congestive) and diastolic (congestive) heart failure: Secondary | ICD-10-CM

## 2019-01-05 DIAGNOSIS — R790 Abnormal level of blood mineral: Secondary | ICD-10-CM | POA: Diagnosis not present

## 2019-01-06 DIAGNOSIS — E785 Hyperlipidemia, unspecified: Secondary | ICD-10-CM | POA: Diagnosis not present

## 2019-01-06 DIAGNOSIS — E1122 Type 2 diabetes mellitus with diabetic chronic kidney disease: Secondary | ICD-10-CM | POA: Diagnosis not present

## 2019-01-06 DIAGNOSIS — I10 Essential (primary) hypertension: Secondary | ICD-10-CM | POA: Diagnosis not present

## 2019-01-06 DIAGNOSIS — D649 Anemia, unspecified: Secondary | ICD-10-CM | POA: Diagnosis not present

## 2019-01-07 ENCOUNTER — Encounter: Payer: Self-pay | Admitting: Internal Medicine
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
## 2019-01-09 DIAGNOSIS — I10 Essential (primary) hypertension: Secondary | ICD-10-CM | POA: Diagnosis not present

## 2019-01-09 DIAGNOSIS — D649 Anemia, unspecified: Secondary | ICD-10-CM | POA: Diagnosis not present

## 2019-01-12 ENCOUNTER — Non-Acute Institutional Stay (SKILLED_NURSING_FACILITY): Payer: Medicare HMO | Admitting: Internal Medicine

## 2019-01-12 ENCOUNTER — Encounter: Payer: Self-pay | Admitting: Internal Medicine

## 2019-01-12 DIAGNOSIS — E876 Hypokalemia: Secondary | ICD-10-CM | POA: Diagnosis not present

## 2019-01-12 DIAGNOSIS — H5789 Other specified disorders of eye and adnexa: Secondary | ICD-10-CM

## 2019-01-12 DIAGNOSIS — I70209 Unspecified atherosclerosis of native arteries of extremities, unspecified extremity: Secondary | ICD-10-CM | POA: Diagnosis not present

## 2019-01-12 DIAGNOSIS — E1151 Type 2 diabetes mellitus with diabetic peripheral angiopathy without gangrene: Secondary | ICD-10-CM

## 2019-01-12 DIAGNOSIS — H109 Unspecified conjunctivitis: Secondary | ICD-10-CM
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
## 2019-01-13 DIAGNOSIS — I10 Essential (primary) hypertension: Secondary | ICD-10-CM | POA: Diagnosis not present

## 2019-01-13 DIAGNOSIS — E1122 Type 2 diabetes mellitus with diabetic chronic kidney disease: Secondary | ICD-10-CM | POA: Diagnosis not present

## 2019-01-13 DIAGNOSIS — E785 Hyperlipidemia, unspecified: Secondary | ICD-10-CM | POA: Diagnosis not present

## 2019-01-13 DIAGNOSIS — D649 Anemia, unspecified: Secondary | ICD-10-CM | POA: Diagnosis not present

## 2019-01-13 LAB — BASIC METABOLIC PANEL
BUN: 77 — AB (ref 4–21)
Creatinine: 2.5 — AB (ref 0.6–1.3)
Glucose: 200
Potassium: 3.1 — AB (ref 3.4–5.3)
Sodium: 140 (ref 137–147)

## 2019-01-17 DIAGNOSIS — R7989 Other specified abnormal findings of blood chemistry: Secondary | ICD-10-CM | POA: Diagnosis not present

## 2019-01-17 DIAGNOSIS — D649 Anemia, unspecified: Secondary | ICD-10-CM | POA: Diagnosis not present

## 2019-01-17 LAB — BASIC METABOLIC PANEL  EGFR
Calcium: 9.4
Carbon Dioxide, Total: 31
Chloride: 93

## 2019-01-17 LAB — BASIC METABOLIC PANEL
BUN: 74 — AB (ref 4–21)
Creatinine: 2.5 — AB (ref 0.6–1.3)
Glucose: 200
Potassium: 3.1 — AB (ref 3.4–5.3)
Sodium: 141 (ref 137–147)

## 2019-01-25 ENCOUNTER — Encounter: Payer: Self-pay | Admitting: Internal Medicine

## 2019-01-25 ENCOUNTER — Non-Acute Institutional Stay (SKILLED_NURSING_FACILITY): Payer: Medicare HMO | Admitting: Internal Medicine

## 2019-01-25 DIAGNOSIS — I471 Supraventricular tachycardia: Secondary | ICD-10-CM | POA: Diagnosis not present

## 2019-01-25 DIAGNOSIS — I10 Essential (primary) hypertension: Secondary | ICD-10-CM | POA: Diagnosis not present

## 2019-01-25 DIAGNOSIS — E1129 Type 2 diabetes mellitus with other diabetic kidney complication: Secondary | ICD-10-CM | POA: Diagnosis not present

## 2019-01-25 DIAGNOSIS — IMO0002 Reserved for concepts with insufficient information to code with codable children: Secondary | ICD-10-CM

## 2019-01-25 DIAGNOSIS — E538 Deficiency of other specified B group vitamins: Secondary | ICD-10-CM

## 2019-01-25 DIAGNOSIS — N183 Chronic kidney disease, stage 3 unspecified: Secondary | ICD-10-CM

## 2019-01-25 DIAGNOSIS — Z79899 Other long term (current) drug therapy: Secondary | ICD-10-CM | POA: Diagnosis not present

## 2019-01-25 DIAGNOSIS — D649 Anemia, unspecified: Secondary | ICD-10-CM | POA: Diagnosis not present

## 2019-01-25 DIAGNOSIS — E1165 Type 2 diabetes mellitus with hyperglycemia: Secondary | ICD-10-CM | POA: Diagnosis not present

## 2019-01-25 DIAGNOSIS — I5042 Chronic combined systolic (congestive) and diastolic (congestive) heart failure: Secondary | ICD-10-CM | POA: Diagnosis not present

## 2019-01-25 LAB — BASIC METABOLIC PANEL
BUN: 71 — AB (ref 4–21)
Creatinine: 2.4 — AB (ref 0.6–1.3)
Glucose: 256
Potassium: 3.8 (ref 3.4–5.3)
Sodium: 134 — AB (ref 137–147)

## 2019-01-25 LAB — VITAMIN B12: Vitamin B-12: 658
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
## 2019-01-26 ENCOUNTER — Other Ambulatory Visit: Payer: Self-pay

## 2019-01-26 ENCOUNTER — Non-Acute Institutional Stay: Payer: Medicare HMO | Admitting: Adult Health Nurse Practitioner

## 2019-01-26 DIAGNOSIS — D519 Vitamin B12 deficiency anemia, unspecified: Secondary | ICD-10-CM | POA: Diagnosis not present

## 2019-01-26 DIAGNOSIS — Z515 Encounter for palliative care: Secondary | ICD-10-CM

## 2019-01-26 DIAGNOSIS — D649 Anemia, unspecified: Secondary | ICD-10-CM | POA: Diagnosis not present

## 2019-01-26 DIAGNOSIS — D518 Other vitamin B12 deficiency anemias: Secondary | ICD-10-CM | POA: Diagnosis not present
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
## 2019-01-28 DIAGNOSIS — D649 Anemia, unspecified: Secondary | ICD-10-CM | POA: Diagnosis not present

## 2019-01-28 DIAGNOSIS — Z79899 Other long term (current) drug therapy: Secondary | ICD-10-CM | POA: Diagnosis not present

## 2019-01-28 DIAGNOSIS — D518 Other vitamin B12 deficiency anemias: Secondary | ICD-10-CM | POA: Diagnosis not present

## 2019-01-28 LAB — BASIC METABOLIC PANEL
BUN: 72 — AB (ref 4–21)
Creatinine: 2.6 — AB (ref 0.6–1.3)
Glucose: 225
Potassium: 3.5 (ref 3.4–5.3)
Sodium: 141 (ref 137–147)

## 2019-01-31 DIAGNOSIS — E119 Type 2 diabetes mellitus without complications: Secondary | ICD-10-CM | POA: Diagnosis not present

## 2019-01-31 DIAGNOSIS — D649 Anemia, unspecified: Secondary | ICD-10-CM | POA: Diagnosis not present

## 2019-01-31 DIAGNOSIS — D518 Other vitamin B12 deficiency anemias: Secondary | ICD-10-CM | POA: Diagnosis not present

## 2019-01-31 DIAGNOSIS — E1169 Type 2 diabetes mellitus with other specified complication: Secondary | ICD-10-CM | POA: Diagnosis not present

## 2019-01-31 DIAGNOSIS — Z79899 Other long term (current) drug therapy: Secondary | ICD-10-CM | POA: Diagnosis not present

## 2019-01-31 DIAGNOSIS — I5043 Acute on chronic combined systolic (congestive) and diastolic (congestive) heart failure: Secondary | ICD-10-CM | POA: Diagnosis not present

## 2019-01-31 LAB — BASIC METABOLIC PANEL
BUN: 82 — AB (ref 4–21)
Creatinine: 2.6 — AB (ref 0.6–1.3)
Glucose: 167
Potassium: 2.9 — AB (ref 3.4–5.3)
Sodium: 139 (ref 137–147)

## 2019-02-01 DIAGNOSIS — M6281 Muscle weakness (generalized): Secondary | ICD-10-CM | POA: Diagnosis not present

## 2019-02-01 DIAGNOSIS — D649 Anemia, unspecified: Secondary | ICD-10-CM | POA: Diagnosis not present

## 2019-02-01 LAB — BASIC METABOLIC PANEL
BUN: 86 — AB (ref 4–21)
Creatinine: 2.6 — AB (ref 0.6–1.3)
Glucose: 169
Potassium: 3.1 — AB (ref 3.4–5.3)
Sodium: 137 (ref 137–147)

## 2019-02-02 ENCOUNTER — Encounter: Payer: Self-pay | Admitting: Internal Medicine

## 2019-02-02 ENCOUNTER — Non-Acute Institutional Stay (SKILLED_NURSING_FACILITY): Payer: Medicare HMO | Admitting: Internal Medicine

## 2019-02-02 DIAGNOSIS — I5042 Chronic combined systolic (congestive) and diastolic (congestive) heart failure: Secondary | ICD-10-CM | POA: Diagnosis not present

## 2019-02-02 DIAGNOSIS — N183 Chronic kidney disease, stage 3 unspecified: Secondary | ICD-10-CM

## 2019-02-02 DIAGNOSIS — E876 Hypokalemia: Secondary | ICD-10-CM | POA: Diagnosis not present

## 2019-02-02 DIAGNOSIS — Z79899 Other long term (current) drug therapy: Secondary | ICD-10-CM | POA: Diagnosis not present

## 2019-02-02 DIAGNOSIS — M6281 Muscle weakness (generalized): Secondary | ICD-10-CM | POA: Diagnosis not present

## 2019-02-02 DIAGNOSIS — D649 Anemia, unspecified: Secondary | ICD-10-CM | POA: Diagnosis not present

## 2019-02-02 LAB — BASIC METABOLIC PANEL
BUN: 85 — AB (ref 4–21)
Creatinine: 3 — AB (ref 0.6–1.3)
Glucose: 181
Potassium: 2.7 — AB (ref 3.4–5.3)
Sodium: 139 (ref 137–147)
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
## 2019-02-03 DIAGNOSIS — I5043 Acute on chronic combined systolic (congestive) and diastolic (congestive) heart failure: Secondary | ICD-10-CM | POA: Diagnosis not present

## 2019-02-03 DIAGNOSIS — D649 Anemia, unspecified: Secondary | ICD-10-CM | POA: Diagnosis not present

## 2019-02-03 DIAGNOSIS — N183 Chronic kidney disease, stage 3 (moderate): Secondary | ICD-10-CM | POA: Diagnosis not present

## 2019-02-03 LAB — BASIC METABOLIC PANEL
BUN: 84 — AB (ref 4–21)
Creatinine: 2.7 — AB (ref 0.6–1.3)
Glucose: 267
Potassium: 2.9 — AB (ref 3.4–5.3)
Sodium: 141 (ref 137–147)

## 2019-02-06 DIAGNOSIS — D649 Anemia, unspecified: Secondary | ICD-10-CM | POA: Diagnosis not present

## 2019-02-06 DIAGNOSIS — I509 Heart failure, unspecified: Secondary | ICD-10-CM | POA: Diagnosis not present

## 2019-02-06 LAB — BASIC METABOLIC PANEL
BUN: 79 — AB (ref 4–21)
Creatinine: 2.8 — AB (ref 0.6–1.3)
Glucose: 178
Potassium: 2.9 — AB (ref 3.4–5.3)
Sodium: 141 (ref 137–147)

## 2019-02-12 DIAGNOSIS — Z7901 Long term (current) use of anticoagulants: Secondary | ICD-10-CM | POA: Diagnosis not present

## 2019-02-12 DIAGNOSIS — D649 Anemia, unspecified: Secondary | ICD-10-CM | POA: Diagnosis not present

## 2019-02-12 LAB — CBC AND DIFFERENTIAL
HCT: 32 — AB (ref 41–53)
Hemoglobin: 10.7 — AB (ref 13.5–17.5)
Platelets: 152 (ref 150–399)
WBC: 9.5

## 2019-02-13 ENCOUNTER — Encounter: Payer: Self-pay | Admitting: Internal Medicine

## 2019-02-13 ENCOUNTER — Non-Acute Institutional Stay (SKILLED_NURSING_FACILITY): Payer: Medicare HMO | Admitting: Internal Medicine

## 2019-02-13 DIAGNOSIS — I10 Essential (primary) hypertension: Secondary | ICD-10-CM | POA: Diagnosis not present

## 2019-02-13 DIAGNOSIS — N183 Chronic kidney disease, stage 3 unspecified: Secondary | ICD-10-CM

## 2019-02-13 DIAGNOSIS — I5043 Acute on chronic combined systolic (congestive) and diastolic (congestive) heart failure: Secondary | ICD-10-CM | POA: Diagnosis not present

## 2019-02-13 DIAGNOSIS — I5042 Chronic combined systolic (congestive) and diastolic (congestive) heart failure: Secondary | ICD-10-CM | POA: Diagnosis not present

## 2019-02-13 DIAGNOSIS — I130 Hypertensive heart and chronic kidney disease with heart failure and stage 1 through stage 4 chronic kidney disease, or unspecified chronic kidney disease: Secondary | ICD-10-CM | POA: Diagnosis not present

## 2019-02-13 DIAGNOSIS — D649 Anemia, unspecified: Secondary | ICD-10-CM | POA: Diagnosis not present

## 2019-02-13 DIAGNOSIS — E876 Hypokalemia: Secondary | ICD-10-CM | POA: Diagnosis not present

## 2019-02-13 LAB — BASIC METABOLIC PANEL
BUN: 86 — AB (ref 4–21)
Creatinine: 2.8 — AB (ref 0.6–1.3)
Glucose: 109
Potassium: 2.8 — AB (ref 3.4–5.3)
Sodium: 142 (ref 137–147)

## 2019-02-13 LAB — CBC AND DIFFERENTIAL
HCT: 31 — AB (ref 41–53)
Hemoglobin: 10.6 — AB (ref 13.5–17.5)
Platelets: 147 — AB (ref 150–399)
WBC: 9
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
## 2019-02-14 ENCOUNTER — Non-Acute Institutional Stay (SKILLED_NURSING_FACILITY): Payer: Medicare HMO | Admitting: Internal Medicine

## 2019-02-14 ENCOUNTER — Encounter: Payer: Self-pay | Admitting: Internal Medicine

## 2019-02-14 DIAGNOSIS — I5042 Chronic combined systolic (congestive) and diastolic (congestive) heart failure: Secondary | ICD-10-CM

## 2019-02-14 DIAGNOSIS — N183 Chronic kidney disease, stage 3 unspecified: Secondary | ICD-10-CM

## 2019-02-14 DIAGNOSIS — N184 Chronic kidney disease, stage 4 (severe): Secondary | ICD-10-CM | POA: Diagnosis not present

## 2019-02-14 DIAGNOSIS — I509 Heart failure, unspecified: Secondary | ICD-10-CM | POA: Diagnosis not present

## 2019-02-14 DIAGNOSIS — E1122 Type 2 diabetes mellitus with diabetic chronic kidney disease: Secondary | ICD-10-CM | POA: Diagnosis not present

## 2019-02-14 DIAGNOSIS — D649 Anemia, unspecified: Secondary | ICD-10-CM | POA: Diagnosis not present

## 2019-02-14 LAB — BASIC METABOLIC PANEL
BUN: 81 — AB (ref 4–21)
Creatinine: 2.6 — AB (ref 0.6–1.3)
Glucose: 312
Potassium: 3.8 (ref 3.4–5.3)
Sodium: 139 (ref 137–147)

## 2019-02-14 NOTE — Patient Instructions (Addendum)
See assessment and plan under each diagnosis in the problem list and acutely for this visit Total time 29  minutes; greater than 50% of the visit spent counseling patient on pathophysiology and interplay of his congestive heart failure, advanced CKD, and diabetes and coordinating care for problems addressed at this encounter

## 2019-02-14 NOTE — Assessment & Plan Note (Addendum)
I reviewed the sodium content of the snacks and foods @ bedside. I recommended less than 2000 mg of sodium per day.  Encouraged him to discuss this with his cardiologist at the next visit. Explained the role of fluid retention and cardiac decompensation with increased sodium intake and the negative impact on renal function and potassium levels with aggressive diuresis.

## 2019-02-14 NOTE — Progress Notes (Signed)
NURSING HOME LOCATION:  [facility] ROOM NUMBER: 323/A   CODE STATUS:  DNR  PCP:  [name] MD.   This is a nursing facility follow up of chronic medical diagnoses  Interim medical record and care since last [place] visit was updated with review of diagnostic studies and change in clinical status since last visit were documented.  HPI: The patient has chronic congestive heart failure as well as advanced CKD in the setting of poorly controlled diabetes.  Aggressive diuresis has been associated with significant hypokalemia as well as some progression of his renal insufficiency.  Nephrology and Cardiology follow-ups have been delayed due to the coronavirus epidemic precluding routine follow-ups. On 3/31 glucose was 312, creatinine 2.62, BUN 81.3, and GFR 19.69.Marland Kitchen  These values and their significance was discussed with the patient.  Review of systems: He was cognizant of the date but virtually unaware of the coronavirus.  He did not know why the office visits had been canceled by his nephrologist and cardiologist. Despite these lab data reviewed above ,he denies any active cardiopulmonary or GU symptoms.  His only concern is pruritus over the mid posterior thoracic spine.  Apparently topical cortisone has been of benefit. Although he vehemently denies intake of sweets or sodium he has multiple's salt-containing containers at bedside as well as canned food and snacks which are high in sodium.  Specifically peanut butter crackers which are half eaten have 240 mg of sodium per package and a can of beans & weenies contained 850 mg of sodium.  As I explained the pathophysiology of congestive heart failure and its relationship to sodium overload and risks of uncontrolled diabetes and  CKD; he became very defensive about the foods and condiments in his room.  Constitutional: No fever, significant weight change, fatigue  Eyes: No redness, discharge, pain, vision change ENT/mouth:  No nasal congestion,  purulent discharge, earache, change in hearing, sore throat  Cardiovascular: No chest pain, palpitations, paroxysmal nocturnal dyspnea, claudication, edema  Respiratory: No cough, sputum production, hemoptysis, DOE, significant snoring, apnea   Gastrointestinal: No heartburn, dysphagia, abdominal pain, nausea /vomiting, rectal bleeding, melena, change in bowels Genitourinary: No dysuria, hematuria, pyuria, incontinence, nocturia Musculoskeletal: No joint stiffness, joint swelling, weakness, pain Neurologic: No dizziness, headache, syncope, seizures, numbness, tingling Psychiatric: No significant anxiety, depression, insomnia, anorexia Endocrine: No change in hair/skin/nails, excessive thirst, excessive hunger, excessive urination  Hematologic/lymphatic: No significant bruising, lymphadenopathy, abnormal bleeding Allergy/immunology: No itchy/watery eyes, significant sneezing, urticaria, angioedema  Physical exam:  Pertinent or positive findings: He is sitting upright on the side of the bed in no distress.  Pattern alopecia is present.  He has dense arcus senilis.  He is edentulous except for severely carious mandibular teeth which are eroded to and below the gumline.  Breath sounds are decreased without rales.  Heart rhythm rate is slow and slightly irregular.  The first heart sound is increased. Abdomen is protuberant. There is 1/2+ edema at the sock line.  Pedal pulses not palpable.  He has marked DIP/PIP arthritic changes.  The fingernails of the right hand are markedly deformed and pitted.  He has myriad flat keratoses over the posterior thorax.  There is no rash or cellulitis present.  General appearance: Adequately nourished; no acute distress, increased work of breathing is present.   Lymphatic: No lymphadenopathy about the head, neck, axilla. Eyes: No conjunctival inflammation or lid edema is present. There is no scleral icterus. Ears:  External ear exam shows no  significant lesions  or deformities.   Nose:  External nasal examination shows no deformity or inflammation. Nasal mucosa are pink and moist without lesions, exudates Oral exam:  Lips  are healthy appearing. There is no oropharyngeal erythema or exudate. Neck:  No thyromegaly, masses, tenderness noted.    Heart:  No gallop, murmur, click, rub .  Lungs: without wheezes, rhonchi, rales, rubs. Abdomen: Bowel sounds are normal. Abdomen is soft and nontender with no organomegaly, hernias, masses. GU: Deferred  Extremities:  No cyanosis, clubbing Neurologic exam : Balance, Rhomberg, finger to nose testing could not be completed due to clinical state Skin: Warm & dry w/o tenting. No significant  rash.  See summary under each active problem in the Problem List with associated updated therapeutic plan

## 2019-02-14 NOTE — Assessment & Plan Note (Signed)
I shared the BMET results and explained the significance of a creatinine of 2.62, BUN of 81.3, and GFR of 19.69 with him.  Also attempted to explain the potential negative impact of aggressive diuresis on renal function.  Nephrology follow-up will be pursued following resolution of the corona epidemic.

## 2019-02-15 ENCOUNTER — Ambulatory Visit: Payer: Medicare HMO | Admitting: Podiatry

## 2019-02-16 DIAGNOSIS — H04122 Dry eye syndrome of left lacrimal gland: Secondary | ICD-10-CM | POA: Diagnosis not present

## 2019-02-17 ENCOUNTER — Encounter: Payer: Self-pay | Admitting: Internal Medicine

## 2019-02-21 DIAGNOSIS — Z79899 Other long term (current) drug therapy: Secondary | ICD-10-CM | POA: Diagnosis not present

## 2019-02-21 DIAGNOSIS — D649 Anemia, unspecified: Secondary | ICD-10-CM | POA: Diagnosis not present

## 2019-02-21 LAB — BASIC METABOLIC PANEL  EGFR
Calcium: 9.7
Carbon Dioxide, Total: 30
Chloride: 93

## 2019-02-21 LAB — BASIC METABOLIC PANEL
BUN: 78 — AB (ref 4–21)
Creatinine: 2.6 — AB (ref 0.6–1.3)
Glucose: 151
Potassium: 3 — AB (ref 3.4–5.3)
Sodium: 139 (ref 137–147)

## 2019-02-28 ENCOUNTER — Encounter: Payer: Self-pay | Admitting: Internal Medicine

## 2019-02-28 ENCOUNTER — Non-Acute Institutional Stay (SKILLED_NURSING_FACILITY): Payer: Medicare HMO | Admitting: Internal Medicine

## 2019-02-28 DIAGNOSIS — E1122 Type 2 diabetes mellitus with diabetic chronic kidney disease: Secondary | ICD-10-CM | POA: Diagnosis not present

## 2019-02-28 DIAGNOSIS — I4819 Other persistent atrial fibrillation: Secondary | ICD-10-CM

## 2019-02-28 DIAGNOSIS — N184 Chronic kidney disease, stage 4 (severe): Secondary | ICD-10-CM | POA: Diagnosis not present

## 2019-02-28 DIAGNOSIS — I5042 Chronic combined systolic (congestive) and diastolic (congestive) heart failure: Secondary | ICD-10-CM

## 2019-02-28 LAB — BASIC METABOLIC PANEL  EGFR
Albumin: 3.9
Calcium: 9
Calcium: 9
Calcium: 9
Calcium: 9.1
Calcium: 9.2
Calcium: 9.4
Calcium: 9.5
Calcium: 9.5
Calcium: 9.5
Calcium: 9.7
Carbon Dioxide, Total: 31
Carbon Dioxide, Total: 29
Carbon Dioxide, Total: 30
Carbon Dioxide, Total: 31
Carbon Dioxide, Total: 31
Carbon Dioxide, Total: 32
Carbon Dioxide, Total: 32
Carbon Dioxide, Total: 32
Carbon Dioxide, Total: 34
Chloride: 92
Chloride: 87
Chloride: 88
Chloride: 89
Chloride: 89
Chloride: 90
Chloride: 91
Chloride: 91
Chloride: 93
Chloride: 93
Magnesium: 1.3
Magnesium: 1.5
Magnesium: 1.5
Phosphorus: 5.5

## 2019-02-28 NOTE — Assessment & Plan Note (Signed)
Clinically he is compensated with good control of the peripheral edema.  This may have been impacted by the corona quarantine which precludes family bringing and foods.

## 2019-02-28 NOTE — Assessment & Plan Note (Signed)
Bradyarrhythmia present, rate well controlled

## 2019-02-28 NOTE — Assessment & Plan Note (Signed)
Creatinine is stable at 2.6.  Despite CKD which is advanced, the aggressive diuresis for his combined systolic and diastolic heart failure has resulted and persistent hypokalemia.  This is been relatively stable on the present regimen and will not be changed.

## 2019-02-28 NOTE — Progress Notes (Signed)
NURSING HOME LOCATION:  [facility] ROOM NUMBER: 323/A   CODE STATUS:  DNR  PCP:  [name] MD.  This is a nursing facility follow up of chronic medical diagnoses  Interim medical record and care since last [place] visit was updated with review of diagnostic studies and change in clinical status since last visit were documented.  HPI: He has combined chronic systolic diastolic congestive heart failure in the context of advanced CKD and poorly controlled diabetes. He denies any active cardiopulmonary symptoms at this time. Specifically he denies paroxysmal nocturnal dyspnea or anginal equivalent.  He feels that he is "doing pretty good".  He feels that the peripheral edema is "staying down".  In fact the only complaint he voices is occasional nocturia and incontinence. Labs are current and reveal recurrent hypokalemia in the context of aggressive diuresis.  This is despite supplementation up to 80 mEq of potassium a day. Glucoses are markedly variable ranging from a low of 133 up to a high of 389.  No hypoglycemia has been reported.  A1c is current as of 2/14 and exhibits poorly controlled diabetes with an A1c of 9.8%.he is seen by an Endocrinologist.  Review of systems: He gave the date as February 26, 2019.  Although he was listening to public radio he could not tell me why the parking lot outside his room was predominantly empty.  He replied that they were "probably parking somewhere else".  He could not define why I was wearing a mask or why he was in quarantine.  He mentioned that "it might have something to do with the flu".  He was not familiar with the term coronavirus. His only complaint is occasional nocturia and incontinence.  Constitutional: No fever, significant weight change, fatigue  Eyes: No redness, discharge, pain, vision change ENT/mouth: No nasal congestion,  purulent discharge, earache, change in hearing, sore throat  Cardiovascular: No chest pain,  palpitations, paroxysmal nocturnal dyspnea, claudication  Respiratory: No cough, sputum production, hemoptysis, DOE, significant snoring, apnea   Gastrointestinal: No heartburn, dysphagia, abdominal pain, nausea /vomiting, rectal bleeding, melena, change in bowels Genitourinary: No dysuria, hematuria, pyuria Musculoskeletal: No joint stiffness, joint swelling, weakness, pain Dermatologic: No rash, pruritus, change in appearance of skin Neurologic: No dizziness, headache, syncope, seizures, numbness, tingling Psychiatric: No significant anxiety, depression, insomnia, anorexia Endocrine: No change in hair/skin/nails, excessive thirst, excessive hunger, excessive urination  Hematologic/lymphatic: No significant bruising, lymphadenopathy, abnormal bleeding Allergy/immunology: No itchy/watery eyes, significant sneezing, urticaria, angioedema  Physical exam:  Pertinent or positive findings: He appears younger than his age.  Hair is disheveled.  He has dense arcus senilis.  He is edentulous except for carious remnants of mandibular teeth.  Rhythm is slow and minimally irregular.  Chest was surprisingly clear without rales or rhonchi.  Abdomen is protuberant.  Pedal pulses are not palpable.  There is trace edema at the right sock line.  He has minor stasis dermatitis changes over the shins.  The nails of the left hand are deformed.  General appearance: Adequately nourished; no acute distress, increased work of breathing is present.   Lymphatic: No lymphadenopathy about the head, neck, axilla. Eyes: No conjunctival inflammation or lid edema is present. There is no scleral icterus. Ears:  External ear exam shows no significant lesions or deformities.   Nose:  External nasal examination shows no deformity or inflammation. Nasal mucosa are pink and moist without lesions, exudates Oral exam:  Lips are healthy appearing ,no cyanosis. There is no oropharyngeal  erythema or exudate. Neck:  No thyromegaly, masses,  tenderness noted.    Heart:  No gallop, murmur, click, rub .  Abdomen: Bowel sounds are normal. Abdomen is soft and nontender with no organomegaly, hernias, masses. GU: Deferred  Extremities:  No cyanosis, clubbing  Neurologic exam : Balance, Rhomberg, finger to nose testing could not be completed due to clinical state Skin: Warm & dry w/o tenting. No significant lesions .  See summary under each active problem in the Problem List with associated updated therapeutic plan

## 2019-02-28 NOTE — Assessment & Plan Note (Addendum)
Glucoses are markedly variable.  His dietary compliance has been poor; the quarantine related to coronavirus may be impacting his access to outside foods.  Critical is to avoid hypoglycemia.Endocrinology F/U once quarantine lifted.

## 2019-03-03 NOTE — Patient Instructions (Signed)
See assessment and plan under each diagnosis in the problem list and acutely for this visit 

## 2019-03-10 ENCOUNTER — Ambulatory Visit: Payer: Medicare HMO | Admitting: Endocrinology

## 2019-03-21 ENCOUNTER — Encounter: Payer: Self-pay | Admitting: Adult Health

## 2019-03-21 ENCOUNTER — Non-Acute Institutional Stay (SKILLED_NURSING_FACILITY): Payer: Medicare HMO | Admitting: Adult Health

## 2019-03-21 DIAGNOSIS — K219 Gastro-esophageal reflux disease without esophagitis: Secondary | ICD-10-CM | POA: Diagnosis not present

## 2019-03-21 DIAGNOSIS — N184 Chronic kidney disease, stage 4 (severe): Secondary | ICD-10-CM

## 2019-03-21 DIAGNOSIS — E559 Vitamin D deficiency, unspecified: Secondary | ICD-10-CM

## 2019-03-21 DIAGNOSIS — I25709 Atherosclerosis of coronary artery bypass graft(s), unspecified, with unspecified angina pectoris: Secondary | ICD-10-CM

## 2019-03-21 DIAGNOSIS — I5042 Chronic combined systolic (congestive) and diastolic (congestive) heart failure: Secondary | ICD-10-CM | POA: Diagnosis not present

## 2019-03-21 DIAGNOSIS — E1122 Type 2 diabetes mellitus with diabetic chronic kidney disease: Secondary | ICD-10-CM

## 2019-03-21 DIAGNOSIS — N400 Enlarged prostate without lower urinary tract symptoms: Secondary | ICD-10-CM | POA: Diagnosis not present

## 2019-03-21 DIAGNOSIS — E538 Deficiency of other specified B group vitamins: Secondary | ICD-10-CM

## 2019-03-21 DIAGNOSIS — I110 Hypertensive heart disease with heart failure: Secondary | ICD-10-CM | POA: Diagnosis not present
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
## 2019-04-07 ENCOUNTER — Encounter: Payer: Self-pay | Admitting: Endocrinology

## 2019-04-11 ENCOUNTER — Other Ambulatory Visit: Payer: Self-pay

## 2019-04-11 ENCOUNTER — Ambulatory Visit (INDEPENDENT_AMBULATORY_CARE_PROVIDER_SITE_OTHER): Payer: Medicare HMO | Admitting: Endocrinology

## 2019-04-11 DIAGNOSIS — E1122 Type 2 diabetes mellitus with diabetic chronic kidney disease: Secondary | ICD-10-CM | POA: Diagnosis not present

## 2019-04-11 DIAGNOSIS — N184 Chronic kidney disease, stage 4 (severe): Secondary | ICD-10-CM | POA: Diagnosis not present
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
## 2019-04-12 ENCOUNTER — Telehealth: Payer: Self-pay | Admitting: Endocrinology

## 2019-04-12 NOTE — Telephone Encounter (Signed)
please contact [facility]: Please fax med list and cbg record.

## 2019-04-13 ENCOUNTER — Telehealth: Payer: Self-pay | Admitting: Endocrinology

## 2019-04-13 NOTE — Telephone Encounter (Signed)
Called [facility]. Spoke with nurse who denied providing records. States will need to come from Medical records. Asked to be transferred to DON. Cold call transfer to [facility]. LVM requesting returned call.

## 2019-04-13 NOTE — Telephone Encounter (Signed)
Med list received - placed in Dr Cordelia Pen office

## 2019-04-13 NOTE — Telephone Encounter (Signed)
Please call to follow up

## 2019-04-13 NOTE — Telephone Encounter (Signed)
We received cbg record from facility.  Med list is assumed to be current. please contact patient's facility:  Please increase novolog to 27 units 3 times a day (just before each meal).   Please continue the same Basaglar.   Please schedule f/u in 1-2 weeks.

## 2019-04-13 NOTE — Telephone Encounter (Signed)
Records give to Dr Loanne Drilling, made aware that so far we have been unsuccessful in getting the updated med list

## 2019-04-13 NOTE — Telephone Encounter (Signed)
Spoke with residents nurse and she stated that she is going to have this information faxed this morning. Provided her with MD name as well as fax number.

## 2019-04-13 NOTE — Telephone Encounter (Signed)
I have CBG results, I have not received the Med list. I requested the medlist three times so far and have yet to receive it.

## 2019-04-13 NOTE — Telephone Encounter (Signed)
Please be on the look out for this information.

## 2019-04-14 NOTE — Telephone Encounter (Signed)
I reviewed med list, and it is the same as med list we have as of pt's ov earlier this week

## 2019-04-14 NOTE — Telephone Encounter (Signed)
Please refer to Dr. Ellison's response 

## 2019-04-14 NOTE — Telephone Encounter (Signed)
Med list received. See other TE.  Nothing further needed.

## 2019-04-18 ENCOUNTER — Encounter: Payer: Self-pay | Admitting: Internal Medicine

## 2019-04-18 ENCOUNTER — Non-Acute Institutional Stay (SKILLED_NURSING_FACILITY): Payer: Medicare HMO | Admitting: Adult Health

## 2019-04-18 ENCOUNTER — Encounter: Payer: Self-pay | Admitting: Adult Health

## 2019-04-18 DIAGNOSIS — B372 Candidiasis of skin and nail: Secondary | ICD-10-CM

## 2019-04-18 DIAGNOSIS — I4819 Other persistent atrial fibrillation: Secondary | ICD-10-CM

## 2019-04-18 DIAGNOSIS — N400 Enlarged prostate without lower urinary tract symptoms: Secondary | ICD-10-CM

## 2019-04-18 DIAGNOSIS — I110 Hypertensive heart disease with heart failure: Secondary | ICD-10-CM

## 2019-04-18 DIAGNOSIS — I5042 Chronic combined systolic (congestive) and diastolic (congestive) heart failure: Secondary | ICD-10-CM

## 2019-04-18 DIAGNOSIS — N184 Chronic kidney disease, stage 4 (severe): Secondary | ICD-10-CM

## 2019-04-18 DIAGNOSIS — E1122 Type 2 diabetes mellitus with diabetic chronic kidney disease: Secondary | ICD-10-CM

## 2019-04-18 DIAGNOSIS — I25709 Atherosclerosis of coronary artery bypass graft(s), unspecified, with unspecified angina pectoris: Secondary | ICD-10-CM
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
## 2019-04-26 ENCOUNTER — Non-Acute Institutional Stay (SKILLED_NURSING_FACILITY): Payer: Medicare HMO | Admitting: Internal Medicine

## 2019-04-26 ENCOUNTER — Encounter: Payer: Self-pay | Admitting: Internal Medicine

## 2019-04-26 DIAGNOSIS — N184 Chronic kidney disease, stage 4 (severe): Secondary | ICD-10-CM | POA: Diagnosis not present

## 2019-04-26 DIAGNOSIS — I110 Hypertensive heart disease with heart failure: Secondary | ICD-10-CM | POA: Diagnosis not present

## 2019-04-26 DIAGNOSIS — I5042 Chronic combined systolic (congestive) and diastolic (congestive) heart failure: Secondary | ICD-10-CM

## 2019-04-26 DIAGNOSIS — E1122 Type 2 diabetes mellitus with diabetic chronic kidney disease: Secondary | ICD-10-CM

## 2019-04-26 DIAGNOSIS — I4891 Unspecified atrial fibrillation: Secondary | ICD-10-CM | POA: Diagnosis not present
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
## 2019-05-24 ENCOUNTER — Non-Acute Institutional Stay (SKILLED_NURSING_FACILITY): Payer: Medicare HMO | Admitting: Internal Medicine

## 2019-05-24 ENCOUNTER — Encounter: Payer: Self-pay | Admitting: Internal Medicine

## 2019-05-24 DIAGNOSIS — I5042 Chronic combined systolic (congestive) and diastolic (congestive) heart failure: Secondary | ICD-10-CM | POA: Diagnosis not present

## 2019-05-24 DIAGNOSIS — E1151 Type 2 diabetes mellitus with diabetic peripheral angiopathy without gangrene: Secondary | ICD-10-CM | POA: Diagnosis not present

## 2019-05-24 DIAGNOSIS — I4819 Other persistent atrial fibrillation: Secondary | ICD-10-CM | POA: Diagnosis not present

## 2019-05-24 DIAGNOSIS — N183 Chronic kidney disease, stage 3 unspecified: Secondary | ICD-10-CM

## 2019-05-24 DIAGNOSIS — I70209 Unspecified atherosclerosis of native arteries of extremities, unspecified extremity: Secondary | ICD-10-CM | POA: Diagnosis not present

## 2019-05-24 DIAGNOSIS — S99921A Unspecified injury of right foot, initial encounter: Secondary | ICD-10-CM

## 2019-05-24 DIAGNOSIS — E876 Hypokalemia: Secondary | ICD-10-CM | POA: Diagnosis not present
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
## 2019-05-25 ENCOUNTER — Encounter: Payer: Self-pay | Admitting: Internal Medicine

## 2019-05-25 DIAGNOSIS — E559 Vitamin D deficiency, unspecified: Secondary | ICD-10-CM | POA: Diagnosis not present

## 2019-05-25 DIAGNOSIS — D649 Anemia, unspecified: Secondary | ICD-10-CM | POA: Diagnosis not present

## 2019-05-25 DIAGNOSIS — E119 Type 2 diabetes mellitus without complications: Secondary | ICD-10-CM | POA: Diagnosis not present

## 2019-05-25 DIAGNOSIS — I10 Essential (primary) hypertension: Secondary | ICD-10-CM | POA: Diagnosis not present

## 2019-05-25 LAB — CBC AND DIFFERENTIAL
HCT: 33 — AB (ref 41–53)
Hemoglobin: 11.2 — AB (ref 13.5–17.5)
Neutrophils Absolute: 6
Platelets: 156 (ref 150–399)
WBC: 8.3

## 2019-05-25 LAB — BASIC METABOLIC PANEL
BUN: 74 — AB (ref 4–21)
Creatinine: 2.7 — AB (ref 0.6–1.3)
Glucose: 338
Potassium: 4.2 (ref 3.4–5.3)
Sodium: 136 — AB (ref 137–147)

## 2019-05-29 ENCOUNTER — Non-Acute Institutional Stay (SKILLED_NURSING_FACILITY): Payer: Medicare HMO | Admitting: Adult Health

## 2019-05-29 ENCOUNTER — Encounter: Payer: Self-pay | Admitting: Adult Health

## 2019-05-29 DIAGNOSIS — K219 Gastro-esophageal reflux disease without esophagitis: Secondary | ICD-10-CM | POA: Diagnosis not present

## 2019-05-29 DIAGNOSIS — I10 Essential (primary) hypertension: Secondary | ICD-10-CM

## 2019-05-29 DIAGNOSIS — N184 Chronic kidney disease, stage 4 (severe): Secondary | ICD-10-CM

## 2019-05-29 DIAGNOSIS — Z7189 Other specified counseling: Secondary | ICD-10-CM | POA: Diagnosis not present

## 2019-05-29 DIAGNOSIS — I5042 Chronic combined systolic (congestive) and diastolic (congestive) heart failure: Secondary | ICD-10-CM | POA: Diagnosis not present

## 2019-05-29 DIAGNOSIS — E1122 Type 2 diabetes mellitus with diabetic chronic kidney disease: Secondary | ICD-10-CM | POA: Diagnosis not present

## 2019-05-29 DIAGNOSIS — E538 Deficiency of other specified B group vitamins: Secondary | ICD-10-CM | POA: Diagnosis not present
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
## 2019-05-30 ENCOUNTER — Ambulatory Visit: Payer: Medicare HMO | Admitting: Nurse Practitioner

## 2019-06-05 ENCOUNTER — Encounter: Payer: Self-pay | Admitting: Adult Health

## 2019-06-05 ENCOUNTER — Non-Acute Institutional Stay (SKILLED_NURSING_FACILITY): Payer: Medicare HMO | Admitting: Adult Health

## 2019-06-05 DIAGNOSIS — R0602 Shortness of breath: Secondary | ICD-10-CM

## 2019-06-05 DIAGNOSIS — I5042 Chronic combined systolic (congestive) and diastolic (congestive) heart failure: Secondary | ICD-10-CM | POA: Diagnosis not present

## 2019-06-05 DIAGNOSIS — N184 Chronic kidney disease, stage 4 (severe): Secondary | ICD-10-CM
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
## 2019-06-06 DIAGNOSIS — N183 Chronic kidney disease, stage 3 (moderate): Secondary | ICD-10-CM | POA: Diagnosis not present

## 2019-06-06 DIAGNOSIS — D649 Anemia, unspecified: Secondary | ICD-10-CM | POA: Diagnosis not present

## 2019-06-06 DIAGNOSIS — N184 Chronic kidney disease, stage 4 (severe): Secondary | ICD-10-CM | POA: Insufficient documentation

## 2019-06-06 LAB — BASIC METABOLIC PANEL
BUN: 75 — AB (ref 4–21)
Creatinine: 2.6 — AB (ref 0.6–1.3)
Glucose: 82
Potassium: 3.5 (ref 3.4–5.3)
Sodium: 141 (ref 137–147)

## 2019-06-08 ENCOUNTER — Non-Acute Institutional Stay (SKILLED_NURSING_FACILITY): Payer: Medicare HMO | Admitting: Internal Medicine

## 2019-06-08 ENCOUNTER — Encounter: Payer: Self-pay | Admitting: Internal Medicine

## 2019-06-08 DIAGNOSIS — R351 Nocturia: Secondary | ICD-10-CM

## 2019-06-08 DIAGNOSIS — N401 Enlarged prostate with lower urinary tract symptoms: Secondary | ICD-10-CM | POA: Diagnosis not present

## 2019-06-08 DIAGNOSIS — I4819 Other persistent atrial fibrillation: Secondary | ICD-10-CM

## 2019-06-08 DIAGNOSIS — I5042 Chronic combined systolic (congestive) and diastolic (congestive) heart failure: Secondary | ICD-10-CM | POA: Diagnosis not present

## 2019-06-08 NOTE — Assessment & Plan Note (Signed)
He describes nocturia 3-4 times a night despite tamsulosin. Finasteride will be added.

## 2019-06-08 NOTE — Assessment & Plan Note (Signed)
09/04/2018 confirmed A. fib with a slow but variable rate. TSH should be updated because of intermittent significant bradycardia.  He is on low-dose beta-blocker.

## 2019-06-08 NOTE — Assessment & Plan Note (Signed)
Acute exacerbation of exertional dyspnea 7/21 responded to an extra dose of metolazone.  No evidence of active congestive heart failure.

## 2019-06-08 NOTE — Patient Instructions (Signed)
See assessment and plan under each diagnosis in the problem list and acutely for this visit 

## 2019-06-08 NOTE — Progress Notes (Signed)
NURSING HOME LOCATION:  [facility] ROOM NUMBER:  [id number]  CODE STATUS:  DNR  PCP:  [name], MD [address] [place] [address]   This is a nursing facility follow up of chronic medical diagnoses.  Interim medical record and care since last [place] visit was updated with review of diagnostic studies and change in clinical status since last visit were documented.  HPI: He is a World War II veteran who is a permanent resident of the facility.  He has medical diagnoses of vitamin D deficiency, vitamin B12 deficiency, CKD, PSVT, insulin-dependent diabetes with stage IV CKD, diverticulosis, CAD with history of NSTEMI, essential hypertension, GERD, PVD, dyslipidemia, benign fibroma of the prostate, symptomatic BPH and congestive heart failure. He has had CABG. He is a former cigar smoker.  He was seen acutely 7/21 for exertional dyspnea.  An extra dose of metolazone was given early and renal function checked.  Labs were stable.  Creatinine was 3.6 and potassium 3.5. Blood pressures have ranged from 122/49 up to 149/79.  Pulse varies from 42-70.   He had 1 bedtime glucose of 79.  The evening glucoses range from 103-394.  The latter value was an outlier.  Fasting blood sugars range from 114-184.  Review of systems:   He was doing a crossword puzzle when I entered the room.  He knew it was Thursday and July but told me the year was 2000.  He repeated this later stating "there is an election".  He sarcastically named the president as " Stump".  He then proceeded to call him a profane name, a fatherless child. He thought the mask was because of flu epidemic, despite stating he listens to public radio.  He was unfamiliar with the terms COVID or corona. His exertional dyspnea is improved.  He denies any paroxysmal nocturnal dyspnea.  He also denies pedal edema.  He has no other cardiac symptoms. He has nocturia 3-4 times per night without other GU symptoms.  He is on  tamsulosin.  Constitutional: No fever, significant weight change, fatigue  Cardiovascular: No chest pain, palpitations,  Claudication Respiratory: No cough, sputum production, hemoptysis Gastrointestinal: No heartburn, dysphagia, abdominal pain, nausea /vomiting, rectal bleeding, melena, change in bowels Genitourinary: No dysuria, hematuria, pyuria Dermatologic: No new rash, pruritus, change in appearance of skin Neurologic: No dizziness, headache, syncope, seizures Psychiatric: No significant anxiety, depression, insomnia, anorexia Endocrine: No change in hair/skin/nails, excessive thirst, excessive hunger, excessive urination  Hematologic/lymphatic: No significant bruising, lymphadenopathy, abnormal bleeding Allergy/immunology: No itchy/watery eyes, significant sneezing, urticaria, angioedema  Physical exam:  Pertinent or positive findings: Hair is disheveled.  He has dense arcus senilis.  He is edentulous.  Heart sounds are distant.  Rhythm is slow but regular.  Breath sounds are clear; his chest is actually surprisingly clear with insignificant rales.  Abdomen is protuberant with hyperactive bowel sounds.  Pedal pulses are not palpable.  There is no edema.  The left ankle is dressed. Slight abrasion dorsum of R great toe. He has contractures of the toes and thickened deformed toenails.  He has a cyst/boss over the index PIP on the right.  He has onycholysis of the fourth right finger nail.  He has isolated DIP osteoarthritic changes in the hands.  General appearance:  no acute distress, increased work of breathing is present.   Lymphatic: No lymphadenopathy about the head, neck, axilla. Eyes: No conjunctival inflammation or lid edema is present. There is no scleral icterus. Ears:  External  ear exam shows no significant lesions or deformities.   Nose:  External nasal examination shows no deformity or inflammation. Nasal mucosa are pink and moist without lesions, exudates Oral exam:  Lips and  gums are healthy appearing. Neck:  No thyromegaly, masses, tenderness noted.    Heart:  No gallop, murmur, click, rub .  Lungs:  without wheezes, rhonchi,  rubs. Abdomen: Abdomen is soft and nontender with no organomegaly, hernias, masses. GU: Deferred  Extremities:  No cyanosis, clubbing, edema  Neurologic exam : Balance, Rhomberg, finger to nose testing could not be completed due to clinical state Skin: Warm & dry w/o tenting. No significant rash.  See summary under each active problem in the Problem List with associated updated therapeutic plan

## 2019-06-09 DIAGNOSIS — Z20828 Contact with and (suspected) exposure to other viral communicable diseases: Secondary | ICD-10-CM | POA: Diagnosis not present

## 2019-06-09 DIAGNOSIS — B342 Coronavirus infection, unspecified: Secondary | ICD-10-CM | POA: Diagnosis not present
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
## 2019-06-12 ENCOUNTER — Ambulatory Visit: Payer: Medicare HMO | Admitting: Nurse Practitioner

## 2019-06-12 DIAGNOSIS — E039 Hypothyroidism, unspecified: Secondary | ICD-10-CM | POA: Diagnosis not present

## 2019-06-12 DIAGNOSIS — I4811 Longstanding persistent atrial fibrillation: Secondary | ICD-10-CM | POA: Diagnosis not present

## 2019-06-12 LAB — TSH: TSH: 2.02 (ref 0.41–5.90)

## 2019-06-27 DIAGNOSIS — J069 Acute upper respiratory infection, unspecified: Secondary | ICD-10-CM | POA: Diagnosis not present

## 2019-06-29 ENCOUNTER — Non-Acute Institutional Stay (SKILLED_NURSING_FACILITY): Payer: Medicare HMO | Admitting: Adult Health

## 2019-06-29 ENCOUNTER — Encounter: Payer: Self-pay | Admitting: Adult Health

## 2019-06-29 DIAGNOSIS — I110 Hypertensive heart disease with heart failure: Secondary | ICD-10-CM | POA: Diagnosis not present

## 2019-06-29 DIAGNOSIS — I5042 Chronic combined systolic (congestive) and diastolic (congestive) heart failure: Secondary | ICD-10-CM | POA: Diagnosis not present

## 2019-06-29 DIAGNOSIS — I25709 Atherosclerosis of coronary artery bypass graft(s), unspecified, with unspecified angina pectoris: Secondary | ICD-10-CM | POA: Diagnosis not present

## 2019-06-29 DIAGNOSIS — I70209 Unspecified atherosclerosis of native arteries of extremities, unspecified extremity: Secondary | ICD-10-CM | POA: Diagnosis not present

## 2019-06-29 DIAGNOSIS — N401 Enlarged prostate with lower urinary tract symptoms: Secondary | ICD-10-CM

## 2019-06-29 DIAGNOSIS — R351 Nocturia: Secondary | ICD-10-CM | POA: Diagnosis not present

## 2019-06-29 DIAGNOSIS — E1151 Type 2 diabetes mellitus with diabetic peripheral angiopathy without gangrene: Secondary | ICD-10-CM | POA: Diagnosis not present
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
## 2019-07-03 ENCOUNTER — Non-Acute Institutional Stay (SKILLED_NURSING_FACILITY): Payer: Medicare HMO | Admitting: Adult Health

## 2019-07-03 ENCOUNTER — Encounter: Payer: Self-pay | Admitting: Adult Health

## 2019-07-03 DIAGNOSIS — B372 Candidiasis of skin and nail: Secondary | ICD-10-CM
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
## 2019-07-04 DIAGNOSIS — B372 Candidiasis of skin and nail: Secondary | ICD-10-CM | POA: Insufficient documentation
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
## 2019-07-18 ENCOUNTER — Encounter: Payer: Self-pay | Admitting: Nurse Practitioner

## 2019-07-18 ENCOUNTER — Other Ambulatory Visit: Payer: Self-pay

## 2019-07-18 ENCOUNTER — Telehealth (INDEPENDENT_AMBULATORY_CARE_PROVIDER_SITE_OTHER): Payer: Medicare HMO | Admitting: Nurse Practitioner

## 2019-07-18 VITALS — BP 143/64 | HR 64 | Ht 68.0 in | Wt 183.4 lb

## 2019-07-18 DIAGNOSIS — I5042 Chronic combined systolic (congestive) and diastolic (congestive) heart failure: Secondary | ICD-10-CM

## 2019-07-18 DIAGNOSIS — Z7189 Other specified counseling: Secondary | ICD-10-CM

## 2019-07-18 DIAGNOSIS — I25709 Atherosclerosis of coronary artery bypass graft(s), unspecified, with unspecified angina pectoris: Secondary | ICD-10-CM

## 2019-07-18 DIAGNOSIS — N183 Chronic kidney disease, stage 3 unspecified: Secondary | ICD-10-CM

## 2019-07-18 DIAGNOSIS — I4819 Other persistent atrial fibrillation: Secondary | ICD-10-CM

## 2019-07-18 NOTE — Patient Instructions (Addendum)
After Visit Summary:  We will be checking the following labs today - NONE   Medication Instructions:    Continue with your current medicines.    If you need a refill on your cardiac medications before your next appointment, please call your pharmacy.     Testing/Procedures To Be Arranged:  N/A  Follow-Up:   I am happy to see you back as needed.     At [facility], you and your health needs are our priority.  As part of our continuing mission to provide you with exceptional heart care, we have created designated Provider Care Teams.  These Care Teams include your primary Cardiologist (physician) and Advanced Practice Providers (APPs -  Physician Assistants and Nurse Practitioners) who all work together to provide you with the care you need, when you need it.  Special Instructions:  . Stay safe, stay home, wash your hands for at least 20 seconds and wear a mask when out in public.  . It was good to talk with you today.    Call the [place] office at [phone] if you have any questions, problems or concerns.

## 2019-07-21 ENCOUNTER — Non-Acute Institutional Stay (SKILLED_NURSING_FACILITY): Payer: Medicare HMO | Admitting: Adult Health

## 2019-07-21 ENCOUNTER — Encounter: Payer: Self-pay | Admitting: Adult Health

## 2019-07-21 DIAGNOSIS — N184 Chronic kidney disease, stage 4 (severe): Secondary | ICD-10-CM

## 2019-07-21 DIAGNOSIS — I25709 Atherosclerosis of coronary artery bypass graft(s), unspecified, with unspecified angina pectoris: Secondary | ICD-10-CM

## 2019-07-21 DIAGNOSIS — R351 Nocturia: Secondary | ICD-10-CM

## 2019-07-21 DIAGNOSIS — I5042 Chronic combined systolic (congestive) and diastolic (congestive) heart failure: Secondary | ICD-10-CM | POA: Diagnosis not present

## 2019-07-21 DIAGNOSIS — N401 Enlarged prostate with lower urinary tract symptoms: Secondary | ICD-10-CM

## 2019-07-21 DIAGNOSIS — E1122 Type 2 diabetes mellitus with diabetic chronic kidney disease: Secondary | ICD-10-CM
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
## 2019-07-24 ENCOUNTER — Non-Acute Institutional Stay (SKILLED_NURSING_FACILITY): Payer: Medicare HMO | Admitting: Internal Medicine

## 2019-07-24 DIAGNOSIS — H906 Mixed conductive and sensorineural hearing loss, bilateral: Secondary | ICD-10-CM | POA: Diagnosis not present

## 2019-07-24 DIAGNOSIS — H6983 Other specified disorders of Eustachian tube, bilateral: Secondary | ICD-10-CM | POA: Diagnosis not present

## 2019-07-24 NOTE — Progress Notes (Signed)
   NURSING HOME LOCATION:  [facility] ROOM NUMBER:   323-A  CODE STATUS:  DNR  PCP:  [name], MD  [place] [id number]   This is a nursing facility follow up for specific acute issue of bilateral ear discomfort.  Interim medical record and care since last [place] visit was updated with review of diagnostic studies and change in clinical status since last visit were documented.  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  Actually his complaint is that his ears are "stuffed up" and he has decreased hearing.  He states that the symptoms have been present for several weeks and drops have been instilled bilaterally without benefit.  He denies any upper or lower respiratory tract symptoms, specifically no COVID symptoms.  He also denies any extrinsic symptoms.  He denies dysphagia or sore throat.  Review of systems: Constitutional: No fever, significant weight change, fatigue  Eyes: No redness, discharge, pain, vision change ENT/mouth: No nasal congestion,  purulent discharge  Cardiovascular: No chest pain, palpitations, paroxysmal nocturnal dyspnea, claudication, edema  Respiratory: No cough, sputum production, hemoptysis, DOE, significant snoring, apnea    appearance of skin Allergy/immunology: No itchy/watery eyes, significant sneezing, urticaria, angioedema  Physical exam:  Pertinent or positive findings: Exhibits pattern alopecia.  Dense arcus senilis is present.  He is essentially edentulous except for carious nubs of the anterior mandible.  He is unable to hear the spoken voice on either side.  The otic canals are clear and the tympanic membranes easily visualized.  The right tympanic membrane appears slightly hyperpigmented vs the left.  There is no evidence of erythema or discharge. Breath sounds are decreased with minor rales at the bases.  Heart rhythm is slow and slightly irregular.  General  appearance: Adequately nourished; no acute distress, increased work of breathing is present.   Lymphatic: No lymphadenopathy about the head, neck, axilla. Eyes: No conjunctival inflammation or lid edema is present. There is no scleral icterus. Ears:  External ear exam shows no significant lesions or deformities.   Nose:  External nasal examination shows no deformity or inflammation. Nasal mucosa are pink and moist without lesions, exudates Oral exam:  Lips are healthy appearing. There is no oropharyngeal erythema or exudate. Neck:  No thyromegaly, masses, tenderness noted.  Lungs:  without wheezes, rhonchi,rubs.  See summary & plan under each acute diagnosis

## 2019-07-24 NOTE — Patient Instructions (Signed)
See assessment and plan under each diagnosis in the problem list and acutely for this visit 

## 2019-07-25 ENCOUNTER — Encounter: Payer: Self-pay | Admitting: Internal Medicine

## 2019-07-25 DIAGNOSIS — H9190 Unspecified hearing loss, unspecified ear: Secondary | ICD-10-CM | POA: Insufficient documentation

## 2019-07-25 DIAGNOSIS — H6980 Other specified disorders of Eustachian tube, unspecified ear: Secondary | ICD-10-CM | POA: Insufficient documentation

## 2019-07-26 DIAGNOSIS — I4811 Longstanding persistent atrial fibrillation: Secondary | ICD-10-CM | POA: Diagnosis not present

## 2019-07-26 DIAGNOSIS — E119 Type 2 diabetes mellitus without complications: Secondary | ICD-10-CM | POA: Diagnosis not present

## 2019-07-26 DIAGNOSIS — N183 Chronic kidney disease, stage 3 (moderate): Secondary | ICD-10-CM | POA: Diagnosis not present

## 2019-07-26 LAB — HEMOGLOBIN A1C: Hemoglobin A1C: 8.5

## 2019-07-29 IMAGING — DX DG CHEST 2V
2 series · 2 of 2 positions shown · non-contrast
Comparison: [DATE]

CLINICAL DATA: Fever, shortness of Breath

EXAM:
CHEST  2 VIEW

[chest lat]
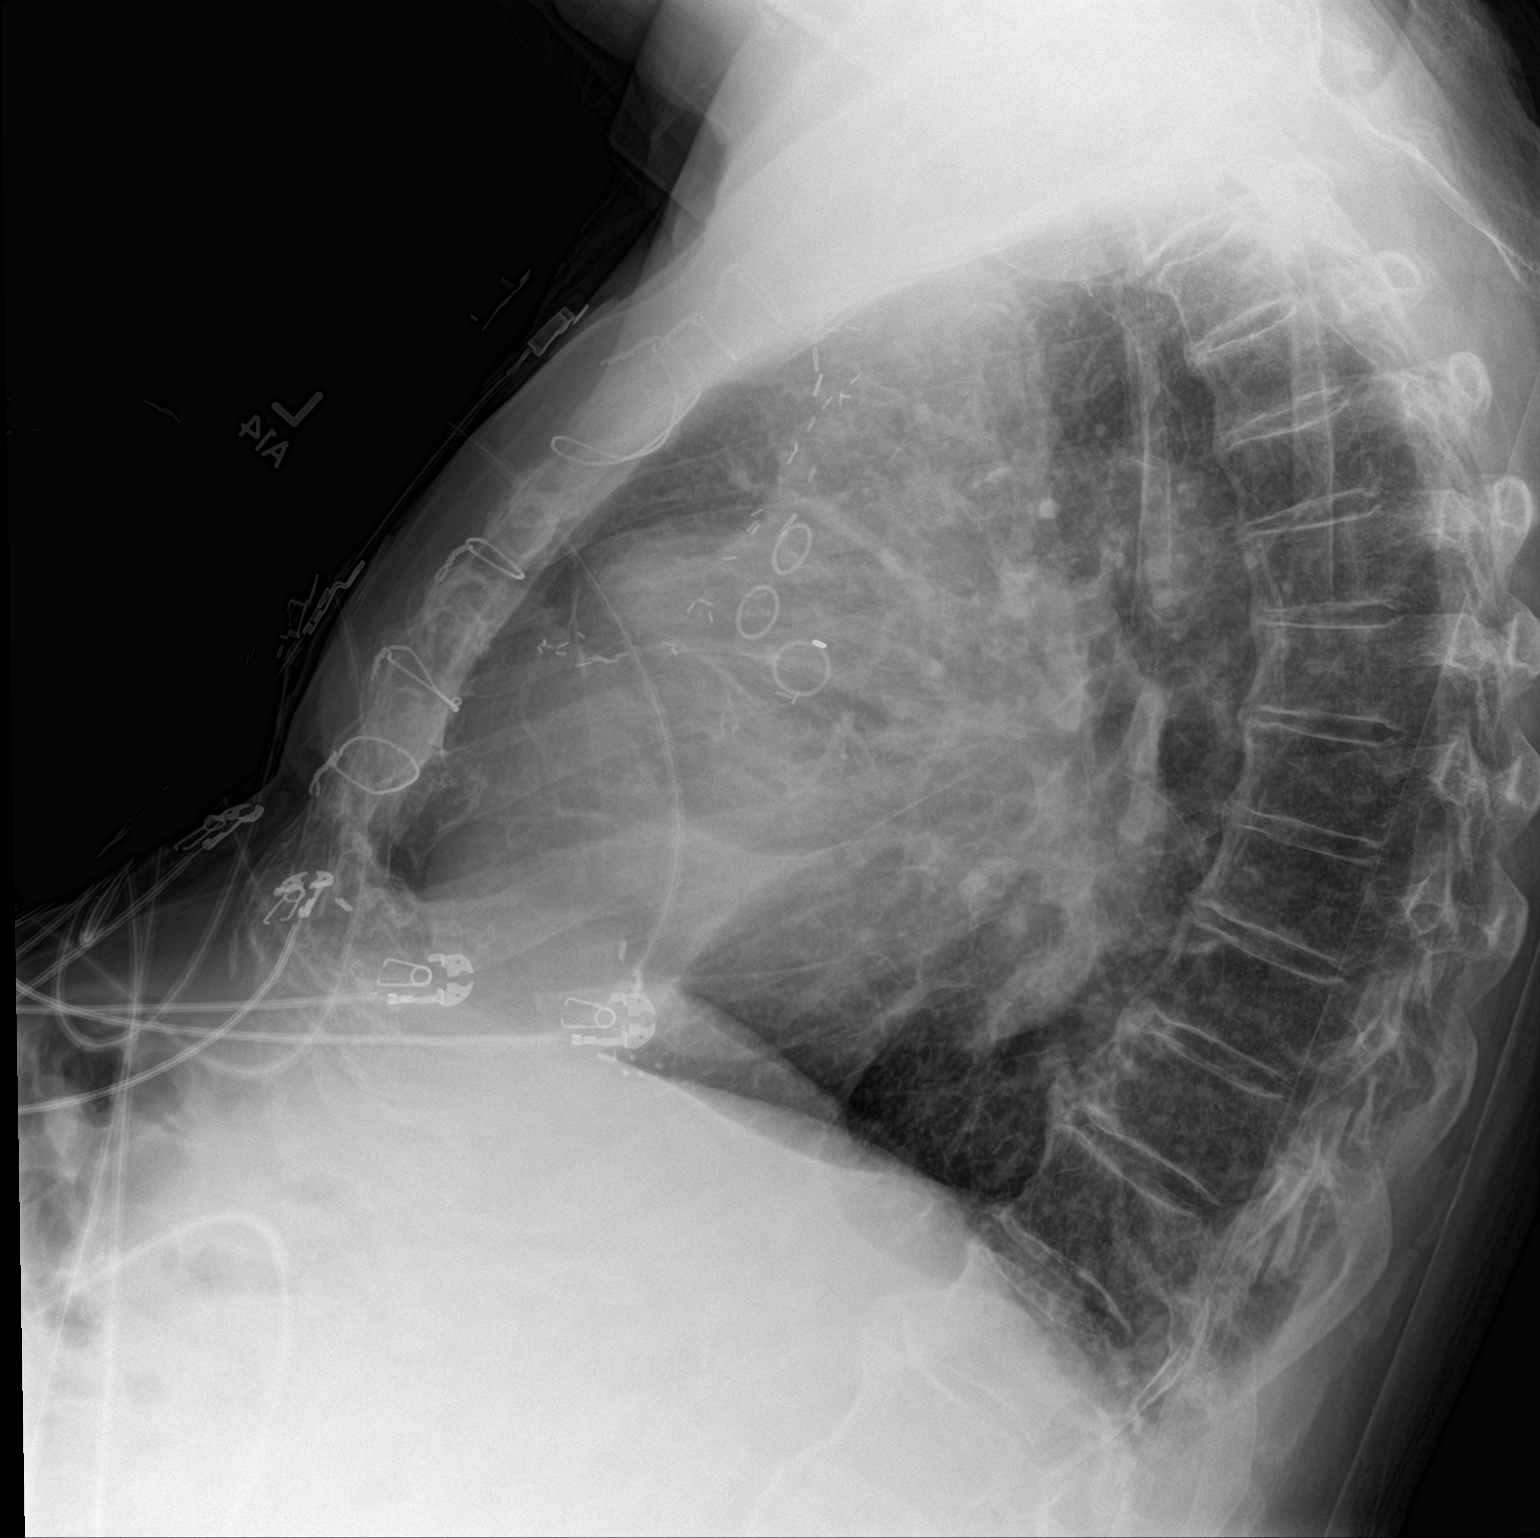

[chest ap]
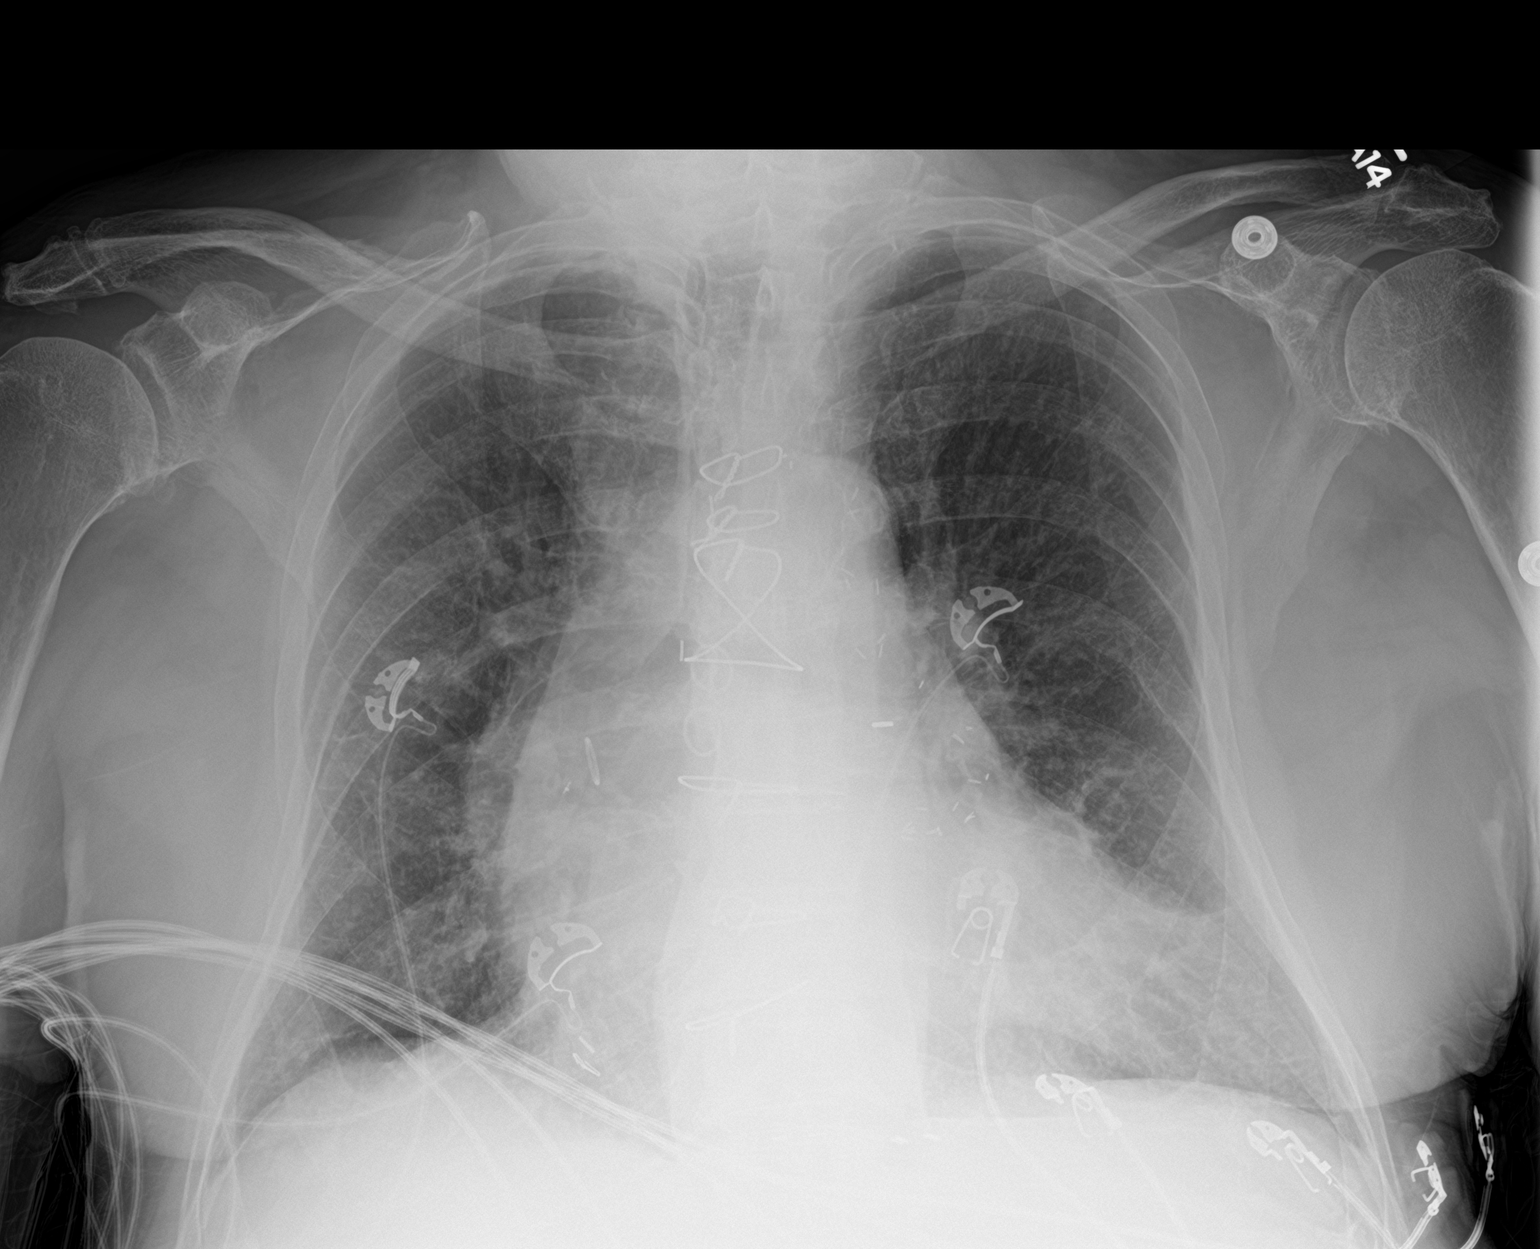

[2 of 2 positions shown; findings below may reference images not displayed]

FINDINGS: Prior CABG. Cardiomegaly. Lingular scarring. No acute confluent
airspace opacities or effusions. No overt edema. No acute bony
abnormality.
IMPRESSION: Cardiomegaly.  Lingular scarring.  No active disease.

## 2019-08-10 ENCOUNTER — Non-Acute Institutional Stay (SKILLED_NURSING_FACILITY): Payer: Medicare HMO | Admitting: Adult Health

## 2019-08-10 ENCOUNTER — Encounter: Payer: Self-pay | Admitting: Adult Health

## 2019-08-10 DIAGNOSIS — L299 Pruritus, unspecified: Secondary | ICD-10-CM

## 2019-08-10 DIAGNOSIS — R197 Diarrhea, unspecified: Secondary | ICD-10-CM | POA: Diagnosis not present

## 2019-08-10 DIAGNOSIS — L24A9 Irritant contact dermatitis due friction or contact with other specified body fluids: Secondary | ICD-10-CM | POA: Insufficient documentation

## 2019-08-10 DIAGNOSIS — T148XXA Other injury of unspecified body region, initial encounter: Secondary | ICD-10-CM | POA: Diagnosis not present
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
## 2019-08-11 DIAGNOSIS — I10 Essential (primary) hypertension: Secondary | ICD-10-CM | POA: Diagnosis not present

## 2019-08-11 DIAGNOSIS — D649 Anemia, unspecified: Secondary | ICD-10-CM | POA: Diagnosis not present

## 2019-08-11 DIAGNOSIS — E559 Vitamin D deficiency, unspecified: Secondary | ICD-10-CM | POA: Diagnosis not present

## 2019-08-11 DIAGNOSIS — I130 Hypertensive heart and chronic kidney disease with heart failure and stage 1 through stage 4 chronic kidney disease, or unspecified chronic kidney disease: Secondary | ICD-10-CM | POA: Diagnosis not present

## 2019-08-11 LAB — BASIC METABOLIC PANEL
BUN: 121 — AB (ref 4–21)
Creatinine: 3.3 — AB (ref 0.6–1.3)
Glucose: 90
Potassium: 4.1 (ref 3.4–5.3)
Sodium: 139 (ref 137–147)

## 2019-08-14 DIAGNOSIS — D649 Anemia, unspecified: Secondary | ICD-10-CM | POA: Diagnosis not present

## 2019-08-14 DIAGNOSIS — N183 Chronic kidney disease, stage 3 (moderate): Secondary | ICD-10-CM | POA: Diagnosis not present

## 2019-08-15 LAB — BASIC METABOLIC PANEL
BUN: 113 — AB (ref 4–21)
Creatinine: 3.1 — AB (ref 0.6–1.3)
Glucose: 242
Potassium: 3.6 (ref 3.4–5.3)
Sodium: 137 (ref 137–147)

## 2019-08-17 ENCOUNTER — Encounter: Payer: Self-pay | Admitting: Adult Health

## 2019-08-18 ENCOUNTER — Encounter: Payer: Self-pay | Admitting: Adult Health

## 2019-08-18 ENCOUNTER — Non-Acute Institutional Stay (SKILLED_NURSING_FACILITY): Payer: Medicare HMO | Admitting: Adult Health

## 2019-08-18 DIAGNOSIS — Z0000 Encounter for general adult medical examination without abnormal findings: Secondary | ICD-10-CM | POA: Diagnosis not present

## 2019-08-18 NOTE — Progress Notes (Signed)
This encounter was created in error - please disregard.
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
## 2019-08-22 ENCOUNTER — Non-Acute Institutional Stay (SKILLED_NURSING_FACILITY): Payer: Medicare HMO | Admitting: Adult Health

## 2019-08-22 ENCOUNTER — Encounter: Payer: Self-pay | Admitting: Adult Health

## 2019-08-22 DIAGNOSIS — R351 Nocturia: Secondary | ICD-10-CM | POA: Diagnosis not present

## 2019-08-22 DIAGNOSIS — I5042 Chronic combined systolic (congestive) and diastolic (congestive) heart failure: Secondary | ICD-10-CM | POA: Diagnosis not present

## 2019-08-22 DIAGNOSIS — I110 Hypertensive heart disease with heart failure: Secondary | ICD-10-CM | POA: Diagnosis not present

## 2019-08-22 DIAGNOSIS — N184 Chronic kidney disease, stage 4 (severe): Secondary | ICD-10-CM | POA: Diagnosis not present

## 2019-08-22 DIAGNOSIS — E1122 Type 2 diabetes mellitus with diabetic chronic kidney disease: Secondary | ICD-10-CM | POA: Diagnosis not present

## 2019-08-22 DIAGNOSIS — K219 Gastro-esophageal reflux disease without esophagitis: Secondary | ICD-10-CM

## 2019-08-22 DIAGNOSIS — N401 Enlarged prostate with lower urinary tract symptoms: Secondary | ICD-10-CM
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
## 2019-08-23 ENCOUNTER — Encounter: Payer: Self-pay | Admitting: Adult Health

## 2019-08-23 DIAGNOSIS — H524 Presbyopia: Secondary | ICD-10-CM | POA: Insufficient documentation

## 2019-08-29 DIAGNOSIS — J069 Acute upper respiratory infection, unspecified: Secondary | ICD-10-CM | POA: Diagnosis not present

## 2019-08-31 ENCOUNTER — Non-Acute Institutional Stay (SKILLED_NURSING_FACILITY): Payer: Medicare HMO | Admitting: Adult Health

## 2019-08-31 ENCOUNTER — Encounter: Payer: Self-pay | Admitting: Adult Health

## 2019-08-31 DIAGNOSIS — R0789 Other chest pain: Secondary | ICD-10-CM | POA: Diagnosis not present

## 2019-08-31 DIAGNOSIS — I4819 Other persistent atrial fibrillation: Secondary | ICD-10-CM

## 2019-08-31 DIAGNOSIS — I5042 Chronic combined systolic (congestive) and diastolic (congestive) heart failure: Secondary | ICD-10-CM

## 2019-08-31 DIAGNOSIS — I25709 Atherosclerosis of coronary artery bypass graft(s), unspecified, with unspecified angina pectoris: Secondary | ICD-10-CM | POA: Diagnosis not present
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
## 2019-09-04 DIAGNOSIS — H26492 Other secondary cataract, left eye: Secondary | ICD-10-CM | POA: Diagnosis not present

## 2019-09-04 DIAGNOSIS — H353133 Nonexudative age-related macular degeneration, bilateral, advanced atrophic without subfoveal involvement: Secondary | ICD-10-CM | POA: Diagnosis not present

## 2019-09-04 DIAGNOSIS — H524 Presbyopia: Secondary | ICD-10-CM | POA: Diagnosis not present

## 2019-09-04 DIAGNOSIS — H401212 Low-tension glaucoma, right eye, moderate stage: Secondary | ICD-10-CM | POA: Diagnosis not present

## 2019-09-05 DIAGNOSIS — J069 Acute upper respiratory infection, unspecified: Secondary | ICD-10-CM | POA: Diagnosis not present

## 2019-09-12 DIAGNOSIS — J069 Acute upper respiratory infection, unspecified: Secondary | ICD-10-CM | POA: Diagnosis not present

## 2019-09-13 ENCOUNTER — Non-Acute Institutional Stay (SKILLED_NURSING_FACILITY): Payer: Medicare HMO | Admitting: Adult Health

## 2019-09-13 ENCOUNTER — Encounter: Payer: Self-pay | Admitting: Adult Health

## 2019-09-13 DIAGNOSIS — E1122 Type 2 diabetes mellitus with diabetic chronic kidney disease: Secondary | ICD-10-CM | POA: Diagnosis not present

## 2019-09-13 DIAGNOSIS — R351 Nocturia: Secondary | ICD-10-CM

## 2019-09-13 DIAGNOSIS — N401 Enlarged prostate with lower urinary tract symptoms: Secondary | ICD-10-CM | POA: Diagnosis not present

## 2019-09-13 DIAGNOSIS — I5042 Chronic combined systolic (congestive) and diastolic (congestive) heart failure: Secondary | ICD-10-CM

## 2019-09-13 DIAGNOSIS — Z7189 Other specified counseling: Secondary | ICD-10-CM | POA: Diagnosis not present

## 2019-09-13 DIAGNOSIS — N184 Chronic kidney disease, stage 4 (severe): Secondary | ICD-10-CM

## 2019-09-13 DIAGNOSIS — K219 Gastro-esophageal reflux disease without esophagitis: Secondary | ICD-10-CM

## 2019-09-13 DIAGNOSIS — I10 Essential (primary) hypertension: Secondary | ICD-10-CM | POA: Diagnosis not present
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
## 2019-09-15 ENCOUNTER — Encounter: Payer: Self-pay | Admitting: Adult Health

## 2019-09-15 ENCOUNTER — Non-Acute Institutional Stay (SKILLED_NURSING_FACILITY): Payer: Medicare HMO | Admitting: Adult Health

## 2019-09-15 DIAGNOSIS — I5042 Chronic combined systolic (congestive) and diastolic (congestive) heart failure: Secondary | ICD-10-CM | POA: Diagnosis not present

## 2019-09-15 DIAGNOSIS — N184 Chronic kidney disease, stage 4 (severe): Secondary | ICD-10-CM | POA: Diagnosis not present

## 2019-09-15 DIAGNOSIS — K219 Gastro-esophageal reflux disease without esophagitis: Secondary | ICD-10-CM | POA: Diagnosis not present

## 2019-09-15 DIAGNOSIS — I25709 Atherosclerosis of coronary artery bypass graft(s), unspecified, with unspecified angina pectoris: Secondary | ICD-10-CM

## 2019-09-15 DIAGNOSIS — E1122 Type 2 diabetes mellitus with diabetic chronic kidney disease: Secondary | ICD-10-CM | POA: Diagnosis not present

## 2019-09-15 DIAGNOSIS — I10 Essential (primary) hypertension: Secondary | ICD-10-CM | POA: Diagnosis not present

## 2019-09-15 DIAGNOSIS — I4819 Other persistent atrial fibrillation: Secondary | ICD-10-CM | POA: Diagnosis not present
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
## 2019-09-27 ENCOUNTER — Encounter: Payer: Self-pay | Admitting: Podiatry

## 2019-09-27 ENCOUNTER — Ambulatory Visit (INDEPENDENT_AMBULATORY_CARE_PROVIDER_SITE_OTHER): Payer: Medicare HMO | Admitting: Podiatry

## 2019-09-27 ENCOUNTER — Other Ambulatory Visit: Payer: Self-pay

## 2019-09-27 DIAGNOSIS — B351 Tinea unguium: Secondary | ICD-10-CM

## 2019-09-27 DIAGNOSIS — E1151 Type 2 diabetes mellitus with diabetic peripheral angiopathy without gangrene: Secondary | ICD-10-CM

## 2019-09-27 DIAGNOSIS — E11621 Type 2 diabetes mellitus with foot ulcer: Secondary | ICD-10-CM

## 2019-09-27 DIAGNOSIS — L97521 Non-pressure chronic ulcer of other part of left foot limited to breakdown of skin: Secondary | ICD-10-CM

## 2019-09-27 NOTE — Patient Instructions (Signed)
After Care Instructions  1) Soak foot in soapy sudsy water for 15 minutes after removing the surgical bandage.  2) Re-bandage surgical site after the soaks with neosporin, a sterile 2x2 gauze pad and tape.  3) Perform this soak twice a day following redressing area as above.  4) Return to office in 1 week for re-evaluation.  5) Take Advil for pain medication as needed. If given an antibiotic, finish completely.  6) 2nd week-peroxide washes are recommended instead of soaks. Also air drying is recommended. s
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
## 2019-09-28 ENCOUNTER — Non-Acute Institutional Stay (SKILLED_NURSING_FACILITY): Payer: Medicare HMO | Admitting: Adult Health

## 2019-09-28 ENCOUNTER — Encounter: Payer: Self-pay | Admitting: Adult Health

## 2019-09-28 DIAGNOSIS — E1122 Type 2 diabetes mellitus with diabetic chronic kidney disease: Secondary | ICD-10-CM

## 2019-09-28 DIAGNOSIS — N184 Chronic kidney disease, stage 4 (severe): Secondary | ICD-10-CM

## 2019-09-28 DIAGNOSIS — M79672 Pain in left foot: Secondary | ICD-10-CM | POA: Diagnosis not present
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
## 2019-09-29 DIAGNOSIS — M79672 Pain in left foot: Secondary | ICD-10-CM | POA: Insufficient documentation

## 2019-10-10 ENCOUNTER — Encounter: Payer: Self-pay | Admitting: Adult Health

## 2019-10-10 ENCOUNTER — Non-Acute Institutional Stay (SKILLED_NURSING_FACILITY): Payer: Medicare HMO | Admitting: Adult Health

## 2019-10-10 DIAGNOSIS — I5042 Chronic combined systolic (congestive) and diastolic (congestive) heart failure: Secondary | ICD-10-CM | POA: Diagnosis not present

## 2019-10-10 DIAGNOSIS — N184 Chronic kidney disease, stage 4 (severe): Secondary | ICD-10-CM | POA: Diagnosis not present

## 2019-10-10 DIAGNOSIS — E1122 Type 2 diabetes mellitus with diabetic chronic kidney disease: Secondary | ICD-10-CM | POA: Diagnosis not present
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
## 2019-10-11 ENCOUNTER — Ambulatory Visit (INDEPENDENT_AMBULATORY_CARE_PROVIDER_SITE_OTHER): Payer: Medicare HMO | Admitting: Podiatry

## 2019-10-11 ENCOUNTER — Other Ambulatory Visit: Payer: Self-pay

## 2019-10-11 ENCOUNTER — Encounter: Payer: Self-pay | Admitting: Podiatry

## 2019-10-11 DIAGNOSIS — E11621 Type 2 diabetes mellitus with foot ulcer: Secondary | ICD-10-CM | POA: Diagnosis not present

## 2019-10-11 DIAGNOSIS — L97521 Non-pressure chronic ulcer of other part of left foot limited to breakdown of skin: Secondary | ICD-10-CM | POA: Diagnosis not present

## 2019-10-11 DIAGNOSIS — E1151 Type 2 diabetes mellitus with diabetic peripheral angiopathy without gangrene: Secondary | ICD-10-CM
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
## 2019-10-17 ENCOUNTER — Encounter: Payer: Self-pay | Admitting: Internal Medicine

## 2019-10-17 ENCOUNTER — Non-Acute Institutional Stay (SKILLED_NURSING_FACILITY): Payer: Medicare HMO | Admitting: Internal Medicine

## 2019-10-17 DIAGNOSIS — E11621 Type 2 diabetes mellitus with foot ulcer: Secondary | ICD-10-CM | POA: Insufficient documentation

## 2019-10-17 DIAGNOSIS — N184 Chronic kidney disease, stage 4 (severe): Secondary | ICD-10-CM

## 2019-10-17 DIAGNOSIS — L97509 Non-pressure chronic ulcer of other part of unspecified foot with unspecified severity: Secondary | ICD-10-CM | POA: Insufficient documentation

## 2019-10-17 DIAGNOSIS — R29818 Other symptoms and signs involving the nervous system: Secondary | ICD-10-CM

## 2019-10-17 DIAGNOSIS — E08621 Diabetes mellitus due to underlying condition with foot ulcer: Secondary | ICD-10-CM

## 2019-10-17 DIAGNOSIS — E1122 Type 2 diabetes mellitus with diabetic chronic kidney disease: Secondary | ICD-10-CM

## 2019-10-17 DIAGNOSIS — L97521 Non-pressure chronic ulcer of other part of left foot limited to breakdown of skin: Secondary | ICD-10-CM | POA: Diagnosis not present

## 2019-10-17 DIAGNOSIS — R4189 Other symptoms and signs involving cognitive functions and awareness: Secondary | ICD-10-CM

## 2019-10-17 NOTE — Patient Instructions (Signed)
See assessment and plan under each diagnosis in the problem list and acutely for this visit 
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
## 2019-10-17 NOTE — Assessment & Plan Note (Addendum)
A1c of 8.5% on 07/26/2019.Control is adequate in view of his advanced age and multiple comorbidities. It is critical is to avoid hypoglycemia.  He has had glucoses as high as 302, but these have been outliers.

## 2019-10-17 NOTE — Assessment & Plan Note (Signed)
SLUMS will be performed to establish severity of any neurocognitive deficit.  It is noted that the patient reads and does puzzles and keeps up with current events.

## 2019-10-17 NOTE — Assessment & Plan Note (Addendum)
No evidence of cellulitis or abscess.  Patient would be extremely high risk for surgery such as amputation of the toe due to CAD, CHF & CKD.  A major component in the delayed healing of the lesion is his profound peripheral arterial disease.  Pedal pulses are absent. Wound care as per the SNF nurse will continue.  Follow-up with Dr. Prudence Davidson in 1 month.

## 2019-10-27 ENCOUNTER — Encounter: Payer: Self-pay | Admitting: Adult Health

## 2019-10-27 ENCOUNTER — Non-Acute Institutional Stay (SKILLED_NURSING_FACILITY): Payer: Medicare HMO | Admitting: Adult Health

## 2019-10-27 DIAGNOSIS — N184 Chronic kidney disease, stage 4 (severe): Secondary | ICD-10-CM | POA: Diagnosis not present

## 2019-10-27 DIAGNOSIS — I5042 Chronic combined systolic (congestive) and diastolic (congestive) heart failure: Secondary | ICD-10-CM | POA: Diagnosis not present

## 2019-10-27 DIAGNOSIS — E119 Type 2 diabetes mellitus without complications: Secondary | ICD-10-CM | POA: Diagnosis not present

## 2019-10-27 DIAGNOSIS — N179 Acute kidney failure, unspecified: Secondary | ICD-10-CM

## 2019-10-27 DIAGNOSIS — E1122 Type 2 diabetes mellitus with diabetic chronic kidney disease: Secondary | ICD-10-CM | POA: Diagnosis not present

## 2019-10-27 DIAGNOSIS — D649 Anemia, unspecified: Secondary | ICD-10-CM | POA: Diagnosis not present

## 2019-10-27 LAB — BASIC METABOLIC PANEL
BUN: 179 — AB (ref 4–21)
CO2: 25 — AB (ref 13–22)
Chloride: 92 — AB (ref 99–108)
Creatinine: 3.5 — AB (ref 0.6–1.3)
Glucose: 178
Potassium: 3.8 (ref 3.4–5.3)
Sodium: 137 (ref 137–147)

## 2019-10-27 LAB — COMPREHENSIVE METABOLIC PANEL
Calcium: 8.6 — AB (ref 8.7–10.7)
GFR calc Af Amer: 16.28
GFR calc non Af Amer: <15

## 2019-10-27 LAB — CBC AND DIFFERENTIAL
HCT: 33 — AB (ref 41–53)
Hemoglobin: 11 — AB (ref 13.5–17.5)
Platelets: 227 (ref 150–399)
WBC: 8.8

## 2019-10-27 LAB — CBC: RBC: 3.68 — AB (ref 3.87–5.11)
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
## 2019-11-13 ENCOUNTER — Encounter: Payer: Self-pay | Admitting: Adult Health

## 2019-11-13 ENCOUNTER — Non-Acute Institutional Stay (SKILLED_NURSING_FACILITY): Payer: Medicare HMO | Admitting: Adult Health

## 2019-11-13 DIAGNOSIS — N184 Chronic kidney disease, stage 4 (severe): Secondary | ICD-10-CM

## 2019-11-13 DIAGNOSIS — E1122 Type 2 diabetes mellitus with diabetic chronic kidney disease: Secondary | ICD-10-CM | POA: Diagnosis not present

## 2019-11-13 DIAGNOSIS — N400 Enlarged prostate without lower urinary tract symptoms: Secondary | ICD-10-CM

## 2019-11-13 DIAGNOSIS — I25709 Atherosclerosis of coronary artery bypass graft(s), unspecified, with unspecified angina pectoris: Secondary | ICD-10-CM | POA: Diagnosis not present

## 2019-11-13 DIAGNOSIS — J3489 Other specified disorders of nose and nasal sinuses: Secondary | ICD-10-CM | POA: Diagnosis not present

## 2019-11-13 DIAGNOSIS — I4819 Other persistent atrial fibrillation: Secondary | ICD-10-CM | POA: Diagnosis not present

## 2019-11-13 DIAGNOSIS — I5042 Chronic combined systolic (congestive) and diastolic (congestive) heart failure: Secondary | ICD-10-CM
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
## 2019-11-16 DIAGNOSIS — J3489 Other specified disorders of nose and nasal sinuses: Secondary | ICD-10-CM | POA: Insufficient documentation

## 2019-11-24 ENCOUNTER — Non-Acute Institutional Stay: Payer: Medicare HMO | Admitting: Hospice

## 2019-11-24 ENCOUNTER — Other Ambulatory Visit: Payer: Self-pay

## 2019-11-24 DIAGNOSIS — I509 Heart failure, unspecified: Secondary | ICD-10-CM

## 2019-11-24 DIAGNOSIS — Z515 Encounter for palliative care: Secondary | ICD-10-CM

## 2019-11-24 DIAGNOSIS — M79641 Pain in right hand: Secondary | ICD-10-CM | POA: Diagnosis not present

## 2019-11-24 DIAGNOSIS — W19XXXA Unspecified fall, initial encounter: Secondary | ICD-10-CM | POA: Diagnosis not present
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
## 2019-12-05 ENCOUNTER — Non-Acute Institutional Stay (SKILLED_NURSING_FACILITY): Payer: Medicare HMO | Admitting: Adult Health

## 2019-12-05 ENCOUNTER — Encounter: Payer: Self-pay | Admitting: Adult Health

## 2019-12-05 DIAGNOSIS — I5042 Chronic combined systolic (congestive) and diastolic (congestive) heart failure: Secondary | ICD-10-CM | POA: Diagnosis not present

## 2019-12-05 DIAGNOSIS — N184 Chronic kidney disease, stage 4 (severe): Secondary | ICD-10-CM

## 2019-12-05 DIAGNOSIS — I25709 Atherosclerosis of coronary artery bypass graft(s), unspecified, with unspecified angina pectoris: Secondary | ICD-10-CM | POA: Diagnosis not present

## 2019-12-05 DIAGNOSIS — E1122 Type 2 diabetes mellitus with diabetic chronic kidney disease: Secondary | ICD-10-CM | POA: Diagnosis not present

## 2019-12-05 DIAGNOSIS — K219 Gastro-esophageal reflux disease without esophagitis: Secondary | ICD-10-CM | POA: Diagnosis not present

## 2019-12-05 DIAGNOSIS — H524 Presbyopia: Secondary | ICD-10-CM
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
## 2019-12-07 ENCOUNTER — Encounter: Payer: Self-pay | Admitting: Adult Health

## 2019-12-07 ENCOUNTER — Non-Acute Institutional Stay (SKILLED_NURSING_FACILITY): Payer: Medicare HMO | Admitting: Adult Health

## 2019-12-07 DIAGNOSIS — I10 Essential (primary) hypertension: Secondary | ICD-10-CM

## 2019-12-07 DIAGNOSIS — K219 Gastro-esophageal reflux disease without esophagitis: Secondary | ICD-10-CM | POA: Diagnosis not present

## 2019-12-07 DIAGNOSIS — N401 Enlarged prostate with lower urinary tract symptoms: Secondary | ICD-10-CM

## 2019-12-07 DIAGNOSIS — E538 Deficiency of other specified B group vitamins: Secondary | ICD-10-CM | POA: Diagnosis not present

## 2019-12-07 DIAGNOSIS — I4819 Other persistent atrial fibrillation: Secondary | ICD-10-CM | POA: Diagnosis not present

## 2019-12-07 DIAGNOSIS — R351 Nocturia: Secondary | ICD-10-CM

## 2019-12-07 DIAGNOSIS — E1122 Type 2 diabetes mellitus with diabetic chronic kidney disease: Secondary | ICD-10-CM

## 2019-12-07 DIAGNOSIS — I25709 Atherosclerosis of coronary artery bypass graft(s), unspecified, with unspecified angina pectoris: Secondary | ICD-10-CM

## 2019-12-07 DIAGNOSIS — Z7189 Other specified counseling: Secondary | ICD-10-CM

## 2019-12-07 DIAGNOSIS — I5042 Chronic combined systolic (congestive) and diastolic (congestive) heart failure: Secondary | ICD-10-CM | POA: Diagnosis not present

## 2019-12-07 DIAGNOSIS — E1169 Type 2 diabetes mellitus with other specified complication: Secondary | ICD-10-CM | POA: Diagnosis not present

## 2019-12-07 DIAGNOSIS — E785 Hyperlipidemia, unspecified: Secondary | ICD-10-CM

## 2019-12-07 DIAGNOSIS — N184 Chronic kidney disease, stage 4 (severe): Secondary | ICD-10-CM
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
## 2019-12-26 ENCOUNTER — Encounter: Payer: Self-pay | Admitting: Adult Health

## 2019-12-26 ENCOUNTER — Non-Acute Institutional Stay (SKILLED_NURSING_FACILITY): Payer: Medicare HMO | Admitting: Adult Health

## 2019-12-26 DIAGNOSIS — U071 COVID-19: Secondary | ICD-10-CM

## 2019-12-26 DIAGNOSIS — J069 Acute upper respiratory infection, unspecified: Secondary | ICD-10-CM | POA: Diagnosis not present
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
## 2019-12-27 DIAGNOSIS — I10 Essential (primary) hypertension: Secondary | ICD-10-CM | POA: Diagnosis not present

## 2019-12-27 DIAGNOSIS — M6281 Muscle weakness (generalized): Secondary | ICD-10-CM | POA: Diagnosis not present

## 2019-12-27 DIAGNOSIS — D649 Anemia, unspecified: Secondary | ICD-10-CM | POA: Diagnosis not present

## 2019-12-27 DIAGNOSIS — M2550 Pain in unspecified joint: Secondary | ICD-10-CM | POA: Diagnosis not present

## 2019-12-27 LAB — BASIC METABOLIC PANEL
BUN: 102 — AB (ref 4–21)
CO2: 26 — AB (ref 13–22)
Chloride: 100 (ref 99–108)
Creatinine: 2.9 — AB (ref 0.6–1.3)
Glucose: 196
Potassium: 3.4 (ref 3.4–5.3)
Sodium: 142 (ref 137–147)

## 2019-12-27 LAB — COMPREHENSIVE METABOLIC PANEL
Calcium: 8.6 — AB (ref 8.7–10.7)
GFR calc Af Amer: 19.81
GFR calc non Af Amer: 17.09

## 2019-12-27 LAB — POCT ERYTHROCYTE SEDIMENTATION RATE, NON-AUTOMATED: Sed Rate: 123

## 2019-12-28 ENCOUNTER — Non-Acute Institutional Stay (SKILLED_NURSING_FACILITY): Payer: Medicare HMO | Admitting: Internal Medicine

## 2019-12-28 ENCOUNTER — Encounter: Payer: Self-pay | Admitting: Internal Medicine

## 2019-12-28 DIAGNOSIS — N184 Chronic kidney disease, stage 4 (severe): Secondary | ICD-10-CM | POA: Diagnosis not present

## 2019-12-28 DIAGNOSIS — U071 COVID-19: Secondary | ICD-10-CM

## 2019-12-28 DIAGNOSIS — D649 Anemia, unspecified: Secondary | ICD-10-CM | POA: Diagnosis not present

## 2019-12-28 DIAGNOSIS — E1122 Type 2 diabetes mellitus with diabetic chronic kidney disease: Secondary | ICD-10-CM | POA: Diagnosis not present

## 2019-12-28 DIAGNOSIS — M6281 Muscle weakness (generalized): Secondary | ICD-10-CM | POA: Diagnosis not present

## 2019-12-28 DIAGNOSIS — I10 Essential (primary) hypertension: Secondary | ICD-10-CM | POA: Diagnosis not present

## 2019-12-28 NOTE — Assessment & Plan Note (Signed)
With adjunct steroids to treat Covid complications; serial glucoses have climbed.  Morning glucoses have ranged from 290 up to 400.  Evening glucoses have ranged from 117 up to 364. Basal insulin will be adjusted with goal of reducing fasting glucoses below 250.  Once steroids are discontinued; insulin doses can be readjusted.

## 2019-12-28 NOTE — Progress Notes (Signed)
NURSING HOME LOCATION:  [facility] ROOM NUMBER:  301-A  CODE STATUS:  DNR  PCP:  [name], MD  [place] [address]   This is a nursing facility follow up of chronic medical diagnoses.  Interim medical record and care since last [place] visit was updated with review of diagnostic studies and change in clinical status since last visit were documented.  HPI: He was transferred to the quarantine unit because of Covid PCR conversion despite having received the first [place] vaccine on 1/18.  Because of his advanced age and comorbidities the [facility] protocol for potential complications of Covid was initiated.  With the oral steroids glucoses have been dramatically elevated. Creatinine has risen to 3.45 and BUN 179 prompting holding the evening dose of Lasix.  Review of systems: Confusion invalidated responses. Date given as January, 2001.  He describes diffuse abdominal pain without other associated GI or GU symptoms.  He is on a PPI at high dose.  He describes "some ,not much" nasal congestion and sore throat.  He describes intermittent dysuria.  He states that he is unable to drink much fluids.  He denies dyspnea or cough or other Covid symptoms.  Eyes: No redness, discharge, pain, vision change ENT/mouth: No  purulent discharge, earache Cardiovascular: No chest pain, palpitations, paroxysmal nocturnal dyspnea Respiratory: No cough, sputum production, hemoptysis Gastrointestinal: No heartburn, dysphagia, nausea /vomiting, rectal bleeding, melena, change in bowels Genitourinary: No  hematuria, pyuria Musculoskeletal: No joint stiffness, joint swelling, weakness, pain Dermatologic: No rash, pruritus, change in appearance of skin Neurologic: No dizziness, headache, syncope,  numbness, tingling Psychiatric: No significant anxiety, depression, insomnia, anorexia Endocrine: No change in hair/skin/nails, excessive thirst  (see above), excessive hunger, excessive urination  Hematologic/lymphatic: No significant bruising, lymphadenopathy, abnormal bleeding Allergy/immunology: No itchy/watery eyes, significant sneezing, urticaria, angioedema  Physical exam:  Pertinent or positive findings: He appears profoundly ill and debilitated. He is too weak to sit up in bed. Hair is disheveled.  He has a thin mustache and a new goatee.  Dense arcus senilis is present.  He is edentulous.  The tongue and oral cavity are markedly dry.  Breath sounds are decreased but minor rales are noted anteriorly.  He exhibits some tachypnea despite nasal oxygen.  Heart rhythm is irregular and heard best in the epigastrium.  Abdomen is soft but diffusely tender to palpation.  Pedal pulses are decreased.  There is no pedal edema.  With palpation of the foot the right toe is upgoing.  He has isolated onycholysis of the nails of the right hand.  The fingernails on the left are irregular and appear somewhat scarred and slightly pitted.  He has mixed arthritic changes of the hands.  He is profoundly weak to opposition.  There is an area of dry, scaly, erythematous skin change over the left shin.  There is no sign of active cellulitis.  General appearance:  no  increased work of breathing is present.   Lymphatic: No lymphadenopathy about the head, neck, axilla. Eyes: No conjunctival inflammation or lid edema is present. There is no scleral icterus. Ears:  External ear exam shows no significant lesions or deformities.   Nose:  External nasal examination shows no deformity or inflammation.  Neck:  No thyromegaly, masses, tenderness noted.    Heart:  No gallop, murmur, click, rub .  Lungs:  without wheezes, rhonchi, rubs. Abdomen:  no organomegaly, hernias, masses. GU: Deferred  Extremities:  No cyanosis,  clubbing, edema  Neurologic exam : Balance, Rhomberg, finger to nose testing could not be completed due to clinical state  Derm:No significant lesions  or rash beyond L shin findings.  See summary under each active problem in the Problem List with associated updated therapeutic plan

## 2019-12-28 NOTE — Assessment & Plan Note (Addendum)
12/27/2019 creatinine 2.93; BUN 102; GFR 17 2/11 creatinine 3.45; BUN 179; and GFR less than 15. Afternoon dose of furosemide 80 mg was held. Furosemide dose will be reduced further because of the profound azotemia and the absence of edema, neck vein distention, & hepatojugular reflux. Oral fluids liberalized.

## 2019-12-28 NOTE — Assessment & Plan Note (Addendum)
Even with aggressive intervention; his prognosis is extremely poor due to advanced age and multiple advanced comorbidities.  Clinically he appears terminal.

## 2019-12-28 NOTE — Patient Instructions (Signed)
See assessment and plan under each diagnosis in the problem list and acutely for this visit 

## 2020-01-01 ENCOUNTER — Inpatient Hospital Stay (HOSPITAL_COMMUNITY): Payer: Medicare HMO | Admitting: Certified Registered Nurse Anesthetist

## 2020-01-01 ENCOUNTER — Encounter (HOSPITAL_COMMUNITY)
Admission: EM | Disposition: A | Discharge status: Hospice | Payer: Self-pay | Source: Skilled Nursing Facility | Attending: Internal Medicine

## 2020-01-01 ENCOUNTER — Inpatient Hospital Stay (HOSPITAL_COMMUNITY): Payer: Medicare HMO

## 2020-01-01 ENCOUNTER — Encounter (HOSPITAL_COMMUNITY): Payer: Self-pay | Admitting: *Deleted

## 2020-01-01 ENCOUNTER — Other Ambulatory Visit: Payer: Self-pay

## 2020-01-01 ENCOUNTER — Emergency Department (HOSPITAL_COMMUNITY): Payer: Medicare HMO

## 2020-01-01 ENCOUNTER — Inpatient Hospital Stay (HOSPITAL_COMMUNITY)
Admission: EM | Admit: 2020-01-01 | Discharge: 2020-01-06 | DRG: 987 | Disposition: A | Discharge status: Hospice | Payer: Medicare HMO | Source: Skilled Nursing Facility | Attending: Internal Medicine | Admitting: Internal Medicine

## 2020-01-01 DIAGNOSIS — E538 Deficiency of other specified B group vitamins: Secondary | ICD-10-CM | POA: Diagnosis present

## 2020-01-01 DIAGNOSIS — G9341 Metabolic encephalopathy: Secondary | ICD-10-CM | POA: Diagnosis present

## 2020-01-01 DIAGNOSIS — R531 Weakness: Secondary | ICD-10-CM | POA: Diagnosis not present

## 2020-01-01 DIAGNOSIS — I129 Hypertensive chronic kidney disease with stage 1 through stage 4 chronic kidney disease, or unspecified chronic kidney disease: Secondary | ICD-10-CM | POA: Diagnosis not present

## 2020-01-01 DIAGNOSIS — E1122 Type 2 diabetes mellitus with diabetic chronic kidney disease: Secondary | ICD-10-CM | POA: Diagnosis not present

## 2020-01-01 DIAGNOSIS — Z66 Do not resuscitate: Secondary | ICD-10-CM | POA: Diagnosis present

## 2020-01-01 DIAGNOSIS — Z951 Presence of aortocoronary bypass graft: Secondary | ICD-10-CM

## 2020-01-01 DIAGNOSIS — Z419 Encounter for procedure for purposes other than remedying health state, unspecified: Secondary | ICD-10-CM

## 2020-01-01 DIAGNOSIS — U071 COVID-19: Secondary | ICD-10-CM | POA: Diagnosis not present

## 2020-01-01 DIAGNOSIS — Z87440 Personal history of urinary (tract) infections: Secondary | ICD-10-CM

## 2020-01-01 DIAGNOSIS — Z515 Encounter for palliative care: Secondary | ICD-10-CM | POA: Diagnosis not present

## 2020-01-01 DIAGNOSIS — N179 Acute kidney failure, unspecified: Secondary | ICD-10-CM | POA: Diagnosis not present

## 2020-01-01 DIAGNOSIS — I130 Hypertensive heart and chronic kidney disease with heart failure and stage 1 through stage 4 chronic kidney disease, or unspecified chronic kidney disease: Secondary | ICD-10-CM | POA: Diagnosis not present

## 2020-01-01 DIAGNOSIS — R4182 Altered mental status, unspecified: Secondary | ICD-10-CM | POA: Diagnosis not present

## 2020-01-01 DIAGNOSIS — N138 Other obstructive and reflux uropathy: Secondary | ICD-10-CM | POA: Diagnosis present

## 2020-01-01 DIAGNOSIS — E559 Vitamin D deficiency, unspecified: Secondary | ICD-10-CM | POA: Diagnosis present

## 2020-01-01 DIAGNOSIS — R0681 Apnea, not elsewhere classified: Secondary | ICD-10-CM | POA: Diagnosis not present

## 2020-01-01 DIAGNOSIS — N401 Enlarged prostate with lower urinary tract symptoms: Secondary | ICD-10-CM | POA: Diagnosis present

## 2020-01-01 DIAGNOSIS — N184 Chronic kidney disease, stage 4 (severe): Secondary | ICD-10-CM | POA: Diagnosis not present

## 2020-01-01 DIAGNOSIS — N201 Calculus of ureter: Secondary | ICD-10-CM | POA: Diagnosis not present

## 2020-01-01 DIAGNOSIS — R911 Solitary pulmonary nodule: Secondary | ICD-10-CM | POA: Diagnosis not present

## 2020-01-01 DIAGNOSIS — R404 Transient alteration of awareness: Secondary | ICD-10-CM | POA: Diagnosis not present

## 2020-01-01 DIAGNOSIS — Z8719 Personal history of other diseases of the digestive system: Secondary | ICD-10-CM

## 2020-01-01 DIAGNOSIS — I5042 Chronic combined systolic (congestive) and diastolic (congestive) heart failure: Secondary | ICD-10-CM | POA: Diagnosis present

## 2020-01-01 DIAGNOSIS — E1142 Type 2 diabetes mellitus with diabetic polyneuropathy: Secondary | ICD-10-CM | POA: Diagnosis present

## 2020-01-01 DIAGNOSIS — N210 Calculus in bladder: Secondary | ICD-10-CM | POA: Diagnosis present

## 2020-01-01 DIAGNOSIS — N390 Urinary tract infection, site not specified: Secondary | ICD-10-CM | POA: Insufficient documentation

## 2020-01-01 DIAGNOSIS — D631 Anemia in chronic kidney disease: Secondary | ICD-10-CM | POA: Diagnosis present

## 2020-01-01 DIAGNOSIS — E1151 Type 2 diabetes mellitus with diabetic peripheral angiopathy without gangrene: Secondary | ICD-10-CM | POA: Diagnosis present

## 2020-01-01 DIAGNOSIS — E870 Hyperosmolality and hypernatremia: Secondary | ICD-10-CM | POA: Diagnosis present

## 2020-01-01 DIAGNOSIS — N136 Pyonephrosis: Secondary | ICD-10-CM | POA: Diagnosis present

## 2020-01-01 DIAGNOSIS — N189 Chronic kidney disease, unspecified: Secondary | ICD-10-CM

## 2020-01-01 DIAGNOSIS — N323 Diverticulum of bladder: Secondary | ICD-10-CM | POA: Diagnosis present

## 2020-01-01 DIAGNOSIS — E1169 Type 2 diabetes mellitus with other specified complication: Secondary | ICD-10-CM | POA: Diagnosis present

## 2020-01-01 DIAGNOSIS — K219 Gastro-esophageal reflux disease without esophagitis: Secondary | ICD-10-CM | POA: Diagnosis present

## 2020-01-01 DIAGNOSIS — I252 Old myocardial infarction: Secondary | ICD-10-CM

## 2020-01-01 DIAGNOSIS — B9562 Methicillin resistant Staphylococcus aureus infection as the cause of diseases classified elsewhere: Secondary | ICD-10-CM | POA: Diagnosis present

## 2020-01-01 DIAGNOSIS — N132 Hydronephrosis with renal and ureteral calculous obstruction: Secondary | ICD-10-CM

## 2020-01-01 DIAGNOSIS — M2550 Pain in unspecified joint: Secondary | ICD-10-CM | POA: Diagnosis not present

## 2020-01-01 DIAGNOSIS — R627 Adult failure to thrive: Secondary | ICD-10-CM | POA: Diagnosis present

## 2020-01-01 DIAGNOSIS — N320 Bladder-neck obstruction: Secondary | ICD-10-CM | POA: Diagnosis present

## 2020-01-01 DIAGNOSIS — J1282 Pneumonia due to coronavirus disease 2019: Secondary | ICD-10-CM | POA: Diagnosis not present

## 2020-01-01 DIAGNOSIS — Z9981 Dependence on supplemental oxygen: Secondary | ICD-10-CM

## 2020-01-01 DIAGNOSIS — F0390 Unspecified dementia without behavioral disturbance: Secondary | ICD-10-CM | POA: Diagnosis present

## 2020-01-01 DIAGNOSIS — Z886 Allergy status to analgesic agent status: Secondary | ICD-10-CM

## 2020-01-01 DIAGNOSIS — Z87891 Personal history of nicotine dependence: Secondary | ICD-10-CM

## 2020-01-01 DIAGNOSIS — R0602 Shortness of breath: Secondary | ICD-10-CM | POA: Diagnosis not present

## 2020-01-01 DIAGNOSIS — Z9049 Acquired absence of other specified parts of digestive tract: Secondary | ICD-10-CM

## 2020-01-01 DIAGNOSIS — Z7401 Bed confinement status: Secondary | ICD-10-CM | POA: Diagnosis not present

## 2020-01-01 DIAGNOSIS — R410 Disorientation, unspecified: Secondary | ICD-10-CM | POA: Diagnosis not present

## 2020-01-01 DIAGNOSIS — I2510 Atherosclerotic heart disease of native coronary artery without angina pectoris: Secondary | ICD-10-CM | POA: Diagnosis present

## 2020-01-01 DIAGNOSIS — E1165 Type 2 diabetes mellitus with hyperglycemia: Secondary | ICD-10-CM | POA: Diagnosis not present

## 2020-01-01 DIAGNOSIS — I4819 Other persistent atrial fibrillation: Secondary | ICD-10-CM | POA: Diagnosis present

## 2020-01-01 DIAGNOSIS — Z79899 Other long term (current) drug therapy: Secondary | ICD-10-CM

## 2020-01-01 DIAGNOSIS — M1990 Unspecified osteoarthritis, unspecified site: Secondary | ICD-10-CM | POA: Diagnosis present

## 2020-01-01 DIAGNOSIS — J9600 Acute respiratory failure, unspecified whether with hypoxia or hypercapnia: Secondary | ICD-10-CM | POA: Diagnosis not present

## 2020-01-01 DIAGNOSIS — Z794 Long term (current) use of insulin: Secondary | ICD-10-CM

## 2020-01-01 DIAGNOSIS — E785 Hyperlipidemia, unspecified: Secondary | ICD-10-CM | POA: Diagnosis present

## 2020-01-01 DIAGNOSIS — Z7982 Long term (current) use of aspirin: Secondary | ICD-10-CM

## 2020-01-01 DIAGNOSIS — Z888 Allergy status to other drugs, medicaments and biological substances status: Secondary | ICD-10-CM

## 2020-01-01 DIAGNOSIS — Z8619 Personal history of other infectious and parasitic diseases: Secondary | ICD-10-CM | POA: Diagnosis not present

## 2020-01-01 HISTORY — PX: CYSTOSCOPY W/ URETERAL STENT PLACEMENT: SHX1429

## 2020-01-01 LAB — LACTATE DEHYDROGENASE: LDH: 334 U/L — ABNORMAL HIGH (ref 98–192)

## 2020-01-01 LAB — ABO/RH: ABO/RH(D): A POS

## 2020-01-01 LAB — URINALYSIS, ROUTINE W REFLEX MICROSCOPIC
Bilirubin Urine: NEGATIVE
Glucose, UA: NEGATIVE mg/dL
Ketones, ur: NEGATIVE mg/dL
Nitrite: POSITIVE — AB
Protein, ur: 30 mg/dL — AB
Specific Gravity, Urine: 1.013 (ref 1.005–1.030)
WBC, UA: >50 WBC/hpf — ABNORMAL HIGH (ref 0–5)
pH: 6 (ref 5.0–8.0)

## 2020-01-01 LAB — COMPREHENSIVE METABOLIC PANEL
ALT: 63 U/L — ABNORMAL HIGH (ref 0–44)
AST: 56 U/L — ABNORMAL HIGH (ref 15–41)
Albumin: 2.9 g/dL — ABNORMAL LOW (ref 3.5–5.0)
Alkaline Phosphatase: 40 U/L (ref 38–126)
Anion gap: 17 — ABNORMAL HIGH (ref 5–15)
BUN: 201 mg/dL — ABNORMAL HIGH (ref 8–23)
CO2: 19 mmol/L — ABNORMAL LOW (ref 22–32)
Calcium: 10 mg/dL (ref 8.9–10.3)
Chloride: 112 mmol/L — ABNORMAL HIGH (ref 98–111)
Creatinine, Ser: 4.24 mg/dL — ABNORMAL HIGH (ref 0.61–1.24)
GFR calc Af Amer: 13 mL/min — ABNORMAL LOW (ref >60)
GFR calc non Af Amer: 11 mL/min — ABNORMAL LOW (ref >60)
Glucose, Bld: 236 mg/dL — ABNORMAL HIGH (ref 70–99)
Potassium: 5.4 mmol/L — ABNORMAL HIGH (ref 3.5–5.1)
Sodium: 148 mmol/L — ABNORMAL HIGH (ref 135–145)
Total Bilirubin: 1.1 mg/dL (ref 0.3–1.2)
Total Protein: 7.5 g/dL (ref 6.5–8.1)

## 2020-01-01 LAB — PROCALCITONIN: Procalcitonin: 0.36 ng/mL

## 2020-01-01 LAB — CBC WITH DIFFERENTIAL/PLATELET
Abs Immature Granulocytes: 0.26 10*3/uL — ABNORMAL HIGH (ref 0.00–0.07)
Basophils Absolute: 0 10*3/uL (ref 0.0–0.1)
Basophils Relative: 0 %
Eosinophils Absolute: 0 10*3/uL (ref 0.0–0.5)
Eosinophils Relative: 0 %
HCT: 33.9 % — ABNORMAL LOW (ref 39.0–52.0)
Hemoglobin: 10.7 g/dL — ABNORMAL LOW (ref 13.0–17.0)
Immature Granulocytes: 2 %
Lymphocytes Relative: 5 %
Lymphs Abs: 0.8 10*3/uL (ref 0.7–4.0)
MCH: 29.7 pg (ref 26.0–34.0)
MCHC: 31.6 g/dL (ref 30.0–36.0)
MCV: 94.2 fL (ref 80.0–100.0)
Monocytes Absolute: 0.7 10*3/uL (ref 0.1–1.0)
Monocytes Relative: 5 %
Neutro Abs: 13.2 10*3/uL — ABNORMAL HIGH (ref 1.7–7.7)
Neutrophils Relative %: 88 %
Platelets: 233 10*3/uL (ref 150–400)
RBC: 3.6 MIL/uL — ABNORMAL LOW (ref 4.22–5.81)
RDW: 14.1 % (ref 11.5–15.5)
WBC: 15 10*3/uL — ABNORMAL HIGH (ref 4.0–10.5)
nRBC: 0.2 % (ref 0.0–0.2)

## 2020-01-01 LAB — LACTIC ACID, PLASMA: Lactic Acid, Venous: 1.9 mmol/L (ref 0.5–1.9)

## 2020-01-01 LAB — GLUCOSE, CAPILLARY: Glucose-Capillary: 300 mg/dL — ABNORMAL HIGH (ref 70–99)

## 2020-01-01 LAB — TRIGLYCERIDES: Triglycerides: 300 mg/dL — ABNORMAL HIGH (ref <150)

## 2020-01-01 LAB — FIBRINOGEN: Fibrinogen: >800 mg/dL — ABNORMAL HIGH (ref 210–475)

## 2020-01-01 LAB — RESPIRATORY PANEL BY RT PCR (FLU A&B, COVID)
Influenza A by PCR: NEGATIVE
Influenza B by PCR: NEGATIVE
SARS Coronavirus 2 by RT PCR: POSITIVE — AB

## 2020-01-01 LAB — D-DIMER, QUANTITATIVE: D-Dimer, Quant: 2.32 ug/mL-FEU — ABNORMAL HIGH (ref 0.00–0.50)

## 2020-01-01 SURGERY — CYSTOSCOPY, FLEXIBLE, WITH STENT REPLACEMENT
Anesthesia: Monitor Anesthesia Care

## 2020-01-01 MED ORDER — HYDRALAZINE HCL 25 MG PO TABS: 25.0000 mg | ORAL_TABLET | Freq: Four times a day (QID) | ORAL | Status: DC | PRN

## 2020-01-01 MED ORDER — PHENOL 1.4 % MT LIQD
2.0000 | Freq: Three times a day (TID) | OROMUCOSAL | Status: DC
  Administered 2020-01-02 – 2020-01-04 (×5): 2 via OROMUCOSAL
  Filled 2020-01-01 (×2): qty 177

## 2020-01-01 MED ORDER — WATER FOR IRRIGATION, STERILE IR SOLN
Status: DC | PRN
  Administered 2020-01-01: 3000 mL

## 2020-01-01 MED ORDER — ISOSORBIDE MONONITRATE ER 30 MG PO TB24
30.0000 mg | ORAL_TABLET | Freq: Every day | ORAL | Status: DC
  Administered 2020-01-01 – 2020-01-02 (×2): 30 mg via ORAL
  Filled 2020-01-01 (×2): qty 1

## 2020-01-01 MED ORDER — INSULIN ASPART 100 UNIT/ML ~~LOC~~ SOLN
0.0000 [IU] | Freq: Every day | SUBCUTANEOUS | Status: DC
  Administered 2020-01-01: 3 [IU] via SUBCUTANEOUS
  Administered 2020-01-03: 22:00:00 2 [IU] via SUBCUTANEOUS

## 2020-01-01 MED ORDER — INSULIN GLARGINE 100 UNIT/ML ~~LOC~~ SOLN
12.0000 [IU] | Freq: Every day | SUBCUTANEOUS | Status: DC
  Administered 2020-01-01 – 2020-01-03 (×3): 12 [IU] via SUBCUTANEOUS
  Filled 2020-01-01 (×6): qty 0.12

## 2020-01-01 MED ORDER — VITAMIN B-12 1000 MCG PO TABS
1000.0000 ug | ORAL_TABLET | Freq: Every day | ORAL | Status: DC
  Administered 2020-01-01 – 2020-01-02 (×2): 1000 ug via ORAL
  Filled 2020-01-01 (×3): qty 1

## 2020-01-01 MED ORDER — LIDOCAINE HCL 2 % EX GEL
CUTANEOUS | Status: DC | PRN
  Administered 2020-01-01: 1 via TOPICAL

## 2020-01-01 MED ORDER — POLYETHYLENE GLYCOL 3350 17 G PO PACK: 17.0000 g | PACK | Freq: Every day | ORAL | Status: DC | PRN

## 2020-01-01 MED ORDER — SODIUM CHLORIDE 0.9 % IV SOLN: 100.0000 mg | Freq: Every day | INTRAVENOUS | Status: DC

## 2020-01-01 MED ORDER — SODIUM CHLORIDE 0.9 % IV SOLN: INTRAVENOUS | Status: DC | PRN

## 2020-01-01 MED ORDER — SODIUM CHLORIDE 0.9 % IV SOLN
200.0000 mg | Freq: Once | INTRAVENOUS | Status: DC
  Filled 2020-01-01: qty 40

## 2020-01-01 MED ORDER — ALBUTEROL SULFATE HFA 108 (90 BASE) MCG/ACT IN AERS
2.0000 | INHALATION_SPRAY | Freq: Four times a day (QID) | RESPIRATORY_TRACT | Status: DC
  Administered 2020-01-01 – 2020-01-02 (×5): 2 via RESPIRATORY_TRACT
  Filled 2020-01-01 (×2): qty 6.7

## 2020-01-01 MED ORDER — TAMSULOSIN HCL 0.4 MG PO CAPS
0.4000 mg | ORAL_CAPSULE | Freq: Every day | ORAL | Status: DC
  Administered 2020-01-01 – 2020-01-02 (×2): 0.4 mg via ORAL
  Filled 2020-01-01 (×2): qty 1

## 2020-01-01 MED ORDER — PROPOFOL 500 MG/50ML IV EMUL
INTRAVENOUS | Status: DC | PRN
  Administered 2020-01-01: 75 ug/kg/min via INTRAVENOUS

## 2020-01-01 MED ORDER — SODIUM CHLORIDE 0.9 % IV SOLN
1.0000 g | INTRAVENOUS | Status: DC
  Administered 2020-01-01 – 2020-01-03 (×3): 1 g via INTRAVENOUS
  Filled 2020-01-01 (×2): qty 10

## 2020-01-01 MED ORDER — BASAGLAR KWIKPEN 100 UNIT/ML ~~LOC~~ SOPN: 12.0000 [IU] | PEN_INJECTOR | Freq: Every day | SUBCUTANEOUS | Status: DC

## 2020-01-01 MED ORDER — PROPOFOL 10 MG/ML IV BOLUS
INTRAVENOUS | Status: AC
  Filled 2020-01-01: qty 20

## 2020-01-01 MED ORDER — SODIUM CHLORIDE 0.9 % IV SOLN
1.0000 g | Freq: Once | INTRAVENOUS | Status: AC
  Administered 2020-01-01: 1 g via INTRAVENOUS
  Filled 2020-01-01: qty 10

## 2020-01-01 MED ORDER — VITAMIN D 25 MCG (1000 UNIT) PO TABS
1000.0000 [IU] | ORAL_TABLET | Freq: Every day | ORAL | Status: DC
  Administered 2020-01-01 – 2020-01-02 (×2): 1000 [IU] via ORAL
  Filled 2020-01-01 (×3): qty 1

## 2020-01-01 MED ORDER — PROPOFOL 10 MG/ML IV BOLUS
INTRAVENOUS | Status: DC | PRN
  Administered 2020-01-01 (×2): 20 mg via INTRAVENOUS

## 2020-01-01 MED ORDER — GUAIFENESIN-DM 100-10 MG/5ML PO SYRP: 10.0000 mL | ORAL_SOLUTION | ORAL | Status: DC | PRN

## 2020-01-01 MED ORDER — PHENOL 1.4 % MT LIQD: 2.0000 | Freq: Three times a day (TID) | OROMUCOSAL | Status: DC

## 2020-01-01 MED ORDER — LIDOCAINE HCL URETHRAL/MUCOSAL 2 % EX GEL
CUTANEOUS | Status: AC
  Filled 2020-01-01: qty 20

## 2020-01-01 MED ORDER — SODIUM CHLORIDE 0.9 % IV SOLN
200.0000 mg | Freq: Once | INTRAVENOUS | Status: AC
  Administered 2020-01-01: 15:00:00 200 mg via INTRAVENOUS
  Filled 2020-01-01: qty 40

## 2020-01-01 MED ORDER — SODIUM CHLORIDE 0.9 % IV SOLN
1.0000 g | Freq: Once | INTRAVENOUS | Status: DC
  Filled 2020-01-01: qty 10

## 2020-01-01 MED ORDER — SODIUM CHLORIDE 0.9 % IV SOLN
100.0000 mg | Freq: Every day | INTRAVENOUS | Status: DC
  Administered 2020-01-02 – 2020-01-04 (×3): 100 mg via INTRAVENOUS
  Filled 2020-01-01 (×3): qty 20

## 2020-01-01 MED ORDER — ONDANSETRON HCL 4 MG/2ML IJ SOLN: 4.0000 mg | Freq: Four times a day (QID) | INTRAMUSCULAR | Status: DC | PRN

## 2020-01-01 MED ORDER — ONDANSETRON HCL 4 MG PO TABS: 4.0000 mg | ORAL_TABLET | Freq: Four times a day (QID) | ORAL | Status: DC | PRN

## 2020-01-01 MED ORDER — DEXAMETHASONE 6 MG PO TABS
6.0000 mg | ORAL_TABLET | Freq: Every day | ORAL | Status: DC
  Administered 2020-01-02: 6 mg via ORAL
  Filled 2020-01-01 (×3): qty 1

## 2020-01-01 MED ORDER — ACETAMINOPHEN 650 MG RE SUPP: 650.0000 mg | Freq: Four times a day (QID) | RECTAL | Status: DC | PRN

## 2020-01-01 MED ORDER — ASPIRIN EC 81 MG PO TBEC
81.0000 mg | DELAYED_RELEASE_TABLET | Freq: Every day | ORAL | Status: DC
  Administered 2020-01-01 – 2020-01-02 (×2): 81 mg via ORAL
  Filled 2020-01-01 (×3): qty 1

## 2020-01-01 MED ORDER — SODIUM CHLORIDE 0.9 % IV SOLN: INTRAVENOUS | Status: AC

## 2020-01-01 MED ORDER — ASCORBIC ACID 500 MG PO TABS
500.0000 mg | ORAL_TABLET | Freq: Every day | ORAL | Status: DC
  Administered 2020-01-01 – 2020-01-02 (×2): 500 mg via ORAL
  Filled 2020-01-01 (×3): qty 1

## 2020-01-01 MED ORDER — ZINC SULFATE 220 (50 ZN) MG PO CAPS
220.0000 mg | ORAL_CAPSULE | Freq: Every day | ORAL | Status: DC
  Administered 2020-01-01 – 2020-01-02 (×2): 220 mg via ORAL
  Filled 2020-01-01 (×3): qty 1

## 2020-01-01 MED ORDER — 0.9 % SODIUM CHLORIDE (POUR BTL) OPTIME
TOPICAL | Status: DC | PRN
  Administered 2020-01-01: 17:00:00 1000 mL

## 2020-01-01 MED ORDER — INSULIN ASPART 100 UNIT/ML ~~LOC~~ SOLN
0.0000 [IU] | Freq: Three times a day (TID) | SUBCUTANEOUS | Status: DC
  Administered 2020-01-02 (×2): 3 [IU] via SUBCUTANEOUS
  Administered 2020-01-03 (×2): 5 [IU] via SUBCUTANEOUS
  Administered 2020-01-03: 3 [IU] via SUBCUTANEOUS
  Administered 2020-01-04 (×2): 8 [IU] via SUBCUTANEOUS

## 2020-01-01 MED ORDER — FENTANYL CITRATE (PF) 100 MCG/2ML IJ SOLN
50.0000 ug | Freq: Once | INTRAMUSCULAR | Status: AC
  Administered 2020-01-01: 50 ug via INTRAVENOUS
  Filled 2020-01-01: qty 2

## 2020-01-01 MED ORDER — ADULT MULTIVITAMIN W/MINERALS CH
1.0000 | ORAL_TABLET | Freq: Every day | ORAL | Status: DC
  Administered 2020-01-01 – 2020-01-02 (×2): 1 via ORAL
  Filled 2020-01-01 (×3): qty 1

## 2020-01-01 MED ORDER — ACETAMINOPHEN 325 MG PO TABS: 650.0000 mg | ORAL_TABLET | Freq: Four times a day (QID) | ORAL | Status: DC | PRN

## 2020-01-01 MED ORDER — HYDROMORPHONE HCL 1 MG/ML IJ SOLN
0.5000 mg | INTRAMUSCULAR | Status: DC | PRN
  Administered 2020-01-02 – 2020-01-04 (×10): 0.5 mg via INTRAVENOUS
  Filled 2020-01-01 (×10): qty 0.5

## 2020-01-01 MED ORDER — METOPROLOL TARTRATE 12.5 MG HALF TABLET
12.5000 mg | ORAL_TABLET | Freq: Two times a day (BID) | ORAL | Status: DC
  Administered 2020-01-01 – 2020-01-02 (×4): 12.5 mg via ORAL
  Filled 2020-01-01 (×5): qty 1

## 2020-01-01 MED ORDER — ROSUVASTATIN CALCIUM 5 MG PO TABS
10.0000 mg | ORAL_TABLET | ORAL | Status: DC
  Administered 2020-01-01: 14:00:00 10 mg via ORAL
  Filled 2020-01-01 (×2): qty 2

## 2020-01-01 MED ORDER — SODIUM CHLORIDE 0.9 % IV BOLUS
500.0000 mL | Freq: Once | INTRAVENOUS | Status: AC
  Administered 2020-01-01: 09:00:00 1000 mL via INTRAVENOUS

## 2020-01-01 MED ORDER — SODIUM CHLORIDE 0.9 % IV BOLUS
500.0000 mL | Freq: Once | INTRAVENOUS | Status: AC
  Administered 2020-01-01: 500 mL via INTRAVENOUS

## 2020-01-01 MED ORDER — HEPARIN SODIUM (PORCINE) 5000 UNIT/ML IJ SOLN
5000.0000 [IU] | Freq: Two times a day (BID) | INTRAMUSCULAR | Status: DC
  Administered 2020-01-01 – 2020-01-02 (×2): 5000 [IU] via SUBCUTANEOUS
  Filled 2020-01-01 (×2): qty 1

## 2020-01-01 MED ORDER — BISACODYL 10 MG RE SUPP: 10.0000 mg | Freq: Every day | RECTAL | Status: DC | PRN

## 2020-01-01 MED ORDER — LATANOPROST 0.005 % OP SOLN
1.0000 [drp] | Freq: Every day | OPHTHALMIC | Status: DC
  Administered 2020-01-02: 21:00:00 1 [drp] via OPHTHALMIC
  Filled 2020-01-01: qty 2.5

## 2020-01-01 MED ORDER — PANTOPRAZOLE SODIUM 40 MG PO TBEC
40.0000 mg | DELAYED_RELEASE_TABLET | Freq: Every day | ORAL | Status: DC
  Administered 2020-01-01 – 2020-01-02 (×2): 40 mg via ORAL
  Filled 2020-01-01 (×3): qty 1

## 2020-01-01 MED ORDER — FINASTERIDE 5 MG PO TABS
5.0000 mg | ORAL_TABLET | Freq: Every day | ORAL | Status: DC
  Administered 2020-01-02: 5 mg via ORAL
  Filled 2020-01-01 (×4): qty 1

## 2020-01-01 SURGICAL SUPPLY — 27 items
ADAPTER CATH URET PLST 4-6FR (CATHETERS) ×2 IMPLANT
ADPR CATH URET STRL DISP 4-6FR (CATHETERS) ×1
BAG DRN RND TRDRP ANRFLXCHMBR (UROLOGICAL SUPPLIES) ×1
BAG URINE DRAIN 2000ML AR STRL (UROLOGICAL SUPPLIES) ×3 IMPLANT
BAG URO CATCHER STRL LF (MISCELLANEOUS) ×3 IMPLANT
CATH FOLEY 2WAY SLVR  5CC 16FR (CATHETERS) ×2
CATH FOLEY 2WAY SLVR 5CC 16FR (CATHETERS) IMPLANT
CATH URET 5FR 28IN OPEN ENDED (CATHETERS) ×3 IMPLANT
GLOVE SURG SS PI 8.0 STRL IVOR (GLOVE) ×3 IMPLANT
GOWN STRL REUS W/ TWL LRG LVL3 (GOWN DISPOSABLE) ×1 IMPLANT
GOWN STRL REUS W/ TWL XL LVL3 (GOWN DISPOSABLE) ×1 IMPLANT
GOWN STRL REUS W/TWL LRG LVL3 (GOWN DISPOSABLE) ×3
GOWN STRL REUS W/TWL XL LVL3 (GOWN DISPOSABLE) ×3
GUIDEWIRE ANG ZIPWIRE 038X150 (WIRE) IMPLANT
GUIDEWIRE STR DUAL SENSOR (WIRE) ×3 IMPLANT
KIT TURNOVER KIT B (KITS) ×3 IMPLANT
MANIFOLD NEPTUNE II (INSTRUMENTS) IMPLANT
NS IRRIG 1000ML POUR BTL (IV SOLUTION) IMPLANT
PACK CYSTO (CUSTOM PROCEDURE TRAY) ×3 IMPLANT
SET IRRIG Y TYPE TUR BLADDER L (SET/KITS/TRAYS/PACK) IMPLANT
STENT URET 6FRX24 CONTOUR (STENTS) IMPLANT
STENT URET 6FRX26 CONTOUR (STENTS) IMPLANT
SYPHON OMNI JUG (MISCELLANEOUS) ×3 IMPLANT
TOWEL GREEN STERILE FF (TOWEL DISPOSABLE) ×3 IMPLANT
TUBE CONNECTING 12'X1/4 (SUCTIONS)
TUBE CONNECTING 12X1/4 (SUCTIONS) IMPLANT
WATER STERILE IRR 3000ML UROMA (IV SOLUTION) ×3 IMPLANT

## 2020-01-01 NOTE — Anesthesia Postprocedure Evaluation (Signed)
Anesthesia Post Note  Patient: Cole [name]  Procedure(s) Performed: CYSTOSCOPY WITH FOLEY PLACEMENT (N/A )     Patient location during evaluation: Other (Recovered in OR due to COVID) Anesthesia Type: MAC Level of consciousness: awake and alert Pain management: pain level controlled Vital Signs Assessment: post-procedure vital signs reviewed and stable Respiratory status: spontaneous breathing, nonlabored ventilation, respiratory function stable and patient connected to nasal cannula oxygen Cardiovascular status: stable and blood pressure returned to baseline Postop Assessment: no apparent nausea or vomiting Anesthetic complications: no    Last Vitals:  Vitals:   01/01/20 1723 01/01/20 1802  BP: (!) 143/61 129/86  Pulse: 71   Resp:    Temp:  (!) 36.3 C  SpO2: 99%     Last Pain:  Vitals:   01/01/20 1802  TempSrc: Axillary  PainSc:                  Catalina Gravel

## 2020-01-01 NOTE — Transfer of Care (Signed)
Immediate Anesthesia Transfer of Care Note  Patient: Cole [name]  Procedure(s) Performed: CYSTOSCOPY WITH FOLEY PLACEMENT (N/A )  Patient Location: OR Covid +  Anesthesia Type:MAC  Level of Consciousness: awake, patient cooperative and confused  Airway & Oxygen Therapy: Patient Spontanous Breathing  Post-op Assessment: Report given to RN and Post -op Vital signs reviewed and stable  Post vital signs: Reviewed and stable  Last Vitals:  Vitals Value Taken Time  BP 131/57 01/01/20 1711  Temp    Pulse 67 01/01/20 1713  Resp    SpO2 94 % 01/01/20 1713  Vitals shown include unvalidated device data.  Last Pain:  Vitals:   01/01/20 0831  TempSrc: Oral         Complications: No apparent anesthesia complications   Report to RN at bedside in OR.  Mental status same as baseline.   Tolerated procedure well.

## 2020-01-01 NOTE — Anesthesia Preprocedure Evaluation (Addendum)
Anesthesia Evaluation  Patient identified by MRN, date of birth, ID band Patient confused    Reviewed: Allergy & Precautions, NPO status , Patient's Chart, lab work & pertinent test results, reviewed documented beta blocker date and time   Airway Mallampati: II  TM Distance: >3 FB Neck ROM: Full    Dental  (+) Dental Advisory Given   Pulmonary former smoker,  COVID +   Pulmonary exam normal breath sounds clear to auscultation       Cardiovascular hypertension, Pt. on medications and Pt. on home beta blockers + angina + CAD, + Past MI, + CABG, + Peripheral Vascular Disease and +CHF  Normal cardiovascular exam+ dysrhythmias Supra Ventricular Tachycardia  Rhythm:Regular Rate:Normal     Neuro/Psych  Neuromuscular disease    GI/Hepatic Neg liver ROS, GERD  ,  Endo/Other  diabetes, Type 2  Renal/GU Renal disease (AKI)Left UVJ Stone w/ infection     Musculoskeletal  (+) Arthritis ,   Abdominal   Peds  Hematology  (+) Blood dyscrasia, anemia ,   Anesthesia Other Findings Day of surgery medications reviewed with the patient.  Reproductive/Obstetrics                            Anesthesia Physical Anesthesia Plan  ASA: IV  Anesthesia Plan: MAC   Post-op Pain Management:    Induction: Intravenous  PONV Risk Score and Plan: 1 and Propofol infusion and Treatment may vary due to age or medical condition  Airway Management Planned: Natural Airway and Nasal Cannula  Additional Equipment:   Intra-op Plan:   Post-operative Plan:   Informed Consent: I have reviewed the patients History and Physical, chart, labs and discussed the procedure including the risks, benefits and alternatives for the proposed anesthesia with the patient or authorized representative who has indicated his/her understanding and acceptance.   Patient has DNR.  Discussed DNR with power of attorney.   Dental advisory given  and Consent reviewed with POA  Plan Discussed with: CRNA  Anesthesia Plan Comments: (DNR discussion with patient's son: NO CPR.)       Anesthesia Quick Evaluation

## 2020-01-01 NOTE — Op Note (Signed)
Procedure: 1.  Cystoscopy with left ureteral catheterization insertion of Foley catheter. 2.  Application of fluoroscopy.  Preop diagnosis: 1.2 cm left distal ureteral stone with obstruction, infection and acute on chronic renal insufficiency.  Postop diagnosis: 1.2 cm bladder stone that could have recently passed with considerable purulent material in the bladder and a patent left ureter.  Surgeon: Dr. Irine Seal.  Anesthesia: MAC.  EBL: None.  Drains: 36 French Foley catheter.  Specimen: None.  Complications: None.  Indications: The patient is a 86 year old male who was found on CT scan to have a 1.2 cm stone in the left distal ureter with obstruction and acute on chronic renal insufficiency with a probable UTI.  Was felt that stenting was indicated.  Procedure: He had received a gram of Rocephin earlier today and that was repeated in the OR.  He was placed on the table in the lithotomy position and fitted with PAS hose.  He was prepped with Betadine solution draped in usual sterile fashion.  Sedation was given as needed.  Cystoscopy was performed using the 23 [place] scope and 30 degree lens.  Examination revealed a normal urethra with exception of a mild bulbar stricture.  The prostate urethra had bilobar hyperplasia with coaptation and obstruction.  Examination of bladder revealed moderate severe trabeculation with cellules and small diverticuli.  There was marked purulent material in the base the bladder that was evacuated out and once visualization cleared a stone consistent with a stone seen on CT was actually seen in the left base the bladder adjacent to the left ureteral orifice which was widely patent.  The right ureteral orifice was unremarkable.  The left ureteral orifice was easily cannulated with the sensor wire and open-ended catheter which passed to the kidney without difficulty.  No additional efflux of urine was noted along the wire.  Fluoroscopy was then applied to confirm  no additional stones were noted alongside the wire and none were seen.  The wire and open-ended catheter were then removed.  We are not prepared to fragment and remove the stone particular in the face of active infection.  The cystoscope was removed and a 16 French Foley catheter was inserted.  The balloon was filled with 10 mL of sterile fluid and placed to straight drainage.  He was taken down from lithotomy position and moved recovery in stable condition.  There were no complications.  I contacted his son and explained the procedure to him as well as the need to do subsequent cystolitholapaxy at a later date.
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
## 2020-01-01 NOTE — ED Notes (Signed)
Report attempted, 5W unable to take at this time. Left callback # with Network engineer.

## 2020-01-01 NOTE — ED Triage Notes (Signed)
Patient presents to ed via [facility] from [place] states he was dx. With covid 2 weeks ago, Just finished zithromax 2/13. Per staff patient is normally alert and oriented x 4 , states patient has increased confusion. Denies fever. Patient c/o generalized bodyaches.
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
## 2020-01-01 NOTE — ED Notes (Signed)
Patient returned from CT

## 2020-01-01 NOTE — Anesthesia Procedure Notes (Signed)
Procedure Name: MAC Date/Time: 01/01/2020 4:20 PM Performed by: Oletta Lamas, CRNA Pre-anesthesia Checklist: Patient identified, Emergency Drugs available, Suction available, Patient being monitored and Timeout performed Patient Re-evaluated:Patient Re-evaluated prior to induction Oxygen Delivery Method: Simple face mask

## 2020-01-01 NOTE — ED Notes (Signed)
Patient pulled back up on stretcher and repositioned

## 2020-01-02 LAB — CBC WITH DIFFERENTIAL/PLATELET
Abs Immature Granulocytes: 0.18 10*3/uL — ABNORMAL HIGH (ref 0.00–0.07)
Basophils Absolute: 0 10*3/uL (ref 0.0–0.1)
Basophils Relative: 0 %
Eosinophils Absolute: 0 10*3/uL (ref 0.0–0.5)
Eosinophils Relative: 0 %
HCT: 27.2 % — ABNORMAL LOW (ref 39.0–52.0)
Hemoglobin: 8.5 g/dL — ABNORMAL LOW (ref 13.0–17.0)
Immature Granulocytes: 2 %
Lymphocytes Relative: 7 %
Lymphs Abs: 0.6 10*3/uL — ABNORMAL LOW (ref 0.7–4.0)
MCH: 29.4 pg (ref 26.0–34.0)
MCHC: 31.3 g/dL (ref 30.0–36.0)
MCV: 94.1 fL (ref 80.0–100.0)
Monocytes Absolute: 0.5 10*3/uL (ref 0.1–1.0)
Monocytes Relative: 5 %
Neutro Abs: 8 10*3/uL — ABNORMAL HIGH (ref 1.7–7.7)
Neutrophils Relative %: 86 %
Platelets: 211 10*3/uL (ref 150–400)
RBC: 2.89 MIL/uL — ABNORMAL LOW (ref 4.22–5.81)
RDW: 14.1 % (ref 11.5–15.5)
WBC: 9.3 10*3/uL (ref 4.0–10.5)
nRBC: 0.2 % (ref 0.0–0.2)

## 2020-01-02 LAB — COMPREHENSIVE METABOLIC PANEL
ALT: 45 U/L — ABNORMAL HIGH (ref 0–44)
AST: 35 U/L (ref 15–41)
Albumin: 2.2 g/dL — ABNORMAL LOW (ref 3.5–5.0)
Alkaline Phosphatase: 34 U/L — ABNORMAL LOW (ref 38–126)
Anion gap: 17 — ABNORMAL HIGH (ref 5–15)
BUN: 177 mg/dL — ABNORMAL HIGH (ref 8–23)
CO2: 19 mmol/L — ABNORMAL LOW (ref 22–32)
Calcium: 9.2 mg/dL (ref 8.9–10.3)
Chloride: 118 mmol/L — ABNORMAL HIGH (ref 98–111)
Creatinine, Ser: 3.67 mg/dL — ABNORMAL HIGH (ref 0.61–1.24)
GFR calc Af Amer: 15 mL/min — ABNORMAL LOW (ref >60)
GFR calc non Af Amer: 13 mL/min — ABNORMAL LOW (ref >60)
Glucose, Bld: 257 mg/dL — ABNORMAL HIGH (ref 70–99)
Potassium: 4.2 mmol/L (ref 3.5–5.1)
Sodium: 154 mmol/L — ABNORMAL HIGH (ref 135–145)
Total Bilirubin: 0.8 mg/dL (ref 0.3–1.2)
Total Protein: 6.2 g/dL — ABNORMAL LOW (ref 6.5–8.1)

## 2020-01-02 LAB — GLUCOSE, CAPILLARY
Glucose-Capillary: 198 mg/dL — ABNORMAL HIGH (ref 70–99)
Glucose-Capillary: 185 mg/dL — ABNORMAL HIGH (ref 70–99)
Glucose-Capillary: 156 mg/dL — ABNORMAL HIGH (ref 70–99)
Glucose-Capillary: 175 mg/dL — ABNORMAL HIGH (ref 70–99)

## 2020-01-02 LAB — HEMOGLOBIN A1C
Hgb A1c MFr Bld: 8.2 % — ABNORMAL HIGH (ref 4.8–5.6)
Mean Plasma Glucose: 189 mg/dL

## 2020-01-02 LAB — D-DIMER, QUANTITATIVE: D-Dimer, Quant: 1.68 ug/mL-FEU — ABNORMAL HIGH (ref 0.00–0.50)

## 2020-01-02 LAB — PHOSPHORUS: Phosphorus: 4.7 mg/dL — ABNORMAL HIGH (ref 2.5–4.6)

## 2020-01-02 LAB — FERRITIN: Ferritin: 394 ng/mL — ABNORMAL HIGH (ref 24–336)

## 2020-01-02 LAB — MAGNESIUM: Magnesium: 2.4 mg/dL (ref 1.7–2.4)

## 2020-01-02 LAB — C-REACTIVE PROTEIN: CRP: 7.3 mg/dL — ABNORMAL HIGH (ref <1.0)

## 2020-01-02 MED ORDER — ALBUTEROL SULFATE HFA 108 (90 BASE) MCG/ACT IN AERS
2.0000 | INHALATION_SPRAY | Freq: Three times a day (TID) | RESPIRATORY_TRACT | Status: DC
  Administered 2020-01-03 – 2020-01-04 (×3): 2 via RESPIRATORY_TRACT
  Filled 2020-01-02: qty 6.7

## 2020-01-02 MED ORDER — HEPARIN SODIUM (PORCINE) 5000 UNIT/ML IJ SOLN
5000.0000 [IU] | Freq: Three times a day (TID) | INTRAMUSCULAR | Status: DC
  Administered 2020-01-02 – 2020-01-04 (×6): 5000 [IU] via SUBCUTANEOUS
  Filled 2020-01-02 (×6): qty 1

## 2020-01-02 MED ORDER — CHLORHEXIDINE GLUCONATE CLOTH 2 % EX PADS
6.0000 | MEDICATED_PAD | Freq: Every day | CUTANEOUS | Status: DC
  Administered 2020-01-02 – 2020-01-04 (×3): 6 via TOPICAL
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
## 2020-01-02 NOTE — Progress Notes (Signed)
PROGRESS NOTE                                                                                                                                                                                                             Patient Demographics:    Cole [name], is a 86 y.o. male, DOB - [birth year], [id number]:[id number]  Admit date - 01/01/2020   Admitting Physician Lequita Halt, MD  Outpatient Primary MD for the patient is [name], MD  LOS - 1   Chief Complaint  Patient presents with  . Shortness of Breath  . Failure To Thrive       Brief Narrative     86 y.o. male with medical history significant of systolic CHF, CAD, diabetes, hypertension, CKD, presented from [facility] skilled nursing facility for evaluation of Covid and failure to thrive. Patient was diagnosed with Covid 2 weeks ago, before that he received 1st dose of COVID 19 vaccine on 12/04/2019.  He was treated with azithromycin, and steroid taper, patient has become increasingly confused, is not eating and drinking well, and has been complaining of generalized body aches.  As well worsening creatinine at 3.4, Patient's family was contacted and confirmed that patient is DNR and not desiring dialysis. Baseline is able to walk inside his room on his own. His work-up was significant for worsening renal function to 4.2 from 3.4 baseline 4 days ago, as well CT scan showing groundglass opacities consistent with COVID-19 pneumonia, and 12 mm left UVJ calculi with moderate hydronephrosis, seen by urology, status post cystoscopy, stones appear to be passed, but having pus at the site .     Subjective:    [name] today himself is unreliable historian, cannot answer questions appropriately, states it scared, when asked where he cannot answer .    Assessment  & Plan :    Active Problems:   Acute kidney injury superimposed on chronic kidney disease (HCC)   Hydronephrosis with renal and ureteral  calculus obstruction   AKI (acute kidney injury) ([place])   Acute metabolic encephalopathy - Multifactorial, patient with underlying dementia. -Most likely worsened due to infectious process including infected kidney stone, possibly COVID-19 encephalopathy, as well uremia from worsening renal failure. -Consult SLP to advance diet if appropriate  Left hydronephrosis with 1.2 cm left distal ureteral stone with obstruction and infection . -  Appreciated, s/p cystoscopy/fluoroscopy, the bladder stone, that possibly had just passed prior to the OR, and considerable pus in the bladder with evidence of outlet obstruction . -Management per urology, for now maintain Foley catheter drainage(he has bladder obstruction from bladder stone), he will need cystolithalopaxy for the bladder stone in a couple of weeks when more recovered from his acute illness.    . -Urine cultures pending, continue with IV Rocephin   COVID-19 pneumonia -Did receive vaccine 2/18 . -Continue with IV remdesivir . -Continue with steroids .  AKI on CKD stage IV, likely placated with left-sided obstructive uropathy -As per family's wish no dialysis -Continue with gentle hydration and avoid nephrotoxic medications  Left obstructive uropathy As above, continue Flomax and finasteride Urology on board  IDDM Lantus plus sliding scale   COVID-19 Labs  Recent Labs    01/01/20 0821 01/02/20 0346  DDIMER 2.32* 1.68*  FERRITIN  --  394*  LDH 334*  --   CRP  --  7.3*    Lab Results  Component Value Date   SARSCOV2NAA POSITIVE (A) 01/01/2020     Code Status : DNR  Family Communication  : D/W SON  Disposition Plan  : [facility]  Barriers For Discharge : Remains on IV antibiotics, IV remdesivir, still n.p.o.  Consults  : Urology  Procedures  :    1.  Cystoscopy with left ureteral catheterization insertion of Foley catheter. 2.  Application of fluoroscopy.  DVT Prophylaxis  :    Lab  Results  Component Value Date   PLT 211 01/02/2020    Antibiotics  :    Anti-infectives (From admission, onward)   Start     Dose/Rate Route Frequency Ordered Stop   01/02/20 1000  remdesivir 100 mg in sodium chloride 0.9 % 100 mL IVPB  Status:  Discontinued     100 mg 200 mL/hr over 30 Minutes Intravenous Daily 01/01/20 1243 01/01/20 1325   01/02/20 1000  cefTRIAXone (ROCEPHIN) 1 g in sodium chloride 0.9 % 100 mL IVPB     1 g 200 mL/hr over 30 Minutes Intravenous Every 24 hours 01/01/20 1244 01/14/2020 0959   01/02/20 1000  remdesivir 100 mg in sodium chloride 0.9 % 100 mL IVPB     100 mg 200 mL/hr over 30 Minutes Intravenous Daily 01/01/20 1418 2020-01-14 0959   01/01/20 1700  cefTRIAXone (ROCEPHIN) 1 g in sodium chloride 0.9 % 100 mL IVPB  Status:  Discontinued     1 g 200 mL/hr over 30 Minutes Intravenous  Once 01/01/20 1632 01/01/20 1758   01/01/20 1430  remdesivir 200 mg in sodium chloride 0.9% 250 mL IVPB     200 mg 580 mL/hr over 30 Minutes Intravenous Once 01/01/20 1418 01/01/20 1555   01/01/20 1330  remdesivir 200 mg in sodium chloride 0.9% 250 mL IVPB  Status:  Discontinued     200 mg 580 mL/hr over 30 Minutes Intravenous Once 01/01/20 1243 01/01/20 1325   01/01/20 1100  cefTRIAXone (ROCEPHIN) 1 g in sodium chloride 0.9 % 100 mL IVPB     1 g 200 mL/hr over 30 Minutes Intravenous  Once 01/01/20 1013 01/01/20 1118        Objective:   Vitals:   01/02/20 0000 01/02/20 0415 01/02/20 0537 01/02/20 0830  BP: (!) 127/59  (!) 112/44   Pulse:  69 69 75  Resp: (!) 23 (!) 24 (!) 23 20  Temp:   97.8 F (36.6 C)  TempSrc:   Axillary   SpO2:  96% 97% 95%  Weight:  81.1 kg    Height:        Wt Readings from Last 3 Encounters:  01/02/20 81.1 kg  12/28/19 77.7 kg  12/26/19 77.7 kg     Intake/Output Summary (Last 24 hours) at 01/02/2020 1028 Last data filed at 01/02/2020 0604 Gross per 24 hour  Intake 1677.82 ml  Output 350 ml  Net 1327.82 ml     Physical  Exam  Awake, but extremely confused, weak, frail unable to answer any questions appropriately or follow any commands. Symmetrical Chest wall movement, Good air movement bilaterally, CTAB RRR,No Gallops,Rubs or new Murmurs, No Parasternal Heave +ve B.Sounds, Abd Soft, No Cyanosis, he has no abdominal tenderness,  No clubbing or edema, No new Rash or bruise      Data Review:    CBC Recent Labs  Lab 01/01/20 0821 01/02/20 0346  WBC 15.0* 9.3  HGB 10.7* 8.5*  HCT 33.9* 27.2*  PLT 233 211  MCV 94.2 94.1  MCH 29.7 29.4  MCHC 31.6 31.3  RDW 14.1 14.1  LYMPHSABS 0.8 0.6*  MONOABS 0.7 0.5  EOSABS 0.0 0.0  BASOSABS 0.0 0.0    Chemistries  Recent Labs  Lab 12/27/19 0000 01/01/20 0821 01/02/20 0346  NA 142 148* 154*  K 3.4 5.4* 4.2  CL 100 112* 118*  CO2 26* 19* 19*  GLUCOSE  --  236* 257*  BUN 102* 201* 177*  CREATININE 2.9* 4.24* 3.67*  CALCIUM 8.6* 10.0 9.2  MG  --   --  2.4  AST  --  56* 35  ALT  --  63* 45*  ALKPHOS  --  40 34*  BILITOT  --  1.1 0.8   ------------------------------------------------------------------------------------------------------------------ Recent Labs    01/01/20 0821  TRIG 300*    Lab Results  Component Value Date   HGBA1C 8.2 (H) 01/01/2020   ------------------------------------------------------------------------------------------------------------------ No results for input(s): TSH, T4TOTAL, T3FREE, THYROIDAB in the last 72 hours.  Invalid input(s): FREET3 ------------------------------------------------------------------------------------------------------------------ Recent Labs    01/02/20 0346  FERRITIN 394*    Coagulation profile No results for input(s): INR, PROTIME in the last 168 hours.  Recent Labs    01/01/20 0821 01/02/20 0346  DDIMER 2.32* 1.68*    Cardiac Enzymes No results for input(s): CKMB, TROPONINI, MYOGLOBIN in the last 168 hours.  Invalid input(s):  CK ------------------------------------------------------------------------------------------------------------------    Component Value Date/Time   BNP 1,023.7 (H) 01/13/2018 1839    Inpatient Medications  Scheduled Meds: . albuterol  2 puff Inhalation Q6H  . vitamin C  500 mg Oral Daily  . aspirin EC  81 mg Oral Daily  . Chlorhexidine Gluconate Cloth  6 each Topical Daily  . cholecalciferol  1,000 Units Oral Daily  . dexamethasone  6 mg Oral Daily  . finasteride  5 mg Oral Daily  . heparin  5,000 Units Subcutaneous Q12H  . insulin aspart  0-15 Units Subcutaneous TID WC  . insulin aspart  0-5 Units Subcutaneous QHS  . insulin glargine  12 Units Subcutaneous QHS  . isosorbide mononitrate  30 mg Oral Daily  . latanoprost  1 drop Both Eyes QHS  . metoprolol tartrate  12.5 mg Oral BID  . multivitamin with minerals  1 tablet Oral Daily  . pantoprazole  40 mg Oral Daily  . phenol  2 spray Mouth/Throat TID  . rosuvastatin  10 mg Oral Q M,W,F  . tamsulosin  0.4 mg Oral QHS  .  vitamin B-12  1,000 mcg Oral Daily  . zinc sulfate  220 mg Oral Daily   Continuous Infusions: . sodium chloride 50 mL/hr at 01/01/20 1342  . cefTRIAXone (ROCEPHIN)  IV    . remdesivir 100 mg in NS 100 mL     PRN Meds:.acetaminophen **OR** acetaminophen, bisacodyl, guaiFENesin-dextromethorphan, hydrALAZINE, HYDROmorphone (DILAUDID) injection, ondansetron **OR** ondansetron (ZOFRAN) IV, polyethylene glycol  Micro Results Recent Results (from the past 240 hour(s))  Respiratory Panel by RT PCR (Flu A&B, Covid) - Nasopharyngeal Swab     Status: Abnormal   Collection Time: 01/01/20  8:27 AM   Specimen: Nasopharyngeal Swab  Result Value Ref Range Status   SARS Coronavirus 2 by RT PCR POSITIVE (A) NEGATIVE Final    Comment: RESULT CALLED TO, READ BACK BY AND VERIFIED WITH: Talbert Forest RN 11:50 01/01/20 ([email or web address]) (NOTE) SARS-CoV-2 target nucleic acids are DETECTED. SARS-CoV-2 RNA is generally detectable in upper  respiratory specimens  during the acute phase of infection. Positive results are indicative of the presence of the identified virus, but do not rule out bacterial infection or co-infection with other pathogens not detected by the test. Clinical correlation with patient history and other diagnostic information is necessary to determine patient infection status. The expected result is Negative. Fact Sheet for Patients:  PinkCheek.be Fact Sheet for Healthcare Providers: GravelBags.it This test is not yet approved or cleared by the [place] FDA and  has been authorized for detection and/or diagnosis of SARS-CoV-2 by FDA under an Emergency Use Authorization (EUA).  This EUA will remain in effect (meaning this test can be used) fo r the duration of  the COVID-19 declaration under Section 564(b)(1) of the Act, 21 U.S.C. section 360bbb-3(b)(1), unless the authorization is terminated or revoked sooner.    Influenza A by PCR NEGATIVE NEGATIVE Final   Influenza B by PCR NEGATIVE NEGATIVE Final    Comment: (NOTE) The Xpert Xpress SARS-CoV-2/FLU/RSV assay is intended as an aid in  the diagnosis of influenza from Nasopharyngeal swab specimens and  should not be used as a sole basis for treatment. Nasal washings and  aspirates are unacceptable for Xpert Xpress SARS-CoV-2/FLU/RSV  testing. Fact Sheet for Patients: PinkCheek.be Fact Sheet for Healthcare Providers: GravelBags.it This test is not yet approved or cleared by the [place] FDA and  has been authorized for detection and/or diagnosis of SARS-CoV-2 by  FDA under an Emergency Use Authorization (EUA). This EUA will remain  in effect (meaning this test can be used) for the duration of the  Covid-19 declaration under Section 564(b)(1) of the Act, 21  U.S.C. section 360bbb-3(b)(1), unless the authorization is  terminated  or revoked. Performed at [facility], [place] [address], [place], [place] [address]   Urine culture     Status: None (Preliminary result)   Collection Time: 01/01/20  9:11 AM   Specimen: Urine, Random  Result Value Ref Range Status   Specimen Description URINE, RANDOM  Final   Special Requests NONE  Final   Culture   Final    CULTURE REINCUBATED FOR BETTER GROWTH Performed at [facility], [place] [address], [place], [place] [address]    Report Status PENDING  Incomplete  Blood Culture (routine x 2)     Status: None (Preliminary result)   Collection Time: 01/01/20  9:16 AM   Specimen: BLOOD  Result Value Ref Range Status   Specimen Description BLOOD RIGHT ANTECUBITAL  Final   Special Requests   Final  BOTTLES DRAWN AEROBIC AND ANAEROBIC Blood Culture results may not be optimal due to an excessive volume of blood received in culture bottles   Culture   Final    NO GROWTH < 24 HOURS Performed at [facility] [address] [place], [place] [address]    Report Status PENDING  Incomplete  Blood Culture (routine x 2)     Status: None (Preliminary result)   Collection Time: 01/01/20  9:16 AM   Specimen: BLOOD RIGHT HAND  Result Value Ref Range Status   Specimen Description BLOOD RIGHT HAND  Final   Special Requests   Final    BOTTLES DRAWN AEROBIC AND ANAEROBIC Blood Culture results may not be optimal due to an excessive volume of blood received in culture bottles   Culture   Final    NO GROWTH < 24 HOURS Performed at [facility], [place] [address] [place], [place] [address]    Report Status PENDING  Incomplete    Radiology Reports CT ABDOMEN PELVIS WO CONTRAST  Result Date: 01/01/2020 CLINICAL DATA:  Body aches and confusion. Diagnosed with COVID 2 weeks ago. EXAM: CT CHEST, ABDOMEN AND PELVIS WITHOUT CONTRAST TECHNIQUE: Multidetector CT imaging of the chest, abdomen and pelvis was performed following the standard protocol without IV contrast. COMPARISON:   Chest x-ray from same day. CT chest dated October 20, 2016. CT abdomen pelvis dated June 20, 2008. FINDINGS: CT CHEST FINDINGS Cardiovascular: Unchanged mild cardiomegaly with biatrial enlargement. No pericardial effusion. Prior CABG. No thoracic aortic aneurysm. Coronary, aortic arch, and branch vessel atherosclerotic vascular disease. Mediastinum/Nodes: No enlarged mediastinal, hilar, or axillary lymph nodes. Thyroid gland, trachea, and esophagus demonstrate no significant findings. Lungs/Pleura: Peripheral ground-glass density and interstitial thickening in the right upper lobe and both lower lobes. No pleural effusion or pneumothorax. 4 mm pulmonary nodule in the right upper lobe (series 4, image 22), stable since 2017, benign. Musculoskeletal: No chest wall mass or suspicious bone lesions identified. CT ABDOMEN PELVIS FINDINGS Hepatobiliary: No focal liver abnormality. Unchanged small gallstone near the gallbladder neck. No gallbladder wall thickening or biliary dilatation. Pancreas: Unremarkable. No pancreatic ductal dilatation or surrounding inflammatory changes. Spleen: Normal in size without focal abnormality. Adrenals/Urinary Tract: The adrenal glands are unremarkable. Small bilateral renal cysts. 12 mm calculus in the bladder at the left UVJ with resultant moderate left hydroureteronephrosis. Multiple bladder diverticula. Stomach/Bowel: Stomach is within normal limits. Appendix not identified. No evidence of bowel wall thickening, distention, or inflammatory changes. Left-sided colonic diverticulosis. Vascular/Lymphatic: Aortic atherosclerosis. No enlarged abdominal or pelvic lymph nodes. Reproductive: Asymmetrically enlarged prostate gland. Other: No free fluid or pneumoperitoneum. Musculoskeletal: No acute or significant osseous findings. IMPRESSION: Chest: 1. Peripheral ground-glass density and interstitial thickening in the right upper lobe and both lower lobes, consistent with COVID-19 pneumonia. 2.   Aortic atherosclerosis (ICD10-I70.0). Abdomen and pelvis: 1. 12 mm calculus in the bladder at the left UVJ with resultant moderate left hydroureteronephrosis. 2. Multiple bladder diverticula likely related to chronic outlet obstruction given asymmetrically enlarged prostate gland. Correlation with PSA is recommended given prostate asymmetry. 3. Unchanged cholelithiasis. Electronically Signed   By: Titus Dubin M.D.   On: 01/01/2020 11:40   CT Chest Wo Contrast  Result Date: 01/01/2020 CLINICAL DATA:  Body aches and confusion. Diagnosed with COVID 2 weeks ago. EXAM: CT CHEST, ABDOMEN AND PELVIS WITHOUT CONTRAST TECHNIQUE: Multidetector CT imaging of the chest, abdomen and pelvis was performed following the standard protocol without IV contrast. COMPARISON:  Chest x-ray from  same day. CT chest dated October 20, 2016. CT abdomen pelvis dated June 20, 2008. FINDINGS: CT CHEST FINDINGS Cardiovascular: Unchanged mild cardiomegaly with biatrial enlargement. No pericardial effusion. Prior CABG. No thoracic aortic aneurysm. Coronary, aortic arch, and branch vessel atherosclerotic vascular disease. Mediastinum/Nodes: No enlarged mediastinal, hilar, or axillary lymph nodes. Thyroid gland, trachea, and esophagus demonstrate no significant findings. Lungs/Pleura: Peripheral ground-glass density and interstitial thickening in the right upper lobe and both lower lobes. No pleural effusion or pneumothorax. 4 mm pulmonary nodule in the right upper lobe (series 4, image 22), stable since 2017, benign. Musculoskeletal: No chest wall mass or suspicious bone lesions identified. CT ABDOMEN PELVIS FINDINGS Hepatobiliary: No focal liver abnormality. Unchanged small gallstone near the gallbladder neck. No gallbladder wall thickening or biliary dilatation. Pancreas: Unremarkable. No pancreatic ductal dilatation or surrounding inflammatory changes. Spleen: Normal in size without focal abnormality. Adrenals/Urinary Tract: The adrenal  glands are unremarkable. Small bilateral renal cysts. 12 mm calculus in the bladder at the left UVJ with resultant moderate left hydroureteronephrosis. Multiple bladder diverticula. Stomach/Bowel: Stomach is within normal limits. Appendix not identified. No evidence of bowel wall thickening, distention, or inflammatory changes. Left-sided colonic diverticulosis. Vascular/Lymphatic: Aortic atherosclerosis. No enlarged abdominal or pelvic lymph nodes. Reproductive: Asymmetrically enlarged prostate gland. Other: No free fluid or pneumoperitoneum. Musculoskeletal: No acute or significant osseous findings. IMPRESSION: Chest: 1. Peripheral ground-glass density and interstitial thickening in the right upper lobe and both lower lobes, consistent with COVID-19 pneumonia. 2.  Aortic atherosclerosis (ICD10-I70.0). Abdomen and pelvis: 1. 12 mm calculus in the bladder at the left UVJ with resultant moderate left hydroureteronephrosis. 2. Multiple bladder diverticula likely related to chronic outlet obstruction given asymmetrically enlarged prostate gland. Correlation with PSA is recommended given prostate asymmetry. 3. Unchanged cholelithiasis. Electronically Signed   By: Titus Dubin M.D.   On: 01/01/2020 11:40   DG Chest Port 1 View  Result Date: 01/01/2020 CLINICAL DATA:  COVID.  Weakness EXAM: PORTABLE CHEST 1 VIEW COMPARISON:  09/04/2018 FINDINGS: Streaky opacity at the left base that is stable from prior. No definite acute airspace disease. No edema or effusion. No pneumothorax. Cardiomegaly with CABG. Rightward rotation distorts mediastinal contours. IMPRESSION: No acute finding when accounting for chronic infiltrate at the left base. Electronically Signed   By: Monte Fantasia M.D.   On: 01/01/2020 08:50   DG C-Arm 1-60 Min-No Report  Result Date: 01/01/2020 Fluoroscopy was utilized by the requesting physician.  No radiographic interpretation.     Phillips Climes M.D on 01/02/2020 at 10:28 AM  Between  7am to 7pm - Pager - [phone]  After 7pm go to [email or web address] - password [email or web address]  [facility] -  Office  [phone]
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
## 2020-01-02 NOTE — Progress Notes (Signed)
Initial Nutrition Assessment   RD working remotely.  DOCUMENTATION CODES:   Not applicable  INTERVENTION:  Once diet advances, provide Ensure Enlive po TID, each supplement provides 350 kcal and 20 grams of protein  RD to continue to monitor.   NUTRITION DIAGNOSIS:   Inadequate oral intake related to inability to eat as evidenced by NPO status.  GOAL:   Patient will meet greater than or equal to 90% of their needs  MONITOR:   Diet advancement, Skin, Weight trends, I & O's, Labs  REASON FOR ASSESSMENT:   Malnutrition Screening Tool    ASSESSMENT:   86 y.o. male with medical history significant of systolic CHF, CAD, diabetes, hypertension, CKD, presented from [facility] skilled nursing facility for evaluation of Covid positive and failure to thrive. Pt with worsening renal function to 4.2, as well CT scan showing groundglass opacities consistent with COVID-19 pneumonia, and 12 mm left UVJ calculi with moderate hydronephrosis, seen by urology, status post cystoscopy  Pt with acute metabolic encephalopathy with underlying dementia. Per MD, pt unable to answer questions. RD unable to obtain pt nutrition history at this time. RD to order nutritional supplements to aid in caloric and protein needs.   Unable to complete Nutrition-Focused physical exam at this time.   Labs and medications reviewed. Phosphorous elevated at 4.7.  Diet Order:   Diet Order            Diet NPO time specified Except for: Sips with Meds  Diet effective now              EDUCATION NEEDS:   Not appropriate for education at this time  Skin:  Skin Assessment: Skin Integrity Issues: Skin Integrity Issues:: Incisions Incisions: perineum  Last BM:  Unknown  Height:   Ht Readings from Last 1 Encounters:  01/01/20 5\' 8"  (1.727 m)    Weight:   Wt Readings from Last 1 Encounters:  01/02/20 81.1 kg    BMI:  Body mass index is 27.19 kg/m.  Estimated Nutritional Needs:   Kcal:   [id number]  Protein:  85-95 grams  Fluid:  >/= 1.7 L/day   Corrin Parker, MS, RD, LDN RD pager number/after hours weekend pager number on Amion.

## 2020-01-02 NOTE — Progress Notes (Signed)
1 Day Post-Op  Subjective: He is resting quietly.  Urine in foley bag is clear.   Cr has decline to 3.67.   ROS:  Review of Systems  Unable to perform ROS: Mental acuity    Anti-infectives: Anti-infectives (From admission, onward)   Start     Dose/Rate Route Frequency Ordered Stop   01/02/20 1000  remdesivir 100 mg in sodium chloride 0.9 % 100 mL IVPB  Status:  Discontinued     100 mg 200 mL/hr over 30 Minutes Intravenous Daily 01/01/20 1243 01/01/20 1325   01/02/20 1000  cefTRIAXone (ROCEPHIN) 1 g in sodium chloride 0.9 % 100 mL IVPB     1 g 200 mL/hr over 30 Minutes Intravenous Every 24 hours 01/01/20 1244 2020/01/31 0959   01/02/20 1000  remdesivir 100 mg in sodium chloride 0.9 % 100 mL IVPB     100 mg 200 mL/hr over 30 Minutes Intravenous Daily 01/01/20 1418 01-31-20 0959   01/01/20 1700  cefTRIAXone (ROCEPHIN) 1 g in sodium chloride 0.9 % 100 mL IVPB  Status:  Discontinued     1 g 200 mL/hr over 30 Minutes Intravenous  Once 01/01/20 1632 01/01/20 1758   01/01/20 1430  remdesivir 200 mg in sodium chloride 0.9% 250 mL IVPB     200 mg 580 mL/hr over 30 Minutes Intravenous Once 01/01/20 1418 01/01/20 1555   01/01/20 1330  remdesivir 200 mg in sodium chloride 0.9% 250 mL IVPB  Status:  Discontinued     200 mg 580 mL/hr over 30 Minutes Intravenous Once 01/01/20 1243 01/01/20 1325   01/01/20 1100  cefTRIAXone (ROCEPHIN) 1 g in sodium chloride 0.9 % 100 mL IVPB     1 g 200 mL/hr over 30 Minutes Intravenous  Once 01/01/20 1013 01/01/20 1118      Current Facility-Administered Medications  Medication Dose Route Frequency Provider Last Rate Last Admin  . 0.9 %  sodium chloride infusion   Intravenous Continuous Lequita Halt, MD 50 mL/hr at 01/01/20 1342 Rate Verify at 01/01/20 1342  . acetaminophen (TYLENOL) tablet 650 mg  650 mg Oral Q6H PRN Wynetta Fines T, MD       Or  . acetaminophen (TYLENOL) suppository 650 mg  650 mg Rectal Q6H PRN Wynetta Fines T, MD      . albuterol (VENTOLIN  HFA) 108 (90 Base) MCG/ACT inhaler 2 puff  2 puff Inhalation Q6H Wynetta Fines T, MD   2 puff at 01/02/20 0200  . ascorbic acid (VITAMIN C) tablet 500 mg  500 mg Oral Daily Wynetta Fines T, MD   500 mg at 01/01/20 1343  . aspirin EC tablet 81 mg  81 mg Oral Daily Wynetta Fines T, MD   81 mg at 01/01/20 1342  . bisacodyl (DULCOLAX) suppository 10 mg  10 mg Rectal Daily PRN Wynetta Fines T, MD      . cefTRIAXone (ROCEPHIN) 1 g in sodium chloride 0.9 % 100 mL IVPB  1 g Intravenous Q24H Wynetta Fines T, MD   1 g at 01/01/20 1644  . Chlorhexidine Gluconate Cloth 2 % PADS 6 each  6 each Topical Daily Wynetta Fines T, MD      . cholecalciferol (VITAMIN D3) tablet 1,000 Units  1,000 Units Oral Daily Lequita Halt, MD   1,000 Units at 01/01/20 1343  . dexamethasone (DECADRON) tablet 6 mg  6 mg Oral Daily Wynetta Fines T, MD      . finasteride (PROSCAR) tablet 5 mg  5 mg Oral  Daily Wynetta Fines T, MD      . guaiFENesin-dextromethorphan [facility] DM) 100-10 MG/5ML syrup 10 mL  10 mL Oral Q4H PRN Wynetta Fines T, MD      . heparin injection 5,000 Units  5,000 Units Subcutaneous Q12H Wynetta Fines T, MD   5,000 Units at 01/01/20 2236  . hydrALAZINE (APRESOLINE) tablet 25 mg  25 mg Oral Q6H PRN Wynetta Fines T, MD      . HYDROmorphone (DILAUDID) injection 0.5 mg  0.5 mg Intravenous Q4H PRN Wynetta Fines T, MD      . insulin aspart (novoLOG) injection 0-15 Units  0-15 Units Subcutaneous TID WC Wynetta Fines T, MD      . insulin aspart (novoLOG) injection 0-5 Units  0-5 Units Subcutaneous QHS Lequita Halt, MD   3 Units at 01/01/20 2236  . insulin glargine (LANTUS) injection 12 Units  12 Units Subcutaneous QHS Lequita Halt, MD   12 Units at 01/01/20 2237  . isosorbide mononitrate (IMDUR) 24 hr tablet 30 mg  30 mg Oral Daily Wynetta Fines T, MD   30 mg at 01/01/20 1343  . latanoprost (XALATAN) 0.005 % ophthalmic solution 1 drop  1 drop Both Eyes QHS Wynetta Fines T, MD      . metoprolol tartrate (LOPRESSOR) tablet 12.5 mg  12.5 mg Oral BID  Wynetta Fines T, MD   12.5 mg at 01/01/20 2237  . multivitamin with minerals tablet 1 tablet  1 tablet Oral Daily Lequita Halt, MD   1 tablet at 01/01/20 1343  . ondansetron (ZOFRAN) tablet 4 mg  4 mg Oral Q6H PRN Wynetta Fines T, MD       Or  . ondansetron [facility]) injection 4 mg  4 mg Intravenous Q6H PRN Wynetta Fines T, MD      . pantoprazole (PROTONIX) EC tablet 40 mg  40 mg Oral Daily Wynetta Fines T, MD   40 mg at 01/01/20 1341  . phenol (CHLORASEPTIC) mouth spray 2 spray  2 spray Mouth/Throat TID Wynetta Fines T, MD      . polyethylene glycol (MIRALAX / GLYCOLAX) packet 17 g  17 g Oral Daily PRN Wynetta Fines T, MD      . remdesivir 100 mg in sodium chloride 0.9 % 100 mL IVPB  100 mg Intravenous Daily Wynetta Fines T, MD      . rosuvastatin (CRESTOR) tablet 10 mg  10 mg Oral Q M,W,F Wynetta Fines T, MD   10 mg at 01/01/20 1342  . tamsulosin (FLOMAX) capsule 0.4 mg  0.4 mg Oral QHS Wynetta Fines T, MD   0.4 mg at 01/01/20 2239  . vitamin B-12 (CYANOCOBALAMIN) tablet 1,000 mcg  1,000 mcg Oral Daily Wynetta Fines T, MD   1,000 mcg at 01/01/20 1340  . zinc sulfate capsule 220 mg  220 mg Oral Daily Wynetta Fines T, MD   220 mg at 01/01/20 1341     Objective: Vital signs in last 24 hours: Temp:  [97.3 F (36.3 C)-98.8 F (37.1 C)] 97.8 F (36.6 C) (02/16 0537) Pulse Rate:  [59-86] 69 (02/16 0537) Resp:  [18-28] 23 (02/16 0537) BP: (108-143)/(44-91) 112/44 (02/16 0537) SpO2:  [92 %-100 %] 97 % (02/16 0537) Weight:  [81.1 kg-81.2 kg] 81.1 kg (02/16 0415)  Intake/Output from previous day: 02/15 0701 - 02/16 0700 In: 1677.8 [I.V.:827.8; IV Piggyback:850] Out: 350 [Urine:350] Intake/Output this shift: No intake/output data recorded.   Physical Exam Vitals reviewed.  Constitutional:  Appearance: He is well-developed.  Genitourinary:    Comments: Urine clear in foley.  Neurological:     Mental Status: He is alert.     Lab Results:  Recent Labs    01/01/20 0821 01/02/20 0346  WBC 15.0*  9.3  HGB 10.7* 8.5*  HCT 33.9* 27.2*  PLT 233 211   BMET Recent Labs    01/01/20 0821 01/02/20 0346  NA 148* 154*  K 5.4* 4.2  CL 112* 118*  CO2 19* 19*  GLUCOSE 236* 257*  BUN 201* 177*  CREATININE 4.24* 3.67*  CALCIUM 10.0 9.2   PT/INR No results for input(s): LABPROT, INR in the last 72 hours. ABG No results for input(s): PHART, HCO3 in the last 72 hours.  Invalid input(s): PCO2, PO2  Studies/Results: CT ABDOMEN PELVIS WO CONTRAST  Result Date: 01/01/2020 CLINICAL DATA:  Body aches and confusion. Diagnosed with COVID 2 weeks ago. EXAM: CT CHEST, ABDOMEN AND PELVIS WITHOUT CONTRAST TECHNIQUE: Multidetector CT imaging of the chest, abdomen and pelvis was performed following the standard protocol without IV contrast. COMPARISON:  Chest x-ray from same day. CT chest dated October 20, 2016. CT abdomen pelvis dated June 20, 2008. FINDINGS: CT CHEST FINDINGS Cardiovascular: Unchanged mild cardiomegaly with biatrial enlargement. No pericardial effusion. Prior CABG. No thoracic aortic aneurysm. Coronary, aortic arch, and branch vessel atherosclerotic vascular disease. Mediastinum/Nodes: No enlarged mediastinal, hilar, or axillary lymph nodes. Thyroid gland, trachea, and esophagus demonstrate no significant findings. Lungs/Pleura: Peripheral ground-glass density and interstitial thickening in the right upper lobe and both lower lobes. No pleural effusion or pneumothorax. 4 mm pulmonary nodule in the right upper lobe (series 4, image 22), stable since 2017, benign. Musculoskeletal: No chest wall mass or suspicious bone lesions identified. CT ABDOMEN PELVIS FINDINGS Hepatobiliary: No focal liver abnormality. Unchanged small gallstone near the gallbladder neck. No gallbladder wall thickening or biliary dilatation. Pancreas: Unremarkable. No pancreatic ductal dilatation or surrounding inflammatory changes. Spleen: Normal in size without focal abnormality. Adrenals/Urinary Tract: The adrenal glands  are unremarkable. Small bilateral renal cysts. 12 mm calculus in the bladder at the left UVJ with resultant moderate left hydroureteronephrosis. Multiple bladder diverticula. Stomach/Bowel: Stomach is within normal limits. Appendix not identified. No evidence of bowel wall thickening, distention, or inflammatory changes. Left-sided colonic diverticulosis. Vascular/Lymphatic: Aortic atherosclerosis. No enlarged abdominal or pelvic lymph nodes. Reproductive: Asymmetrically enlarged prostate gland. Other: No free fluid or pneumoperitoneum. Musculoskeletal: No acute or significant osseous findings. IMPRESSION: Chest: 1. Peripheral ground-glass density and interstitial thickening in the right upper lobe and both lower lobes, consistent with COVID-19 pneumonia. 2.  Aortic atherosclerosis (ICD10-I70.0). Abdomen and pelvis: 1. 12 mm calculus in the bladder at the left UVJ with resultant moderate left hydroureteronephrosis. 2. Multiple bladder diverticula likely related to chronic outlet obstruction given asymmetrically enlarged prostate gland. Correlation with PSA is recommended given prostate asymmetry. 3. Unchanged cholelithiasis. Electronically Signed   By: Titus Dubin M.D.   On: 01/01/2020 11:40   CT Chest Wo Contrast  Result Date: 01/01/2020 CLINICAL DATA:  Body aches and confusion. Diagnosed with COVID 2 weeks ago. EXAM: CT CHEST, ABDOMEN AND PELVIS WITHOUT CONTRAST TECHNIQUE: Multidetector CT imaging of the chest, abdomen and pelvis was performed following the standard protocol without IV contrast. COMPARISON:  Chest x-ray from same day. CT chest dated October 20, 2016. CT abdomen pelvis dated June 20, 2008. FINDINGS: CT CHEST FINDINGS Cardiovascular: Unchanged mild cardiomegaly with biatrial enlargement. No pericardial effusion. Prior CABG. No thoracic aortic aneurysm. Coronary, aortic arch, and branch  vessel atherosclerotic vascular disease. Mediastinum/Nodes: No enlarged mediastinal, hilar, or axillary  lymph nodes. Thyroid gland, trachea, and esophagus demonstrate no significant findings. Lungs/Pleura: Peripheral ground-glass density and interstitial thickening in the right upper lobe and both lower lobes. No pleural effusion or pneumothorax. 4 mm pulmonary nodule in the right upper lobe (series 4, image 22), stable since 2017, benign. Musculoskeletal: No chest wall mass or suspicious bone lesions identified. CT ABDOMEN PELVIS FINDINGS Hepatobiliary: No focal liver abnormality. Unchanged small gallstone near the gallbladder neck. No gallbladder wall thickening or biliary dilatation. Pancreas: Unremarkable. No pancreatic ductal dilatation or surrounding inflammatory changes. Spleen: Normal in size without focal abnormality. Adrenals/Urinary Tract: The adrenal glands are unremarkable. Small bilateral renal cysts. 12 mm calculus in the bladder at the left UVJ with resultant moderate left hydroureteronephrosis. Multiple bladder diverticula. Stomach/Bowel: Stomach is within normal limits. Appendix not identified. No evidence of bowel wall thickening, distention, or inflammatory changes. Left-sided colonic diverticulosis. Vascular/Lymphatic: Aortic atherosclerosis. No enlarged abdominal or pelvic lymph nodes. Reproductive: Asymmetrically enlarged prostate gland. Other: No free fluid or pneumoperitoneum. Musculoskeletal: No acute or significant osseous findings. IMPRESSION: Chest: 1. Peripheral ground-glass density and interstitial thickening in the right upper lobe and both lower lobes, consistent with COVID-19 pneumonia. 2.  Aortic atherosclerosis (ICD10-I70.0). Abdomen and pelvis: 1. 12 mm calculus in the bladder at the left UVJ with resultant moderate left hydroureteronephrosis. 2. Multiple bladder diverticula likely related to chronic outlet obstruction given asymmetrically enlarged prostate gland. Correlation with PSA is recommended given prostate asymmetry. 3. Unchanged cholelithiasis. Electronically Signed   By:  Titus Dubin M.D.   On: 01/01/2020 11:40   DG Chest Port 1 View  Result Date: 01/01/2020 CLINICAL DATA:  COVID.  Weakness EXAM: PORTABLE CHEST 1 VIEW COMPARISON:  09/04/2018 FINDINGS: Streaky opacity at the left base that is stable from prior. No definite acute airspace disease. No edema or effusion. No pneumothorax. Cardiomegaly with CABG. Rightward rotation distorts mediastinal contours. IMPRESSION: No acute finding when accounting for chronic infiltrate at the left base. Electronically Signed   By: Monte Fantasia M.D.   On: 01/01/2020 08:50   DG C-Arm 1-60 Min-No Report  Result Date: 01/01/2020 Fluoroscopy was utilized by the requesting physician.  No radiographic interpretation.   Labs and notes reviewed.   Assessment and Plan: 1.  Hx of left hydro with acute on chronic renal insufficiency.  He was found to have a bladder stone that possibly had passed just prior to OR and considerable pus in the bladder with evidence of outlet obstruction.    Urine clear in foley today and Cr falling.  Maintain foley catheter drainage.   He will need cystolithalopaxy for the bladder stone in a couple of weeks when more recovered from his acute illness.          LOS: 1 day    Irine Seal 01/02/2020 [phone]

## 2020-01-02 NOTE — Progress Notes (Signed)
Patient is a long term care resident at [facility].  Percell Locus Kymani Laursen LCSW [phone]

## 2020-01-03 DIAGNOSIS — N390 Urinary tract infection, site not specified: Secondary | ICD-10-CM

## 2020-01-03 DIAGNOSIS — N132 Hydronephrosis with renal and ureteral calculous obstruction: Secondary | ICD-10-CM

## 2020-01-03 DIAGNOSIS — N189 Chronic kidney disease, unspecified: Secondary | ICD-10-CM

## 2020-01-03 DIAGNOSIS — R4182 Altered mental status, unspecified: Secondary | ICD-10-CM

## 2020-01-03 DIAGNOSIS — N179 Acute kidney failure, unspecified: Secondary | ICD-10-CM

## 2020-01-03 LAB — CBC WITH DIFFERENTIAL/PLATELET
Abs Immature Granulocytes: 0.24 10*3/uL — ABNORMAL HIGH (ref 0.00–0.07)
Basophils Absolute: 0 10*3/uL (ref 0.0–0.1)
Basophils Relative: 0 %
Eosinophils Absolute: 0 10*3/uL (ref 0.0–0.5)
Eosinophils Relative: 0 %
HCT: 29.7 % — ABNORMAL LOW (ref 39.0–52.0)
Hemoglobin: 9.5 g/dL — ABNORMAL LOW (ref 13.0–17.0)
Immature Granulocytes: 3 %
Lymphocytes Relative: 10 %
Lymphs Abs: 0.9 10*3/uL (ref 0.7–4.0)
MCH: 29.9 pg (ref 26.0–34.0)
MCHC: 32 g/dL (ref 30.0–36.0)
MCV: 93.4 fL (ref 80.0–100.0)
Monocytes Absolute: 0.5 10*3/uL (ref 0.1–1.0)
Monocytes Relative: 6 %
Neutro Abs: 7.5 10*3/uL (ref 1.7–7.7)
Neutrophils Relative %: 81 %
Platelets: UNDETERMINED 10*3/uL (ref 150–400)
RBC: 3.18 MIL/uL — ABNORMAL LOW (ref 4.22–5.81)
RDW: 14.4 % (ref 11.5–15.5)
WBC: 9.2 10*3/uL (ref 4.0–10.5)
nRBC: 0.5 % — ABNORMAL HIGH (ref 0.0–0.2)

## 2020-01-03 LAB — URINE CULTURE: Culture: >=100000 — AB

## 2020-01-03 LAB — FERRITIN: Ferritin: 308 ng/mL (ref 24–336)

## 2020-01-03 LAB — COMPREHENSIVE METABOLIC PANEL
ALT: 37 U/L (ref 0–44)
AST: 30 U/L (ref 15–41)
Albumin: 2.6 g/dL — ABNORMAL LOW (ref 3.5–5.0)
Alkaline Phosphatase: 35 U/L — ABNORMAL LOW (ref 38–126)
Anion gap: 17 — ABNORMAL HIGH (ref 5–15)
BUN: 174 mg/dL — ABNORMAL HIGH (ref 8–23)
CO2: 18 mmol/L — ABNORMAL LOW (ref 22–32)
Calcium: 9.6 mg/dL (ref 8.9–10.3)
Chloride: 130 mmol/L — ABNORMAL HIGH (ref 98–111)
Creatinine, Ser: 3.63 mg/dL — ABNORMAL HIGH (ref 0.61–1.24)
GFR calc Af Amer: 15 mL/min — ABNORMAL LOW (ref >60)
GFR calc non Af Amer: 13 mL/min — ABNORMAL LOW (ref >60)
Glucose, Bld: 201 mg/dL — ABNORMAL HIGH (ref 70–99)
Potassium: 4.1 mmol/L (ref 3.5–5.1)
Sodium: 165 mmol/L (ref 135–145)
Total Bilirubin: 1.2 mg/dL (ref 0.3–1.2)
Total Protein: 7 g/dL (ref 6.5–8.1)

## 2020-01-03 LAB — GLUCOSE, CAPILLARY
Glucose-Capillary: 187 mg/dL — ABNORMAL HIGH (ref 70–99)
Glucose-Capillary: 208 mg/dL — ABNORMAL HIGH (ref 70–99)
Glucose-Capillary: 227 mg/dL — ABNORMAL HIGH (ref 70–99)
Glucose-Capillary: 229 mg/dL — ABNORMAL HIGH (ref 70–99)
Glucose-Capillary: 238 mg/dL — ABNORMAL HIGH (ref 70–99)

## 2020-01-03 LAB — PHOSPHORUS: Phosphorus: 5.2 mg/dL — ABNORMAL HIGH (ref 2.5–4.6)

## 2020-01-03 LAB — D-DIMER, QUANTITATIVE: D-Dimer, Quant: 1.66 ug/mL-FEU — ABNORMAL HIGH (ref 0.00–0.50)

## 2020-01-03 LAB — C-REACTIVE PROTEIN: CRP: 7 mg/dL — ABNORMAL HIGH (ref <1.0)

## 2020-01-03 LAB — MAGNESIUM: Magnesium: 2.6 mg/dL — ABNORMAL HIGH (ref 1.7–2.4)

## 2020-01-03 IMAGING — DX DG CHEST 2V
2 series · 2 of 2 positions shown · non-contrast
Comparison: January 13, 2018

CLINICAL DATA: Chest pain and shortness of breath

EXAM:
CHEST - 2 VIEW

[x chest ap]
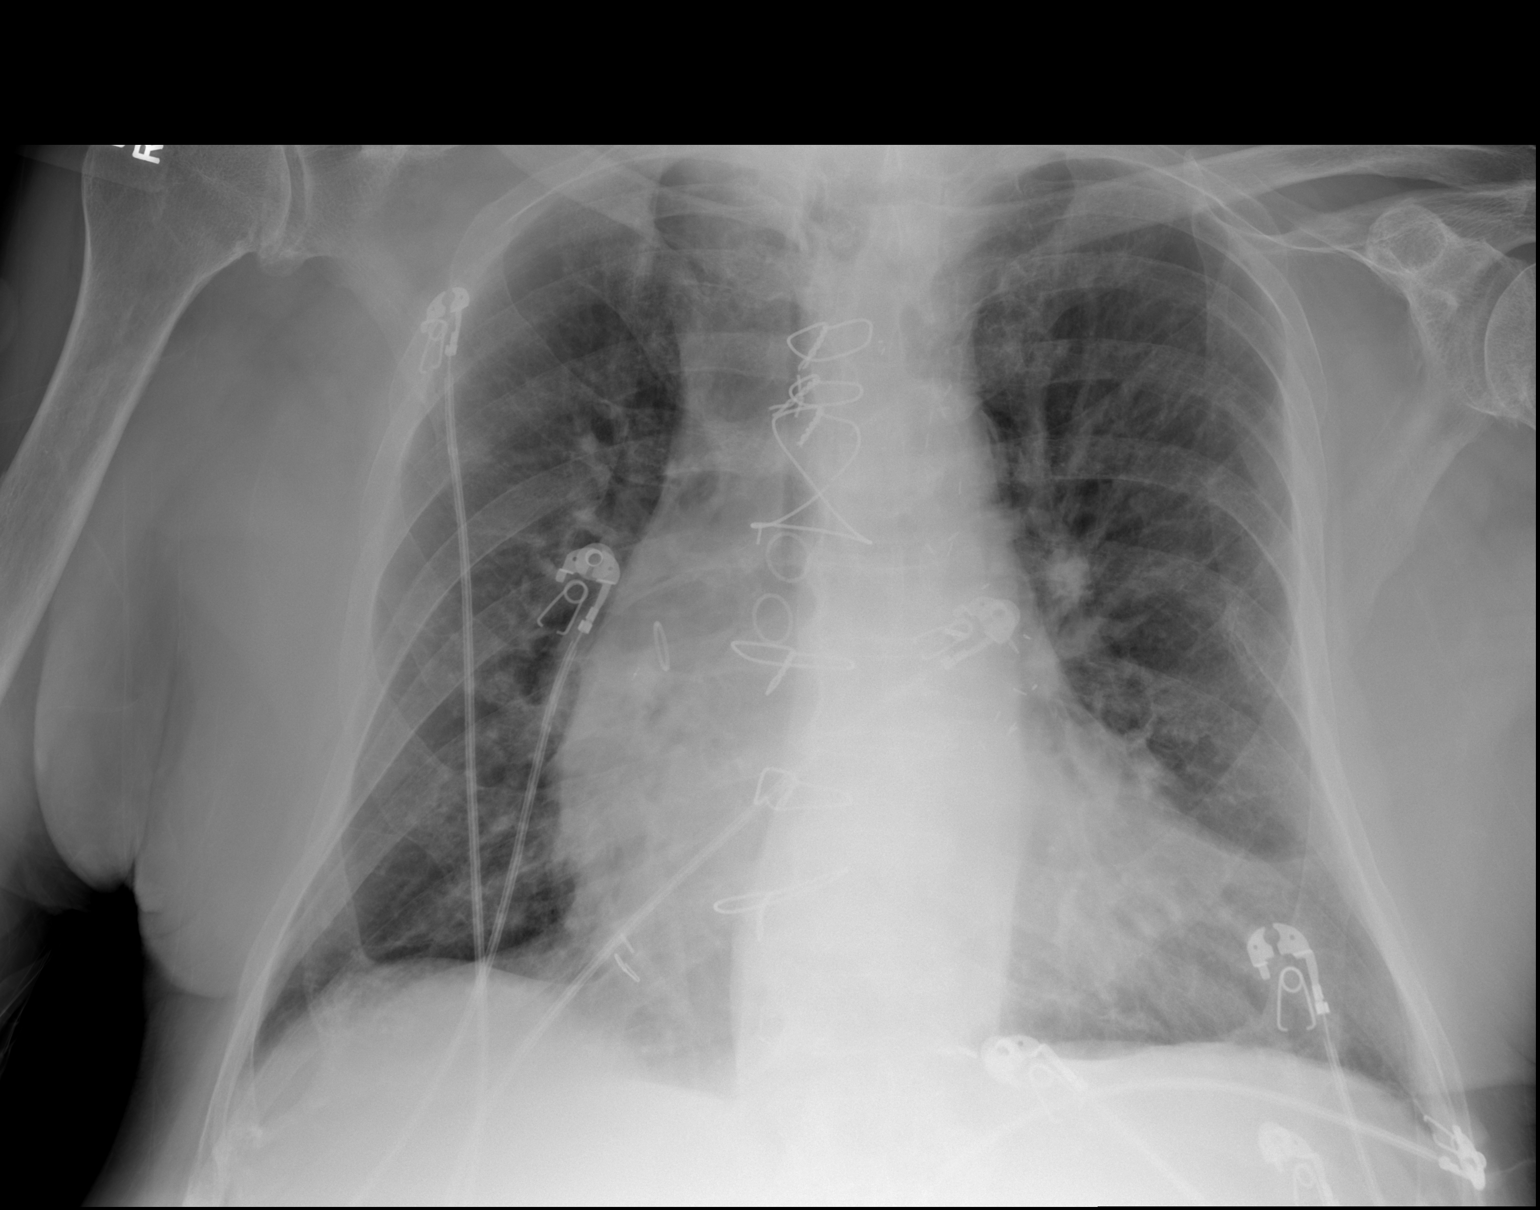

[w chest lat]
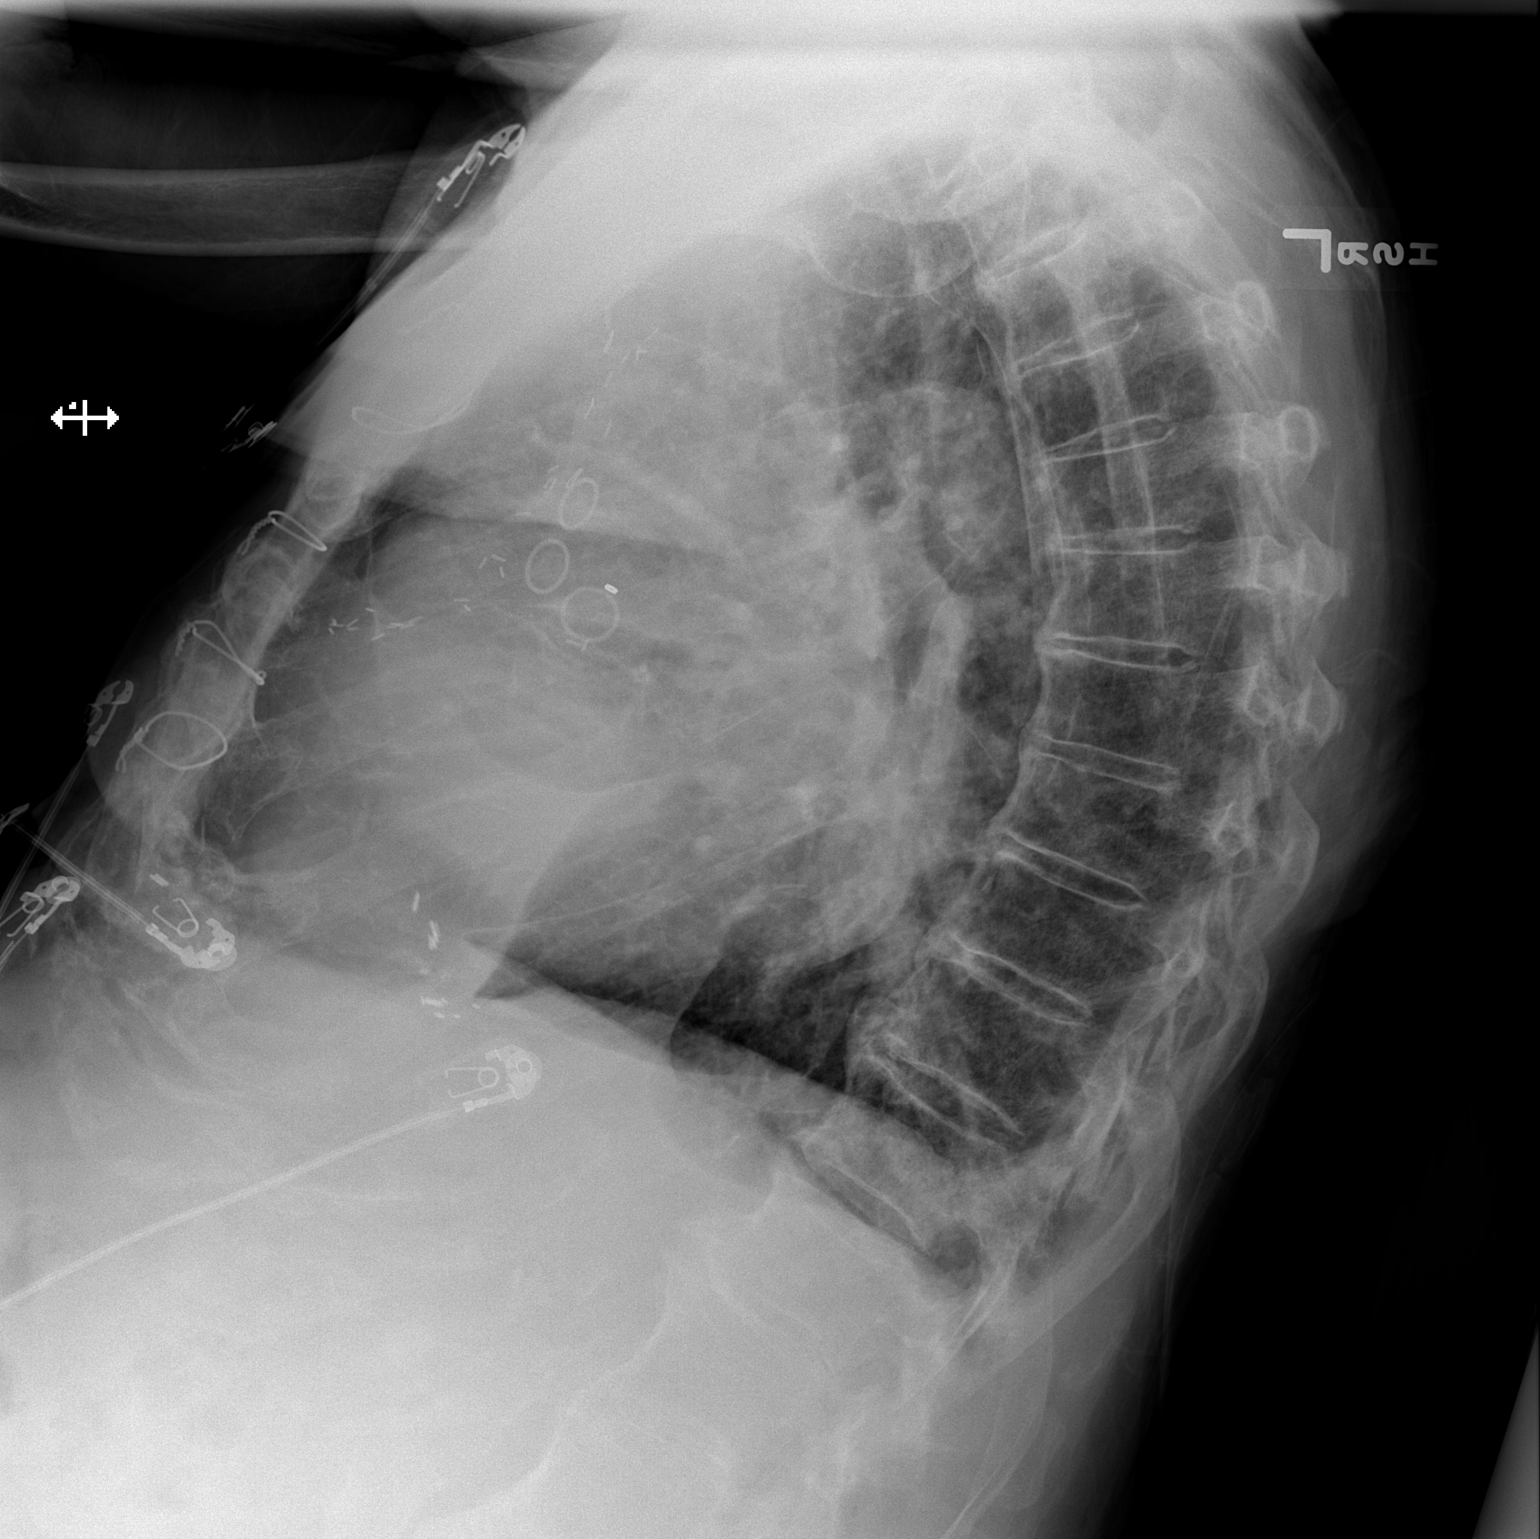

[2 of 2 positions shown; findings below may reference images not displayed]

FINDINGS: There is scarring in the left base region. There is no edema or
consolidation. Heart is borderline enlarged with pulmonary
vascularity normal. Patient is status post coronary artery bypass
grafting. No adenopathy. No bone lesions.
IMPRESSION: Scarring left base. No edema or consolidation. Stable cardiac
prominence. No evident adenopathy. Postoperative changes noted.

## 2020-01-03 MED ORDER — SODIUM CHLORIDE 0.9% FLUSH: 10.0000 mL | INTRAVENOUS | Status: DC | PRN

## 2020-01-03 MED ORDER — SODIUM CHLORIDE 0.9% FLUSH
10.0000 mL | Freq: Two times a day (BID) | INTRAVENOUS | Status: DC
  Administered 2020-01-03 – 2020-01-04 (×3): 10 mL

## 2020-01-03 MED ORDER — ENSURE ENLIVE PO LIQD: 237.0000 mL | Freq: Three times a day (TID) | ORAL | Status: DC

## 2020-01-03 MED ORDER — DEXTROSE 5 % IV SOLN: INTRAVENOUS | Status: DC

## 2020-01-03 MED ORDER — LINEZOLID 600 MG PO TABS
600.0000 mg | ORAL_TABLET | Freq: Two times a day (BID) | ORAL | Status: DC
  Filled 2020-01-03 (×4): qty 1

## 2020-01-03 NOTE — Progress Notes (Signed)
  Speech Language Pathology Treatment: Dysphagia  Patient Details Name: Cole [name] MRN: [id number] DOB: 1920/09/29 Today's Date: 01/03/2020 Time: 1140-1150 SLP Time Calculation (min) (ACUTE ONLY): 10 min  Assessment / Plan / Recommendation Clinical Impression  Pt demonstrates ongoing delirium, constantly chanting. He is however able to respond to max tactile cueing to accept ice chips and puree, though oral manipulation is still interrupted by chanting. He needed increased cueing to use a straw today; had to siphon water into his mouth to elicit labial seal and sipping action. He did eventually take several oz of water with this method without immediate signs of aspiration . After session completed and pt reclined to 30 degrees he started swallowing and appearing to have some regurgitation with coughing. Repositioned fully upright and swallowing stopped. Suspect pt at risk of postprandial aspiration as well as poor intake given mentation. At this time, continue full liquid diet with precautions.    HPI HPI: 86 y.o. male with medical history significant of systolic CHF, CAD, diabetes, hypertension, CKD, presented from [facility] skilled nursing facility for evaluation of Covid and failure to thrive.  Patient was diagnosed with Covid 2 weeks ago, before that he received 1st dose of COVID 19 vaccine on 12/04/2019.  He was treated with azithromycin, and steroid taper, patient has become increasingly confused, is not eating and drinking well, and has been complaining of generalized body aches. Dx include AKI superimposed on chronic kidney disease; hydronephrosis with renal and ureteral calculus obstruction; metabolic encephalopathy.       SLP Plan          Recommendations  Diet recommendations: Thin liquid Liquids provided via: Teaspoon Medication Administration: Crushed with puree Supervision: Full supervision/cueing for compensatory strategies                Oral Care Recommendations:  Oral care BID Follow up Recommendations: Other (comment) SLP Visit Diagnosis: Dysphagia, oropharyngeal phase (R13.12)       GO               Herbie Baltimore, MA CCC-SLP  Acute Rehabilitation Services Pager [phone] Office [phone]  Lynann Beaver 01/03/2020, 1:18 PM

## 2020-01-03 NOTE — Progress Notes (Signed)
PROGRESS NOTE                                                                                                                                                                                                             Patient Demographics:    Cole [name], is a 86 y.o. male, DOB - February 21, 1920, JK:[id number]  Outpatient Primary MD for the patient is [name], MD   Admit date - 01/01/2020   LOS - 2  Chief Complaint  Patient presents with  . Shortness of Breath  . Failure To Thrive       Brief Narrative: Patient is a 86 y.o. male with PMHx of dementia, chronic combined systolic/diastolic heart failure, CAD, DM, HTN, CKD stage IV who apparently was diagnosed with COVID-19 approximately 2 weeks prior to this hospital stay-presented to the hospital with confusion, and poor oral intake-monitor acute metabolic encephalopathy secondary to AKI with left hydronephrosis due to a distal ureteral stone.  Patient was admitted to the hospitalist service for further evaluation and treatment.  See below for further details.   Subjective:    Clarissa Acha today remains confused.  He was lying comfortably in bed.   Assessment  & Plan :   Left hydronephrosis with 1.2 cm distal ureteral stone, bladder outlet obstruction: S/p cystoscopy on 2/15-Foley catheter in place-on Flomax/finasteride-urology following.  Recommendations from urology are to keep Foley catheter in place for now.  Complicated UTI: Pus seen in the bladder on cystoscopy-urine culture positive for MRSA negative so far-on Zyvox-we will plan for at least 1 week of therapy.  Acute metabolic encephalopathy superimposed on dementia: Secondary to AKI, hypernatremia complicated UTI.  Remains very confused-continue supportive care.  AKI on CKD stage IV: AKI multifactorial-likely secondary to obstructive uropathy and some amount of prerenal physiology from poor oral intake.   Slowly improving with IV fluids.  Per prior notes-family does not desire to pursue HD if renal function worsens.  Hypernatremia: Change IVF to D5W-follow electrolytes.  Encourage oral intake if mental status permits.  Covid 19 Viral pneumonia: On 1-2 L of oxygen-inflammatory markers are mildly elevated-continue steroids/remdesivir.  Fever: afebrile  O2 requirements:  SpO2: 96 % O2 Flow Rate (L/min): 2 L/min   COVID-19 Labs: Recent Labs    01/01/20 0821 01/02/20 0346 01/03/20 0432  DDIMER 2.32* 1.68* 1.66*  FERRITIN  --  394* 308  LDH 334*  --   --   CRP  --  7.3* 7.0*       Component Value Date/Time   BNP 1,023.7 (H) 01/13/2018 1839    Recent Labs  Lab 01/01/20 0821  PROCALCITON 0.36    Lab Results  Component Value Date   SARSCOV2NAA POSITIVE (A) 01/01/2020     COVID-19 Medications: Steroids:2/15>> Remdesivir:2/15>>  DVT Prophylaxis  :Heparin  DM-2: CBG stable-continue Lantus 12 units nightly and SSI-allow some amount of permissive hyperglycemia-very frail and advanced age.  CBG (last 3)  Recent Labs    01/02/20 2119 01/03/20 0806 01/03/20 1141  GLUCAP 175* 187* 208*    Chronic combined systolic/diastolic heart failure: Volume status stable  HTN: Controlled-continue Imdur and metoprolol  Goals of care: Discussed with son and daughter over the phone-explained patient's tenuous overall clinical situation-we agreed to continue with gentle medical treatment for a few days-if he still is very tenuous-he would be appropriate for hospice care at that point.  We will follow clinical trajectory.  Nutrition Problem: Nutrition Problem: Inadequate oral intake Etiology: inability to eat Signs/Symptoms: NPO status Interventions: Refer to RD note for recommendations  Consults  :  Urology  Procedures  :  Cystoscopy with left ureteral catheterization-insertion of Foley catheter by Dr. Jeffie Pollock on 2/15  ABG:    Component Value Date/Time   PHART 7.453 (H)  01/02/2010 0902   PCO2ART 42.9 01/02/2010 0902   PO2ART 57.0 (L) 01/02/2010 0902   HCO3 30.0 (H) 01/02/2010 0902   TCO2 25 10/20/2016 0046   O2SAT 90.0 01/02/2010 0902    Vent Settings: N/A    Condition - Extremely Guarded  Family Communication  :  Son and daughter updated over the phone  Code Status : DNR  Diet :  Diet Order            Diet full liquid Room service appropriate? Yes; Fluid consistency: Thin  Diet effective now               Disposition Plan  :  Remain hospitalized-back to SNF once more stable.  Barriers to discharge: Hypoxia requiring O2 supplementation/complete 5 days of IV Remdesivir-needs improvement in renal function and hyponatremia before consideration of discharge.  Antimicorbials  :    Anti-infectives (From admission, onward)   Start     Dose/Rate Route Frequency Ordered Stop   01/03/20 1100  linezolid (ZYVOX) tablet 600 mg     600 mg Oral Every 12 hours 01/03/20 1049 01/10/20 0959   01/02/20 1000  remdesivir 100 mg in sodium chloride 0.9 % 100 mL IVPB  Status:  Discontinued     100 mg 200 mL/hr over 30 Minutes Intravenous Daily 01/01/20 1243 01/01/20 1325   01/02/20 1000  cefTRIAXone (ROCEPHIN) 1 g in sodium chloride 0.9 % 100 mL IVPB  Status:  Discontinued     1 g 200 mL/hr over 30 Minutes Intravenous Every 24 hours 01/01/20 1244 01/03/20 1049   01/02/20 1000  remdesivir 100 mg in sodium chloride 0.9 % 100 mL IVPB     100 mg 200 mL/hr over 30 Minutes Intravenous Daily 01/01/20 1418 12-Jan-2020 0959   01/01/20 1700  cefTRIAXone (ROCEPHIN) 1 g in sodium chloride 0.9 % 100 mL IVPB  Status:  Discontinued     1 g 200 mL/hr over 30 Minutes Intravenous  Once 01/01/20 1632 01/01/20 1758   01/01/20 1430  remdesivir 200 mg in sodium chloride 0.9% 250 mL IVPB     200  mg 580 mL/hr over 30 Minutes Intravenous Once 01/01/20 1418 01/01/20 1555   01/01/20 1330  remdesivir 200 mg in sodium chloride 0.9% 250 mL IVPB  Status:  Discontinued     200 mg 580  mL/hr over 30 Minutes Intravenous Once 01/01/20 1243 01/01/20 1325   01/01/20 1100  cefTRIAXone (ROCEPHIN) 1 g in sodium chloride 0.9 % 100 mL IVPB     1 g 200 mL/hr over 30 Minutes Intravenous  Once 01/01/20 1013 01/01/20 1118      Inpatient Medications  Scheduled Meds: . albuterol  2 puff Inhalation TID  . vitamin C  500 mg Oral Daily  . aspirin EC  81 mg Oral Daily  . Chlorhexidine Gluconate Cloth  6 each Topical Daily  . cholecalciferol  1,000 Units Oral Daily  . dexamethasone  6 mg Oral Daily  . feeding supplement (ENSURE ENLIVE)  237 mL Oral TID BM  . finasteride  5 mg Oral Daily  . heparin  5,000 Units Subcutaneous Q8H  . insulin aspart  0-15 Units Subcutaneous TID WC  . insulin aspart  0-5 Units Subcutaneous QHS  . insulin glargine  12 Units Subcutaneous QHS  . isosorbide mononitrate  30 mg Oral Daily  . latanoprost  1 drop Both Eyes QHS  . linezolid  600 mg Oral Q12H  . metoprolol tartrate  12.5 mg Oral BID  . multivitamin with minerals  1 tablet Oral Daily  . pantoprazole  40 mg Oral Daily  . phenol  2 spray Mouth/Throat TID  . rosuvastatin  10 mg Oral Q M,W,F  . sodium chloride flush  10-40 mL Intracatheter Q12H  . tamsulosin  0.4 mg Oral QHS  . vitamin B-12  1,000 mcg Oral Daily  . zinc sulfate  220 mg Oral Daily   Continuous Infusions: . dextrose 100 mL/hr at 01/03/20 1347  . remdesivir 100 mg in NS 100 mL 100 mg (01/03/20 0914)   PRN Meds:.acetaminophen **OR** acetaminophen, bisacodyl, guaiFENesin-dextromethorphan, hydrALAZINE, HYDROmorphone (DILAUDID) injection, ondansetron **OR** ondansetron (ZOFRAN) IV, polyethylene glycol, sodium chloride flush   Time Spent in minutes  35   See all Orders from today for further details   Oren Binet M.D on 01/03/2020 at 3:11 PM  To page go to [email or web address] - use universal password  [facility] -  Office  [phone]    Objective:   Vitals:   01/02/20 1650 01/02/20 1942 01/03/20 0321 01/03/20 1233    BP: (!) 128/48 139/66 (!) 131/51 (!) 116/96  Pulse: 67 73 76 87  Resp: 19 18 18 15   Temp: 98 F (36.7 C) 99.1 F (37.3 C) 97.9 F (36.6 C) 97.6 F (36.4 C)  TempSrc: Oral Oral Oral Oral  SpO2: 96% 100% 92% 96%  Weight:      Height:        Wt Readings from Last 3 Encounters:  01/02/20 81.1 kg  12/28/19 77.7 kg  12/26/19 77.7 kg     Intake/Output Summary (Last 24 hours) at 01/03/2020 1511 Last data filed at 01/03/2020 0900 Gross per 24 hour  Intake 900 ml  Output 550 ml  Net 350 ml     Physical Exam Gen Exam: Confused-not in any distress HEENT:atraumatic, normocephalic Chest: B/L clear to auscultation anteriorly CVS:S1S2 regular Abdomen:soft non tender, non distended Extremities:no edema Neurology: Difficult exam-but appears nonfocal Skin: no rash   Data Review:    CBC Recent Labs  Lab 01/01/20 0821 01/02/20 0346 01/03/20 0432  WBC 15.0* 9.3 9.2  HGB 10.7* 8.5*  9.5*  HCT 33.9* 27.2* 29.7*  PLT 233 211 PLATELET CLUMPS NOTED ON SMEAR, UNABLE TO ESTIMATE  MCV 94.2 94.1 93.4  MCH 29.7 29.4 29.9  MCHC 31.6 31.3 32.0  RDW 14.1 14.1 14.4  LYMPHSABS 0.8 0.6* 0.9  MONOABS 0.7 0.5 0.5  EOSABS 0.0 0.0 0.0  BASOSABS 0.0 0.0 0.0    Chemistries  Recent Labs  Lab 01/01/20 0821 01/02/20 0346 01/03/20 0432  NA 148* 154* 165*  K 5.4* 4.2 4.1  CL 112* 118* 130*  CO2 19* 19* 18*  GLUCOSE 236* 257* 201*  BUN 201* 177* 174*  CREATININE 4.24* 3.67* 3.63*  CALCIUM 10.0 9.2 9.6  MG  --  2.4 2.6*  AST 56* 35 30  ALT 63* 45* 37  ALKPHOS 40 34* 35*  BILITOT 1.1 0.8 1.2   ------------------------------------------------------------------------------------------------------------------ Recent Labs    01/01/20 0821  TRIG 300*    Lab Results  Component Value Date   HGBA1C 8.2 (H) 01/01/2020   ------------------------------------------------------------------------------------------------------------------ No results for input(s): TSH, T4TOTAL, T3FREE,  THYROIDAB in the last 72 hours.  Invalid input(s): FREET3 ------------------------------------------------------------------------------------------------------------------ Recent Labs    01/02/20 0346 01/03/20 0432  FERRITIN 394* 308    Coagulation profile No results for input(s): INR, PROTIME in the last 168 hours.  Recent Labs    01/02/20 0346 01/03/20 0432  DDIMER 1.68* 1.66*    Cardiac Enzymes No results for input(s): CKMB, TROPONINI, MYOGLOBIN in the last 168 hours.  Invalid input(s): CK ------------------------------------------------------------------------------------------------------------------    Component Value Date/Time   BNP 1,023.7 (H) 01/13/2018 1839    Micro Results Recent Results (from the past 240 hour(s))  Respiratory Panel by RT PCR (Flu A&B, Covid) - Nasopharyngeal Swab     Status: Abnormal   Collection Time: 01/01/20  8:27 AM   Specimen: Nasopharyngeal Swab  Result Value Ref Range Status   SARS Coronavirus 2 by RT PCR POSITIVE (A) NEGATIVE Final    Comment: RESULT CALLED TO, READ BACK BY AND VERIFIED WITH: Talbert Forest RN 11:50 01/01/20 ([email or web address]) (NOTE) SARS-CoV-2 target nucleic acids are DETECTED. SARS-CoV-2 RNA is generally detectable in upper respiratory specimens  during the acute phase of infection. Positive results are indicative of the presence of the identified virus, but do not rule out bacterial infection or co-infection with other pathogens not detected by the test. Clinical correlation with patient history and other diagnostic information is necessary to determine patient infection status. The expected result is Negative. Fact Sheet for Patients:  PinkCheek.be Fact Sheet for Healthcare Providers: GravelBags.it This test is not yet approved or cleared by the [place] FDA and  has been authorized for detection and/or diagnosis of SARS-CoV-2 by FDA under an Emergency Use  Authorization (EUA).  This EUA will remain in effect (meaning this test can be used) fo r the duration of  the COVID-19 declaration under Section 564(b)(1) of the Act, 21 U.S.C. section 360bbb-3(b)(1), unless the authorization is terminated or revoked sooner.    Influenza A by PCR NEGATIVE NEGATIVE Final   Influenza B by PCR NEGATIVE NEGATIVE Final    Comment: (NOTE) The Xpert Xpress SARS-CoV-2/FLU/RSV assay is intended as an aid in  the diagnosis of influenza from Nasopharyngeal swab specimens and  should not be used as a sole basis for treatment. Nasal washings and  aspirates are unacceptable for Xpert Xpress SARS-CoV-2/FLU/RSV  testing. Fact Sheet for Patients: PinkCheek.be Fact Sheet for Healthcare Providers: GravelBags.it This test is not yet approved or cleared by the [place] and  has been authorized for detection and/or diagnosis of SARS-CoV-2 by  FDA under an Emergency Use Authorization (EUA). This EUA will remain  in effect (meaning this test can be used) for the duration of the  Covid-19 declaration under Section 564(b)(1) of the Act, 21  U.S.C. section 360bbb-3(b)(1), unless the authorization is  terminated or revoked. Performed at [facility], [place] [address], [place], [place] [address]   Urine culture     Status: Abnormal   Collection Time: 01/01/20  9:11 AM   Specimen: Urine, Random  Result Value Ref Range Status   Specimen Description URINE, RANDOM  Final   Special Requests   Final    NONE Performed at [facility], [place] [address], [place], [place] [address]    Culture (A)  Final    >=100,000 COLONIES/mL METHICILLIN RESISTANT STAPHYLOCOCCUS AUREUS   Report Status 01/03/2020 FINAL  Final   Organism ID, Bacteria METHICILLIN RESISTANT STAPHYLOCOCCUS AUREUS (A)  Final      Susceptibility   Methicillin resistant staphylococcus aureus - MIC*    CIPROFLOXACIN >=8 RESISTANT Resistant      GENTAMICIN >=16 RESISTANT Resistant     NITROFURANTOIN <=16 SENSITIVE Sensitive     OXACILLIN >=4 RESISTANT Resistant     TETRACYCLINE <=1 SENSITIVE Sensitive     VANCOMYCIN <=0.5 SENSITIVE Sensitive     TRIMETH/SULFA <=10 SENSITIVE Sensitive     CLINDAMYCIN <=0.25 SENSITIVE Sensitive     RIFAMPIN <=0.5 SENSITIVE Sensitive     Inducible Clindamycin NEGATIVE Sensitive     * >=100,000 COLONIES/mL METHICILLIN RESISTANT STAPHYLOCOCCUS AUREUS  Blood Culture (routine x 2)     Status: None (Preliminary result)   Collection Time: 01/01/20  9:16 AM   Specimen: BLOOD  Result Value Ref Range Status   Specimen Description BLOOD RIGHT ANTECUBITAL  Final   Special Requests   Final    BOTTLES DRAWN AEROBIC AND ANAEROBIC Blood Culture results may not be optimal due to an excessive volume of blood received in culture bottles   Culture   Final    NO GROWTH 2 DAYS Performed at [facility], [address], [place], [place] [address]    Report Status PENDING  Incomplete  Blood Culture (routine x 2)     Status: None (Preliminary result)   Collection Time: 01/01/20  9:16 AM   Specimen: BLOOD RIGHT HAND  Result Value Ref Range Status   Specimen Description BLOOD RIGHT HAND  Final   Special Requests   Final    BOTTLES DRAWN AEROBIC AND ANAEROBIC Blood Culture results may not be optimal due to an excessive volume of blood received in culture bottles   Culture   Final    NO GROWTH 2 DAYS Performed at [facility], [place] [address], [place], [place] [address]    Report Status PENDING  Incomplete    Radiology Reports CT ABDOMEN PELVIS WO CONTRAST  Result Date: 01/01/2020 CLINICAL DATA:  Body aches and confusion. Diagnosed with COVID 2 weeks ago. EXAM: CT CHEST, ABDOMEN AND PELVIS WITHOUT CONTRAST TECHNIQUE: Multidetector CT imaging of the chest, abdomen and pelvis was performed following the standard protocol without IV contrast. COMPARISON:  Chest x-ray from same day. CT chest dated October 20, 2016. CT abdomen pelvis dated June 20, 2008. FINDINGS: CT CHEST FINDINGS Cardiovascular: Unchanged mild cardiomegaly with biatrial enlargement. No pericardial effusion. Prior CABG. No thoracic aortic aneurysm. Coronary, aortic arch, and branch vessel atherosclerotic vascular disease. Mediastinum/Nodes: No enlarged mediastinal, hilar,  or axillary lymph nodes. Thyroid gland, trachea, and esophagus demonstrate no significant findings. Lungs/Pleura: Peripheral ground-glass density and interstitial thickening in the right upper lobe and both lower lobes. No pleural effusion or pneumothorax. 4 mm pulmonary nodule in the right upper lobe (series 4, image 22), stable since 2017, benign. Musculoskeletal: No chest wall mass or suspicious bone lesions identified. CT ABDOMEN PELVIS FINDINGS Hepatobiliary: No focal liver abnormality. Unchanged small gallstone near the gallbladder neck. No gallbladder wall thickening or biliary dilatation. Pancreas: Unremarkable. No pancreatic ductal dilatation or surrounding inflammatory changes. Spleen: Normal in size without focal abnormality. Adrenals/Urinary Tract: The adrenal glands are unremarkable. Small bilateral renal cysts. 12 mm calculus in the bladder at the left UVJ with resultant moderate left hydroureteronephrosis. Multiple bladder diverticula. Stomach/Bowel: Stomach is within normal limits. Appendix not identified. No evidence of bowel wall thickening, distention, or inflammatory changes. Left-sided colonic diverticulosis. Vascular/Lymphatic: Aortic atherosclerosis. No enlarged abdominal or pelvic lymph nodes. Reproductive: Asymmetrically enlarged prostate gland. Other: No free fluid or pneumoperitoneum. Musculoskeletal: No acute or significant osseous findings. IMPRESSION: Chest: 1. Peripheral ground-glass density and interstitial thickening in the right upper lobe and both lower lobes, consistent with COVID-19 pneumonia. 2.  Aortic atherosclerosis (ICD10-I70.0). Abdomen and  pelvis: 1. 12 mm calculus in the bladder at the left UVJ with resultant moderate left hydroureteronephrosis. 2. Multiple bladder diverticula likely related to chronic outlet obstruction given asymmetrically enlarged prostate gland. Correlation with PSA is recommended given prostate asymmetry. 3. Unchanged cholelithiasis. Electronically Signed   By: Titus Dubin M.D.   On: 01/01/2020 11:40   CT Chest Wo Contrast  Result Date: 01/01/2020 CLINICAL DATA:  Body aches and confusion. Diagnosed with COVID 2 weeks ago. EXAM: CT CHEST, ABDOMEN AND PELVIS WITHOUT CONTRAST TECHNIQUE: Multidetector CT imaging of the chest, abdomen and pelvis was performed following the standard protocol without IV contrast. COMPARISON:  Chest x-ray from same day. CT chest dated October 20, 2016. CT abdomen pelvis dated June 20, 2008. FINDINGS: CT CHEST FINDINGS Cardiovascular: Unchanged mild cardiomegaly with biatrial enlargement. No pericardial effusion. Prior CABG. No thoracic aortic aneurysm. Coronary, aortic arch, and branch vessel atherosclerotic vascular disease. Mediastinum/Nodes: No enlarged mediastinal, hilar, or axillary lymph nodes. Thyroid gland, trachea, and esophagus demonstrate no significant findings. Lungs/Pleura: Peripheral ground-glass density and interstitial thickening in the right upper lobe and both lower lobes. No pleural effusion or pneumothorax. 4 mm pulmonary nodule in the right upper lobe (series 4, image 22), stable since 2017, benign. Musculoskeletal: No chest wall mass or suspicious bone lesions identified. CT ABDOMEN PELVIS FINDINGS Hepatobiliary: No focal liver abnormality. Unchanged small gallstone near the gallbladder neck. No gallbladder wall thickening or biliary dilatation. Pancreas: Unremarkable. No pancreatic ductal dilatation or surrounding inflammatory changes. Spleen: Normal in size without focal abnormality. Adrenals/Urinary Tract: The adrenal glands are unremarkable. Small bilateral renal  cysts. 12 mm calculus in the bladder at the left UVJ with resultant moderate left hydroureteronephrosis. Multiple bladder diverticula. Stomach/Bowel: Stomach is within normal limits. Appendix not identified. No evidence of bowel wall thickening, distention, or inflammatory changes. Left-sided colonic diverticulosis. Vascular/Lymphatic: Aortic atherosclerosis. No enlarged abdominal or pelvic lymph nodes. Reproductive: Asymmetrically enlarged prostate gland. Other: No free fluid or pneumoperitoneum. Musculoskeletal: No acute or significant osseous findings. IMPRESSION: Chest: 1. Peripheral ground-glass density and interstitial thickening in the right upper lobe and both lower lobes, consistent with COVID-19 pneumonia. 2.  Aortic atherosclerosis (ICD10-I70.0). Abdomen and pelvis: 1. 12 mm calculus in the bladder at the left UVJ with resultant moderate left hydroureteronephrosis. 2. Multiple  bladder diverticula likely related to chronic outlet obstruction given asymmetrically enlarged prostate gland. Correlation with PSA is recommended given prostate asymmetry. 3. Unchanged cholelithiasis. Electronically Signed   By: Titus Dubin M.D.   On: 01/01/2020 11:40   DG Chest Port 1 View  Result Date: 01/01/2020 CLINICAL DATA:  COVID.  Weakness EXAM: PORTABLE CHEST 1 VIEW COMPARISON:  09/04/2018 FINDINGS: Streaky opacity at the left base that is stable from prior. No definite acute airspace disease. No edema or effusion. No pneumothorax. Cardiomegaly with CABG. Rightward rotation distorts mediastinal contours. IMPRESSION: No acute finding when accounting for chronic infiltrate at the left base. Electronically Signed   By: Monte Fantasia M.D.   On: 01/01/2020 08:50   DG C-Arm 1-60 Min-No Report  Result Date: 01/01/2020 Fluoroscopy was utilized by the requesting physician.  No radiographic interpretation.

## 2020-01-03 NOTE — Progress Notes (Signed)
Critical NA of 165 reported to MD

## 2020-01-03 NOTE — Progress Notes (Signed)
Pt still has very poor oral intake and unable to take medications; started on D5W @ 100 ml/hr this AM,had a sip of ensure this afternoon. - No further interventions at this time

## 2020-01-03 NOTE — Progress Notes (Signed)
Nutrition Follow-up   RD working remotely.  DOCUMENTATION CODES:   Not applicable  INTERVENTION:  Provide Ensure Enlive po TID, each supplement provides 350 kcal and 20 grams of protein.  Encourage adequate PO intake.   NUTRITION DIAGNOSIS:   Inadequate oral intake related to inability to eat as evidenced by NPO status; diet advanced; progressing  GOAL:   Patient will meet greater than or equal to 90% of their needs; progressing  MONITOR:   Diet advancement, Skin, Weight trends, I & O's, Labs  REASON FOR ASSESSMENT:   Malnutrition Screening Tool    ASSESSMENT:   86 y.o. male with medical history significant of systolic CHF, CAD, diabetes, hypertension, CKD, presented from [facility] skilled nursing facility for evaluation of Covid positive and failure to thrive. Pt with worsening renal function to 4.2, as well CT scan showing groundglass opacities consistent with COVID-19 pneumonia, and 12 mm left UVJ calculi with moderate hydronephrosis, seen by urology, status post cystoscopy  Diet has been advanced to a full liquid diet. RD to order nutritional supplements to aid in caloric and protein needs.    Labs and medications reviewed. Sodium elevated at 165  (pt receiving IV fluids). Chloride elevated at 130. Phosphorous elevated at 5.2. Magnesium elevated at 2.6.  Diet Order:   Diet Order            Diet full liquid Room service appropriate? Yes; Fluid consistency: Thin  Diet effective now              EDUCATION NEEDS:   Not appropriate for education at this time  Skin:  Skin Assessment: Skin Integrity Issues: Skin Integrity Issues:: Incisions Incisions: perineum  Last BM:  Unknown  Height:   Ht Readings from Last 1 Encounters:  01/01/20 5\' 8"  (1.727 m)    Weight:   Wt Readings from Last 1 Encounters:  01/02/20 81.1 kg    BMI:  Body mass index is 27.19 kg/m.  Estimated Nutritional Needs:   Kcal:  [id number]  Protein:  85-95 grams  Fluid:  >/=  1.7 L/day   Corrin Parker, MS, RD, LDN RD pager number/after hours weekend pager number on Amion.

## 2020-01-04 LAB — CBC WITH DIFFERENTIAL/PLATELET
Abs Immature Granulocytes: 0.38 10*3/uL — ABNORMAL HIGH (ref 0.00–0.07)
Basophils Absolute: 0 10*3/uL (ref 0.0–0.1)
Basophils Relative: 0 %
Eosinophils Absolute: 0 10*3/uL (ref 0.0–0.5)
Eosinophils Relative: 0 %
HCT: 26.7 % — ABNORMAL LOW (ref 39.0–52.0)
Hemoglobin: 8.1 g/dL — ABNORMAL LOW (ref 13.0–17.0)
Immature Granulocytes: 3 %
Lymphocytes Relative: 11 %
Lymphs Abs: 1.3 10*3/uL (ref 0.7–4.0)
MCH: 29.5 pg (ref 26.0–34.0)
MCHC: 30.3 g/dL (ref 30.0–36.0)
MCV: 97.1 fL (ref 80.0–100.0)
Monocytes Absolute: 0.6 10*3/uL (ref 0.1–1.0)
Monocytes Relative: 5 %
Neutro Abs: 9.8 10*3/uL — ABNORMAL HIGH (ref 1.7–7.7)
Neutrophils Relative %: 81 %
Platelets: 275 10*3/uL (ref 150–400)
RBC: 2.75 MIL/uL — ABNORMAL LOW (ref 4.22–5.81)
RDW: 14.6 % (ref 11.5–15.5)
WBC: 12.2 10*3/uL — ABNORMAL HIGH (ref 4.0–10.5)
nRBC: 1.4 % — ABNORMAL HIGH (ref 0.0–0.2)

## 2020-01-04 LAB — COMPREHENSIVE METABOLIC PANEL
ALT: 30 U/L (ref 0–44)
AST: 24 U/L (ref 15–41)
Albumin: 2.3 g/dL — ABNORMAL LOW (ref 3.5–5.0)
Alkaline Phosphatase: 35 U/L — ABNORMAL LOW (ref 38–126)
Anion gap: 14 (ref 5–15)
BUN: 169 mg/dL — ABNORMAL HIGH (ref 8–23)
CO2: 19 mmol/L — ABNORMAL LOW (ref 22–32)
Calcium: 9 mg/dL (ref 8.9–10.3)
Chloride: 129 mmol/L — ABNORMAL HIGH (ref 98–111)
Creatinine, Ser: 3.68 mg/dL — ABNORMAL HIGH (ref 0.61–1.24)
GFR calc Af Amer: 15 mL/min — ABNORMAL LOW (ref >60)
GFR calc non Af Amer: 13 mL/min — ABNORMAL LOW (ref >60)
Glucose, Bld: 336 mg/dL — ABNORMAL HIGH (ref 70–99)
Potassium: 3.4 mmol/L — ABNORMAL LOW (ref 3.5–5.1)
Sodium: 162 mmol/L (ref 135–145)
Total Bilirubin: 1 mg/dL (ref 0.3–1.2)
Total Protein: 6.6 g/dL (ref 6.5–8.1)

## 2020-01-04 LAB — GLUCOSE, CAPILLARY
Glucose-Capillary: 289 mg/dL — ABNORMAL HIGH (ref 70–99)
Glucose-Capillary: 282 mg/dL — ABNORMAL HIGH (ref 70–99)

## 2020-01-04 LAB — D-DIMER, QUANTITATIVE: D-Dimer, Quant: 1.1 ug/mL-FEU — ABNORMAL HIGH (ref 0.00–0.50)

## 2020-01-04 LAB — MAGNESIUM: Magnesium: 2.6 mg/dL — ABNORMAL HIGH (ref 1.7–2.4)

## 2020-01-04 LAB — PHOSPHORUS: Phosphorus: 4.5 mg/dL (ref 2.5–4.6)

## 2020-01-04 LAB — FERRITIN: Ferritin: 240 ng/mL (ref 24–336)

## 2020-01-04 LAB — C-REACTIVE PROTEIN: CRP: 8.8 mg/dL — ABNORMAL HIGH (ref <1.0)

## 2020-01-04 MED ORDER — ONDANSETRON HCL 4 MG/2ML IJ SOLN: 4.0000 mg | Freq: Four times a day (QID) | INTRAMUSCULAR | Status: DC | PRN

## 2020-01-04 MED ORDER — LORAZEPAM 2 MG/ML IJ SOLN: 1.0000 mg | INTRAMUSCULAR | Status: DC | PRN

## 2020-01-04 MED ORDER — ONDANSETRON 4 MG PO TBDP: 4.0000 mg | ORAL_TABLET | Freq: Four times a day (QID) | ORAL | Status: DC | PRN

## 2020-01-04 MED ORDER — HALOPERIDOL LACTATE 2 MG/ML PO CONC
2.0000 mg | ORAL | Status: DC | PRN
  Filled 2020-01-04: qty 1

## 2020-01-04 MED ORDER — LORAZEPAM 2 MG/ML PO CONC: 1.0000 mg | ORAL | Status: DC | PRN

## 2020-01-04 MED ORDER — LORAZEPAM 1 MG PO TABS: 1.0000 mg | ORAL_TABLET | ORAL | Status: DC | PRN

## 2020-01-04 MED ORDER — POTASSIUM CHLORIDE CRYS ER 20 MEQ PO TBCR: 40.0000 meq | EXTENDED_RELEASE_TABLET | Freq: Once | ORAL | Status: DC

## 2020-01-04 MED ORDER — HYDROMORPHONE BOLUS VIA INFUSION
1.0000 mg | INTRAVENOUS | Status: DC | PRN
  Administered 2020-01-05: 1 mg via INTRAVENOUS
  Filled 2020-01-04: qty 1

## 2020-01-04 MED ORDER — ATROPINE SULFATE 1 % OP SOLN
4.0000 [drp] | OPHTHALMIC | Status: DC | PRN
  Filled 2020-01-04: qty 2

## 2020-01-04 MED ORDER — SODIUM CHLORIDE 0.9 % IV SOLN
0.5000 mg/h | INTRAVENOUS | Status: DC
  Administered 2020-01-04: 0.5 mg/h via INTRAVENOUS
  Filled 2020-01-04: qty 5

## 2020-01-04 MED ORDER — HYDROMORPHONE HCL 1 MG/ML IJ SOLN
0.5000 mg | Freq: Once | INTRAMUSCULAR | Status: AC
  Administered 2020-01-04: 0.5 mg via INTRAVENOUS
  Filled 2020-01-04: qty 0.5

## 2020-01-04 MED ORDER — DIPHENHYDRAMINE HCL 50 MG/ML IJ SOLN: 12.5000 mg | INTRAMUSCULAR | Status: DC | PRN

## 2020-01-04 MED ORDER — HALOPERIDOL 1 MG PO TABS
2.0000 mg | ORAL_TABLET | ORAL | Status: DC | PRN
  Filled 2020-01-04: qty 2

## 2020-01-04 MED ORDER — HALOPERIDOL LACTATE 5 MG/ML IJ SOLN: 2.0000 mg | INTRAMUSCULAR | Status: DC | PRN

## 2020-01-04 NOTE — Progress Notes (Addendum)
PROGRESS NOTE                                                                                                                                                                                                             Patient Demographics:    Cole [name], is a 86 y.o. male, DOB - 05/30/1920, [id number]  Outpatient Primary MD for the patient is [name], MD   Admit date - 01/01/2020   LOS - 3  Chief Complaint  Patient presents with  . Shortness of Breath  . Failure To Thrive       Brief Narrative: Patient is a 86 y.o. male with PMHx of dementia, chronic combined systolic/diastolic heart failure, CAD, DM, HTN, CKD stage IV who apparently was diagnosed with COVID-19 approximately 2 weeks prior to this hospital stay-presented to the hospital with confusion, and poor oral intake-monitor acute metabolic encephalopathy secondary to AKI with left hydronephrosis due to a distal ureteral stone.  Patient was admitted to the hospitalist service for further evaluation and treatment.  Unfortunately-even with treatment with IV fluids, antibiotics, urology evaluation with cystoscopy-patient continued to deteriorate-continue to have persistent AKI with severe hyponatremia with no oral intake.  After discussion with family-patient was transitioned to full comfort measures on 2/18.   Subjective:    Dorvin Krebs remains very confused-agitated-Per nursing staff-no oral intake for close to 2 days.   Assessment  & Plan :   Left hydronephrosis with 1.2 cm distal ureteral stone, bladder outlet obstruction: S/p cystoscopy on 2/15-Foley catheter in place-although remains relatively stable from this point of view-has developed severe AKI/hypernatremia-after discussion with family-has been transitioned to full comfort measures on 2/18.  Keep Foley in place for comfort.    Complicated UTI: Pus seen in the bladder on cystoscopy-urine culture  positive for MRSA negative so far-started on Zyvox-given continued deterioration with persistent AKI/hypernatremia/poor oral intake-transitioned to comfort measures on 2/18-stop Zyvox.   Acute metabolic encephalopathy superimposed on dementia: Secondary to AKI, hypernatremia complicated UTI.  Persistently confused--intact more agitated today-no oral intake for more than 2 days-transitioned to full comfort measures.  AKI on CKD stage IV: AKI multifactorial-likely secondary to obstructive uropathy and some amount of prerenal physiology from poor oral intake.  Renal function somewhat improved but has deteriorated again today.  Per prior notes-family did not wish to pursue HD.  Long discussion with family again on 2/18-explained continued clinical deterioration-no oral intake-family agreeable to transition to full comfort measures on 2/18.  Hypernatremia: Minimal improvement overnight-no oral intake-after discussion with family-transitioned to full comfort measures.  Covid 19 Viral pneumonia: On 1-2 L of oxygen-CRP worsening-patient has been transitioned to full comfort measures-discontinue steroids/remdesivir  Fever: afebrile  O2 requirements:  SpO2: 96 % O2 Flow Rate (L/min): 2 L/min   COVID-19 Labs: Recent Labs    01/02/20 0346 01/03/20 0432 01/04/20 0423 01/04/20 1133  DDIMER 1.68* 1.66* 1.10*  --   FERRITIN 394* 308  --  240  CRP 7.3* 7.0*  --  8.8*       Component Value Date/Time   BNP 1,023.7 (H) 01/13/2018 1839    Recent Labs  Lab 01/01/20 0821  PROCALCITON 0.36    Lab Results  Component Value Date   SARSCOV2NAA POSITIVE (A) 01/01/2020     COVID-19 Medications: Steroids:2/15>>2/18 Remdesivir:2/15>>2/18  DVT Prophylaxis  : Not needed-as comfort measures  DM-2: CBGs stable-have discontinued SSI and CBG monitoring-as comfort measures in place.  CBG (last 3)  Recent Labs    01/03/20 2222 01/04/20 0804 01/04/20 1138  GLUCAP 238* 289* 282*    Chronic  combined systolic/diastolic heart failure: Volume status stable  HTN: Controlled-stop Imdur/metoprolol  Goals of care: DNR in place-spoke with son/daughter (conference call) today as well-explained continued deterioration-no oral intake-severe agitation-recommended that at this point apart from comfort measures-really no good alternative.Family understanding of the situation-after extensive discussion-have transition to full comfort measures.  Nutrition Problem: Nutrition Problem: Inadequate oral intake Etiology: inability to eat Signs/Symptoms: NPO status Interventions: Refer to RD note for recommendations  Consults  :  Urology  Procedures  :  Cystoscopy with left ureteral catheterization-insertion of Foley catheter by Dr. Jeffie Pollock on 2/15  ABG:    Component Value Date/Time   PHART 7.453 (H) 01/02/2010 0902   PCO2ART 42.9 01/02/2010 0902   PO2ART 57.0 (L) 01/02/2010 0902   HCO3 30.0 (H) 01/02/2010 0902   TCO2 25 10/20/2016 0046   O2SAT 90.0 01/02/2010 0902    Vent Settings: N/A    Condition - Extremely Guarded  Family Communication  :  Son and daughter updated over the phone on 2/18  Code Status : DNR  Diet :  Diet Order            Diet full liquid Room service appropriate? Yes; Fluid consistency: Thin  Diet effective now               Disposition Plan  :  Remain hospitalized-starting Dilaudid infusion and other comfort measures-we will follow clinical trajectory for another day or so to see if patient can be discharged to residential hospice.  If he continues to deteriorate-he may die inpatient.  Barriers to discharge: Transitioning to comfort measures-needs better symptom control-and assessment of clinical trajectory before consideration of discharge.  Antimicorbials  :    Anti-infectives (From admission, onward)   Start     Dose/Rate Route Frequency Ordered Stop   01/03/20 1100  linezolid (ZYVOX) tablet 600 mg     600 mg Oral Every 12 hours 01/03/20 1049  01/10/20 0959   01/02/20 1000  remdesivir 100 mg in sodium chloride 0.9 % 100 mL IVPB  Status:  Discontinued     100 mg 200 mL/hr over 30 Minutes Intravenous Daily 01/01/20 1243 01/01/20 1325   01/02/20 1000  cefTRIAXone (ROCEPHIN) 1 g in sodium chloride 0.9 % 100 mL IVPB  Status:  Discontinued  1 g 200 mL/hr over 30 Minutes Intravenous Every 24 hours 01/01/20 1244 01/03/20 1049   01/02/20 1000  remdesivir 100 mg in sodium chloride 0.9 % 100 mL IVPB     100 mg 200 mL/hr over 30 Minutes Intravenous Daily 01/01/20 1418 January 13, 2020 0959   01/01/20 1700  cefTRIAXone (ROCEPHIN) 1 g in sodium chloride 0.9 % 100 mL IVPB  Status:  Discontinued     1 g 200 mL/hr over 30 Minutes Intravenous  Once 01/01/20 1632 01/01/20 1758   01/01/20 1430  remdesivir 200 mg in sodium chloride 0.9% 250 mL IVPB     200 mg 580 mL/hr over 30 Minutes Intravenous Once 01/01/20 1418 01/01/20 1555   01/01/20 1330  remdesivir 200 mg in sodium chloride 0.9% 250 mL IVPB  Status:  Discontinued     200 mg 580 mL/hr over 30 Minutes Intravenous Once 01/01/20 1243 01/01/20 1325   01/01/20 1100  cefTRIAXone (ROCEPHIN) 1 g in sodium chloride 0.9 % 100 mL IVPB     1 g 200 mL/hr over 30 Minutes Intravenous  Once 01/01/20 1013 01/01/20 1118      Inpatient Medications  Scheduled Meds: . albuterol  2 puff Inhalation TID  . vitamin C  500 mg Oral Daily  . aspirin EC  81 mg Oral Daily  . Chlorhexidine Gluconate Cloth  6 each Topical Daily  . cholecalciferol  1,000 Units Oral Daily  . dexamethasone  6 mg Oral Daily  . feeding supplement (ENSURE ENLIVE)  237 mL Oral TID BM  . finasteride  5 mg Oral Daily  . heparin  5,000 Units Subcutaneous Q8H  . insulin aspart  0-15 Units Subcutaneous TID WC  . insulin aspart  0-5 Units Subcutaneous QHS  . insulin glargine  12 Units Subcutaneous QHS  . isosorbide mononitrate  30 mg Oral Daily  . latanoprost  1 drop Both Eyes QHS  . linezolid  600 mg Oral Q12H  . metoprolol tartrate  12.5 mg  Oral BID  . multivitamin with minerals  1 tablet Oral Daily  . pantoprazole  40 mg Oral Daily  . phenol  2 spray Mouth/Throat TID  . potassium chloride  40 mEq Oral Once  . rosuvastatin  10 mg Oral Q M,W,F  . sodium chloride flush  10-40 mL Intracatheter Q12H  . tamsulosin  0.4 mg Oral QHS  . vitamin B-12  1,000 mcg Oral Daily  . zinc sulfate  220 mg Oral Daily   Continuous Infusions: . dextrose 100 mL/hr at 01/04/20 0922  . remdesivir 100 mg in NS 100 mL 100 mg (01/04/20 0935)   PRN Meds:.acetaminophen **OR** acetaminophen, bisacodyl, guaiFENesin-dextromethorphan, hydrALAZINE, HYDROmorphone (DILAUDID) injection, ondansetron **OR** ondansetron (ZOFRAN) IV, polyethylene glycol, sodium chloride flush   Time Spent in minutes  35   See all Orders from today for further details   Oren Binet M.D on 01/04/2020 at 2:20 PM  To page go to [email or web address] - use universal password  [facility] -  Office  [phone]    Objective:   Vitals:   01/03/20 1233 01/03/20 2013 01/04/20 0347 01/04/20 1357  BP: (!) 116/96 136/63 (!) 146/66 (!) 128/50  Pulse: 87 81 80   Resp: 15 18 16    Temp: 97.6 F (36.4 C) 98.6 F (37 C) 98.1 F (36.7 C)   TempSrc: Oral Oral Oral   SpO2: 96% 95% 96%   Weight:   86.1 kg   Height:        Wt Readings from Last  3 Encounters:  01/04/20 86.1 kg  12/28/19 77.7 kg  12/26/19 77.7 kg     Intake/Output Summary (Last 24 hours) at 01/04/2020 1420 Last data filed at 01/04/2020 1141 Gross per 24 hour  Intake 1782.22 ml  Output 975 ml  Net 807.22 ml     Physical Exam Gen Exam: Confused-agitated. HEENT:atraumatic, normocephalic Chest: B/L clear to auscultation anteriorly-some transmitted upper airway sounds CVS:S1S2 regular Abdomen:soft non tender, non distended Extremities:no edema Neurology: Difficult exam but seems to be moving all 4 extremities. Skin: no rash   Data Review:    CBC Recent Labs  Lab 01/01/20 0821 01/02/20 0346  01/03/20 0432 01/04/20 0423  WBC 15.0* 9.3 9.2 12.2*  HGB 10.7* 8.5* 9.5* 8.1*  HCT 33.9* 27.2* 29.7* 26.7*  PLT 233 211 PLATELET CLUMPS NOTED ON SMEAR, UNABLE TO ESTIMATE 275  MCV 94.2 94.1 93.4 97.1  MCH 29.7 29.4 29.9 29.5  MCHC 31.6 31.3 32.0 30.3  RDW 14.1 14.1 14.4 14.6  LYMPHSABS 0.8 0.6* 0.9 1.3  MONOABS 0.7 0.5 0.5 0.6  EOSABS 0.0 0.0 0.0 0.0  BASOSABS 0.0 0.0 0.0 0.0    Chemistries  Recent Labs  Lab 01/01/20 0821 01/02/20 0346 01/03/20 0432 01/04/20 0423  NA 148* 154* 165* 162*  K 5.4* 4.2 4.1 3.4*  CL 112* 118* 130* 129*  CO2 19* 19* 18* 19*  GLUCOSE 236* 257* 201* 336*  BUN 201* 177* 174* 169*  CREATININE 4.24* 3.67* 3.63* 3.68*  CALCIUM 10.0 9.2 9.6 9.0  MG  --  2.4 2.6* 2.6*  AST 56* 35 30 24  ALT 63* 45* 37 30  ALKPHOS 40 34* 35* 35*  BILITOT 1.1 0.8 1.2 1.0   ------------------------------------------------------------------------------------------------------------------ No results for input(s): CHOL, HDL, LDLCALC, TRIG, CHOLHDL, LDLDIRECT in the last 72 hours.  Lab Results  Component Value Date   HGBA1C 8.2 (H) 01/01/2020   ------------------------------------------------------------------------------------------------------------------ No results for input(s): TSH, T4TOTAL, T3FREE, THYROIDAB in the last 72 hours.  Invalid input(s): FREET3 ------------------------------------------------------------------------------------------------------------------ Recent Labs    01/03/20 0432 01/04/20 1133  FERRITIN 308 240    Coagulation profile No results for input(s): INR, PROTIME in the last 168 hours.  Recent Labs    01/03/20 0432 01/04/20 0423  DDIMER 1.66* 1.10*    Cardiac Enzymes No results for input(s): CKMB, TROPONINI, MYOGLOBIN in the last 168 hours.  Invalid input(s): CK ------------------------------------------------------------------------------------------------------------------    Component Value Date/Time   BNP 1,023.7  (H) 01/13/2018 1839    Micro Results Recent Results (from the past 240 hour(s))  Respiratory Panel by RT PCR (Flu A&B, Covid) - Nasopharyngeal Swab     Status: Abnormal   Collection Time: 01/01/20  8:27 AM   Specimen: Nasopharyngeal Swab  Result Value Ref Range Status   SARS Coronavirus 2 by RT PCR POSITIVE (A) NEGATIVE Final    Comment: RESULT CALLED TO, READ BACK BY AND VERIFIED WITH: Talbert Forest RN 11:50 01/01/20 ([email or web address]) (NOTE) SARS-CoV-2 target nucleic acids are DETECTED. SARS-CoV-2 RNA is generally detectable in upper respiratory specimens  during the acute phase of infection. Positive results are indicative of the presence of the identified virus, but do not rule out bacterial infection or co-infection with other pathogens not detected by the test. Clinical correlation with patient history and other diagnostic information is necessary to determine patient infection status. The expected result is Negative. Fact Sheet for Patients:  PinkCheek.be Fact Sheet for Healthcare Providers: GravelBags.it This test is not yet approved or cleared by the [place] FDA and  has  been authorized for detection and/or diagnosis of SARS-CoV-2 by FDA under an Emergency Use Authorization (EUA).  This EUA will remain in effect (meaning this test can be used) fo r the duration of  the COVID-19 declaration under Section 564(b)(1) of the Act, 21 U.S.C. section 360bbb-3(b)(1), unless the authorization is terminated or revoked sooner.    Influenza A by PCR NEGATIVE NEGATIVE Final   Influenza B by PCR NEGATIVE NEGATIVE Final    Comment: (NOTE) The Xpert Xpress SARS-CoV-2/FLU/RSV assay is intended as an aid in  the diagnosis of influenza from Nasopharyngeal swab specimens and  should not be used as a sole basis for treatment. Nasal washings and  aspirates are unacceptable for Xpert Xpress SARS-CoV-2/FLU/RSV  testing. Fact Sheet for  Patients: PinkCheek.be Fact Sheet for Healthcare Providers: GravelBags.it This test is not yet approved or cleared by the [place] FDA and  has been authorized for detection and/or diagnosis of SARS-CoV-2 by  FDA under an Emergency Use Authorization (EUA). This EUA will remain  in effect (meaning this test can be used) for the duration of the  Covid-19 declaration under Section 564(b)(1) of the Act, 21  U.S.C. section 360bbb-3(b)(1), unless the authorization is  terminated or revoked. Performed at [facility], [place] [address] [place], [place] [address]   Urine culture     Status: Abnormal   Collection Time: 01/01/20  9:11 AM   Specimen: Urine, Random  Result Value Ref Range Status   Specimen Description URINE, RANDOM  Final   Special Requests   Final    NONE Performed at [facility], [place] [address] [place], [place] [address]    Culture (A)  Final    >=100,000 COLONIES/mL METHICILLIN RESISTANT STAPHYLOCOCCUS AUREUS   Report Status 01/03/2020 FINAL  Final   Organism ID, Bacteria METHICILLIN RESISTANT STAPHYLOCOCCUS AUREUS (A)  Final      Susceptibility   Methicillin resistant staphylococcus aureus - MIC*    CIPROFLOXACIN >=8 RESISTANT Resistant     GENTAMICIN >=16 RESISTANT Resistant     NITROFURANTOIN <=16 SENSITIVE Sensitive     OXACILLIN >=4 RESISTANT Resistant     TETRACYCLINE <=1 SENSITIVE Sensitive     VANCOMYCIN <=0.5 SENSITIVE Sensitive     TRIMETH/SULFA <=10 SENSITIVE Sensitive     CLINDAMYCIN <=0.25 SENSITIVE Sensitive     RIFAMPIN <=0.5 SENSITIVE Sensitive     Inducible Clindamycin NEGATIVE Sensitive     * >=100,000 COLONIES/mL METHICILLIN RESISTANT STAPHYLOCOCCUS AUREUS  Blood Culture (routine x 2)     Status: None (Preliminary result)   Collection Time: 01/01/20  9:16 AM   Specimen: BLOOD  Result Value Ref Range Status   Specimen Description BLOOD RIGHT ANTECUBITAL  Final   Special  Requests   Final    BOTTLES DRAWN AEROBIC AND ANAEROBIC Blood Culture results may not be optimal due to an excessive volume of blood received in culture bottles   Culture   Final    NO GROWTH 3 DAYS Performed at [facility], [address] [place], [place] [address]    Report Status PENDING  Incomplete  Blood Culture (routine x 2)     Status: None (Preliminary result)   Collection Time: 01/01/20  9:16 AM   Specimen: BLOOD RIGHT HAND  Result Value Ref Range Status   Specimen Description BLOOD RIGHT HAND  Final   Special Requests   Final    BOTTLES DRAWN AEROBIC AND ANAEROBIC Blood Culture results may not be optimal due to  an excessive volume of blood received in culture bottles   Culture   Final    NO GROWTH 3 DAYS Performed at [facility], [place] [address] [place], [place] [address]    Report Status PENDING  Incomplete    Radiology Reports CT ABDOMEN PELVIS WO CONTRAST  Result Date: 01/01/2020 CLINICAL DATA:  Body aches and confusion. Diagnosed with COVID 2 weeks ago. EXAM: CT CHEST, ABDOMEN AND PELVIS WITHOUT CONTRAST TECHNIQUE: Multidetector CT imaging of the chest, abdomen and pelvis was performed following the standard protocol without IV contrast. COMPARISON:  Chest x-ray from same day. CT chest dated October 20, 2016. CT abdomen pelvis dated June 20, 2008. FINDINGS: CT CHEST FINDINGS Cardiovascular: Unchanged mild cardiomegaly with biatrial enlargement. No pericardial effusion. Prior CABG. No thoracic aortic aneurysm. Coronary, aortic arch, and branch vessel atherosclerotic vascular disease. Mediastinum/Nodes: No enlarged mediastinal, hilar, or axillary lymph nodes. Thyroid gland, trachea, and esophagus demonstrate no significant findings. Lungs/Pleura: Peripheral ground-glass density and interstitial thickening in the right upper lobe and both lower lobes. No pleural effusion or pneumothorax. 4 mm pulmonary nodule in the right upper lobe (series 4, image 22), stable  since 2017, benign. Musculoskeletal: No chest wall mass or suspicious bone lesions identified. CT ABDOMEN PELVIS FINDINGS Hepatobiliary: No focal liver abnormality. Unchanged small gallstone near the gallbladder neck. No gallbladder wall thickening or biliary dilatation. Pancreas: Unremarkable. No pancreatic ductal dilatation or surrounding inflammatory changes. Spleen: Normal in size without focal abnormality. Adrenals/Urinary Tract: The adrenal glands are unremarkable. Small bilateral renal cysts. 12 mm calculus in the bladder at the left UVJ with resultant moderate left hydroureteronephrosis. Multiple bladder diverticula. Stomach/Bowel: Stomach is within normal limits. Appendix not identified. No evidence of bowel wall thickening, distention, or inflammatory changes. Left-sided colonic diverticulosis. Vascular/Lymphatic: Aortic atherosclerosis. No enlarged abdominal or pelvic lymph nodes. Reproductive: Asymmetrically enlarged prostate gland. Other: No free fluid or pneumoperitoneum. Musculoskeletal: No acute or significant osseous findings. IMPRESSION: Chest: 1. Peripheral ground-glass density and interstitial thickening in the right upper lobe and both lower lobes, consistent with COVID-19 pneumonia. 2.  Aortic atherosclerosis (ICD10-I70.0). Abdomen and pelvis: 1. 12 mm calculus in the bladder at the left UVJ with resultant moderate left hydroureteronephrosis. 2. Multiple bladder diverticula likely related to chronic outlet obstruction given asymmetrically enlarged prostate gland. Correlation with PSA is recommended given prostate asymmetry. 3. Unchanged cholelithiasis. Electronically Signed   By: Titus Dubin M.D.   On: 01/01/2020 11:40   CT Chest Wo Contrast  Result Date: 01/01/2020 CLINICAL DATA:  Body aches and confusion. Diagnosed with COVID 2 weeks ago. EXAM: CT CHEST, ABDOMEN AND PELVIS WITHOUT CONTRAST TECHNIQUE: Multidetector CT imaging of the chest, abdomen and pelvis was performed following the  standard protocol without IV contrast. COMPARISON:  Chest x-ray from same day. CT chest dated October 20, 2016. CT abdomen pelvis dated June 20, 2008. FINDINGS: CT CHEST FINDINGS Cardiovascular: Unchanged mild cardiomegaly with biatrial enlargement. No pericardial effusion. Prior CABG. No thoracic aortic aneurysm. Coronary, aortic arch, and branch vessel atherosclerotic vascular disease. Mediastinum/Nodes: No enlarged mediastinal, hilar, or axillary lymph nodes. Thyroid gland, trachea, and esophagus demonstrate no significant findings. Lungs/Pleura: Peripheral ground-glass density and interstitial thickening in the right upper lobe and both lower lobes. No pleural effusion or pneumothorax. 4 mm pulmonary nodule in the right upper lobe (series 4, image 22), stable since 2017, benign. Musculoskeletal: No chest wall mass or suspicious bone lesions identified. CT ABDOMEN PELVIS FINDINGS Hepatobiliary: No focal liver abnormality. Unchanged small gallstone near the gallbladder neck.  No gallbladder wall thickening or biliary dilatation. Pancreas: Unremarkable. No pancreatic ductal dilatation or surrounding inflammatory changes. Spleen: Normal in size without focal abnormality. Adrenals/Urinary Tract: The adrenal glands are unremarkable. Small bilateral renal cysts. 12 mm calculus in the bladder at the left UVJ with resultant moderate left hydroureteronephrosis. Multiple bladder diverticula. Stomach/Bowel: Stomach is within normal limits. Appendix not identified. No evidence of bowel wall thickening, distention, or inflammatory changes. Left-sided colonic diverticulosis. Vascular/Lymphatic: Aortic atherosclerosis. No enlarged abdominal or pelvic lymph nodes. Reproductive: Asymmetrically enlarged prostate gland. Other: No free fluid or pneumoperitoneum. Musculoskeletal: No acute or significant osseous findings. IMPRESSION: Chest: 1. Peripheral ground-glass density and interstitial thickening in the right upper lobe and both  lower lobes, consistent with COVID-19 pneumonia. 2.  Aortic atherosclerosis (ICD10-I70.0). Abdomen and pelvis: 1. 12 mm calculus in the bladder at the left UVJ with resultant moderate left hydroureteronephrosis. 2. Multiple bladder diverticula likely related to chronic outlet obstruction given asymmetrically enlarged prostate gland. Correlation with PSA is recommended given prostate asymmetry. 3. Unchanged cholelithiasis. Electronically Signed   By: Titus Dubin M.D.   On: 01/01/2020 11:40   DG Chest Port 1 View  Result Date: 01/01/2020 CLINICAL DATA:  COVID.  Weakness EXAM: PORTABLE CHEST 1 VIEW COMPARISON:  09/04/2018 FINDINGS: Streaky opacity at the left base that is stable from prior. No definite acute airspace disease. No edema or effusion. No pneumothorax. Cardiomegaly with CABG. Rightward rotation distorts mediastinal contours. IMPRESSION: No acute finding when accounting for chronic infiltrate at the left base. Electronically Signed   By: Monte Fantasia M.D.   On: 01/01/2020 08:50   DG C-Arm 1-60 Min-No Report  Result Date: 01/01/2020 Fluoroscopy was utilized by the requesting physician.  No radiographic interpretation.

## 2020-01-04 NOTE — Progress Notes (Signed)
Contacted son Alexandro Eltz and updated him about the patient's status and answered all questions, he requested this RN to call the patient's cousin Eliane Decree McFaden so I called BW McFaden and answered his questions as well, I informed Mr. McFaden about the unit's visitor policy and he said that he'd come here tomorrow to see the patient.

## 2020-01-04 NOTE — Progress Notes (Signed)
Md contacted @0615   regarding  pt's increasing agitation level , pulling at iv line even with green mitts in place  and yelling out   awaiting response  Will continue to monitor

## 2020-01-05 MED ORDER — LORAZEPAM 2 MG/ML IJ SOLN: 1.0000 mg | INTRAMUSCULAR | 0 refills | Status: AC | PRN

## 2020-01-05 MED ORDER — ATROPINE SULFATE 1 % OP SOLN: 4.0000 [drp] | OPHTHALMIC | 12 refills | Status: AC | PRN

## 2020-01-05 MED ORDER — HYDROMORPHONE BOLUS VIA INFUSION: 1.0000 mg | INTRAVENOUS | 0 refills | Status: AC | PRN

## 2020-01-05 MED ORDER — SODIUM CHLORIDE 0.9 % IV SOLN: 0.5000 mg/h | INTRAVENOUS | Status: AC

## 2020-01-05 NOTE — Progress Notes (Signed)
Nutrition Brief Note  Chart reviewed. Pt transitioned to full comfort measures 2/18.  No further nutrition interventions warranted at this time.  Please re-consult as needed.   Corrin Parker, MS, RD, LDN Pager # [phone] After hours/ weekend pager # [phone]
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
## 2020-01-05 NOTE — Consult Note (Signed)
   [facility] CM Inpatient Consult   01/05/2020  Cole [name] [birth year] [id number]   Patient screened for high risk score for unplanned readmission and disposition needs in the  [facility] and check for Care Management services are needed.  Patient with [facility].   Brief review of patient's medical record reveals patient is for residential hospice. Will sign off. For questions contact:   Natividad Brood, RN BSN [facility] Liaison  [phone] business mobile phone Toll free office [phone]  Fax number: [phone] [email or web address]

## 2020-01-05 NOTE — Progress Notes (Signed)
Manufacturing engineer Documentation  Liaison received referral for pt to transfer to our hospice home in [place] for EOL care. Pt has been approved and paperwork has been signed. Family is requesting transfer tomorrow morning.   - Please request pick-up at 11 am tomorrow. - Please ask nurse to call report to the Admissions Phone tomorrow- [phone] - Please ask the hospital to fax the D/C summary to the [facility] tomorrow- [phone]  Thank you for the referral.    Freddie Breech, RN

## 2020-01-05 NOTE — Discharge Summary (Addendum)
PATIENT DETAILS Name: Cole [name] Age: 86 y.o. Sex: male Date of Birth: 08/26/1920 MRN: [id number]. Admitting Physician: Lequita Halt, MD [id number], Darrick Penna, MD  Admit Date: 01/01/2020 Discharge date: 01-07-2020  Recommendations for Outpatient Follow-up:  1. Optimize comfort measures  Admitted From:  SNF  Disposition: [place]: No  Equipment/Devices: None  Discharge Condition: Hospice  CODE STATUS:  DNR  Diet recommendation:  Diet Order            Diet - low sodium heart healthy        Diet full liquid Room service appropriate? Yes; Fluid consistency: Thin  Diet effective now               Brief Summary: See H&P, Labs, Consult and Test reports for all details in brief, Patient is a 86 y.o. male with PMHx of dementia, chronic combined systolic/diastolic heart failure, CAD, DM, HTN, CKD stage IV who apparently was diagnosed with COVID-19 approximately 2 weeks prior to this hospital stay-presented to the hospital with confusion, and poor oral intake-monitor acute metabolic encephalopathy secondary to AKI with left hydronephrosis due to a distal ureteral stone.  Patient was admitted to the hospitalist service for further evaluation and treatment.  Unfortunately-even with treatment with IV fluids, antibiotics, urology evaluation with cystoscopy-patient continued to deteriorate-continue to have persistent AKI with severe hyponatremia with no oral intake.  After discussion with family-patient was transitioned to full comfort measures on 2/18.  Brief Hospital Course: Left hydronephrosis with 1.2 cm distal ureteral stone, bladder outlet obstruction: S/p cystoscopy on 2/15-Foley catheter in place-although remains relatively stable from this point of view-has developed severe AKI/hypernatremia-after discussion with family-has been transitioned to full comfort measures on 2/18.  Keep Foley in place for comfort.    Complicated UTI: Pus seen in  the bladder on cystoscopy-urine culture positive for MRSA negative so far-started on Zyvox-given continued deterioration with persistent AKI/hypernatremia/poor oral intake-transitioned to comfort measures on 2/18-stopped Zyvox.   Acute metabolic encephalopathy superimposed on dementia: Secondary to AKI, hypernatremia complicated UTI.  Persistently confused--intact more agitated today-no oral intake for more than 3-4 days-transitioned to full comfort measures.  AKI on CKD stage IV: AKI multifactorial-likely secondary to obstructive uropathy and some amount of prerenal physiology from poor oral intake.  Renal function somewhat improved but has deteriorated again today.  Per prior notes-family did not wish to pursue HD.  Long discussion with family again on 2/18-explained continued clinical deterioration-no oral intake-family agreeable to transition to full comfort measures on 2/18.  Hypernatremia: Minimal improvement overnight-no oral intake-after discussion with family-transitioned to full comfort measures.  Anemia: Secondary to CKD-worsened by acute illness.  No evidence of blood loss.  Covid 19 Viral pneumonia: On 1-2 L of oxygen-CRP worsening-patient has been transitioned to full comfort measures-discontinue steroids/remdesivir  COVID-19 Labs:  Recent Labs    01/04/20 0423 01/04/20 1133  DDIMER 1.10*  --   FERRITIN  --  240  CRP  --  8.8*    Lab Results  Component Value Date   SARSCOV2NAA POSITIVE (A) 01/01/2020     COVID-19 Medications: Steroids:2/15>>2/18 Remdesivir:2/15>>2/18  DM-2: CBGs stable-have discontinued SSI and CBG monitoring-as comfort measures in place.  Chronic combined systolic/diastolic heart failure: Volume status stable  HTN: Controlled-stop Imdur/metoprolol as comfort care  Goals of care: DNR in place-spoke with son/daughter (conference call) on 2/18-explained continued deterioration-no oral intake-severe agitation-recommended that at this point apart  from comfort measures-really no good alternative.Family understanding of the  situation-after extensive discussion-have transition to full comfort measures.  Has been started on a Dilaudid infusion-and other comfort measures.  After extensive discussion with family-patient being discharged to residential hospice.  Nutrition Problem: Nutrition Problem: Inadequate oral intake Etiology: inability to eat Signs/Symptoms: NPO status Interventions: Refer to RD note for recommendations   Procedures/Studies: Cystoscopy with left ureteral catheterization-insertion of Foley catheter by Dr. Jeffie Pollock on 2/15  Discharge Diagnoses:  Active Problems:   Acute kidney injury superimposed on chronic kidney disease (HCC)   Hydronephrosis with renal and ureteral calculus obstruction   AKI (acute kidney injury) [facility])   Discharge Instructions:    Person Under Monitoring Name: KENDELL BERDING  Location: [address] [place] [address]   Infection Prevention Recommendations for Individuals Confirmed to have, or Being Evaluated for, 2019 Novel Coronavirus (COVID-19) Infection Who Receive Care at Home  Individuals who are confirmed to have, or are being evaluated for, COVID-19 should follow the prevention steps below until a healthcare provider or local or state health department says they can return to normal activities.  Stay home except to get medical care You should restrict activities outside your home, except for getting medical care. Do not go to work, school, or public areas, and do not use public transportation or taxis.  Call ahead before visiting your doctor Before your medical appointment, call the healthcare provider and tell them that you have, or are being evaluated for, COVID-19 infection. This will help the healthcare providers office take steps to keep other people from getting infected. Ask your healthcare provider to call the local or state health department.  Monitor your  symptoms Seek prompt medical attention if your illness is worsening (e.g., difficulty breathing). Before going to your medical appointment, call the healthcare provider and tell them that you have, or are being evaluated for, COVID-19 infection. Ask your healthcare provider to call the local or state health department.  Wear a facemask You should wear a facemask that covers your nose and mouth when you are in the same room with other people and when you visit a healthcare provider. People who live with or visit you should also wear a facemask while they are in the same room with you.  Separate yourself from other people in your home As much as possible, you should stay in a different room from other people in your home. Also, you should use a separate bathroom, if available.  Avoid sharing household items You should not share dishes, drinking glasses, cups, eating utensils, towels, bedding, or other items with other people in your home. After using these items, you should wash them thoroughly with soap and water.  Cover your coughs and sneezes Cover your mouth and nose with a tissue when you cough or sneeze, or you can cough or sneeze into your sleeve. Throw used tissues in a lined trash can, and immediately wash your hands with soap and water for at least 20 seconds or use an alcohol-based hand rub.  Wash your [facility] your hands often and thoroughly with soap and water for at least 20 seconds. You can use an alcohol-based hand sanitizer if soap and water are not available and if your hands are not visibly dirty. Avoid touching your eyes, nose, and mouth with unwashed hands.   Prevention Steps for Caregivers and Household Members of Individuals Confirmed to have, or Being Evaluated for, COVID-19 Infection Being Cared for in the Home  If you live with, or provide care at home for,  a person confirmed to have, or being evaluated for, COVID-19 infection please follow these guidelines  to prevent infection:  Follow healthcare providers instructions Make sure that you understand and can help the patient follow any healthcare provider instructions for all care.  Provide for the patients basic needs You should help the patient with basic needs in the home and provide support for getting groceries, prescriptions, and other personal needs.  Monitor the patients symptoms If they are getting sicker, call his or her medical provider and tell them that the patient has, or is being evaluated for, COVID-19 infection. This will help the healthcare providers office take steps to keep other people from getting infected. Ask the healthcare provider to call the local or state health department.  Limit the number of people who have contact with the patient  If possible, have only one caregiver for the patient.  Other household members should stay in another home or place of residence. If this is not possible, they should stay  in another room, or be separated from the patient as much as possible. Use a separate bathroom, if available.  Restrict visitors who do not have an essential need to be in the home.  Keep older adults, very young children, and other sick people away from the patient Keep older adults, very young children, and those who have compromised immune systems or chronic health conditions away from the patient. This includes people with chronic heart, lung, or kidney conditions, diabetes, and cancer.  Ensure good ventilation Make sure that shared spaces in the home have good air flow, such as from an air conditioner or an opened window, weather permitting.  Wash your hands often  Wash your hands often and thoroughly with soap and water for at least 20 seconds. You can use an alcohol based hand sanitizer if soap and water are not available and if your hands are not visibly dirty.  Avoid touching your eyes, nose, and mouth with unwashed hands.  Use disposable  paper towels to dry your hands. If not available, use dedicated cloth towels and replace them when they become wet.  Wear a facemask and gloves  Wear a disposable facemask at all times in the room and gloves when you touch or have contact with the patients blood, body fluids, and/or secretions or excretions, such as sweat, saliva, sputum, nasal mucus, vomit, urine, or feces.  Ensure the mask fits over your nose and mouth tightly, and do not touch it during use.  Throw out disposable facemasks and gloves after using them. Do not reuse.  Wash your hands immediately after removing your facemask and gloves.  If your personal clothing becomes contaminated, carefully remove clothing and launder. Wash your hands after handling contaminated clothing.  Place all used disposable facemasks, gloves, and other waste in a lined container before disposing them with other household waste.  Remove gloves and wash your hands immediately after handling these items.  Do not share dishes, glasses, or other household items with the patient  Avoid sharing household items. You should not share dishes, drinking glasses, cups, eating utensils, towels, bedding, or other items with a patient who is confirmed to have, or being evaluated for, COVID-19 infection.  After the person uses these items, you should wash them thoroughly with soap and water.  Wash laundry thoroughly  Immediately remove and wash clothes or bedding that have blood, body fluids, and/or secretions or excretions, such as sweat, saliva, sputum, nasal mucus, vomit, urine, or feces, on them.  Wear gloves when handling laundry from the patient.  Read and follow directions on labels of laundry or clothing items and detergent. In general, wash and dry with the warmest temperatures recommended on the label.  Clean all areas the individual has used often  Clean all touchable surfaces, such as counters, tabletops, doorknobs, bathroom fixtures, toilets,  phones, keyboards, tablets, and bedside tables, every day. Also, clean any surfaces that may have blood, body fluids, and/or secretions or excretions on them.  Wear gloves when cleaning surfaces the patient has come in contact with.  Use a diluted bleach solution (e.g., dilute bleach with 1 part bleach and 10 parts water) or a household disinfectant with a label that says EPA-registered for coronaviruses. To make a bleach solution at home, add 1 tablespoon of bleach to 1 quart (4 cups) of water. For a larger supply, add  cup of bleach to 1 gallon (16 cups) of water.  Read labels of cleaning products and follow recommendations provided on product labels. Labels contain instructions for safe and effective use of the cleaning product including precautions you should take when applying the product, such as wearing gloves or eye protection and making sure you have good ventilation during use of the product.  Remove gloves and wash hands immediately after cleaning.  Monitor yourself for signs and symptoms of illness Caregivers and household members are considered close contacts, should monitor their health, and will be asked to limit movement outside of the home to the extent possible. Follow the monitoring steps for close contacts listed on the symptom monitoring form.   ? If you have additional questions, contact your local health department or call the epidemiologist on call at [phone] (available 24/7). ? This guidance is subject to change. For the most up-to-date guidance from [facility], please refer to their website: YouBlogs.pl    Activity:  As tolerated   Discharge Instructions    Diet - low sodium heart healthy   Complete by: As directed    Comfort feeding if tolerated   Increase activity slowly   Complete by: As directed      Allergies as of 01/22/20      Reactions   Nsaids Other (See Comments)   Contraindicated  due to advanced age with extremely high risk of GI bleeding as well as advanced CKD   Simvastatin Other (See Comments)   Unknown to pt and not listed on MAR      Medication List    STOP taking these medications   amLODipine 2.5 MG tablet Commonly known as: NORVASC   aspirin EC 81 MG tablet   Basaglar KwikPen 100 UNIT/ML Sopn   cholecalciferol 1000 units tablet Commonly known as: VITAMIN D   dexamethasone 2 MG tablet Commonly known as: DECADRON   finasteride 5 MG tablet Commonly known as: PROSCAR   furosemide 40 MG tablet Commonly known as: LASIX   hydrALAZINE 10 MG tablet Commonly known as: APRESOLINE   isosorbide mononitrate 60 MG 24 hr tablet Commonly known as: IMDUR   latanoprost 0.005 % ophthalmic solution Commonly known as: XALATAN   magnesium hydroxide 400 MG/5ML suspension Commonly known as: MILK OF MAGNESIA   magnesium oxide 400 MG tablet Commonly known as: MAG-OX   metolazone 2.5 MG tablet Commonly known as: ZAROXOLYN   metoprolol tartrate 25 MG tablet Commonly known as: LOPRESSOR   multivitamin tablet   NovoLOG FlexPen 100 UNIT/ML FlexPen Generic drug: insulin aspart   OXYGEN   pantoprazole 40 MG tablet Commonly known as: PROTONIX  phenol 1.4 % Liqd Commonly known as: CHLORASEPTIC   polyethylene glycol 17 g packet Commonly known as: MIRALAX / GLYCOLAX   potassium chloride SA 20 MEQ tablet Commonly known as: KLOR-CON   rosuvastatin 10 MG tablet Commonly known as: CRESTOR   sodium chloride 0.65 % Soln nasal spray Commonly known as: OCEAN   tamsulosin 0.4 MG Caps capsule Commonly known as: FLOMAX   vitamin B-12 1000 MCG tablet Commonly known as: CYANOCOBALAMIN     TAKE these medications   atropine 1 % ophthalmic solution Place 4 drops under the tongue every 4 (four) hours as needed (excessive secretions).   HYDROmorphone 2 mg/mL Soln Commonly known as: DILAUDID Inject 1 mg into the vein every 30 (thirty) minutes as needed  (uncontrolled pain, distress or if respiratory rate is greater than 25).   HYDROmorphone 50 mg in sodium chloride 0.9 % 95 mL Inject 0.5 mg/hr into the vein continuous.   LORazepam 2 MG/ML injection Commonly known as: ATIVAN Inject 0.5 mLs (1 mg total) into the vein every 4 (four) hours as needed for anxiety.      Follow-up Information    [name], MD Follow up.   Specialty: Internal Medicine Why: as needed Contact information: [place] [address] [phone]          Allergies  Allergen Reactions   Nsaids Other (See Comments)    Contraindicated due to advanced age with extremely high risk of GI bleeding as well as advanced CKD   Simvastatin Other (See Comments)    Unknown to pt and not listed on [facility]    Consultations:   urology  Other Procedures/Studies: CT ABDOMEN PELVIS WO CONTRAST  Result Date: 01/01/2020 CLINICAL DATA:  Body aches and confusion. Diagnosed with COVID 2 weeks ago. EXAM: CT CHEST, ABDOMEN AND PELVIS WITHOUT CONTRAST TECHNIQUE: Multidetector CT imaging of the chest, abdomen and pelvis was performed following the standard protocol without IV contrast. COMPARISON:  Chest x-ray from same day. CT chest dated October 20, 2016. CT abdomen pelvis dated June 20, 2008. FINDINGS: CT CHEST FINDINGS Cardiovascular: Unchanged mild cardiomegaly with biatrial enlargement. No pericardial effusion. Prior CABG. No thoracic aortic aneurysm. Coronary, aortic arch, and branch vessel atherosclerotic vascular disease. Mediastinum/Nodes: No enlarged mediastinal, hilar, or axillary lymph nodes. Thyroid gland, trachea, and esophagus demonstrate no significant findings. Lungs/Pleura: Peripheral ground-glass density and interstitial thickening in the right upper lobe and both lower lobes. No pleural effusion or pneumothorax. 4 mm pulmonary nodule in the right upper lobe (series 4, image 22), stable since 2017, benign. Musculoskeletal: No chest wall mass or  suspicious bone lesions identified. CT ABDOMEN PELVIS FINDINGS Hepatobiliary: No focal liver abnormality. Unchanged small gallstone near the gallbladder neck. No gallbladder wall thickening or biliary dilatation. Pancreas: Unremarkable. No pancreatic ductal dilatation or surrounding inflammatory changes. Spleen: Normal in size without focal abnormality. Adrenals/Urinary Tract: The adrenal glands are unremarkable. Small bilateral renal cysts. 12 mm calculus in the bladder at the left UVJ with resultant moderate left hydroureteronephrosis. Multiple bladder diverticula. Stomach/Bowel: Stomach is within normal limits. Appendix not identified. No evidence of bowel wall thickening, distention, or inflammatory changes. Left-sided colonic diverticulosis. Vascular/Lymphatic: Aortic atherosclerosis. No enlarged abdominal or pelvic lymph nodes. Reproductive: Asymmetrically enlarged prostate gland. Other: No free fluid or pneumoperitoneum. Musculoskeletal: No acute or significant osseous findings. IMPRESSION: Chest: 1. Peripheral ground-glass density and interstitial thickening in the right upper lobe and both lower lobes, consistent with COVID-19 pneumonia. 2.  Aortic atherosclerosis (ICD10-I70.0). Abdomen and pelvis:  1. 12 mm calculus in the bladder at the left UVJ with resultant moderate left hydroureteronephrosis. 2. Multiple bladder diverticula likely related to chronic outlet obstruction given asymmetrically enlarged prostate gland. Correlation with PSA is recommended given prostate asymmetry. 3. Unchanged cholelithiasis. Electronically Signed   By: Titus Dubin M.D.   On: 01/01/2020 11:40   CT Chest Wo Contrast  Result Date: 01/01/2020 CLINICAL DATA:  Body aches and confusion. Diagnosed with COVID 2 weeks ago. EXAM: CT CHEST, ABDOMEN AND PELVIS WITHOUT CONTRAST TECHNIQUE: Multidetector CT imaging of the chest, abdomen and pelvis was performed following the standard protocol without IV contrast. COMPARISON:  Chest  x-ray from same day. CT chest dated October 20, 2016. CT abdomen pelvis dated June 20, 2008. FINDINGS: CT CHEST FINDINGS Cardiovascular: Unchanged mild cardiomegaly with biatrial enlargement. No pericardial effusion. Prior CABG. No thoracic aortic aneurysm. Coronary, aortic arch, and branch vessel atherosclerotic vascular disease. Mediastinum/Nodes: No enlarged mediastinal, hilar, or axillary lymph nodes. Thyroid gland, trachea, and esophagus demonstrate no significant findings. Lungs/Pleura: Peripheral ground-glass density and interstitial thickening in the right upper lobe and both lower lobes. No pleural effusion or pneumothorax. 4 mm pulmonary nodule in the right upper lobe (series 4, image 22), stable since 2017, benign. Musculoskeletal: No chest wall mass or suspicious bone lesions identified. CT ABDOMEN PELVIS FINDINGS Hepatobiliary: No focal liver abnormality. Unchanged small gallstone near the gallbladder neck. No gallbladder wall thickening or biliary dilatation. Pancreas: Unremarkable. No pancreatic ductal dilatation or surrounding inflammatory changes. Spleen: Normal in size without focal abnormality. Adrenals/Urinary Tract: The adrenal glands are unremarkable. Small bilateral renal cysts. 12 mm calculus in the bladder at the left UVJ with resultant moderate left hydroureteronephrosis. Multiple bladder diverticula. Stomach/Bowel: Stomach is within normal limits. Appendix not identified. No evidence of bowel wall thickening, distention, or inflammatory changes. Left-sided colonic diverticulosis. Vascular/Lymphatic: Aortic atherosclerosis. No enlarged abdominal or pelvic lymph nodes. Reproductive: Asymmetrically enlarged prostate gland. Other: No free fluid or pneumoperitoneum. Musculoskeletal: No acute or significant osseous findings. IMPRESSION: Chest: 1. Peripheral ground-glass density and interstitial thickening in the right upper lobe and both lower lobes, consistent with COVID-19 pneumonia. 2.   Aortic atherosclerosis (ICD10-I70.0). Abdomen and pelvis: 1. 12 mm calculus in the bladder at the left UVJ with resultant moderate left hydroureteronephrosis. 2. Multiple bladder diverticula likely related to chronic outlet obstruction given asymmetrically enlarged prostate gland. Correlation with PSA is recommended given prostate asymmetry. 3. Unchanged cholelithiasis. Electronically Signed   By: Titus Dubin M.D.   On: 01/01/2020 11:40   DG Chest Port 1 View  Result Date: 01/01/2020 CLINICAL DATA:  COVID.  Weakness EXAM: PORTABLE CHEST 1 VIEW COMPARISON:  09/04/2018 FINDINGS: Streaky opacity at the left base that is stable from prior. No definite acute airspace disease. No edema or effusion. No pneumothorax. Cardiomegaly with CABG. Rightward rotation distorts mediastinal contours. IMPRESSION: No acute finding when accounting for chronic infiltrate at the left base. Electronically Signed   By: Monte Fantasia M.D.   On: 01/01/2020 08:50   DG C-Arm 1-60 Min-No Report  Result Date: 01/01/2020 Fluoroscopy was utilized by the requesting physician.  No radiographic interpretation.     TODAY-DAY OF DISCHARGE:  Subjective:   Kamar Steinbacher today appears comfortable on Dilaudid infusion.  Objective:   Blood pressure (!) 149/48, pulse 81, temperature 98.5 F (36.9 C), temperature source Oral, resp. rate 20, height 5\' 8"  (1.727 m), weight 86.1 kg, SpO2 98 %.  Intake/Output Summary (Last 24 hours) at Jan 31, 2020 0826 Last data filed at January 31, 2020 0300 Gross  per 24 hour  Intake 35.76 ml  Output --  Net 35.76 ml   Filed Weights   01/01/20 0832 01/02/20 0415 01/04/20 0347  Weight: 81.2 kg 81.1 kg 86.1 kg    Exam: Nonresponsive-comfortable-on Dilaudid infusion   PERTINENT RADIOLOGIC STUDIES: CT ABDOMEN PELVIS WO CONTRAST  Result Date: 01/01/2020 CLINICAL DATA:  Body aches and confusion. Diagnosed with COVID 2 weeks ago. EXAM: CT CHEST, ABDOMEN AND PELVIS WITHOUT CONTRAST TECHNIQUE:  Multidetector CT imaging of the chest, abdomen and pelvis was performed following the standard protocol without IV contrast. COMPARISON:  Chest x-ray from same day. CT chest dated October 20, 2016. CT abdomen pelvis dated June 20, 2008. FINDINGS: CT CHEST FINDINGS Cardiovascular: Unchanged mild cardiomegaly with biatrial enlargement. No pericardial effusion. Prior CABG. No thoracic aortic aneurysm. Coronary, aortic arch, and branch vessel atherosclerotic vascular disease. Mediastinum/Nodes: No enlarged mediastinal, hilar, or axillary lymph nodes. Thyroid gland, trachea, and esophagus demonstrate no significant findings. Lungs/Pleura: Peripheral ground-glass density and interstitial thickening in the right upper lobe and both lower lobes. No pleural effusion or pneumothorax. 4 mm pulmonary nodule in the right upper lobe (series 4, image 22), stable since 2017, benign. Musculoskeletal: No chest wall mass or suspicious bone lesions identified. CT ABDOMEN PELVIS FINDINGS Hepatobiliary: No focal liver abnormality. Unchanged small gallstone near the gallbladder neck. No gallbladder wall thickening or biliary dilatation. Pancreas: Unremarkable. No pancreatic ductal dilatation or surrounding inflammatory changes. Spleen: Normal in size without focal abnormality. Adrenals/Urinary Tract: The adrenal glands are unremarkable. Small bilateral renal cysts. 12 mm calculus in the bladder at the left UVJ with resultant moderate left hydroureteronephrosis. Multiple bladder diverticula. Stomach/Bowel: Stomach is within normal limits. Appendix not identified. No evidence of bowel wall thickening, distention, or inflammatory changes. Left-sided colonic diverticulosis. Vascular/Lymphatic: Aortic atherosclerosis. No enlarged abdominal or pelvic lymph nodes. Reproductive: Asymmetrically enlarged prostate gland. Other: No free fluid or pneumoperitoneum. Musculoskeletal: No acute or significant osseous findings. IMPRESSION: Chest: 1.  Peripheral ground-glass density and interstitial thickening in the right upper lobe and both lower lobes, consistent with COVID-19 pneumonia. 2.  Aortic atherosclerosis (ICD10-I70.0). Abdomen and pelvis: 1. 12 mm calculus in the bladder at the left UVJ with resultant moderate left hydroureteronephrosis. 2. Multiple bladder diverticula likely related to chronic outlet obstruction given asymmetrically enlarged prostate gland. Correlation with PSA is recommended given prostate asymmetry. 3. Unchanged cholelithiasis. Electronically Signed   By: Titus Dubin M.D.   On: 01/01/2020 11:40   CT Chest Wo Contrast  Result Date: 01/01/2020 CLINICAL DATA:  Body aches and confusion. Diagnosed with COVID 2 weeks ago. EXAM: CT CHEST, ABDOMEN AND PELVIS WITHOUT CONTRAST TECHNIQUE: Multidetector CT imaging of the chest, abdomen and pelvis was performed following the standard protocol without IV contrast. COMPARISON:  Chest x-ray from same day. CT chest dated October 20, 2016. CT abdomen pelvis dated June 20, 2008. FINDINGS: CT CHEST FINDINGS Cardiovascular: Unchanged mild cardiomegaly with biatrial enlargement. No pericardial effusion. Prior CABG. No thoracic aortic aneurysm. Coronary, aortic arch, and branch vessel atherosclerotic vascular disease. Mediastinum/Nodes: No enlarged mediastinal, hilar, or axillary lymph nodes. Thyroid gland, trachea, and esophagus demonstrate no significant findings. Lungs/Pleura: Peripheral ground-glass density and interstitial thickening in the right upper lobe and both lower lobes. No pleural effusion or pneumothorax. 4 mm pulmonary nodule in the right upper lobe (series 4, image 22), stable since 2017, benign. Musculoskeletal: No chest wall mass or suspicious bone lesions identified. CT ABDOMEN PELVIS FINDINGS Hepatobiliary: No focal liver abnormality. Unchanged small gallstone near the gallbladder neck. No gallbladder  wall thickening or biliary dilatation. Pancreas: Unremarkable. No  pancreatic ductal dilatation or surrounding inflammatory changes. Spleen: Normal in size without focal abnormality. Adrenals/Urinary Tract: The adrenal glands are unremarkable. Small bilateral renal cysts. 12 mm calculus in the bladder at the left UVJ with resultant moderate left hydroureteronephrosis. Multiple bladder diverticula. Stomach/Bowel: Stomach is within normal limits. Appendix not identified. No evidence of bowel wall thickening, distention, or inflammatory changes. Left-sided colonic diverticulosis. Vascular/Lymphatic: Aortic atherosclerosis. No enlarged abdominal or pelvic lymph nodes. Reproductive: Asymmetrically enlarged prostate gland. Other: No free fluid or pneumoperitoneum. Musculoskeletal: No acute or significant osseous findings. IMPRESSION: Chest: 1. Peripheral ground-glass density and interstitial thickening in the right upper lobe and both lower lobes, consistent with COVID-19 pneumonia. 2.  Aortic atherosclerosis (ICD10-I70.0). Abdomen and pelvis: 1. 12 mm calculus in the bladder at the left UVJ with resultant moderate left hydroureteronephrosis. 2. Multiple bladder diverticula likely related to chronic outlet obstruction given asymmetrically enlarged prostate gland. Correlation with PSA is recommended given prostate asymmetry. 3. Unchanged cholelithiasis. Electronically Signed   By: Titus Dubin M.D.   On: 01/01/2020 11:40   DG Chest Port 1 View  Result Date: 01/01/2020 CLINICAL DATA:  COVID.  Weakness EXAM: PORTABLE CHEST 1 VIEW COMPARISON:  09/04/2018 FINDINGS: Streaky opacity at the left base that is stable from prior. No definite acute airspace disease. No edema or effusion. No pneumothorax. Cardiomegaly with CABG. Rightward rotation distorts mediastinal contours. IMPRESSION: No acute finding when accounting for chronic infiltrate at the left base. Electronically Signed   By: Monte Fantasia M.D.   On: 01/01/2020 08:50   DG C-Arm 1-60 Min-No Report  Result Date:  01/01/2020 Fluoroscopy was utilized by the requesting physician.  No radiographic interpretation.     PERTINENT LAB RESULTS: CBC: Recent Labs    01/04/20 0423  WBC 12.2*  HGB 8.1*  HCT 26.7*  PLT 275   CMET CMP     Component Value Date/Time   NA 162 (HH) 01/04/2020 0423   NA 142 12/27/2019 0000   K 3.4 (L) 01/04/2020 0423   CL 129 (H) 01/04/2020 0423   CL 92 02/21/2019 0000   CO2 19 (L) 01/04/2020 0423   CO2 31 02/21/2019 0000   GLUCOSE 336 (H) 01/04/2020 0423   BUN 169 (H) 01/04/2020 0423   BUN 102 (A) 12/27/2019 0000   CREATININE 3.68 (H) 01/04/2020 0423   CALCIUM 9.0 01/04/2020 0423   CALCIUM 9.0 02/21/2019 0000   PROT 6.6 01/04/2020 0423   ALBUMIN 2.3 (L) 01/04/2020 0423   ALBUMIN 3.9 02/02/2019 0000   AST 24 01/04/2020 0423   ALT 30 01/04/2020 0423   ALKPHOS 35 (L) 01/04/2020 0423   BILITOT 1.0 01/04/2020 0423   GFRNONAA 13 (L) 01/04/2020 0423   GFRAA 15 (L) 01/04/2020 0423    GFR Estimated Creatinine Clearance: 12.3 mL/min (A) (by C-G formula based on SCr of 3.68 mg/dL (H)). No results for input(s): LIPASE, AMYLASE in the last 72 hours. No results for input(s): CKTOTAL, CKMB, CKMBINDEX, TROPONINI in the last 72 hours. Invalid input(s): POCBNP Recent Labs    01/04/20 0423  DDIMER 1.10*   No results for input(s): HGBA1C in the last 72 hours. No results for input(s): CHOL, HDL, LDLCALC, TRIG, CHOLHDL, LDLDIRECT in the last 72 hours. No results for input(s): TSH, T4TOTAL, T3FREE, THYROIDAB in the last 72 hours.  Invalid input(s): FREET3 Recent Labs    01/04/20 1133  FERRITIN 240   Coags: No results for input(s): INR in the last 72 hours.  Invalid input(s): PT Microbiology: Recent Results (from the past 240 hour(s))  Respiratory Panel by RT PCR (Flu A&B, Covid) - Nasopharyngeal Swab     Status: Abnormal   Collection Time: 01/01/20  8:27 AM   Specimen: Nasopharyngeal Swab  Result Value Ref Range Status   SARS Coronavirus 2 by RT PCR POSITIVE (A)  NEGATIVE Final    Comment: RESULT CALLED TO, READ BACK BY AND VERIFIED WITH: Talbert Forest RN 11:50 01/01/20 ([email or web address]) (NOTE) SARS-CoV-2 target nucleic acids are DETECTED. SARS-CoV-2 RNA is generally detectable in upper respiratory specimens  during the acute phase of infection. Positive results are indicative of the presence of the identified virus, but do not rule out bacterial infection or co-infection with other pathogens not detected by the test. Clinical correlation with patient history and other diagnostic information is necessary to determine patient infection status. The expected result is Negative. Fact Sheet for Patients:  PinkCheek.be Fact Sheet for Healthcare Providers: GravelBags.it This test is not yet approved or cleared by the [place] FDA and  has been authorized for detection and/or diagnosis of SARS-CoV-2 by FDA under an Emergency Use Authorization (EUA).  This EUA will remain in effect (meaning this test can be used) fo r the duration of  the COVID-19 declaration under Section 564(b)(1) of the Act, 21 U.S.C. section 360bbb-3(b)(1), unless the authorization is terminated or revoked sooner.    Influenza A by PCR NEGATIVE NEGATIVE Final   Influenza B by PCR NEGATIVE NEGATIVE Final    Comment: (NOTE) The Xpert Xpress SARS-CoV-2/FLU/RSV assay is intended as an aid in  the diagnosis of influenza from Nasopharyngeal swab specimens and  should not be used as a sole basis for treatment. Nasal washings and  aspirates are unacceptable for Xpert Xpress SARS-CoV-2/FLU/RSV  testing. Fact Sheet for Patients: PinkCheek.be Fact Sheet for Healthcare Providers: GravelBags.it This test is not yet approved or cleared by the [place] FDA and  has been authorized for detection and/or diagnosis of SARS-CoV-2 by  FDA under an Emergency Use Authorization (EUA). This  EUA will remain  in effect (meaning this test can be used) for the duration of the  Covid-19 declaration under Section 564(b)(1) of the Act, 21  U.S.C. section 360bbb-3(b)(1), unless the authorization is  terminated or revoked. Performed at [facility], [place] [address], [place], [place] [address]   Urine culture     Status: Abnormal   Collection Time: 01/01/20  9:11 AM   Specimen: Urine, Random  Result Value Ref Range Status   Specimen Description URINE, RANDOM  Final   Special Requests   Final    NONE Performed at [facility], [place] [address] [place], [place] [address]    Culture (A)  Final    >=100,000 COLONIES/mL METHICILLIN RESISTANT STAPHYLOCOCCUS AUREUS   Report Status 01/03/2020 FINAL  Final   Organism ID, Bacteria METHICILLIN RESISTANT STAPHYLOCOCCUS AUREUS (A)  Final      Susceptibility   Methicillin resistant staphylococcus aureus - MIC*    CIPROFLOXACIN >=8 RESISTANT Resistant     GENTAMICIN >=16 RESISTANT Resistant     NITROFURANTOIN <=16 SENSITIVE Sensitive     OXACILLIN >=4 RESISTANT Resistant     TETRACYCLINE <=1 SENSITIVE Sensitive     VANCOMYCIN <=0.5 SENSITIVE Sensitive     TRIMETH/SULFA <=10 SENSITIVE Sensitive     CLINDAMYCIN <=0.25 SENSITIVE Sensitive     RIFAMPIN <=0.5 SENSITIVE Sensitive     Inducible Clindamycin NEGATIVE Sensitive     * >=100,000 COLONIES/mL  METHICILLIN RESISTANT STAPHYLOCOCCUS AUREUS  Blood Culture (routine x 2)     Status: None   Collection Time: 01/01/20  9:16 AM   Specimen: BLOOD  Result Value Ref Range Status   Specimen Description BLOOD RIGHT ANTECUBITAL  Final   Special Requests   Final    BOTTLES DRAWN AEROBIC AND ANAEROBIC Blood Culture results may not be optimal due to an excessive volume of blood received in culture bottles Performed at [place] [address], [place], [place] [address]    Culture NO GROWTH 5 DAYS  Final   Report Status Feb 04, 2020 FINAL  Final  Blood Culture (routine x 2)      Status: None   Collection Time: 01/01/20  9:16 AM   Specimen: BLOOD RIGHT HAND  Result Value Ref Range Status   Specimen Description BLOOD RIGHT HAND  Final   Special Requests   Final    BOTTLES DRAWN AEROBIC AND ANAEROBIC Blood Culture results may not be optimal due to an excessive volume of blood received in culture bottles Performed at [facility], [place] [address] [place], [place] [address]    Culture NO GROWTH 5 DAYS  Final   Report Status Feb 04, 2020 FINAL  Final    FURTHER DISCHARGE INSTRUCTIONS:  Get Medicines reviewed and adjusted: Please take all your medications with you for your next visit with your Primary MD  Laboratory/radiological data: Please request your Primary MD to go over all hospital tests and procedure/radiological results at the follow up, please ask your Primary MD to get all Hospital records sent to his/her office.  In some cases, they will be blood work, cultures and biopsy results pending at the time of your discharge. Please request that your primary care M.D. goes through all the records of your hospital data and follows up on these results.  Also Note the following: If you experience worsening of your admission symptoms, develop shortness of breath, life threatening emergency, suicidal or homicidal thoughts you must seek medical attention immediately by calling 911 or calling your MD immediately  if symptoms less severe.  You must read complete instructions/literature along with all the possible adverse reactions/side effects for all the Medicines you take and that have been prescribed to you. Take any new Medicines after you have completely understood and accpet all the possible adverse reactions/side effects.   Do not drive when taking Pain medications or sleeping medications (Benzodaizepines)  Do not take more than prescribed Pain, Sleep and Anxiety Medications. It is not advisable to combine anxiety,sleep and pain medications without talking with  your primary care practitioner  Special Instructions: If you have smoked or chewed Tobacco  in the last 2 yrs please stop smoking, stop any regular Alcohol  and or any Recreational drug use.  Wear Seat belts while driving.  Please note: You were cared for by a hospitalist during your hospital stay. Once you are discharged, your primary care physician will handle any further medical issues. Please note that NO REFILLS for any discharge medications will be authorized once you are discharged, as it is imperative that you return to your primary care physician (or establish a relationship with a primary care physician if you do not have one) for your post hospital discharge needs so that they can reassess your need for medications and monitor your lab values.  Total Time spent coordinating discharge including counseling, education and face to face time equals 35 minutes.  SignedOren Binet 2020-02-04 8:26 AM

## 2020-01-05 NOTE — Progress Notes (Signed)
Spoke to pt.'s daughter Layla Maw and updated her about the pt.'s current status, all questions answered. This RN offered her to [facility] later this afternoon  with the patient being she lives out of state ( [place]) & is unable to come and see her Dad today; Pt.'s son Daiven Winterrowd also updated about the plan of care and was able to visit the pt. today

## 2020-01-05 NOTE — Progress Notes (Addendum)
PROGRESS NOTE                                                                                                                                                                                                             Patient Demographics:    Cole [name], is a 86 y.o. male, DOB - December 03, 1919, [id number]  Outpatient Primary MD for the patient is [name], MD   Admit date - 01/01/2020   LOS - 4  Chief Complaint  Patient presents with  . Shortness of Breath  . Failure To Thrive       Brief Narrative: Patient is a 86 y.o. male with PMHx of dementia, chronic combined systolic/diastolic heart failure, CAD, DM, HTN, CKD stage IV who apparently was diagnosed with COVID-19 approximately 2 weeks prior to this hospital stay-presented to the hospital with confusion, and poor oral intake-monitor acute metabolic encephalopathy secondary to AKI with left hydronephrosis due to a distal ureteral stone.  Patient was admitted to the hospitalist service for further evaluation and treatment.  Unfortunately-even with treatment with IV fluids, antibiotics, urology evaluation with cystoscopy-patient continued to deteriorate-continue to have persistent AKI with severe hyponatremia with no oral intake.  After discussion with family-patient was transitioned to full comfort measures on 2/18.   Subjective:   Appears comfortable-on Dilaudid infusion.   Assessment  & Plan :   Left hydronephrosis with 1.2 cm distal ureteral stone, bladder outlet obstruction: S/p cystoscopy on 2/15-Foley catheter in place-although remains relatively stable from this point of view-has developed severe AKI/hypernatremia-after discussion with family-has been transitioned to full comfort measures on 2/18.  Keep Foley in place for comfort.    Complicated UTI: Pus seen in the bladder on cystoscopy-urine culture positive for MRSA negative so far-started on Zyvox-given  continued deterioration with persistent AKI/hypernatremia/poor oral intake-transitioned to comfort measures on 2/18-stop Zyvox.   Acute metabolic encephalopathy superimposed on dementia: Secondary to AKI, hypernatremia complicated UTI.  Persistently confused--intact more agitated today-no oral intake for more than 2 days-transitioned to full comfort measures.  AKI on CKD stage IV: AKI multifactorial-likely secondary to obstructive uropathy and some amount of prerenal physiology from poor oral intake.  Renal function somewhat improved but has deteriorated again today.  Per prior notes-family did not wish to pursue HD.  Long discussion with family again on 2/18-explained continued clinical deterioration-no oral  intake-family agreeable to transition to full comfort measures on 2/18.  Hypernatremia: Minimal improvement overnight-no oral intake-after discussion with family-transitioned to full comfort measures.  Covid 19 Viral pneumonia: On 1-2 L of oxygen-CRP worsening-patient has been transitioned to full comfort measures-discontinue steroids/remdesivir  Fever: afebrile  O2 requirements:  SpO2: 96 % O2 Flow Rate (L/min): 2 L/min   COVID-19 Labs: Recent Labs    01/03/20 0432 01/04/20 0423 01/04/20 1133  DDIMER 1.66* 1.10*  --   FERRITIN 308  --  240  CRP 7.0*  --  8.8*       Component Value Date/Time   BNP 1,023.7 (H) 01/13/2018 1839    Recent Labs  Lab 01/01/20 0821  PROCALCITON 0.36    Lab Results  Component Value Date   SARSCOV2NAA POSITIVE (A) 01/01/2020     COVID-19 Medications: Steroids:2/15>>2/18 Remdesivir:2/15>>2/18  DVT Prophylaxis  : Not needed-as comfort measures  DM-2: CBGs stable-have discontinued SSI and CBG monitoring-as comfort measures in place.  CBG (last 3)  Recent Labs    01/03/20 2222 01/04/20 0804 01/04/20 1138  GLUCAP 238* 289* 282*    Chronic combined systolic/diastolic heart failure: Volume status stable  HTN: Controlled-stop  Imdur/metoprolol  Goals of care: DNR in place-spoke with son/daughter (conference call) on 2/18-explained continued deterioration-no oral intake-severe agitation-recommended that at this point apart from comfort measures-really no good alternative.Family understanding of the situation-after extensive discussion-have transition to full comfort measures.  Nutrition Problem: Nutrition Problem: Inadequate oral intake Etiology: inability to eat Signs/Symptoms: NPO status Interventions: Refer to RD note for recommendations  Consults  :  Urology  Procedures  :  Cystoscopy with left ureteral catheterization-insertion of Foley catheter by Dr. Jeffie Pollock on 2/15  ABG:    Component Value Date/Time   PHART 7.453 (H) 01/02/2010 0902   PCO2ART 42.9 01/02/2010 0902   PO2ART 57.0 (L) 01/02/2010 0902   HCO3 30.0 (H) 01/02/2010 0902   TCO2 25 10/20/2016 0046   O2SAT 90.0 01/02/2010 0902    Vent Settings: N/A    Condition - Extremely Guarded  Family Communication  :  Son updated on 2/19  Code Status : DNR  Diet :  Diet Order            Diet full liquid Room service appropriate? Yes; Fluid consistency: Thin  Diet effective now               Disposition Plan  : Residential hospice-social worker consulted today.  Barriers to discharge: Comfort measures-on IV Dilaudid infusion-social work consulted on 2/19 for residential hospice bed.  Antimicorbials  :    Anti-infectives (From admission, onward)   Start     Dose/Rate Route Frequency Ordered Stop   01/03/20 1100  linezolid (ZYVOX) tablet 600 mg  Status:  Discontinued     600 mg Oral Every 12 hours 01/03/20 1049 01/04/20 1434   01/02/20 1000  remdesivir 100 mg in sodium chloride 0.9 % 100 mL IVPB  Status:  Discontinued     100 mg 200 mL/hr over 30 Minutes Intravenous Daily 01/01/20 1243 01/01/20 1325   01/02/20 1000  cefTRIAXone (ROCEPHIN) 1 g in sodium chloride 0.9 % 100 mL IVPB  Status:  Discontinued     1 g 200 mL/hr over 30 Minutes  Intravenous Every 24 hours 01/01/20 1244 01/03/20 1049   01/02/20 1000  remdesivir 100 mg in sodium chloride 0.9 % 100 mL IVPB  Status:  Discontinued     100 mg 200 mL/hr over 30 Minutes Intravenous Daily 01/01/20  1418 01/04/20 1434   01/01/20 1700  cefTRIAXone (ROCEPHIN) 1 g in sodium chloride 0.9 % 100 mL IVPB  Status:  Discontinued     1 g 200 mL/hr over 30 Minutes Intravenous  Once 01/01/20 1632 01/01/20 1758   01/01/20 1430  remdesivir 200 mg in sodium chloride 0.9% 250 mL IVPB     200 mg 580 mL/hr over 30 Minutes Intravenous Once 01/01/20 1418 01/01/20 1555   01/01/20 1330  remdesivir 200 mg in sodium chloride 0.9% 250 mL IVPB  Status:  Discontinued     200 mg 580 mL/hr over 30 Minutes Intravenous Once 01/01/20 1243 01/01/20 1325   01/01/20 1100  cefTRIAXone (ROCEPHIN) 1 g in sodium chloride 0.9 % 100 mL IVPB     1 g 200 mL/hr over 30 Minutes Intravenous  Once 01/01/20 1013 01/01/20 1118      Inpatient Medications  Scheduled Meds:  Continuous Infusions: . HYDROmorphone 0.5 mg/hr (01/04/20 1513)   PRN Meds:.atropine, diphenhydrAMINE, haloperidol **OR** haloperidol **OR** haloperidol lactate, HYDROmorphone, LORazepam **OR** LORazepam **OR** LORazepam, LORazepam, ondansetron **OR** ondansetron (ZOFRAN) IV   Time Spent in minutes  15   See all Orders from today for further details   Oren Binet M.D on 01/05/2020 at 12:48 PM  To page go to [email or web address] - use universal password  [facility] -  Office  [phone]    Objective:   Vitals:   01/03/20 2013 01/04/20 0347 01/04/20 1357 01/04/20 1948  BP: 136/63 (!) 146/66 (!) 128/50 (!) 136/47  Pulse: 81 80  77  Resp: 18 16  (!) 22  Temp: 98.6 F (37 C) 98.1 F (36.7 C)  97.6 F (36.4 C)  TempSrc: Oral Oral  Oral  SpO2: 95% 96%  96%  Weight:  86.1 kg    Height:        Wt Readings from Last 3 Encounters:  01/04/20 86.1 kg  12/28/19 77.7 kg  12/26/19 77.7 kg    No intake or output data in the 24  hours ending 01/05/20 1248   Physical Exam Looks comfortable-not in any distress.   Data Review:    CBC Recent Labs  Lab 01/01/20 0821 01/02/20 0346 01/03/20 0432 01/04/20 0423  WBC 15.0* 9.3 9.2 12.2*  HGB 10.7* 8.5* 9.5* 8.1*  HCT 33.9* 27.2* 29.7* 26.7*  PLT 233 211 PLATELET CLUMPS NOTED ON SMEAR, UNABLE TO ESTIMATE 275  MCV 94.2 94.1 93.4 97.1  MCH 29.7 29.4 29.9 29.5  MCHC 31.6 31.3 32.0 30.3  RDW 14.1 14.1 14.4 14.6  LYMPHSABS 0.8 0.6* 0.9 1.3  MONOABS 0.7 0.5 0.5 0.6  EOSABS 0.0 0.0 0.0 0.0  BASOSABS 0.0 0.0 0.0 0.0    Chemistries  Recent Labs  Lab 01/01/20 0821 01/02/20 0346 01/03/20 0432 01/04/20 0423  NA 148* 154* 165* 162*  K 5.4* 4.2 4.1 3.4*  CL 112* 118* 130* 129*  CO2 19* 19* 18* 19*  GLUCOSE 236* 257* 201* 336*  BUN 201* 177* 174* 169*  CREATININE 4.24* 3.67* 3.63* 3.68*  CALCIUM 10.0 9.2 9.6 9.0  MG  --  2.4 2.6* 2.6*  AST 56* 35 30 24  ALT 63* 45* 37 30  ALKPHOS 40 34* 35* 35*  BILITOT 1.1 0.8 1.2 1.0   ------------------------------------------------------------------------------------------------------------------ No results for input(s): CHOL, HDL, LDLCALC, TRIG, CHOLHDL, LDLDIRECT in the last 72 hours.  Lab Results  Component Value Date   HGBA1C 8.2 (H) 01/01/2020   ------------------------------------------------------------------------------------------------------------------ No results for input(s): TSH, T4TOTAL, T3FREE, THYROIDAB in the last 72  hours.  Invalid input(s): FREET3 ------------------------------------------------------------------------------------------------------------------ Recent Labs    01/03/20 0432 01/04/20 1133  FERRITIN 308 240    Coagulation profile No results for input(s): INR, PROTIME in the last 168 hours.  Recent Labs    01/03/20 0432 01/04/20 0423  DDIMER 1.66* 1.10*    Cardiac Enzymes No results for input(s): CKMB, TROPONINI, MYOGLOBIN in the last 168 hours.  Invalid input(s):  CK ------------------------------------------------------------------------------------------------------------------    Component Value Date/Time   BNP 1,023.7 (H) 01/13/2018 1839    Micro Results Recent Results (from the past 240 hour(s))  Respiratory Panel by RT PCR (Flu A&B, Covid) - Nasopharyngeal Swab     Status: Abnormal   Collection Time: 01/01/20  8:27 AM   Specimen: Nasopharyngeal Swab  Result Value Ref Range Status   SARS Coronavirus 2 by RT PCR POSITIVE (A) NEGATIVE Final    Comment: RESULT CALLED TO, READ BACK BY AND VERIFIED WITH: Talbert Forest RN 11:50 01/01/20 ([email or web address]) (NOTE) SARS-CoV-2 target nucleic acids are DETECTED. SARS-CoV-2 RNA is generally detectable in upper respiratory specimens  during the acute phase of infection. Positive results are indicative of the presence of the identified virus, but do not rule out bacterial infection or co-infection with other pathogens not detected by the test. Clinical correlation with patient history and other diagnostic information is necessary to determine patient infection status. The expected result is Negative. Fact Sheet for Patients:  PinkCheek.be Fact Sheet for Healthcare Providers: GravelBags.it This test is not yet approved or cleared by the [place] FDA and  has been authorized for detection and/or diagnosis of SARS-CoV-2 by FDA under an Emergency Use Authorization (EUA).  This EUA will remain in effect (meaning this test can be used) fo r the duration of  the COVID-19 declaration under Section 564(b)(1) of the Act, 21 U.S.C. section 360bbb-3(b)(1), unless the authorization is terminated or revoked sooner.    Influenza A by PCR NEGATIVE NEGATIVE Final   Influenza B by PCR NEGATIVE NEGATIVE Final    Comment: (NOTE) The Xpert Xpress SARS-CoV-2/FLU/RSV assay is intended as an aid in  the diagnosis of influenza from Nasopharyngeal swab specimens and  should  not be used as a sole basis for treatment. Nasal washings and  aspirates are unacceptable for Xpert Xpress SARS-CoV-2/FLU/RSV  testing. Fact Sheet for Patients: PinkCheek.be Fact Sheet for Healthcare Providers: GravelBags.it This test is not yet approved or cleared by the [place] FDA and  has been authorized for detection and/or diagnosis of SARS-CoV-2 by  FDA under an Emergency Use Authorization (EUA). This EUA will remain  in effect (meaning this test can be used) for the duration of the  Covid-19 declaration under Section 564(b)(1) of the Act, 21  U.S.C. section 360bbb-3(b)(1), unless the authorization is  terminated or revoked. Performed at [facility], [place] [address] [place], [place] [address]   Urine culture     Status: Abnormal   Collection Time: 01/01/20  9:11 AM   Specimen: Urine, Random  Result Value Ref Range Status   Specimen Description URINE, RANDOM  Final   Special Requests   Final    NONE Performed at [facility], [place] [address] [place], [place] [address]    Culture (A)  Final    >=100,000 COLONIES/mL METHICILLIN RESISTANT STAPHYLOCOCCUS AUREUS   Report Status 01/03/2020 FINAL  Final   Organism ID, Bacteria METHICILLIN RESISTANT STAPHYLOCOCCUS AUREUS (A)  Final      Susceptibility   Methicillin resistant staphylococcus aureus - MIC*  CIPROFLOXACIN >=8 RESISTANT Resistant     GENTAMICIN >=16 RESISTANT Resistant     NITROFURANTOIN <=16 SENSITIVE Sensitive     OXACILLIN >=4 RESISTANT Resistant     TETRACYCLINE <=1 SENSITIVE Sensitive     VANCOMYCIN <=0.5 SENSITIVE Sensitive     TRIMETH/SULFA <=10 SENSITIVE Sensitive     CLINDAMYCIN <=0.25 SENSITIVE Sensitive     RIFAMPIN <=0.5 SENSITIVE Sensitive     Inducible Clindamycin NEGATIVE Sensitive     * >=100,000 COLONIES/mL METHICILLIN RESISTANT STAPHYLOCOCCUS AUREUS  Blood Culture (routine x 2)     Status: None (Preliminary result)    Collection Time: 01/01/20  9:16 AM   Specimen: BLOOD  Result Value Ref Range Status   Specimen Description BLOOD RIGHT ANTECUBITAL  Final   Special Requests   Final    BOTTLES DRAWN AEROBIC AND ANAEROBIC Blood Culture results may not be optimal due to an excessive volume of blood received in culture bottles   Culture   Final    NO GROWTH 4 DAYS Performed at [facility], [place] [address] [place], [place] [address]    Report Status PENDING  Incomplete  Blood Culture (routine x 2)     Status: None (Preliminary result)   Collection Time: 01/01/20  9:16 AM   Specimen: BLOOD RIGHT HAND  Result Value Ref Range Status   Specimen Description BLOOD RIGHT HAND  Final   Special Requests   Final    BOTTLES DRAWN AEROBIC AND ANAEROBIC Blood Culture results may not be optimal due to an excessive volume of blood received in culture bottles   Culture   Final    NO GROWTH 4 DAYS Performed at [facility], [place] [address] [place], [place] [address]    Report Status PENDING  Incomplete    Radiology Reports CT ABDOMEN PELVIS WO CONTRAST  Result Date: 01/01/2020 CLINICAL DATA:  Body aches and confusion. Diagnosed with COVID 2 weeks ago. EXAM: CT CHEST, ABDOMEN AND PELVIS WITHOUT CONTRAST TECHNIQUE: Multidetector CT imaging of the chest, abdomen and pelvis was performed following the standard protocol without IV contrast. COMPARISON:  Chest x-ray from same day. CT chest dated October 20, 2016. CT abdomen pelvis dated June 20, 2008. FINDINGS: CT CHEST FINDINGS Cardiovascular: Unchanged mild cardiomegaly with biatrial enlargement. No pericardial effusion. Prior CABG. No thoracic aortic aneurysm. Coronary, aortic arch, and branch vessel atherosclerotic vascular disease. Mediastinum/Nodes: No enlarged mediastinal, hilar, or axillary lymph nodes. Thyroid gland, trachea, and esophagus demonstrate no significant findings. Lungs/Pleura: Peripheral ground-glass density and interstitial thickening in the  right upper lobe and both lower lobes. No pleural effusion or pneumothorax. 4 mm pulmonary nodule in the right upper lobe (series 4, image 22), stable since 2017, benign. Musculoskeletal: No chest wall mass or suspicious bone lesions identified. CT ABDOMEN PELVIS FINDINGS Hepatobiliary: No focal liver abnormality. Unchanged small gallstone near the gallbladder neck. No gallbladder wall thickening or biliary dilatation. Pancreas: Unremarkable. No pancreatic ductal dilatation or surrounding inflammatory changes. Spleen: Normal in size without focal abnormality. Adrenals/Urinary Tract: The adrenal glands are unremarkable. Small bilateral renal cysts. 12 mm calculus in the bladder at the left UVJ with resultant moderate left hydroureteronephrosis. Multiple bladder diverticula. Stomach/Bowel: Stomach is within normal limits. Appendix not identified. No evidence of bowel wall thickening, distention, or inflammatory changes. Left-sided colonic diverticulosis. Vascular/Lymphatic: Aortic atherosclerosis. No enlarged abdominal or pelvic lymph nodes. Reproductive: Asymmetrically enlarged prostate gland. Other: No free fluid or pneumoperitoneum. Musculoskeletal: No acute or significant osseous findings. IMPRESSION: Chest: 1. Peripheral ground-glass  density and interstitial thickening in the right upper lobe and both lower lobes, consistent with COVID-19 pneumonia. 2.  Aortic atherosclerosis (ICD10-I70.0). Abdomen and pelvis: 1. 12 mm calculus in the bladder at the left UVJ with resultant moderate left hydroureteronephrosis. 2. Multiple bladder diverticula likely related to chronic outlet obstruction given asymmetrically enlarged prostate gland. Correlation with PSA is recommended given prostate asymmetry. 3. Unchanged cholelithiasis. Electronically Signed   By: Titus Dubin M.D.   On: 01/01/2020 11:40   CT Chest Wo Contrast  Result Date: 01/01/2020 CLINICAL DATA:  Body aches and confusion. Diagnosed with COVID 2 weeks  ago. EXAM: CT CHEST, ABDOMEN AND PELVIS WITHOUT CONTRAST TECHNIQUE: Multidetector CT imaging of the chest, abdomen and pelvis was performed following the standard protocol without IV contrast. COMPARISON:  Chest x-ray from same day. CT chest dated October 20, 2016. CT abdomen pelvis dated June 20, 2008. FINDINGS: CT CHEST FINDINGS Cardiovascular: Unchanged mild cardiomegaly with biatrial enlargement. No pericardial effusion. Prior CABG. No thoracic aortic aneurysm. Coronary, aortic arch, and branch vessel atherosclerotic vascular disease. Mediastinum/Nodes: No enlarged mediastinal, hilar, or axillary lymph nodes. Thyroid gland, trachea, and esophagus demonstrate no significant findings. Lungs/Pleura: Peripheral ground-glass density and interstitial thickening in the right upper lobe and both lower lobes. No pleural effusion or pneumothorax. 4 mm pulmonary nodule in the right upper lobe (series 4, image 22), stable since 2017, benign. Musculoskeletal: No chest wall mass or suspicious bone lesions identified. CT ABDOMEN PELVIS FINDINGS Hepatobiliary: No focal liver abnormality. Unchanged small gallstone near the gallbladder neck. No gallbladder wall thickening or biliary dilatation. Pancreas: Unremarkable. No pancreatic ductal dilatation or surrounding inflammatory changes. Spleen: Normal in size without focal abnormality. Adrenals/Urinary Tract: The adrenal glands are unremarkable. Small bilateral renal cysts. 12 mm calculus in the bladder at the left UVJ with resultant moderate left hydroureteronephrosis. Multiple bladder diverticula. Stomach/Bowel: Stomach is within normal limits. Appendix not identified. No evidence of bowel wall thickening, distention, or inflammatory changes. Left-sided colonic diverticulosis. Vascular/Lymphatic: Aortic atherosclerosis. No enlarged abdominal or pelvic lymph nodes. Reproductive: Asymmetrically enlarged prostate gland. Other: No free fluid or pneumoperitoneum. Musculoskeletal: No  acute or significant osseous findings. IMPRESSION: Chest: 1. Peripheral ground-glass density and interstitial thickening in the right upper lobe and both lower lobes, consistent with COVID-19 pneumonia. 2.  Aortic atherosclerosis (ICD10-I70.0). Abdomen and pelvis: 1. 12 mm calculus in the bladder at the left UVJ with resultant moderate left hydroureteronephrosis. 2. Multiple bladder diverticula likely related to chronic outlet obstruction given asymmetrically enlarged prostate gland. Correlation with PSA is recommended given prostate asymmetry. 3. Unchanged cholelithiasis. Electronically Signed   By: Titus Dubin M.D.   On: 01/01/2020 11:40   DG Chest Port 1 View  Result Date: 01/01/2020 CLINICAL DATA:  COVID.  Weakness EXAM: PORTABLE CHEST 1 VIEW COMPARISON:  09/04/2018 FINDINGS: Streaky opacity at the left base that is stable from prior. No definite acute airspace disease. No edema or effusion. No pneumothorax. Cardiomegaly with CABG. Rightward rotation distorts mediastinal contours. IMPRESSION: No acute finding when accounting for chronic infiltrate at the left base. Electronically Signed   By: Monte Fantasia M.D.   On: 01/01/2020 08:50   DG C-Arm 1-60 Min-No Report  Result Date: 01/01/2020 Fluoroscopy was utilized by the requesting physician.  No radiographic interpretation.

## 2020-01-05 NOTE — TOC Progression Note (Signed)
Transition of Care [facility]) - Progression Note    Patient Details  Name: Cole [name] MRN: [id number] Date of Birth: August 10, 1920  Transition of Care [facility]) CM/SW [place], LCSW Phone Number: 01/05/2020, 5:10 PM  Clinical Narrative:    Patient's son requesting transport tomorrow to [facility]. MD aware.    RN to call report to the Admissions Phone tomorrow- [phone]     Barriers to Discharge: Other (comment)(Hospice pending COVID bed)  Expected Discharge Plan and Services   In-house Referral: Hospice / [place] arrangements for the past 2 months: [place] Expected Discharge Date: 01/05/20                                     Social Determinants of Health (SDOH) Interventions    Readmission Risk Interventions No flowsheet data found.

## 2020-01-06 DIAGNOSIS — N390 Urinary tract infection, site not specified: Secondary | ICD-10-CM

## 2020-01-06 DIAGNOSIS — R4182 Altered mental status, unspecified: Secondary | ICD-10-CM

## 2020-01-06 DIAGNOSIS — N179 Acute kidney failure, unspecified: Secondary | ICD-10-CM

## 2020-01-06 DIAGNOSIS — N189 Chronic kidney disease, unspecified: Secondary | ICD-10-CM

## 2020-01-06 DIAGNOSIS — N132 Hydronephrosis with renal and ureteral calculous obstruction: Secondary | ICD-10-CM

## 2020-01-06 LAB — CULTURE, BLOOD (ROUTINE X 2)
Culture: NO GROWTH
Culture: NO GROWTH

## 2020-01-15 NOTE — Progress Notes (Signed)
Blood pressure (!) 149/48, pulse 81, temperature 98.5 F (36.9 C), temperature source Oral, resp. rate 20, height 5\' 8"  (1.727 m), weight 86.1 kg, SpO2 98 %.  Seen and examined-lying comfortably-appears comfortable.  Stable for discharge to residential hospice today.  Discharge summary from 2/19 is up-to-date-no change in medications.

## 2020-01-15 NOTE — TOC Transition Note (Signed)
Transition of Care [facility]) - CM/SW Discharge Note   Patient Details  Name: Cole [name] MRN: [id number] Date of Birth: 1920/07/01  Transition of Care [facility]) CM/SW Contact:  Gabrielle Dare Phone Number: 2020/02/03, 9:46 AM   Clinical Narrative:     Patient will Discharge To: [facility] Anticipated DC Date:Feb 03, 2020 Family Notified: yes, son Cherif Vereb Transport [id number]   Per MD patient ready for DC to [facility] . RN, patient, patient's family, and facility notified of DC.  Discharge Summary sent to facility. RN given number for report [phone]). DC packet on chart. Ambulance transport requested for patient.   CSW signing off.  Reed Breech LCSWA [phone]      Barriers to Discharge: Other (comment)(Hospice pending COVID bed)   Patient Goals and CMS Choice Patient states their goals for this hospitalization and ongoing recovery are:: Comfort CMS Medicare.gov Compare Post Acute Care list provided to:: (Son and daughter)    Discharge Placement                       Discharge Plan and Services In-house Referral: Hospice / [place] Determinants of Health (SDOH) Interventions     Readmission Risk Interventions No flowsheet data found.

## 2020-01-15 DEATH — deceased

## 2021-06-13 ENCOUNTER — Other Ambulatory Visit (HOSPITAL_BASED_OUTPATIENT_CLINIC_OR_DEPARTMENT_OTHER): Payer: Self-pay
